# Patient Record
Sex: Male | Born: 1945 | Race: Black or African American | Hispanic: No | Marital: Married | Smoking: Former smoker
Health system: Southern US, Community
[De-identification: ages and names within clinical notes are randomized; demographics above are authoritative.]

## PROBLEM LIST (undated history)

## (undated) DIAGNOSIS — Z8719 Personal history of other diseases of the digestive system: Secondary | ICD-10-CM

## (undated) DIAGNOSIS — R911 Solitary pulmonary nodule: Secondary | ICD-10-CM

## (undated) DIAGNOSIS — D494 Neoplasm of unspecified behavior of bladder: Secondary | ICD-10-CM

## (undated) DIAGNOSIS — S5290XA Unspecified fracture of unspecified forearm, initial encounter for closed fracture: Secondary | ICD-10-CM

## (undated) DIAGNOSIS — Z8669 Personal history of other diseases of the nervous system and sense organs: Secondary | ICD-10-CM

## (undated) DIAGNOSIS — IMO0001 Reserved for inherently not codable concepts without codable children: Secondary | ICD-10-CM

## (undated) DIAGNOSIS — Z5111 Encounter for antineoplastic chemotherapy: Secondary | ICD-10-CM

## (undated) DIAGNOSIS — IMO0002 Reserved for concepts with insufficient information to code with codable children: Secondary | ICD-10-CM

## (undated) DIAGNOSIS — R319 Hematuria, unspecified: Secondary | ICD-10-CM

## (undated) DIAGNOSIS — C801 Malignant (primary) neoplasm, unspecified: Secondary | ICD-10-CM

## (undated) HISTORY — DX: Encounter for antineoplastic chemotherapy: Z51.11

## (undated) HISTORY — PX: ESOPHAGOGASTRODUODENOSCOPY: SHX1529

## (undated) HISTORY — DX: Reserved for concepts with insufficient information to code with codable children: IMO0002

## (undated) HISTORY — DX: Reserved for inherently not codable concepts without codable children: IMO0001

---

## 1991-05-05 HISTORY — PX: OTHER SURGICAL HISTORY: SHX169

## 2008-06-25 ENCOUNTER — Ambulatory Visit: Payer: Self-pay | Admitting: Internal Medicine

## 2008-06-25 DIAGNOSIS — R569 Unspecified convulsions: Secondary | ICD-10-CM

## 2008-07-09 ENCOUNTER — Ambulatory Visit: Payer: Self-pay | Admitting: Gastroenterology

## 2008-07-20 ENCOUNTER — Telehealth: Payer: Self-pay | Admitting: Gastroenterology

## 2010-04-15 ENCOUNTER — Emergency Department (HOSPITAL_COMMUNITY)
Admission: EM | Admit: 2010-04-15 | Discharge: 2010-04-15 | Payer: Self-pay | Source: Home / Self Care | Admitting: Emergency Medicine

## 2010-04-15 ENCOUNTER — Inpatient Hospital Stay (HOSPITAL_COMMUNITY)
Admission: AD | Admit: 2010-04-15 | Discharge: 2010-04-18 | Payer: Self-pay | Source: Home / Self Care | Attending: Internal Medicine | Admitting: Internal Medicine

## 2010-04-15 ENCOUNTER — Ambulatory Visit: Payer: Self-pay | Admitting: Internal Medicine

## 2010-04-16 ENCOUNTER — Encounter: Payer: Self-pay | Admitting: Gastroenterology

## 2010-04-18 ENCOUNTER — Encounter: Payer: Self-pay | Admitting: Gastroenterology

## 2010-06-03 ENCOUNTER — Ambulatory Visit
Admission: RE | Admit: 2010-06-03 | Discharge: 2010-06-03 | Payer: Self-pay | Source: Home / Self Care | Attending: Gastroenterology | Admitting: Gastroenterology

## 2010-06-03 ENCOUNTER — Other Ambulatory Visit: Payer: Self-pay | Admitting: Gastroenterology

## 2010-06-03 ENCOUNTER — Encounter (INDEPENDENT_AMBULATORY_CARE_PROVIDER_SITE_OTHER): Payer: Self-pay | Admitting: *Deleted

## 2010-06-03 LAB — CBC WITH DIFFERENTIAL/PLATELET
Basophils Absolute: 0 10*3/uL (ref 0.0–0.1)
Basophils Relative: 0.6 % (ref 0.0–3.0)
Eosinophils Absolute: 0 10*3/uL (ref 0.0–0.7)
Lymphocytes Relative: 24.2 % (ref 12.0–46.0)
MCHC: 33.5 g/dL (ref 30.0–36.0)
Neutrophils Relative %: 58.9 % (ref 43.0–77.0)
RBC: 3.29 Mil/uL — ABNORMAL LOW (ref 4.22–5.81)
RDW: 17.8 % — ABNORMAL HIGH (ref 11.5–14.6)

## 2010-06-05 NOTE — Procedures (Signed)
Summary: EGD  ENDOSCOPY PROCEDURE REPORT  PATIENT:  Wesley [name], Wesley [name]  MR#:  [id number] BIRTHDATE:   1945-12-09, 64 yrs. old   GENDER:   male ENDOSCOPIST:   Rachael Fee, MD PROCEDURE DATE:  04/16/2010 PROCEDURE:  EGD with biopsy, 43239, EGD for control of bleeding ASA CLASS:   Class II INDICATIONS: melena, anemia MEDICATIONS:    Fentanyl 50 mcg IV, Versed 7 mg IV DESCRIPTION OF PROCEDURE:   After the risks benefits and alternatives of the procedure were thoroughly explained, informed consent was obtained.  The EG-2990i ([id number]) endoscope was introduced through the mouth and advanced to the second portion of the duodenum, without limitations.  The instrument was slowly withdrawn as the mucosa was fully examined.  <<PROCEDUREIMAGES>>          <<OLD IMAGES>> There was a 1cm cratered ulcer in pyloric channel with a small visible, non-bleeding vessel. The mucosa surrounding the ulcer was edmeatous, inflamed, friable. This caused anatomic distortion of pyrloric channel and partial outlet obstruction. The vessel was injected with 3 cc of dilute epineprine and then Bicap Cautery was applied to destroy the vessel (see image008, image010, and image002).  Mild gastritis was found. There was mild, non-specific gastritis. Biopsies taken to check for H. pylori (see image001).  Otherwise the examination was normal (see image005 and image007).    Retroflexed views revealed no abnormalities.    The scope was then withdrawn from the patient and the procedure completed. COMPLICATIONS:   None  ENDOSCOPIC IMPRESSION:  1) Pyloric channel ulcer with visible vessel.  This was treated with epinephrine injection and cautery.  2) Mild gastritis, biopsied to check for H. pylori  3) Otherwise normal examination  RECOMMENDATIONS:  Continue IV PPI twice daily for now.  Clear liquids.  If biopsies show H. pylori, then he will be started on the appropriate antibtiocs.  _______________________________ Rachael Fee, MD

## 2010-06-05 NOTE — Letter (Signed)
Summary: New Patient letter  [facility]  [address] [place], [place] [address]   Phone: [phone]  Fax: [phone]       04/18/2010 MRN: [id number]  [facility] [address] [place], [place]  [address]  Dear Wesley [name],  Welcome to the Gastroenterology Division at [facility].    You are scheduled to see Dr.  Christella Hartigan on 06-03-10 at 10:30am on the 3rd floor at [facility], [address]  We ask that you try to arrive at our office 15 minutes prior to your appointment time to allow for check-in.  We would like you to complete the enclosed self-administered evaluation form prior to your visit and bring it with you on the day of your appointment.  We will review it with you.  Also, please bring a complete list of all your medications or, if you prefer, bring the medication bottles and we will list them.  Please bring your insurance card so that we may make a copy of it.  If your insurance requires a referral to see a specialist, please bring your referral form from your primary care physician.  Co-payments are due at the time of your visit and may be paid by cash, check or credit card.     Your office visit will consist of a consult with your physician (includes a physical exam), any laboratory testing he/she may order, scheduling of any necessary diagnostic testing (e.g. x-ray, ultrasound, CT-scan), and scheduling of a procedure (e.g. Endoscopy, Colonoscopy) if required.  Please allow enough time on your schedule to allow for any/all of these possibilities.    If you cannot keep your appointment, please call [phone] to cancel or reschedule prior to your appointment date.  This allows [place] the opportunity to schedule an appointment for another patient in need of care.  If you do not cancel or reschedule by 5 p.m. the business day prior to your appointment date, you will be charged a $50.00 late cancellation/no-show fee.    Thank you for choosing [facility] for your medical needs.  We appreciate the opportunity to care for you.  Please visit [place] at our website  to learn more about our practice.                     Sincerely,                                                             The Gastroenterology Division

## 2010-06-05 NOTE — Procedures (Signed)
Summary: Upper Endoscopy  Patient: Wesley [name] Note: All result statuses are Final unless otherwise noted.  Tests: (1) Upper Endoscopy (EGD)   EGD Upper Endoscopy       DONE     [facility]     [address]     [place], [place]  [address]           ENDOSCOPY PROCEDURE REPORT           PATIENT:  Cedar, Ditullio  MR#:  [id number]     BIRTHDATE:  06/12/1945, 64 yrs. old  GENDER:  male     ENDOSCOPIST:  Rachael Fee, MD     PROCEDURE DATE:  04/16/2010     PROCEDURE:  EGD with biopsy, 43239, EGD for control of bleeding     ASA CLASS:  Class II     INDICATIONS:  melena, anemia     MEDICATIONS:   Fentanyl 50 mcg IV, Versed 7 mg IV     DESCRIPTION OF PROCEDURE:   After the risks benefits and     alternatives of the procedure were thoroughly explained, informed     consent was obtained.  The EG-2990i ([id number]) endoscope was     introduced through the mouth and advanced to the second portion of     the duodenum, without limitations.  The instrument was slowly     withdrawn as the mucosa was fully examined.           <<PROCEDUREIMAGES>>     There was a 1cm cratered ulcer in pyloric channel with a small     visible, non-bleeding vessel. The mucosa surrounding the ulcer was     edmeatous, inflamed, friable. This caused anatomic distortion of     pyrloric channel and partial outlet obstruction. The vessel was     injected with 3 cc of dilute epineprine and then Bicap Cautery was     applied to destroy the vessel (see image008, image010, and     image002).  Mild gastritis was found. There was mild, non-specific     gastritis. Biopsies taken to check for H. pylori (see image001).     Otherwise the examination was normal (see image005 and image007).     Retroflexed views revealed no abnormalities.    The scope was then     withdrawn from the patient and the procedure completed.     COMPLICATIONS:  None           ENDOSCOPIC IMPRESSION:     1) Pyloric channel ulcer  with visible vessel.  This was treated     with epinephrine injection and cautery.     2) Mild gastritis, biopsied to check for H. pylori     3) Otherwise normal examination           RECOMMENDATIONS:     Continue IV PPI twice daily for now.     Clear liquids.     If biopsies show H. pylori, then he will be started on the     appropriate antibtiocs.           ______________________________     Rachael Fee, MD           n.     eSIGNED:   Rachael Fee at 04/16/2010 12:56 PM           Roselie Skinner, [id number]  Note: An exclamation mark (!) indicates a result that was not dispersed into the flowsheet. Document Creation  Date: 04/16/2010 1:09 PM _______________________________________________________________________  (1) Order result status: Final Collection or observation date-time: 04/16/2010 12:47 Requested date-time:  Receipt date-time:  Reported date-time:  Referring Physician:   Ordering Physician: Rob Bunting [phone]) Specimen Source:  Source: Launa Grill Order Number: [id number] Lab site:

## 2010-06-05 NOTE — Assessment & Plan Note (Signed)
Summary: er fup//ccm   Vital Signs:  Patient profile:   65 year old male Weight:      196 pounds Temp:     98.9 degrees F oral BP sitting:   92 / 68  (left arm) Cuff size:   regular  Vitals Entered By: Duard Brady LPN (April 15, 2010 1:06 PM) CC: c/o dizziness/dehydration  Is Patient Diabetic? No   CC:  c/o dizziness/dehydration .  History of Present Illness: 64 -year-old  male without a  significant past medical history.  He was stable until 3 days ago when he had the onset of nausea and vomiting.  There was some concern about the blood in the emesis.  Due to dizziness, he was evaluated and [facility] the ED last night where he was under to be hypotensive.  He was treated with IV fluids and discharged at approximately a 6 a.m. since his discharge.  He has had some occasional mild the dizziness.  Hemoglobin and hematocrit was 10.129.4.  He stated that he noted a dark stoo  earlier today following his E. D. discharge.  He was seen today as instructed for follow-up. He denies any history of GI bleeding peptic ulcer disease.  He states he has noticed some mild abdominal discomfort that predated his nausea and vomiting this past weekend.  He does describe some mild the right arm pain, but has not been taking any anti-inflammatory medications. Evaluation in the office revealed orthostatic hypotension and hematest positive stool.  He is admitted with suspected upper GI bleeding.  Allergies: No Known Drug Allergies  Past History:  Past Medical History: Reviewed history from 06/25/2008 and no changes required. Seizure disorder- age 11 or 78 hospitalized age 89 for accidental poisoning  Past Surgical History: benign tumor resection, left axillan  Family History: Reviewed history from 06/25/2008 and no changes required. father died age 20.  Complications of cerumen after disease, history of hypertension    Mother died age 80, congestive heart failure  Five brothers; one  brother died of complications of either colon or prostate cancer one brother died of maximal depth at age 56 one brother with a history of mental illness, requiring multiple hospital admissions throughout his life.  Also died a traumatic death one brother with chronic kidney disease, diabetes, on chronic dialysis  One sister is well  Social History: Reviewed history from 06/25/2008 and no changes required. 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  The patient denies fever, weight loss, weight gain, vision loss, decreased hearing, hoarseness, chest pain, syncope, dyspnea on exertion, peripheral edema, prolonged cough, headaches, hemoptysis, severe indigestion/heartburn, hematuria, incontinence, genital sores, suspicious skin lesions, transient blindness, difficulty walking, depression, unusual weight change, abnormal bleeding, enlarged lymph nodes, angioedema, breast masses, and testicular masses.    Physical Exam  General:  Well-developed,well-nourished,in no acute distress; alert,appropriate and cooperative throughout examination; blood pressure 90 /70 Head:  Normocephalic and atraumatic without obvious abnormalities. No apparent alopecia or balding. Eyes:  No corneal or conjunctival inflammation noted. EOMI. Perrla. Funduscopic exam benign, without hemorrhages, exudates or papilledema. Vision grossly normal. Ears:  External ear exam shows no significant lesions or deformities.  Otoscopic examination reveals clear canals, tympanic membranes are intact bilaterally without bulging, retraction, inflammation or discharge. Hearing is grossly normal bilaterally. Mouth:  Oral mucosa and oropharynx without lesions or exudates.  Teeth in good repair. Neck:  No deformities, masses, or tenderness noted. Chest Wall:  No deformities, masses,  tenderness or gynecomastia noted. Lungs:  Normal respiratory effort, chest expands symmetrically. Lungs are clear to auscultation, no crackles or wheezes. Heart:  Normal rate and regular rhythm. S1 and S2 normal without gallop, murmur, click, rub or other extra sounds.  no tachycardia Abdomen:  Bowel sounds positive,abdomen soft and non-tender without masses, organomegaly or hernias noted. Rectal:  very dark, brown hematest positive stool Msk:  No deformity or scoliosis noted of thoracic or lumbar spine.   Pulses:  R and L carotid,radial,femoral,dorsalis pedis and posterior tibial pulses are full and equal bilaterally Extremities:  No clubbing, cyanosis, edema, or deformity noted with normal full range of motion of all joints.   Neurologic:  alert & oriented X3, cranial nerves II-XII intact, sensation intact to light touch, and gait normal.  alert & oriented X3, cranial nerves II-XII intact, sensation intact to light touch, and gait normal.   Skin:  Intact without suspicious lesions or rashes Cervical Nodes:  No lymphadenopathy noted Axillary Nodes:  No palpable lymphadenopathy Inguinal Nodes:  No significant adenopathy Psych:  Cognition and judgment appear intact. Alert and cooperative with normal attention span and concentration. No apparent delusions, illusions, hallucinations   Impression & Recommendations:  Problem # 1:  HYPOTENSION, ORTHOSTATIC (ICD-458.0)  Problem # 2:  ANEMIA (ICD-285.9)  Problem # 3:  GASTROINTESTINAL HEMORRHAGE (ICD-578.9) will admit  to the hospital for further evaluation and treatment; will type and cross and transfuse if necessary will placed at bed rest and order prompt fluid resuscitation.  A GI consultation will be obtained.  Will place on  PPI therapy  Patient Instructions: 1)  report to [facility] admitting immediately for admission to the hospital   Orders Added: 1)  Est. Patient Level V [[address]]

## 2010-06-11 NOTE — Assessment & Plan Note (Signed)
  Review of gastrointestinal problems: 1. Pyloric channel ulcer, Biopsies H. pylori positive.  Presented with melena, hemoglobin 7.07 April 2010. EGD performed.    History of Present Illness Visit Type: Initial Visit Primary GI MD: Rob Bunting MD Primary Provider: Beverely Low, MD Chief Complaint: Post hospital/ Ulcer History of Present Illness:     very pleasant 65 year old man whom I last saw about 6 weeks ago. Since then he completed H. pylori ulcer abx.  He has been on PPI twice daily.  NO more melena.  Feels very well.            Current Medications (verified): 1)  Pantoprazole Sodium 40 Mg Tbec (Pantoprazole Sodium) .... Take 1 Tablet By Mouth Two Times A Day With Meals  Allergies (verified): No Known Drug Allergies  Vital Signs:  Patient profile:   65 year old male Height:      68 inches Weight:      196 pounds BMI:     29.91 Pulse rate:   80 / minute Pulse rhythm:   regular BP sitting:   132 / 84  (left arm) Cuff size:   regular  Vitals Entered By: June McMurray CMA Duncan Dull) (June 03, 2010 10:39 AM)  Physical Exam  Additional Exam:  Constitutional: generally well appearing Psychiatric: alert and oriented times 3 Abdomen: soft, non-tender, non-distended, normal bowel sounds    Impression & Recommendations:  Problem # 1:  gastric ulcer, H. pylori related we will set him up with repeat endoscopy in 2-3 weeks time to confirm ulcer healing. He will stay on proton pump inhibitor once daily for another 2-3 weeks and if the ulcer is clearly improving I think he can stop the PPI.  Patient Instructions: 1)  You will be scheduled to have an upper endoscopy in mid to late February. 2)  Cut back on the protonix to once daily. 3)  The medication list was reviewed and reconciled.  All changed / newly prescribed medications were explained.  A complete medication list was provided to the patient / caregiver.  Appended Document: Orders  Update/EGD    Clinical Lists Changes  Orders: Added new Test order of EGD (EGD) - Signed

## 2010-06-11 NOTE — Letter (Signed)
Summary: EGD Instructions  [facility]  [address] [place], [place] [address]   Phone: [phone]  Fax: [phone]       Wesley [name]    [birth year]    MRN: [id number]       Procedure Day /Date:07/08/10 TUE     Arrival Time: 9 am     Procedure Time:10 am     Location of Procedure:                    [facility] (4th Floor)    PREPARATION FOR ENDOSCOPY   On 07/08/10 THE DAY OF THE PROCEDURE:  1.   No solid foods, milk or milk products are allowed after midnight the night before your procedure.  2.   Do not drink anything colored red or purple.  Avoid juices with pulp.  No orange juice.  3.  You may drink clear liquids until 8 am, which is 2 hours before your procedure.                                                                                                CLEAR LIQUIDS INCLUDE: Water Jello Ice Popsicles Tea (sugar ok, no milk/cream) Powdered fruit flavored drinks Coffee (sugar ok, no milk/cream) Gatorade Juice: apple, white grape, white cranberry  Lemonade Clear bullion, consomm, broth Carbonated beverages (any kind) Strained chicken noodle soup Hard Candy   MEDICATION INSTRUCTIONS  Unless otherwise instructed, you should take regular prescription medications with a small sip of water as early as possible the morning of your procedure.            OTHER INSTRUCTIONS  You will need a responsible adult at least 63 years of age to accompany you and drive you home.   This person must remain in the waiting room during your procedure.  Wear loose fitting clothing that is easily removed.  Leave jewelry and other valuables at home.  However, you may wish to bring a book to read or an iPod/MP3 player to listen to music as you wait for your procedure to start.  Remove all body piercing jewelry and leave at home.  Total time from sign-in until discharge is approximately 2-3 hours.  You should go home directly after your  procedure and rest.  You can resume normal activities the day after your procedure.  The day of your procedure you should not:   Drive   Make legal decisions   Operate machinery   Drink alcohol   Return to work  You will receive specific instructions about eating, activities and medications before you leave.    The above instructions have been reviewed and explained to me by   _______________________    I fully understand and can verbalize these instructions _____________________________ Date _________

## 2010-07-08 ENCOUNTER — Encounter: Payer: Self-pay | Admitting: Gastroenterology

## 2010-07-08 ENCOUNTER — Other Ambulatory Visit (AMBULATORY_SURGERY_CENTER): Payer: BC Managed Care – PPO | Admitting: Gastroenterology

## 2010-07-08 DIAGNOSIS — K259 Gastric ulcer, unspecified as acute or chronic, without hemorrhage or perforation: Secondary | ICD-10-CM

## 2010-07-08 DIAGNOSIS — R933 Abnormal findings on diagnostic imaging of other parts of digestive tract: Secondary | ICD-10-CM

## 2010-07-08 DIAGNOSIS — D1330 Benign neoplasm of unspecified part of small intestine: Secondary | ICD-10-CM

## 2010-07-14 LAB — CBC
HCT: 25 % — ABNORMAL LOW (ref 39.0–52.0)
Hemoglobin: 8.4 g/dL — ABNORMAL LOW (ref 13.0–17.0)
Hemoglobin: 8.4 g/dL — ABNORMAL LOW (ref 13.0–17.0)
MCV: 89.6 fL (ref 78.0–100.0)
RBC: 2.65 MIL/uL — ABNORMAL LOW (ref 4.22–5.81)
RBC: 2.74 MIL/uL — ABNORMAL LOW (ref 4.22–5.81)
RBC: 2.79 MIL/uL — ABNORMAL LOW (ref 4.22–5.81)
WBC: 5.1 10*3/uL (ref 4.0–10.5)
WBC: 5.2 10*3/uL (ref 4.0–10.5)

## 2010-07-14 LAB — HEMOGLOBIN AND HEMATOCRIT, BLOOD
HCT: 24.6 % — ABNORMAL LOW (ref 39.0–52.0)
Hemoglobin: 8.6 g/dL — ABNORMAL LOW (ref 13.0–17.0)

## 2010-07-15 LAB — CROSSMATCH
ABO/RH(D): B POS
Antibody Screen: NEGATIVE
Unit division: 0
Unit division: 0
Unit division: 0

## 2010-07-15 LAB — COMPREHENSIVE METABOLIC PANEL
ALT: 12 U/L (ref 0–53)
AST: 14 U/L (ref 0–37)
AST: 19 U/L (ref 0–37)
Albumin: 3 g/dL — ABNORMAL LOW (ref 3.5–5.2)
Alkaline Phosphatase: 29 U/L — ABNORMAL LOW (ref 39–117)
Alkaline Phosphatase: 37 U/L — ABNORMAL LOW (ref 39–117)
BUN: 31 mg/dL — ABNORMAL HIGH (ref 6–23)
CO2: 23 mEq/L (ref 19–32)
CO2: 25 mEq/L (ref 19–32)
Chloride: 102 mEq/L (ref 96–112)
Chloride: 108 mEq/L (ref 96–112)
Creatinine, Ser: 1.43 mg/dL (ref 0.4–1.5)
GFR calc Af Amer: >60 mL/min (ref >60)
GFR calc Af Amer: >60 mL/min (ref >60)
GFR calc non Af Amer: 50 mL/min — ABNORMAL LOW (ref >60)
GFR calc non Af Amer: >60 mL/min (ref >60)
Potassium: 3.3 mEq/L — ABNORMAL LOW (ref 3.5–5.1)
Potassium: 4.2 mEq/L (ref 3.5–5.1)
Total Bilirubin: 0.7 mg/dL (ref 0.3–1.2)
Total Bilirubin: 1.1 mg/dL (ref 0.3–1.2)

## 2010-07-15 LAB — RAPID URINE DRUG SCREEN, HOSP PERFORMED
Amphetamines: NOT DETECTED
Barbiturates: NOT DETECTED
Benzodiazepines: NOT DETECTED
Cocaine: NOT DETECTED
Opiates: NOT DETECTED

## 2010-07-15 LAB — CBC
HCT: 29.4 % — ABNORMAL LOW (ref 39.0–52.0)
Hemoglobin: 10.1 g/dL — ABNORMAL LOW (ref 13.0–17.0)
Hemoglobin: 7.2 g/dL — ABNORMAL LOW (ref 13.0–17.0)
MCH: 31.2 pg (ref 26.0–34.0)
MCH: 31.9 pg (ref 26.0–34.0)
MCHC: 34.4 g/dL (ref 30.0–36.0)
Platelets: 135 10*3/uL — ABNORMAL LOW (ref 150–400)
RBC: 2.31 MIL/uL — ABNORMAL LOW (ref 4.22–5.81)
WBC: 5.4 10*3/uL (ref 4.0–10.5)

## 2010-07-15 LAB — DIFFERENTIAL
Basophils Absolute: 0 10*3/uL (ref 0.0–0.1)
Basophils Absolute: 0 10*3/uL (ref 0.0–0.1)
Basophils Relative: 0 % (ref 0–1)
Basophils Relative: 0 % (ref 0–1)
Eosinophils Absolute: 0 10*3/uL (ref 0.0–0.7)
Eosinophils Absolute: 0 10*3/uL (ref 0.0–0.7)
Eosinophils Relative: 0 % (ref 0–5)
Eosinophils Relative: 0 % (ref 0–5)
Lymphocytes Relative: 23 % (ref 12–46)
Lymphocytes Relative: 8 % — ABNORMAL LOW (ref 12–46)
Monocytes Absolute: 0.7 10*3/uL (ref 0.1–1.0)

## 2010-07-15 LAB — GLUCOSE, CAPILLARY
Glucose-Capillary: 122 mg/dL — ABNORMAL HIGH (ref 70–99)
Glucose-Capillary: 128 mg/dL — ABNORMAL HIGH (ref 70–99)

## 2010-07-15 LAB — BASIC METABOLIC PANEL
BUN: 36 mg/dL — ABNORMAL HIGH (ref 6–23)
CO2: 23 mEq/L (ref 19–32)
Calcium: 8 mg/dL — ABNORMAL LOW (ref 8.4–10.5)
Chloride: 110 mEq/L (ref 96–112)
Creatinine, Ser: 1.08 mg/dL (ref 0.4–1.5)
GFR calc Af Amer: >60 mL/min (ref >60)
Glucose, Bld: 117 mg/dL — ABNORMAL HIGH (ref 70–99)

## 2010-07-15 LAB — ABO/RH: ABO/RH(D): B POS

## 2010-07-15 LAB — LIPASE, BLOOD: Lipase: 27 U/L (ref 11–59)

## 2010-07-15 LAB — URINALYSIS, ROUTINE W REFLEX MICROSCOPIC
Bilirubin Urine: NEGATIVE
Ketones, ur: NEGATIVE mg/dL
Nitrite: NEGATIVE
Protein, ur: NEGATIVE mg/dL
Specific Gravity, Urine: 1.02 (ref 1.005–1.030)
Urobilinogen, UA: 1 mg/dL (ref 0.0–1.0)

## 2010-07-15 LAB — HEMOGLOBIN AND HEMATOCRIT, BLOOD
HCT: 22.3 % — ABNORMAL LOW (ref 39.0–52.0)
HCT: 26.4 % — ABNORMAL LOW (ref 39.0–52.0)
Hemoglobin: 8 g/dL — ABNORMAL LOW (ref 13.0–17.0)
Hemoglobin: 9.5 g/dL — ABNORMAL LOW (ref 13.0–17.0)
Hemoglobin: 9.8 g/dL — ABNORMAL LOW (ref 13.0–17.0)

## 2010-07-15 LAB — URINE MICROSCOPIC-ADD ON

## 2010-07-15 NOTE — Procedures (Signed)
Summary: Upper Endoscopy  Patient: Wesley [name] Note: All result statuses are Final unless otherwise noted.  Tests: (1) Upper Endoscopy (EGD)   EGD Upper Endoscopy       DONE     [facility]     [address]     [place], [place]  [address]          ENDOSCOPY PROCEDURE REPORT          PATIENT:  [name], [name]  MR#:  [id number]     BIRTHDATE:  09/03/1945, 64 yrs. old  GENDER:  male     ENDOSCOPIST:  Rachael Fee, MD     PROCEDURE DATE:  07/08/2010     PROCEDURE:  EGD, diagnostic [phone]     ASA CLASS:  Class II     INDICATIONS:  pyloric channel ulcer 04/2010; H. pylori +; was     bleeding     MEDICATIONS:  Fentanyl 50 mcg IV, Versed 7 mg IV     TOPICAL ANESTHETIC:  Exactacain Spray          DESCRIPTION OF PROCEDURE:   After the risks benefits and     alternatives of the procedure were thoroughly explained, informed     consent was obtained.  The LB [id number] [id number] endoscope was     introduced through the mouth and advanced to the second portion of     the duodenum, without limitations.  The instrument was slowly     withdrawn as the mucosa was fully examined.     <<PROCEDUREIMAGES>>     The pyloric channel was anatomically distorted, somewhat     edematous. 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 The     previously seen bleeding pyloric channel ulcer has healed (see     image6 and image5).  Otherwise the examination was normal (see     image3, image2, image1, and image7).    Retroflexed views revealed     no abnormalities.    The scope was then withdrawn from the patient     and the procedure completed.     COMPLICATIONS:  None          ENDOSCOPIC IMPRESSION:     1) Anatomic distortion in pylorus     2) Otherwise normal examination          RECOMMENDATIONS:     OK to stop the protonix, take as needed for GERD symptoms          ______________________________     Rachael Fee, MD          n.     eSIGNED:   Rachael Fee at  07/08/2010 10:21 AM          Roselie Skinner, [phone]  Note: An exclamation mark (!) indicates a result that was not dispersed into the flowsheet. Document Creation Date: 07/08/2010 10:21 AM _______________________________________________________________________  (1) Order result status: Final Collection or observation date-time: 07/08/2010 10:13 Requested date-time:  Receipt date-time:  Reported date-time:  Referring Physician:   Ordering Physician: Rob Bunting [phone]) Specimen Source:  Source: Launa Grill Order Number: [id number] Lab site:

## 2011-09-14 ENCOUNTER — Encounter: Payer: Self-pay | Admitting: Internal Medicine

## 2011-09-14 ENCOUNTER — Ambulatory Visit (INDEPENDENT_AMBULATORY_CARE_PROVIDER_SITE_OTHER): Payer: BC Managed Care – PPO | Admitting: Internal Medicine

## 2011-09-14 VITALS — BP 122/80 | Temp 97.8°F | Wt 199.0 lb

## 2011-09-14 DIAGNOSIS — L923 Foreign body granuloma of the skin and subcutaneous tissue: Secondary | ICD-10-CM

## 2011-09-14 DIAGNOSIS — M795 Residual foreign body in soft tissue: Secondary | ICD-10-CM

## 2011-09-14 NOTE — Patient Instructions (Signed)
Call or return to clinic as needed  if these symptoms worsen or fail to improve as anticipated.  Keep area clean and dry; leave bandage in place for 24 hrs;  Apply antibiotic cream and wrap daily Call for increase pain, redness or drainage

## 2011-09-14 NOTE — Progress Notes (Signed)
  Subjective:    Patient ID: Wesley [name], male    DOB: 07-22-1945, 66 y.o.   MRN: [id number]  HPI  66 y/o patient with 5 weeks h/o splinter in l index finger    Review of Systems  Skin: Positive for rash.       Objective:   Physical Exam  Skin:       1 cm inflam nodule l index finger          Assessment & Plan:   Foreign body reaction l index finger.  After local anesthesia, nodule excised and area explored; after excision, area bleed freely and treated  With electrocautery;  Pressure dressing applied lightly. Sound care discussed

## 2013-03-13 ENCOUNTER — Encounter: Payer: Self-pay | Admitting: Internal Medicine

## 2013-03-13 ENCOUNTER — Ambulatory Visit (INDEPENDENT_AMBULATORY_CARE_PROVIDER_SITE_OTHER): Payer: BC Managed Care – PPO | Admitting: Internal Medicine

## 2013-03-13 VITALS — BP 142/90 | HR 81 | Temp 98.2°F | Resp 20 | Wt 200.0 lb

## 2013-03-13 DIAGNOSIS — Z23 Encounter for immunization: Secondary | ICD-10-CM

## 2013-03-13 DIAGNOSIS — M542 Cervicalgia: Secondary | ICD-10-CM

## 2013-03-13 MED ORDER — METHYLPREDNISOLONE ACETATE 80 MG/ML IJ SUSP
80.0000 mg | Freq: Once | INTRAMUSCULAR | Status: AC
  Administered 2013-03-13: 80 mg via INTRAMUSCULAR

## 2013-03-13 MED ORDER — CYCLOBENZAPRINE HCL 10 MG PO TABS: 10.0000 mg | ORAL_TABLET | Freq: Three times a day (TID) | ORAL | Status: DC | PRN

## 2013-03-13 MED ORDER — MELOXICAM 15 MG PO TABS: 15.0000 mg | ORAL_TABLET | Freq: Every day | ORAL | Status: DC

## 2013-03-13 NOTE — Progress Notes (Signed)
  Subjective:    Patient ID: Wesley [name], male    DOB: [birth year], 67 y.o.   MRN: [id number]  HPI  Pre-visit discussion using our clinic review tool. No additional management support is needed unless otherwise documented below in the visit note.   67 year old patient who is seen today with a chief complaint of right posterior neck pain of 7 days duration. This began after doing some unusual exertional activities. Pain is maximal in the right posterior neck and most aggravated by head turning to the right. No radicular symptoms. He describes some diffuse tightness and the entire posterior neck region. Symptoms are worse in the morning when he first awakens. He has been using anti-inflammatories with marginal results. No prior history of neck pain  History reviewed. No pertinent past medical history.  History   Social History  . Marital Status: Married    Spouse Name: N/A    Number of Children: N/A  . Years of Education: N/A   Occupational History  . Not on file.   Social History Main Topics  . Smoking status: Never Smoker   . Smokeless tobacco: Never Used  . Alcohol Use: No  . Drug Use: No  . Sexual Activity: Not on file   Other Topics Concern  . Not on file   Social History Narrative  . No narrative on file    History reviewed. No pertinent past surgical history.  No family history on file.  No Known Allergies  No current outpatient prescriptions on file prior to visit.   No current facility-administered medications on file prior to visit.    BP 142/90  Pulse 81  Temp(Src) 98.2 F (36.8 C) (Oral)  Resp 20  Wt 200 lb (90.719 kg)  SpO2 97%       Review of Systems  Constitutional: Negative for fever, chills, appetite change and fatigue.  HENT: Negative for congestion, dental problem, ear pain, hearing loss, sore throat, tinnitus, trouble swallowing and voice change.   Eyes: Negative for pain, discharge and visual disturbance.  Respiratory: Negative for  cough, chest tightness, wheezing and stridor.   Cardiovascular: Negative for chest pain, palpitations and leg swelling.  Gastrointestinal: Negative for nausea, vomiting, abdominal pain, diarrhea, constipation, blood in stool and abdominal distention.  Genitourinary: Negative for urgency, hematuria, flank pain, discharge, difficulty urinating and genital sores.  Musculoskeletal: Negative for arthralgias, back pain, gait problem, joint swelling, myalgias and neck stiffness.       Right posterior neck pain  Skin: Negative for rash.  Neurological: Negative for dizziness, syncope, speech difficulty, weakness, numbness and headaches.  Hematological: Negative for adenopathy. Does not bruise/bleed easily.  Psychiatric/Behavioral: Negative for behavioral problems and dysphoric mood. The patient is not nervous/anxious.        Objective:   Physical Exam  Constitutional: He appears well-developed and well-nourished.  Blood pressure 130/90   Neck:  Fairly full range of motion Pain aggravated by head turning to the right           Assessment & Plan:    Neck pain. Probable cervical strain. We'll treat with anti-inflammatories and muscle relaxers. Depo-Medrol 80 mg IM

## 2013-03-13 NOTE — Patient Instructions (Signed)
You  may move around, but avoid painful motions and activities.  Apply heat  to the sore area for 15 to 20 minutes 3 or 4 times daily for the next two to 3 days.Cervical Strain and Sprain (Whiplash) with Rehab Cervical strain and sprains are injuries that commonly occur with "whiplash" injuries. Whiplash occurs when the neck is forcefully whipped backward or forward, such as during a motor vehicle accident. The muscles, ligaments, tendons, discs and nerves of the neck are susceptible to injury when this occurs. SYMPTOMS   Pain or stiffness in the front and/or back of neck  Symptoms may present immediately or up to 24 hours after injury.  Dizziness, headache, nausea and vomiting.  Muscle spasm with soreness and stiffness in the neck.  Tenderness and swelling at the injury site. CAUSES  Whiplash injuries often occur during contact sports or motor vehicle accidents.  RISK INCREASES WITH:  Osteoarthritis of the spine.  Situations that make head or neck accidents or trauma more likely.  High-risk sports (football, rugby, wrestling, hockey, auto racing, gymnastics, diving, contact karate or boxing).  Poor strength and flexibility of the neck.  Previous neck injury.  Poor tackling technique.  Improperly fitted or padded equipment. PREVENTION  Learn and use proper technique (avoid tackling with the head, spearing and head-butting; use proper falling techniques to avoid landing on the head).  Warm up and stretch properly before activity.  Maintain physical fitness:  Strength, flexibility and endurance.  Cardiovascular fitness.  Wear properly fitted and padded protective equipment, such as padded soft collars, for participation in contact sports. PROGNOSIS  Recovery for cervical strain and sprain injuries is dependent on the extent of the injury. These injuries are usually curable in 1 week to 3 months with appropriate treatment.  RELATED COMPLICATIONS   Temporary numbness and  weakness may occur if the nerve roots are damaged, and this may persist until the nerve has completely healed.  Chronic pain due to frequent recurrence of symptoms.  Prolonged healing, especially if activity is resumed too soon (before complete recovery). TREATMENT  Treatment initially involves the use of ice and medication to help reduce pain and inflammation. It is also important to perform strengthening and stretching exercises and modify activities that worsen symptoms so the injury does not get worse. These exercises may be performed at home or with a therapist. For patients who experience severe symptoms, a soft padded collar may be recommended to be worn around the neck.  Improving your posture may help reduce symptoms. Posture improvement includes pulling your chin and abdomen in while sitting or standing. If you are sitting, sit in a firm chair with your buttocks against the back of the chair. While sleeping, try replacing your pillow with a small towel rolled to 2 inches in diameter, or use a cervical pillow or soft cervical collar. Poor sleeping positions delay healing.  For patients with nerve root damage, which causes numbness or weakness, the use of a cervical traction apparatus may be recommended. Surgery is rarely necessary for these injuries. However, cervical strain and sprains that are present at birth (congenital) may require surgery. MEDICATION   If pain medication is necessary, nonsteroidal anti-inflammatory medications, such as aspirin and ibuprofen, or other minor pain relievers, such as acetaminophen, are often recommended.  Do not take pain medication for 7 days before surgery.  Prescription pain relievers may be given if deemed necessary by your caregiver. Use only as directed and only as much as you need. HEAT AND COLD:  Cold treatment (icing) relieves pain and reduces inflammation. Cold treatment should be applied for 10 to 15 minutes every 2 to 3 hours for  inflammation and pain and immediately after any activity that aggravates your symptoms. Use ice packs or an ice massage.  Heat treatment may be used prior to performing the stretching and strengthening activities prescribed by your caregiver, physical therapist, or athletic trainer. Use a heat pack or a warm soak. SEEK MEDICAL CARE IF:   Symptoms get worse or do not improve in 2 weeks despite treatment.  New, unexplained symptoms develop (drugs used in treatment may produce side effects). EXERCISES RANGE OF MOTION (ROM) AND STRETCHING EXERCISES - Cervical Strain and Sprain These exercises may help you when beginning to rehabilitate your injury. In order to successfully resolve your symptoms, you must improve your posture. These exercises are designed to help reduce the forward-head and rounded-shoulder posture which contributes to this condition. Your symptoms may resolve with or without further involvement from your physician, physical therapist or athletic trainer. While completing these exercises, remember:   Restoring tissue flexibility helps normal motion to return to the joints. This allows healthier, less painful movement and activity.  An effective stretch should be held for at least 20 seconds, although you may need to begin with shorter hold times for comfort.  A stretch should never be painful. You should only feel a gentle lengthening or release in the stretched tissue. STRETCH- Axial Extensors  Lie on your back on the floor. You may bend your knees for comfort. Place a rolled up hand towel or dish towel, about 2 inches in diameter, under the part of your head that makes contact with the floor.  Gently tuck your chin, as if trying to make a "double chin," until you feel a gentle stretch at the base of your head.  Hold __________ seconds. Repeat __________ times. Complete this exercise __________ times per day.  STRETECH - Axial Extension   Stand or sit on a firm surface. Assume  a good posture: chest up, shoulders drawn back, abdominal muscles slightly tense, knees unlocked (if standing) and feet hip width apart.  Slowly retract your chin so your head slides back and your chin slightly lowers.Continue to look straight ahead.  You should feel a gentle stretch in the back of your head. Be certain not to feel an aggressive stretch since this can cause headaches later.  Hold for __________ seconds. Repeat __________ times. Complete this exercise __________ times per day. STRETCH  Cervical Side Bend   Stand or sit on a firm surface. Assume a good posture: chest up, shoulders drawn back, abdominal muscles slightly tense, knees unlocked (if standing) and feet hip width apart.  Without letting your nose or shoulders move, slowly tip your right / left ear to your shoulder until your feel a gentle stretch in the muscles on the opposite side of your neck.  Hold __________ seconds. Repeat __________ times. Complete this exercise __________ times per day. STRETCH  Cervical Rotators   Stand or sit on a firm surface. Assume a good posture: chest up, shoulders drawn back, abdominal muscles slightly tense, knees unlocked (if standing) and feet hip width apart.  Keeping your eyes level with the ground, slowly turn your head until you feel a gentle stretch along the back and opposite side of your neck.  Hold __________ seconds. Repeat __________ times. Complete this exercise __________ times per day. RANGE OF MOTION - Neck Circles   Stand or sit on a  firm surface. Assume a good posture: chest up, shoulders drawn back, abdominal muscles slightly tense, knees unlocked (if standing) and feet hip width apart.  Gently roll your head down and around from the back of one shoulder to the back of the other. The motion should never be forced or painful.  Repeat the motion 10-20 times, or until you feel the neck muscles relax and loosen. Repeat __________ times. Complete the exercise  __________ times per day. STRENGTHENING EXERCISES - Cervical Strain and Sprain These exercises may help you when beginning to rehabilitate your injury. They may resolve your symptoms with or without further involvement from your physician, physical therapist or athletic trainer. While completing these exercises, remember:   Muscles can gain both the endurance and the strength needed for everyday activities through controlled exercises.  Complete these exercises as instructed by your physician, physical therapist or athletic trainer. Progress the resistance and repetitions only as guided.  You may experience muscle soreness or fatigue, but the pain or discomfort you are trying to eliminate should never worsen during these exercises. If this pain does worsen, stop and make certain you are following the directions exactly. If the pain is still present after adjustments, discontinue the exercise until you can discuss the trouble with your clinician. STRENGTH Cervical Flexors, Isometric  Face a wall, standing about 6 inches away. Place a small pillow, a ball about 6-8 inches in diameter, or a folded towel between your forehead and the wall.  Slightly tuck your chin and gently push your forehead into the soft object. Push only with mild to moderate intensity, building up tension gradually. Keep your jaw and forehead relaxed.  Hold 10 to 20 seconds. Keep your breathing relaxed.  Release the tension slowly. Relax your neck muscles completely before you start the next repetition. Repeat __________ times. Complete this exercise __________ times per day. STRENGTH- Cervical Lateral Flexors, Isometric   Stand about 6 inches away from a wall. Place a small pillow, a ball about 6-8 inches in diameter, or a folded towel between the side of your head and the wall.  Slightly tuck your chin and gently tilt your head into the soft object. Push only with mild to moderate intensity, building up tension gradually.  Keep your jaw and forehead relaxed.  Hold 10 to 20 seconds. Keep your breathing relaxed.  Release the tension slowly. Relax your neck muscles completely before you start the next repetition. Repeat __________ times. Complete this exercise __________ times per day. STRENGTH  Cervical Extensors, Isometric   Stand about 6 inches away from a wall. Place a small pillow, a ball about 6-8 inches in diameter, or a folded towel between the back of your head and the wall.  Slightly tuck your chin and gently tilt your head back into the soft object. Push only with mild to moderate intensity, building up tension gradually. Keep your jaw and forehead relaxed.  Hold 10 to 20 seconds. Keep your breathing relaxed.  Release the tension slowly. Relax your neck muscles completely before you start the next repetition. Repeat __________ times. Complete this exercise __________ times per day. POSTURE AND BODY MECHANICS CONSIDERATIONS - Cervical Strain and Sprain Keeping correct posture when sitting, standing or completing your activities will reduce the stress put on different body tissues, allowing injured tissues a chance to heal and limiting painful experiences. The following are general guidelines for improved posture. Your physician or physical therapist will provide you with any instructions specific to your needs. While reading these  guidelines, remember:  The exercises prescribed by your provider will help you have the flexibility and strength to maintain correct postures.  The correct posture provides the optimal environment for your joints to work. All of your joints have less wear and tear when properly supported by a spine with good posture. This means you will experience a healthier, less painful body.  Correct posture must be practiced with all of your activities, especially prolonged sitting and standing. Correct posture is as important when doing repetitive low-stress activities (typing) as it is  when doing a single heavy-load activity (lifting). PROLONGED STANDING WHILE SLIGHTLY LEANING FORWARD When completing a task that requires you to lean forward while standing in one place for a long time, place either foot up on a stationary 2-4 inch high object to help maintain the best posture. When both feet are on the ground, the low back tends to lose its slight inward curve. If this curve flattens (or becomes too large), then the back and your other joints will experience too much stress, fatigue more quickly and can cause pain.  RESTING POSITIONS Consider which positions are most painful for you when choosing a resting position. If you have pain with flexion-based activities (sitting, bending, stooping, squatting), choose a position that allows you to rest in a less flexed posture. You would want to avoid curling into a fetal position on your side. If your pain worsens with extension-based activities (prolonged standing, working overhead), avoid resting in an extended position such as sleeping on your stomach. Most people will find more comfort when they rest with their spine in a more neutral position, neither too rounded nor too arched. Lying on a non-sagging bed on your side with a pillow between your knees, or on your back with a pillow under your knees will often provide some relief. Keep in mind, being in any one position for a prolonged period of time, no matter how correct your posture, can still lead to stiffness. WALKING Walk with an upright posture. Your ears, shoulders and hips should all line-up. OFFICE WORK When working at a desk, create an environment that supports good, upright posture. Without extra support, muscles fatigue and lead to excessive strain on joints and other tissues. CHAIR:  A chair should be able to slide under your desk when your back makes contact with the back of the chair. This allows you to work closely.  The chair's height should allow your eyes to be level  with the upper part of your monitor and your hands to be slightly lower than your elbows.  Body position:  Your feet should make contact with the floor. If this is not possible, use a foot rest.  Keep your ears over your shoulders. This will reduce stress on your neck and low back. Document Released: 04/20/2005 Document Revised: 08/15/2012 Document Reviewed: 08/02/2008 [facility] Patient Information 2014 [place], [place].

## 2013-09-04 ENCOUNTER — Other Ambulatory Visit (INDEPENDENT_AMBULATORY_CARE_PROVIDER_SITE_OTHER): Payer: Medicare HMO

## 2013-09-04 DIAGNOSIS — Z0000 Encounter for general adult medical examination without abnormal findings: Secondary | ICD-10-CM

## 2013-09-04 DIAGNOSIS — Z136 Encounter for screening for cardiovascular disorders: Secondary | ICD-10-CM

## 2013-09-04 DIAGNOSIS — R319 Hematuria, unspecified: Secondary | ICD-10-CM

## 2013-09-04 LAB — CBC WITH DIFFERENTIAL/PLATELET
BASOS PCT: 0.5 % (ref 0.0–3.0)
Basophils Absolute: 0 10*3/uL (ref 0.0–0.1)
EOS ABS: 0.1 10*3/uL (ref 0.0–0.7)
Eosinophils Relative: 1.9 % (ref 0.0–5.0)
HEMATOCRIT: 34.8 % — AB (ref 39.0–52.0)
HEMOGLOBIN: 11.6 g/dL — AB (ref 13.0–17.0)
LYMPHS ABS: 1 10*3/uL (ref 0.7–4.0)
Lymphocytes Relative: 32.4 % (ref 12.0–46.0)
MCHC: 33.3 g/dL (ref 30.0–36.0)
MCV: 95.8 fl (ref 78.0–100.0)
MONO ABS: 0.6 10*3/uL (ref 0.1–1.0)
Monocytes Relative: 19.1 % — ABNORMAL HIGH (ref 3.0–12.0)
NEUTROS ABS: 1.4 10*3/uL (ref 1.4–7.7)
Neutrophils Relative %: 46.1 % (ref 43.0–77.0)
Platelets: 170 10*3/uL (ref 150.0–400.0)
RBC: 3.63 Mil/uL — AB (ref 4.22–5.81)
RDW: 18.3 % — ABNORMAL HIGH (ref 11.5–14.6)
WBC: 3 10*3/uL — ABNORMAL LOW (ref 4.5–10.5)

## 2013-09-04 LAB — POCT URINALYSIS DIPSTICK
Bilirubin, UA: NEGATIVE
GLUCOSE UA: NEGATIVE
Ketones, UA: NEGATIVE
Leukocytes, UA: NEGATIVE
Nitrite, UA: NEGATIVE
SPEC GRAV UA: 1.015
UROBILINOGEN UA: 0.2
pH, UA: 7

## 2013-09-04 LAB — HEPATIC FUNCTION PANEL
ALBUMIN: 4 g/dL (ref 3.5–5.2)
ALK PHOS: 48 U/L (ref 39–117)
ALT: 16 U/L (ref 0–53)
AST: 20 U/L (ref 0–37)
Bilirubin, Direct: 0.2 mg/dL (ref 0.0–0.3)
Total Bilirubin: 1.3 mg/dL — ABNORMAL HIGH (ref 0.3–1.2)
Total Protein: 8.1 g/dL (ref 6.0–8.3)

## 2013-09-04 LAB — BASIC METABOLIC PANEL
BUN: 10 mg/dL (ref 6–23)
CHLORIDE: 102 meq/L (ref 96–112)
CO2: 29 mEq/L (ref 19–32)
CREATININE: 0.9 mg/dL (ref 0.4–1.5)
Calcium: 9.3 mg/dL (ref 8.4–10.5)
GFR: 116.04 mL/min (ref >60.00)
Glucose, Bld: 95 mg/dL (ref 70–99)
Potassium: 4.2 mEq/L (ref 3.5–5.1)
Sodium: 136 mEq/L (ref 135–145)

## 2013-09-04 LAB — LIPID PANEL
CHOLESTEROL: 158 mg/dL (ref 0–200)
HDL: 71.7 mg/dL (ref >39.00)
LDL CALC: 71 mg/dL (ref 0–99)
Total CHOL/HDL Ratio: 2
Triglycerides: 76 mg/dL (ref 0.0–149.0)
VLDL: 15.2 mg/dL (ref 0.0–40.0)

## 2013-09-04 LAB — PSA: PSA: 2.97 ng/mL (ref 0.10–4.00)

## 2013-09-04 LAB — TSH: TSH: 2.69 u[IU]/mL (ref 0.35–5.50)

## 2013-09-11 ENCOUNTER — Encounter: Payer: Self-pay | Admitting: Internal Medicine

## 2013-09-11 ENCOUNTER — Ambulatory Visit (INDEPENDENT_AMBULATORY_CARE_PROVIDER_SITE_OTHER): Payer: Medicare HMO | Admitting: Internal Medicine

## 2013-09-11 VITALS — BP 130/90 | HR 62 | Temp 98.2°F | Resp 20 | Ht 67.5 in | Wt 199.0 lb

## 2013-09-11 DIAGNOSIS — R569 Unspecified convulsions: Secondary | ICD-10-CM

## 2013-09-11 DIAGNOSIS — Z23 Encounter for immunization: Secondary | ICD-10-CM

## 2013-09-11 DIAGNOSIS — K922 Gastrointestinal hemorrhage, unspecified: Secondary | ICD-10-CM

## 2013-09-11 DIAGNOSIS — R319 Hematuria, unspecified: Secondary | ICD-10-CM

## 2013-09-11 DIAGNOSIS — Z0000 Encounter for general adult medical examination without abnormal findings: Secondary | ICD-10-CM

## 2013-09-11 DIAGNOSIS — I951 Orthostatic hypotension: Secondary | ICD-10-CM

## 2013-09-11 NOTE — Progress Notes (Signed)
Pre-visit discussion using our clinic review tool. No additional management support is needed unless otherwise documented below in the visit note.  

## 2013-09-11 NOTE — Patient Instructions (Signed)
Limit your sodium (Salt) intake    It is important that you exercise regularly, at least 20 minutes 3 to 4 times per week.  If you develop chest pain or shortness of breath seek  medical attention.  Please check your blood pressure on a regular basis.  If it is consistently greater than 150/90, please make an office appointment.  Return in 6 months for follow-up  Urology appointment as discussed  Schedule your colonoscopy to help detect colon cancer.

## 2013-09-11 NOTE — Progress Notes (Signed)
Subjective:    Patient ID: Wesley [name], male    DOB: [birth year], 68 y.o.   MRN: [id number]  HPI   68 year old patient who is seen today for a wellness exam  Allergies:  No Known Drug Allergies   Past History:  Past Medical History:   Seizure disorder- age 68 or 68  hospitalized age 68 for accidental poisoning  UGI bleed secondary to gastric ulcer December 2011   Past Surgical History:  benign tumor resection,   status post EGD No prior colonoscopies  Family History:   father died age 70. Complications of cerumen after disease, history of hypertension  Mother died age 66, congestive heart failure  Five brothers;  one brother died of complications of either colon or prostate cancer  one brother died of maximal depth at age 66  one brother with a history of mental illness, requiring multiple hospital admissions throughout his life. 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 No pertinent past medical history.  History   Social History  . Marital Status: Married    Spouse Name: N/A    Number of Children: N/A  . Years of Education: N/A   Occupational History  . Not on file.   Social History Main Topics  . Smoking status: Never Smoker   . Smokeless tobacco: Never Used  . Alcohol Use: No  . Drug Use: No  . Sexual Activity: Not on file   Other Topics Concern  . Not on file   Social History Narrative  . No narrative on file    History reviewed. No pertinent past surgical history.  No family history on file.  No Known Allergies  No current outpatient prescriptions on file prior to visit.   No current facility-administered medications on file prior to visit.    BP 130/90  Pulse 62  Temp(Src) 98.2 F (36.8 C) (Oral)  Resp 20  Ht 5'  7.5" (1.715 m)  Wt 199 lb (90.266 kg)  BMI 30.69 kg/m2  SpO2 98%  1. Risk factors, based on past  M,S,F history  no significant cardiovascular risk factors   2.  Physical activities:, walks daily.  Mows  grass.  Goes to the [facility] 4 times per week   3.  Depression/mood:, no history of depression or mood disorder  4.  Hearing:No deficits   5.  ADL's: independent in all aspect of daily living  6.  Fall risk: low   7.  Home safety: no problems identified  8.  Height weight, and visual acuity;, height and weight stable.  No change in visual acuity  9.  Counseling: heart healthy diet  Modest weight loss encouraged  10. Lab orders based on risk factors: laboratory studies reviewed   11. Referral : referred to GI for colonoscopy and urology to evaluate hematuria   12. Care plan:as above   13. Cognitive assessment:  alert and oriented.  Normal affect.  No cognitive dysfunction      Review of Systems  Constitutional: Negative for fever, chills, activity change, appetite change and fatigue.  HENT: Negative for congestion, dental problem, ear pain, hearing loss, mouth sores, rhinorrhea, sinus pressure, sneezing, tinnitus, trouble swallowing and voice change.   Eyes: Negative for photophobia, pain, redness and  visual disturbance.  Respiratory: Negative for apnea, cough, choking, chest tightness, shortness of breath and wheezing.   Cardiovascular: Negative for chest pain, palpitations and leg swelling.  Gastrointestinal: Negative for nausea, vomiting, abdominal pain, diarrhea, constipation, blood in stool, abdominal distention, anal bleeding and rectal pain.  Genitourinary: Positive for hematuria (gross hematuria intermittently for 2 years). Negative for dysuria, urgency, frequency, flank pain, decreased urine volume, discharge, penile swelling, scrotal swelling, difficulty urinating, genital sores and testicular pain.  Musculoskeletal: Negative for arthralgias, back pain, gait problem,  joint swelling, myalgias, neck pain and neck stiffness.  Skin: Negative for color change, rash and wound.  Neurological: Negative for dizziness, tremors, seizures, syncope, facial asymmetry, speech difficulty, weakness, light-headedness, numbness and headaches.  Hematological: Negative for adenopathy. Does not bruise/bleed easily.  Psychiatric/Behavioral: Negative for suicidal ideas, hallucinations, behavioral problems, confusion, sleep disturbance, self-injury, dysphoric mood, decreased concentration and agitation. The patient is not nervous/anxious.        Objective:   Physical Exam  Constitutional: He appears well-developed and well-nourished.  HENT:  Head: Normocephalic and atraumatic.  Right Ear: External ear normal.  Left Ear: External ear normal.  Nose: Nose normal.  Mouth/Throat: Oropharynx is clear and moist.  Dentures in place  Eyes: Conjunctivae and EOM are normal. Pupils are equal, round, and reactive to light. No scleral icterus.  Arcus senilis  Neck: Normal range of motion. Neck supple. No JVD present. No thyromegaly present.  Cardiovascular: Regular rhythm, normal heart sounds and intact distal pulses.  Exam reveals no gallop and no friction rub.   No murmur heard. Pulmonary/Chest: Effort normal and breath sounds normal. He exhibits no tenderness.  Abdominal: Soft. Bowel sounds are normal. He exhibits no distension and no mass. There is no tenderness.  Genitourinary: Prostate normal and penis normal.  Musculoskeletal: Normal range of motion. He exhibits no edema and no tenderness.  Bunions present left side, more prominent  Lymphadenopathy:    He has no cervical adenopathy.  Neurological: He is alert. He has normal reflexes. No cranial nerve deficit. Coordination normal.  Skin: Skin is warm and dry. No rash noted.  Psychiatric: He has a normal mood and affect. His behavior is normal.          Assessment & Plan:   Preventive health exam- colonoscopy Hematuria  refer Urology  Return in 6 months for follow-up  Check blood pressure on a regular basis.  If it is consistently greater than 150/90, please make an office appointment.

## 2013-10-05 ENCOUNTER — Institutional Professional Consult (permissible substitution): Payer: Medicare HMO | Admitting: Pulmonary Disease

## 2013-10-16 ENCOUNTER — Encounter: Payer: Self-pay | Admitting: Internal Medicine

## 2013-10-16 ENCOUNTER — Other Ambulatory Visit: Payer: Self-pay | Admitting: Urology

## 2013-10-19 ENCOUNTER — Ambulatory Visit (INDEPENDENT_AMBULATORY_CARE_PROVIDER_SITE_OTHER): Payer: Medicare HMO | Admitting: Pulmonary Disease

## 2013-10-19 ENCOUNTER — Encounter (INDEPENDENT_AMBULATORY_CARE_PROVIDER_SITE_OTHER): Payer: Self-pay

## 2013-10-19 ENCOUNTER — Encounter: Payer: Self-pay | Admitting: Pulmonary Disease

## 2013-10-19 VITALS — BP 138/76 | HR 67 | Ht 68.0 in | Wt 202.0 lb

## 2013-10-19 DIAGNOSIS — R911 Solitary pulmonary nodule: Secondary | ICD-10-CM

## 2013-10-19 DIAGNOSIS — R222 Localized swelling, mass and lump, trunk: Secondary | ICD-10-CM

## 2013-10-19 NOTE — Assessment & Plan Note (Signed)
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 See discussion above.

## 2013-10-19 NOTE — Assessment & Plan Note (Addendum)
The appearance of this 4.2 cm chest wall mass is very worrisome for malignancy. Considering his gross hematuria and bladder mass I think that most likely problem here is either metastatic prostate or bladder cancer.  I explained to him today that the first up is to have a bladder mass biopsy. If there is no malignancy seen on that test then he needs to have a needle biopsy of the right chest wall mass.  I agree with the radiologist that he needs to have a PET/CT considering the high probability of metastatic malignancy of a GU source.  We discussed the plan below at length with he and his wife and their questions were answered.  Plan: -Proceed with bladder mass biopsy -PET/CT -If no evidence of malignancy after the cystoscopy then I will coordinate a needle biopsy of the right chest wall mass -Followup 2-3 weeks after bladder biopsy

## 2013-10-19 NOTE — Patient Instructions (Signed)
We will arrange a PET CT for the rib mass We will see you back in 2-3 weeks or sooner if needed

## 2013-10-19 NOTE — Progress Notes (Signed)
Subjective:    Patient ID: Wesley [name], male    DOB: Jul 25, 1945, 68 y.o.   MRN: [id number]  HPI  This is a very pleasant 68 year old male who comes to our clinic today for evaluation of a chest wall mass. 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 He has never had a prior history of lung disease. Recently he has been evaluated by urology for gross hematuria. He says that the hematuria has been present for approximately one year. During this time his weight has been stable. He denies shortness of breath or hemoptysis or cough. However, he says that he has noted some pain in his right chest wall for the last 2-3 weeks. He says he is also noticed an abnormal growth in the same area. He denies any injury to that area.  He does not have a personal history of malignancy. 10 point review of systems was otherwise negative.  He is scheduled to undergo a cystoscopy with bladder mass biopsy next week. According to the urology chart which was sent to my clinic a PET CT was ordered, but when we reviewed our system we cannot find an active date or time for this.  Past Medical History  Diagnosis Date  . H/O bleeding disorder   . GERD (gastroesophageal reflux disease)   . History of stomach ulcers   . Hx of partial seizures      Family History  Problem Relation Age of Onset  . Heart disease Mother      History   Social History  . Marital Status: Married    Spouse Name: N/A    Number of Children: N/A  . Years of Education: N/A   Occupational History  . Not on file.   Social History Main Topics  . Smoking status: Former Smoker -- 0.10 packs/day for 1 years    Types: Cigarettes    Quit date: 05/04/1969  . Smokeless tobacco: Never Used     Comment: "smoked for about a year in the '70's"  . Alcohol Use: No  . Drug Use: No  . Sexual Activity: Not on file   Other Topics Concern  . Not on file   Social History Narrative  . No narrative on file     No  Known Allergies   Outpatient Prescriptions Prior to Visit  Medication Sig Dispense Refill  . Aspirin-Salicylamide-Caffeine (BC HEADACHE POWDER PO) Take 1 packet by mouth as needed.       No facility-administered medications prior to visit.      Review of Systems  Constitutional: Negative for fever and unexpected weight change.  HENT: Negative for congestion, dental problem, ear pain, nosebleeds, postnasal drip, rhinorrhea, sinus pressure, sneezing, sore throat and trouble swallowing.   Eyes: Negative for redness and itching.  Respiratory: Negative for cough, chest tightness, shortness of breath and wheezing.   Cardiovascular: Negative for palpitations and leg swelling.  Gastrointestinal: Negative for nausea and vomiting.  Genitourinary: Negative for dysuria.  Musculoskeletal: Negative for joint swelling.  Skin: Negative for rash.  Neurological: Negative for headaches.  Hematological: Does not bruise/bleed easily.  Psychiatric/Behavioral: Negative for dysphoric mood. The patient is not nervous/anxious.        Objective:   Physical Exam Filed Vitals:   10/19/13 1118  BP: 138/76  Pulse: 67  Height: 5\' 8"  (1.727 m)  Weight: 202 lb (91.627 kg)  SpO2: 97%  RA  Gen: well appearing, no acute distress HEENT:  NCAT, PERRL, EOMi, OP clear, neck supple without masses PULM: CTA B CHEST: firm 4-5 cm mass felt R chest wall just inferior to nipple CV: RRR, no mgr, no JVD AB: BS+, soft, nontender, no hsm Ext: warm, no edema, no clubbing, no cyanosis Derm: no rash or skin breakdown Neuro: A&Ox4, CN II-XII intact, strength 5/5 in all 4 extremities   09/27/2013 CT abdomen> 4.2 cm rib/chest wall mass right lung 7th rib; 1 cm left lung nodule; bladder mass as well     Assessment & Plan:   Chest wall mass The appearance of this 4.2 cm chest wall mass is very worrisome for malignancy. Considering his gross hematuria and bladder mass I think that most likely problem here is either  metastatic prostate or bladder cancer.  I explained to him today that the first up is to have a bladder mass biopsy. If there is no malignancy seen on that test then he needs to have a needle biopsy of the right chest wall mass.  I agree with the radiologist that he needs to have a PET/CT considering the high probability of metastatic malignancy of a GU source.  We discussed the plan below at length with he and his wife and their questions were answered.  Plan: -Proceed with bladder mass biopsy -PET/CT -If no evidence of malignancy after the cystoscopy then I will coordinate a needle biopsy of the right chest wall mass -Followup 2-3 weeks after bladder biopsy  Lung nodule This 1 cm nodule contralateral to the right chest wall mass again fits with a picture of metastatic malignancy. See discussion above.   Updated Medication List Outpatient Encounter Prescriptions as of 10/19/2013  Medication Sig  . Aspirin-Salicylamide-Caffeine (BC HEADACHE POWDER PO) Take 1 packet by mouth as needed.     Marland Kitchen

## 2013-10-23 ENCOUNTER — Encounter (HOSPITAL_BASED_OUTPATIENT_CLINIC_OR_DEPARTMENT_OTHER): Payer: Self-pay | Admitting: *Deleted

## 2013-10-23 NOTE — Progress Notes (Signed)
SPOKE W/ PT WIFE.  NPO AFTER MN WITH EXCEPTION CLEAR LIQUIDS UNTIL 0700 (NO CREAM/ MILK PRODUCTS).  ARRIVE AT 1145. NEEDS HG. CURRENT EKG IN CHART AND EPIC.

## 2013-10-25 ENCOUNTER — Ambulatory Visit (HOSPITAL_BASED_OUTPATIENT_CLINIC_OR_DEPARTMENT_OTHER): Payer: Medicare HMO | Admitting: Certified Registered"

## 2013-10-25 ENCOUNTER — Encounter (HOSPITAL_BASED_OUTPATIENT_CLINIC_OR_DEPARTMENT_OTHER): Payer: Medicare HMO | Admitting: Certified Registered"

## 2013-10-25 ENCOUNTER — Ambulatory Visit (HOSPITAL_BASED_OUTPATIENT_CLINIC_OR_DEPARTMENT_OTHER)
Admission: RE | Admit: 2013-10-25 | Discharge: 2013-10-25 | Disposition: A | Discharge status: Home / Self Care | Payer: Medicare HMO | Source: Ambulatory Visit | Attending: Urology | Admitting: Urology

## 2013-10-25 ENCOUNTER — Encounter (HOSPITAL_BASED_OUTPATIENT_CLINIC_OR_DEPARTMENT_OTHER)
Admission: RE | Disposition: A | Discharge status: Home / Self Care | Payer: Self-pay | Source: Ambulatory Visit | Attending: Urology

## 2013-10-25 ENCOUNTER — Encounter (HOSPITAL_BASED_OUTPATIENT_CLINIC_OR_DEPARTMENT_OTHER): Payer: Self-pay | Admitting: *Deleted

## 2013-10-25 DIAGNOSIS — D494 Neoplasm of unspecified behavior of bladder: Secondary | ICD-10-CM | POA: Insufficient documentation

## 2013-10-25 DIAGNOSIS — D649 Anemia, unspecified: Secondary | ICD-10-CM | POA: Insufficient documentation

## 2013-10-25 DIAGNOSIS — N400 Enlarged prostate without lower urinary tract symptoms: Secondary | ICD-10-CM | POA: Insufficient documentation

## 2013-10-25 DIAGNOSIS — IMO0002 Reserved for concepts with insufficient information to code with codable children: Secondary | ICD-10-CM | POA: Insufficient documentation

## 2013-10-25 DIAGNOSIS — Z87891 Personal history of nicotine dependence: Secondary | ICD-10-CM | POA: Insufficient documentation

## 2013-10-25 HISTORY — DX: Solitary pulmonary nodule: R91.1

## 2013-10-25 HISTORY — PX: CYSTOSCOPY WITH BIOPSY: SHX5122

## 2013-10-25 HISTORY — PX: TRANSURETHRAL RESECTION OF BLADDER TUMOR WITH GYRUS (TURBT-GYRUS): SHX6458

## 2013-10-25 HISTORY — DX: Personal history of other diseases of the nervous system and sense organs: Z86.69

## 2013-10-25 HISTORY — DX: Neoplasm of unspecified behavior of bladder: D49.4

## 2013-10-25 HISTORY — DX: Hematuria, unspecified: R31.9

## 2013-10-25 HISTORY — DX: Personal history of other diseases of the digestive system: Z87.19

## 2013-10-25 LAB — POCT I-STAT, CHEM 8
BUN: 13 mg/dL (ref 6–23)
CHLORIDE: 101 meq/L (ref 96–112)
CREATININE: 0.9 mg/dL (ref 0.50–1.35)
Calcium, Ion: 1.31 mmol/L — ABNORMAL HIGH (ref 1.13–1.30)
GLUCOSE: 98 mg/dL (ref 70–99)
HEMATOCRIT: 37 % — AB (ref 39.0–52.0)
Hemoglobin: 12.6 g/dL — ABNORMAL LOW (ref 13.0–17.0)
POTASSIUM: 3.8 meq/L (ref 3.7–5.3)
Sodium: 142 mEq/L (ref 137–147)
TCO2: 25 mmol/L (ref 0–100)

## 2013-10-25 SURGERY — TRANSURETHRAL RESECTION OF BLADDER TUMOR WITH GYRUS (TURBT-GYRUS)
Anesthesia: General | Site: Bladder

## 2013-10-25 MED ORDER — DEXAMETHASONE SODIUM PHOSPHATE 4 MG/ML IJ SOLN
INTRAMUSCULAR | Status: DC | PRN
  Administered 2013-10-25: 10 mg via INTRAVENOUS

## 2013-10-25 MED ORDER — SODIUM CHLORIDE 0.9 % IR SOLN
Status: DC | PRN
  Administered 2013-10-25: 12000 mL via INTRAVESICAL

## 2013-10-25 MED ORDER — CEFAZOLIN SODIUM-DEXTROSE 2-3 GM-% IV SOLR
2.0000 g | INTRAVENOUS | Status: AC
  Administered 2013-10-25: 2 g via INTRAVENOUS
  Filled 2013-10-25: qty 50

## 2013-10-25 MED ORDER — PROMETHAZINE HCL 25 MG/ML IJ SOLN
6.2500 mg | INTRAMUSCULAR | Status: DC | PRN
  Filled 2013-10-25: qty 1

## 2013-10-25 MED ORDER — SENNOSIDES-DOCUSATE SODIUM 8.6-50 MG PO TABS: 1.0000 | ORAL_TABLET | Freq: Two times a day (BID) | ORAL | Status: DC

## 2013-10-25 MED ORDER — FENTANYL CITRATE 0.05 MG/ML IJ SOLN
INTRAMUSCULAR | Status: DC | PRN
  Administered 2013-10-25: 50 ug via INTRAVENOUS
  Administered 2013-10-25 (×2): 25 ug via INTRAVENOUS

## 2013-10-25 MED ORDER — PHENAZOPYRIDINE HCL 200 MG PO TABS
200.0000 mg | ORAL_TABLET | Freq: Three times a day (TID) | ORAL | Status: DC | PRN
  Filled 2013-10-25: qty 1

## 2013-10-25 MED ORDER — MIDAZOLAM HCL 2 MG/2ML IJ SOLN
INTRAMUSCULAR | Status: AC
  Filled 2013-10-25: qty 2

## 2013-10-25 MED ORDER — CIPROFLOXACIN HCL 500 MG PO TABS: 500.0000 mg | ORAL_TABLET | Freq: Two times a day (BID) | ORAL | Status: DC

## 2013-10-25 MED ORDER — FENTANYL CITRATE 0.05 MG/ML IJ SOLN
25.0000 ug | INTRAMUSCULAR | Status: DC | PRN
  Filled 2013-10-25: qty 1

## 2013-10-25 MED ORDER — PHENAZOPYRIDINE HCL 200 MG PO TABS: 200.0000 mg | ORAL_TABLET | Freq: Three times a day (TID) | ORAL | Status: DC | PRN

## 2013-10-25 MED ORDER — PROPOFOL 10 MG/ML IV BOLUS
INTRAVENOUS | Status: DC | PRN
  Administered 2013-10-25: 180 mg via INTRAVENOUS

## 2013-10-25 MED ORDER — OXYCODONE-ACETAMINOPHEN 5-325 MG PO TABS: 1.0000 | ORAL_TABLET | ORAL | Status: DC | PRN

## 2013-10-25 MED ORDER — LIDOCAINE HCL (CARDIAC) 20 MG/ML IV SOLN
INTRAVENOUS | Status: DC | PRN
  Administered 2013-10-25: 60 mg via INTRAVENOUS

## 2013-10-25 MED ORDER — LACTATED RINGERS IV SOLN
INTRAVENOUS | Status: DC
  Administered 2013-10-25 (×2): via INTRAVENOUS
  Filled 2013-10-25: qty 1000

## 2013-10-25 MED ORDER — OXYBUTYNIN CHLORIDE 5 MG PO TABS: 5.0000 mg | ORAL_TABLET | Freq: Four times a day (QID) | ORAL | Status: DC | PRN

## 2013-10-25 MED ORDER — MIDAZOLAM HCL 5 MG/5ML IJ SOLN
INTRAMUSCULAR | Status: DC | PRN
  Administered 2013-10-25: 2 mg via INTRAVENOUS

## 2013-10-25 MED ORDER — BELLADONNA ALKALOIDS-OPIUM 16.2-60 MG RE SUPP
RECTAL | Status: AC
  Filled 2013-10-25: qty 1

## 2013-10-25 MED ORDER — ONDANSETRON HCL 4 MG/2ML IJ SOLN
INTRAMUSCULAR | Status: DC | PRN
  Administered 2013-10-25: 4 mg via INTRAVENOUS

## 2013-10-25 MED ORDER — BELLADONNA ALKALOIDS-OPIUM 16.2-60 MG RE SUPP
RECTAL | Status: DC | PRN
  Administered 2013-10-25: 1 via RECTAL

## 2013-10-25 MED ORDER — FENTANYL CITRATE 0.05 MG/ML IJ SOLN
INTRAMUSCULAR | Status: AC
  Filled 2013-10-25: qty 6

## 2013-10-25 MED ORDER — PHENAZOPYRIDINE HCL 100 MG PO TABS
ORAL_TABLET | ORAL | Status: AC
  Filled 2013-10-25: qty 2

## 2013-10-25 SURGICAL SUPPLY — 37 items
BAG DRAIN URO-CYSTO SKYTR STRL (DRAIN) ×3 IMPLANT
BAG DRN ANRFLXCHMBR STRAP LEK (BAG)
BAG DRN UROCATH (DRAIN) ×1
BAG URINE DRAINAGE (UROLOGICAL SUPPLIES) IMPLANT
BAG URINE LEG 19OZ MD ST LTX (BAG) IMPLANT
CANISTER SUCT LVC 12 LTR MEDI- (MISCELLANEOUS) ×5 IMPLANT
CATH FOLEY 3WAY 30CC 24FR (CATHETERS)
CATH HEMA 3WAY 30CC 22FR COUDE (CATHETERS) ×2 IMPLANT
CATH HEMA 3WAY 30CC 24FR COUDE (CATHETERS) IMPLANT
CATH HEMA 3WAY 30CC 24FR RND (CATHETERS) IMPLANT
CATH URTH STD 24FR FL 3W 2 (CATHETERS) ×1 IMPLANT
CLOTH BEACON ORANGE TIMEOUT ST (SAFETY) ×3 IMPLANT
DRAPE CAMERA CLOSED 9X96 (DRAPES) ×3 IMPLANT
ELECT BUTTON HF 24-28F 2 30DE (ELECTRODE) IMPLANT
ELECT LOOP MED HF 24F 12D CBL (CLIP) ×5 IMPLANT
ELECT REM PT RETURN 9FT ADLT (ELECTROSURGICAL)
ELECT RESECT VAPORIZE 12D CBL (ELECTRODE) ×1 IMPLANT
ELECTRODE REM PT RTRN 9FT ADLT (ELECTROSURGICAL) ×1 IMPLANT
EVACUATOR MICROVAS BLADDER (UROLOGICAL SUPPLIES) IMPLANT
GLOVE BIO SURGEON STRL SZ7 (GLOVE) ×3 IMPLANT
GLOVE BIOGEL M 6.5 STRL (GLOVE) ×2 IMPLANT
GLOVE BIOGEL PI IND STRL 6.5 (GLOVE) IMPLANT
GLOVE BIOGEL PI INDICATOR 6.5 (GLOVE) ×4
GLOVE INDICATOR 7.5 STRL GRN (GLOVE) IMPLANT
GOWN STRL REIN XL XLG (GOWN DISPOSABLE) ×1 IMPLANT
GOWN STRL REUS W/TWL LRG LVL3 (GOWN DISPOSABLE) ×2 IMPLANT
GOWN STRL REUS W/TWL XL LVL3 (GOWN DISPOSABLE) ×2 IMPLANT
HOLDER FOLEY CATH W/STRAP (MISCELLANEOUS) IMPLANT
IV NS IRRIG 3000ML ARTHROMATIC (IV SOLUTION) ×8 IMPLANT
NEEDLE HYPO 22GX1.5 SAFETY (NEEDLE) IMPLANT
NS IRRIG 500ML POUR BTL (IV SOLUTION) ×6 IMPLANT
PACK CYSTOSCOPY (CUSTOM PROCEDURE TRAY) ×3 IMPLANT
PLUG CATH AND CAP STER (CATHETERS) ×2 IMPLANT
SET ASPIRATION TUBING (TUBING) ×2 IMPLANT
SYR 30ML LL (SYRINGE) ×2 IMPLANT
SYRINGE IRR TOOMEY STRL 70CC (SYRINGE) ×3 IMPLANT
WATER STERILE IRR 3000ML UROMA (IV SOLUTION) ×1 IMPLANT

## 2013-10-25 NOTE — Anesthesia Preprocedure Evaluation (Addendum)
Anesthesia Evaluation  Patient identified by MRN, date of birth, ID band Patient awake    Reviewed: Allergy & Precautions, H&P , NPO status , Patient's Chart, lab work & pertinent test results  Airway Mallampati: II TM Distance: >3 FB Neck ROM: Full    Dental no notable dental hx.    Pulmonary neg pulmonary ROS, former smoker,  breath sounds clear to auscultation  Pulmonary exam normal       Cardiovascular negative cardio ROS  Rhythm:Regular Rate:Normal  H/O orthostatic hypotension  ECG: SB occasional PACs   Neuro/Psych Seizures -, Well Controlled,  H/O seizures as a child negative psych ROS   GI/Hepatic negative GI ROS, Neg liver ROS,   Endo/Other  negative endocrine ROS  Renal/GU negative Renal ROS  negative genitourinary   Musculoskeletal negative musculoskeletal ROS (+)   Abdominal   Peds negative pediatric ROS (+)  Hematology  (+) anemia ,   Anesthesia Other Findings   Reproductive/Obstetrics negative OB ROS                          Anesthesia Physical Anesthesia Plan  ASA: II  Anesthesia Plan: General   Post-op Pain Management:    Induction: Intravenous  Airway Management Planned: LMA  Additional Equipment:   Intra-op Plan:   Post-operative Plan: Extubation in OR  Informed Consent: I have reviewed the patients History and Physical, chart, labs and discussed the procedure including the risks, benefits and alternatives for the proposed anesthesia with the patient or authorized representative who has indicated his/her understanding and acceptance.   Dental advisory given  Plan Discussed with: CRNA  Anesthesia Plan Comments:         Anesthesia Quick Evaluation

## 2013-10-25 NOTE — Transfer of Care (Signed)
Immediate Anesthesia Transfer of Care Note  Patient: Wesley [name]  Procedure(s) Performed: Procedure(s) (LRB): TRANSURETHRAL RESECTION OF BLADDER TUMOR WITH GYRUS (TURBT-GYRUS) (N/A) CYSTOSCOPY (N/A)  Patient Location: PACU  Anesthesia Type: General  Level of Consciousness: awake, oriented, sedated and patient cooperative  Airway & Oxygen Therapy: Patient Spontanous Breathing and Patient connected to face mask oxygen  Post-op Assessment: Report given to PACU RN and Post -op Vital signs reviewed and stable  Post vital signs: Reviewed and stable  Complications: No apparent anesthesia complications

## 2013-10-25 NOTE — Anesthesia Procedure Notes (Signed)
Procedure Name: LMA Insertion Date/Time: 10/25/2013 2:10 PM Performed by: Denna Haggard D Pre-anesthesia Checklist: Patient identified, Emergency Drugs available, Suction available and Patient being monitored Patient Re-evaluated:Patient Re-evaluated prior to inductionOxygen Delivery Method: Circle System Utilized Preoxygenation: Pre-oxygenation with 100% oxygen Intubation Type: IV induction Ventilation: Mask ventilation without difficulty LMA: LMA inserted LMA Size: 4.0 Number of attempts: 1 Airway Equipment and Method: bite block Placement Confirmation: positive ETCO2 Tube secured with: Tape Dental Injury: Teeth and Oropharynx as per pre-operative assessment

## 2013-10-25 NOTE — Op Note (Signed)
Urology Operative Report  Date of Procedure: 10/25/13  Surgeon: Rolan Bucco, MD Assistant:  None  Preoperative Diagnosis: Bladder tumor. Postoperative Diagnosis: Bladder tumor. Benign prostatic hyperplasia with intravesical median lobe.  Procedure(s): Bladder biopsy (22 modifier) Cystoscopy. Foley catheter placement.  Estimated blood loss: 50 cc  Specimen: Bladder tumor.  Drains: 22Fr 3- way coude tip hematuria cath (30cc in balloon).  Complications: None  Findings: Large intravesical median lobe of prostate. 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  History of present illness: 68 year old male presented today for bladder tumor found on office cystoscopy for gross hematuria. This was a small tumor at the bladder neck. During workup he was found to have lesion in his lungs and a lesion in his rib.   Procedure in detail: After informed consent was obtained, the patient was taken to the operating room. They were placed in the supine position. SCDs were turned on and in place. IV antibiotics were infused, and general anesthesia was induced. A timeout was performed in which the correct patient, surgical site, and procedure were identified and agreed upon by the team.  The patient was placed in a dorsolithotomy position, making sure to pad all pertinent neurovascular pressure points. A belladonna and opium suppository was placed into the rectum. The genitals were prepped and draped in the usual sterile fashion.  A rigid cystoscope was advanced through the urethra and into the bladder. It was noted that he had a large obstructing prostate with a large intravesical median lobe. Almost immediately after placing the cystoscope there was bleeding from the median lobe of the prostate which increased the difficulty of this case. What normally would've taken less than 30 minutes to perform took over an hour to perform biopsy of the bladder.  I evaluated the bladder in a systematic fashion fully  distended bladder and evaluating with a 30 and 70 lens. There was noted to be multiple trabeculations. The ureter orifices were hidden behind the median lobe of the prostate but were identified and seen to effluxed clear yellow urine. There was noted to be small bladder tumor at the 9:00 position of the bladder neck. The gyrus resectoscope was used to resect this area and the fragments were provided to pathology. This tumor was approximately 5 mm in size. Extremely small. He continued to have significant bleeding from the prostate and I considered resecting the median lobe to mitigate this bleeding, but I felt it would be more appropriate to place a large bore catheter and see if I could tent amount of bleeding.  I placed 10 cc of lidocaine jelly into the urethra and then placed a 20 French coud-tip hematuria 3-way catheter. I placed 30 cc of sterile water in the balloon. I placed this on traction and irrigated with over 1 L of sterile normal saline. This began to clear light pain and it appeared that further resection of the prostate would not be necessary at this time.  He was placed back in a supine position, anesthesia was reversed, and he was taken to the PACU in stable condition.  To be discharged home with Foley catheter in place. He'll be given ciprofloxacin to take before removal of the Foley catheter next week.  All counts were correct at the end of the case.

## 2013-10-25 NOTE — Discharge Instructions (Signed)
DISCHARGE INSTRUCTIONS FOR TRANSURETHRAL SURGERY OF BLADDER  MEDICATIONS:   1. DO NOT RESUME YOUR ANY BLOOD THINNERS FOR ONE WEEK.  2. Resume all your other meds from home.    ACTIVITY 1. No heavy lifting >10 pounds for 2 weeks 2. No sexual activity for 2 weeks 3. No strenuous activity for 2 weeks 4. No driving while on narcotic pain medications 5. Drink plenty of water 6. Continue to walk at home - you can still get blood clots when you are at  home, so keep active, but don't over do it. 7. Your urine may have some blood in it - make sure you drink plenty of water,  call or come to the ER immediately if your catheter stops draining  FOLEY CATHETER  (If you go home with a catheter in place.) 1. Make sure your catheter is attached to your leg at all times - do not let  anything pull on it 2. If the urine is your tube starts looking dark red or if it stops draining,  call us immediately or come to the ER 3. Drink plenty of water, if you do notice your urine looking darking, sit down,  relax and drink lots of water 4. You will be given a leg bag as well as an overnight bag for your catheter -  MAKE SURE ATTACHED TO YOUR LEG AT ALL TIMES WITH TAPE OR LEG STRAP  BATHING 1. You can shower.  You may take a bath unless you have a Foley catheter in place.  SIGNS/SYMPTOMS TO CALL: 1. Please call us if you have a fever greater than 101.5, uncontrolled  nausea/vomiting, uncontrolled pain, dizziness, unable to urinate, chest pain, shortness of breath, leg swelling, leg pain, or any other concerns  or questions.  You can reach [place] at [phone].   Post Anesthesia Home Care Instructions  Activity: Get plenty of rest for the remainder of the day. A responsible adult should stay with you for 24 hours following the procedure.  For the next 24 hours, DO NOT: -Drive a car -Paediatric nurse -Drink alcoholic beverages -Take any medication unless instructed by your physician -Make any  legal decisions or sign important papers.  Meals: Start with liquid foods such as gelatin or soup. Progress to regular foods as tolerated. Avoid greasy, spicy, heavy foods. If nausea and/or vomiting occur, drink only clear liquids until the nausea and/or vomiting subsides. Call your physician if vomiting continues.  Special Instructions/Symptoms: Your throat may feel dry or sore from the anesthesia or the breathing tube placed in your throat during surgery. If this causes discomfort, gargle with warm salt water. The discomfort should disappear within 24 hours.

## 2013-10-25 NOTE — H&P (Signed)
Urology History and Physical Exam  CC: Bladder tumor.  HPI:  69 year old male presents today for bladder tumor.  This was discovered during workup for gross hematuria.  CT hematuria protocol 09/27/13 revealed a lung nodule and a rib mass.  It was negative for hydronephrosis, kidney stones, renal masses, or filling defects of the urinary tract. The bladder tumor is less than 1 cm in size.  It is located at the 9 o'clock position on the bladder neck.  We discussed management options along with risks, benefits, side effects, and likelihood of achieving goals. He presents today for cystoscopy, bladder biopsy, possible transurethral resection of bladder tumor.  He understands that he may need a catheter afterwards. UA 09/29/13 was negative for signs of infection.    PMH: Past Medical History  Diagnosis Date  . Pulmonary nodule, right     W/  CHEST WALL MASS--  SCHEDULED FOR BIOPSY W/ DR [name] ON FRIDAY 10-27-2013  . History of seizures as a child     AGE 30 OR 8--- NONE ISSUE SINCE  . History of GI bleed     SECONDARY TO ULCER --  DEC 2011  RESOLVED PER LAST EGD 07-05-2010  . Bladder tumor   . Hematuria     PSH: Past Surgical History  Procedure Laterality Date  . Excision benign left axilla tumor  1993  . Esophagogastroduodenoscopy  X5   LAST ONE 07-05-2010    Allergies: No Known Allergies  Medications: No prescriptions prior to admission     Social History: History   Social History  . Marital Status: Married    Spouse Name: N/A    Number of Children: N/A  . Years of Education: N/A   Occupational History  . Not on file.   Social History Main Topics  . Smoking status: Former Smoker -- 0.10 packs/day for 1 years    Types: Cigarettes    Quit date: 05/04/1969  . Smokeless tobacco: Never Used  . Alcohol Use: No  . Drug Use: No  . Sexual Activity: Not on file   Other Topics Concern  . Not on file   Social History Narrative  . No narrative on file    Family  History: Family History  Problem Relation Age of Onset  . Heart disease Mother     Review of Systems: Positive: None. Negative: Fever, SOB, or chest pain.  A further 10 point review of systems was negative except what is listed in the HPI.  Physical Exam: There were no vitals filed for this visit.  General: No acute distress.  Awake. Head:  Normocephalic.  Atraumatic. ENT:  EOMI.  Mucous membranes moist Neck:  Supple.  No lymphadenopathy. CV:  S1 present. S2 present. Regular rate. Pulmonary: Equal effort bilaterally.  Clear to auscultation bilaterally. Abdomen: Soft.  Non- tender to palpation. Skin:  Normal turgor.  No visible rash. Extremity: No gross deformity of bilateral upper extremities.  No gross deformity of    bilateral lower extremities. Neurologic: Alert. Appropriate mood.    Studies:  No results found for this basename: HGB, WBC, PLT,  in the last 72 hours  No results found for this basename: NA, K, CL, CO2, BUN, CREATININE, CALCIUM, MAGNESIUM, GFRNONAA, GFRAA,  in the last 72 hours   No results found for this basename: PT, INR, APTT,  in the last 72 hours   No components found with this basename: ABG,     Assessment:  Bladder tumor.  Plan: To OR for cystoscopy, bladder  biopsy, possible transurethral resection of bladder tumor.

## 2013-10-26 ENCOUNTER — Emergency Department (HOSPITAL_COMMUNITY)
Admission: EM | Admit: 2013-10-26 | Discharge: 2013-10-27 | Disposition: A | Discharge status: Home / Self Care | Payer: Medicare HMO | Attending: Emergency Medicine | Admitting: Emergency Medicine

## 2013-10-26 ENCOUNTER — Encounter (HOSPITAL_BASED_OUTPATIENT_CLINIC_OR_DEPARTMENT_OTHER): Payer: Self-pay | Admitting: Urology

## 2013-10-26 DIAGNOSIS — R109 Unspecified abdominal pain: Secondary | ICD-10-CM | POA: Insufficient documentation

## 2013-10-26 DIAGNOSIS — Z79899 Other long term (current) drug therapy: Secondary | ICD-10-CM | POA: Insufficient documentation

## 2013-10-26 DIAGNOSIS — R339 Retention of urine, unspecified: Secondary | ICD-10-CM

## 2013-10-26 DIAGNOSIS — Z8669 Personal history of other diseases of the nervous system and sense organs: Secondary | ICD-10-CM | POA: Insufficient documentation

## 2013-10-26 DIAGNOSIS — Z87448 Personal history of other diseases of urinary system: Secondary | ICD-10-CM | POA: Insufficient documentation

## 2013-10-26 DIAGNOSIS — N390 Urinary tract infection, site not specified: Secondary | ICD-10-CM | POA: Insufficient documentation

## 2013-10-26 DIAGNOSIS — Z792 Long term (current) use of antibiotics: Secondary | ICD-10-CM | POA: Insufficient documentation

## 2013-10-26 DIAGNOSIS — Z8719 Personal history of other diseases of the digestive system: Secondary | ICD-10-CM | POA: Insufficient documentation

## 2013-10-26 DIAGNOSIS — Z8551 Personal history of malignant neoplasm of bladder: Secondary | ICD-10-CM | POA: Insufficient documentation

## 2013-10-26 DIAGNOSIS — Z87891 Personal history of nicotine dependence: Secondary | ICD-10-CM | POA: Insufficient documentation

## 2013-10-26 NOTE — Anesthesia Postprocedure Evaluation (Signed)
  Anesthesia Post-op Note  Patient: Wesley [name]  Procedure(s) Performed: Procedure(s) (LRB): TRANSURETHRAL RESECTION OF BLADDER TUMOR WITH GYRUS (TURBT-GYRUS) (N/A) CYSTOSCOPY (N/A)  Patient Location: PACU  Anesthesia Type: General  Level of Consciousness: awake and alert   Airway and Oxygen Therapy: Patient Spontanous Breathing  Post-op Pain: mild  Post-op Assessment: Post-op Vital signs reviewed, Patient's Cardiovascular Status Stable, Respiratory Function Stable, Patent Airway and No signs of Nausea or vomiting  Last Vitals:  Filed Vitals:   10/25/13 1745  BP: 155/85  Pulse:   Temp: 36 C  Resp: 14    Post-op Vital Signs: stable   Complications: No apparent anesthesia complications

## 2013-10-27 ENCOUNTER — Encounter (HOSPITAL_COMMUNITY): Payer: Self-pay | Admitting: Emergency Medicine

## 2013-10-27 ENCOUNTER — Ambulatory Visit (HOSPITAL_COMMUNITY)
Admission: RE | Admit: 2013-10-27 | Discharge: 2013-10-27 | Disposition: A | Discharge status: Home / Self Care | Payer: Medicare HMO | Source: Ambulatory Visit | Attending: Pulmonary Disease | Admitting: Pulmonary Disease

## 2013-10-27 DIAGNOSIS — N329 Bladder disorder, unspecified: Secondary | ICD-10-CM | POA: Insufficient documentation

## 2013-10-27 DIAGNOSIS — M949 Disorder of cartilage, unspecified: Principal | ICD-10-CM

## 2013-10-27 DIAGNOSIS — R911 Solitary pulmonary nodule: Secondary | ICD-10-CM | POA: Insufficient documentation

## 2013-10-27 DIAGNOSIS — M899 Disorder of bone, unspecified: Secondary | ICD-10-CM | POA: Insufficient documentation

## 2013-10-27 LAB — URINALYSIS, ROUTINE W REFLEX MICROSCOPIC
Bilirubin Urine: NEGATIVE
Glucose, UA: NEGATIVE mg/dL
Ketones, ur: NEGATIVE mg/dL
NITRITE: NEGATIVE
PROTEIN: 100 mg/dL — AB
SPECIFIC GRAVITY, URINE: 1.021 (ref 1.005–1.030)
UROBILINOGEN UA: 1 mg/dL (ref 0.0–1.0)
pH: 5.5 (ref 5.0–8.0)

## 2013-10-27 LAB — URINE MICROSCOPIC-ADD ON

## 2013-10-27 LAB — GLUCOSE, CAPILLARY: Glucose-Capillary: 107 mg/dL — ABNORMAL HIGH (ref 70–99)

## 2013-10-27 MED ORDER — FLUDEOXYGLUCOSE F - 18 (FDG) INJECTION
10.1000 | Freq: Once | INTRAVENOUS | Status: AC | PRN
  Administered 2013-10-27: 10.1 via INTRAVENOUS

## 2013-10-27 MED ORDER — CIPROFLOXACIN HCL 500 MG PO TABS: 500.0000 mg | ORAL_TABLET | Freq: Two times a day (BID) | ORAL | Status: DC

## 2013-10-27 NOTE — ED Notes (Signed)
PA at bedside.

## 2013-10-27 NOTE — Discharge Instructions (Signed)
Please followup with your urologist on Monday as discussed. Return for any changing or worsening symptoms. Take antibiotics as prescribed for the full length of time.    Urinary Tract Infection Urinary tract infections (UTIs) can develop anywhere along your urinary tract. Your urinary tract is your body's drainage system for removing wastes and extra water. Your urinary tract includes two kidneys, two ureters, a bladder, and a urethra. Your kidneys are a pair of bean-shaped organs. Each kidney is about the size of your fist. They are located below your ribs, one on each side of your spine. CAUSES Infections are caused by microbes, which are microscopic organisms, including fungi, viruses, and bacteria. These organisms are so small that they can only be seen through a microscope. Bacteria are the microbes that most commonly cause UTIs. SYMPTOMS  Symptoms of UTIs may vary by age and gender of the patient and by the location of the infection. Symptoms in young women typically include a frequent and intense urge to urinate and a painful, burning feeling in the bladder or urethra during urination. Older women and men are more likely to be tired, shaky, and weak and have muscle aches and abdominal pain. A fever may mean the infection is in your kidneys. Other symptoms of a kidney infection include pain in your back or sides below the ribs, nausea, and vomiting. DIAGNOSIS To diagnose a UTI, your caregiver will ask you about your symptoms. Your caregiver also will ask to provide a urine sample. The urine sample will be tested for bacteria and white blood cells. White blood cells are made by your body to help fight infection. TREATMENT  Typically, UTIs can be treated with medication. Because most UTIs are caused by a bacterial infection, they usually can be treated with the use of antibiotics. The choice of antibiotic and length of treatment depend on your symptoms and the type of bacteria causing your  infection. HOME CARE INSTRUCTIONS  If you were prescribed antibiotics, take them exactly as your caregiver instructs you. Finish the medication even if you feel better after you have only taken some of the medication.  Drink enough water and fluids to keep your urine clear or pale yellow.  Avoid caffeine, tea, and carbonated beverages. They tend to irritate your bladder.  Empty your bladder often. Avoid holding urine for long periods of time.  Empty your bladder before and after sexual intercourse.  After a bowel movement, women should cleanse from front to back. Use each tissue only once. SEEK MEDICAL CARE IF:   You have back pain.  You develop a fever.  Your symptoms do not begin to resolve within 3 days. SEEK IMMEDIATE MEDICAL CARE IF:   You have severe back pain or lower abdominal pain.  You develop chills.  You have nausea or vomiting.  You have continued burning or discomfort with urination. MAKE SURE YOU:   Understand these instructions.  Will watch your condition.  Will get help right away if you are not doing well or get worse. Document Released: 01/28/2005 Document Revised: 10/20/2011 Document Reviewed: 05/29/2011 [facility] Patient Information 2015 [place], [place]. This information is not intended to replace advice given to you by your health care provider. Make sure you discuss any questions you have with your health care provider.

## 2013-10-27 NOTE — ED Notes (Signed)
Pt ambulatory to restroom to have a BM. Will do In and Out when pt has returned.

## 2013-10-27 NOTE — ED Notes (Signed)
Bladder scanner high score was 235mL

## 2013-10-27 NOTE — ED Notes (Signed)
Patient states he had a catheter removed today around 1600. Patient states he has only been able to void once since then. Patient states he feels like he needs to void but it unable to do so.

## 2013-10-27 NOTE — ED Notes (Signed)
Patient asking to speak with PA again Will make PA aware

## 2013-10-27 NOTE — ED Provider Notes (Signed)
CSN: [id number]     Arrival date & time 10/26/13  2233 History   First MD Initiated Contact with Patient 10/27/13 [phone]     Chief Complaint  Patient presents with  . Urinary Retention   HPI  History provided by the patient and wife. Patient is a 68 year old African American male with history of recent tumor resection from the bladder presenting with symptoms of bladder pressure and urinary retention. Patient states he was having some issues with his Foley catheter following his procedure and yesterday had his catheter removed around 4 PM. He was able to urinate once regularly but since that time has not been able to urinate. He feels pressure and urge to go but only have small drops of pinkish and blood tinged urine. He denies having any flank pain. Denies any nausea or vomiting. No fever, chills sweats. No other aggravating or alleviating factors. No other associated symptoms.   Past Medical History  Diagnosis Date  . Pulmonary nodule, right     W/  CHEST WALL MASS--  SCHEDULED FOR BIOPSY W/ DR [name] ON FRIDAY 10-27-2013  . History of seizures as a child     AGE 77 OR 8--- NONE ISSUE SINCE  . History of GI bleed     SECONDARY TO ULCER --  DEC 2011  RESOLVED PER LAST EGD 07-05-2010  . Bladder tumor   . Hematuria    Past Surgical History  Procedure Laterality Date  . Excision benign left axilla tumor  1993  . Esophagogastroduodenoscopy  X5   LAST ONE 07-05-2010  . Transurethral resection of bladder tumor with gyrus (turbt-gyrus) N/A 10/25/2013    Procedure: TRANSURETHRAL RESECTION OF BLADDER TUMOR WITH GYRUS (TURBT-GYRUS);  Surgeon: Sharyn Creamer, MD;  Location: [facility];  Service: Urology;  Laterality: N/A;  . Cystoscopy with biopsy N/A 10/25/2013    Procedure: CYSTOSCOPY;  Surgeon: Sharyn Creamer, MD;  Location: [facility];  Service: Urology;  Laterality: N/A;   Family History  Problem Relation Age of Onset  . Heart disease Mother    History   Substance Use Topics  . Smoking status: Former Smoker -- 0.10 packs/day for 1 years    Types: Cigarettes    Quit date: 05/04/1969  . Smokeless tobacco: Never Used  . Alcohol Use: No    Review of Systems  Constitutional: Negative for fever, chills and diaphoresis.  Gastrointestinal: Positive for abdominal pain. Negative for nausea, vomiting, diarrhea and constipation.  Genitourinary: Positive for hematuria and difficulty urinating. Negative for frequency and flank pain.  All other systems reviewed and are negative.     Allergies  Review of patient's allergies indicates no known allergies.  Home Medications   Prior to Admission medications   Medication Sig Start Date End Date Taking? Authorizing Provider  oxybutynin (DITROPAN) 5 MG tablet Take 1 tablet (5 mg total) by mouth every 6 (six) hours as needed for bladder spasms. 10/25/13  Yes Sharyn Creamer, MD  oxyCODONE-acetaminophen (PERCOCET/ROXICET) 5-325 MG per tablet Take 1-2 tablets by mouth every 4 (four) hours as needed for severe pain.   Yes Historical Provider, MD  ciprofloxacin (CIPRO) 500 MG tablet Take 1 tablet (500 mg total) by mouth 2 (two) times daily. Begin the day prior to follow up visit. 10/25/13   Sharyn Creamer, MD  phenazopyridine (PYRIDIUM) 200 MG tablet Take 1 tablet (200 mg total) by mouth 3 (three) times daily as needed for pain. 10/25/13   Sharyn Creamer, MD  senna-docusate (SENOKOT S) 8.6-50 MG per tablet Take 1 tablet by mouth 2 (two) times daily. 10/25/13   Sharyn Creamer, MD   BP 149/79  Pulse 62  Temp(Src) 98.4 F (36.9 C) (Oral)  Resp 16  SpO2 99% Physical Exam  Nursing note and vitals reviewed. Constitutional: He is oriented to person, place, and time. He appears well-developed and well-nourished. No distress.  HENT:  Head: Normocephalic.  Cardiovascular: Normal rate and regular rhythm.   Pulmonary/Chest: Effort normal and breath sounds normal. No respiratory distress. He has no  wheezes.  Abdominal: Soft. There is tenderness in the suprapubic area. There is no rebound, no guarding, no CVA tenderness, no tenderness at McBurney's point and negative Murphy's sign.  Genitourinary: Penis normal.  Musculoskeletal: Normal range of motion.  Neurological: He is alert and oriented to person, place, and time.  Skin: Skin is warm.  Psychiatric: He has a normal mood and affect. His behavior is normal.    ED Course  Procedures   COORDINATION OF CARE:  Nursing notes reviewed. Vital signs reviewed. Initial pt interview and examination performed.   Filed Vitals:   10/26/13 2323  BP: 149/79  Pulse: 62  Temp: 98.4 F (36.9 C)  TempSrc: Oral  Resp: 16  SpO2: 99%    2:19 AM-patient seen and evaluated. Patient is pacing the room appears in some slight discomfort. Does not appear in severe pain or distress.  I discussed options of treatment with patient. He is feeling significant bladder pressures but is unable to urinate. We discussed options of an out catheter versus indwelling Foley catheter in at this time patient was stabbing and out catheter to reattempt urinating on his own.   Patient did have over 300 cc removed after in and out catheter. He has been drinking water but has not been able to urinate regularly since. He is beginning to feel worse and pressure in the bladder area and is now wishing to have a Foley cath replaced. He does have a followup appointment on Monday with his urologist.  Patient also with signs for for possible UTI. Will give patient prescription for Cipro.     MDM   Final diagnoses:  Urinary retention  UTI (lower urinary tract infection)        Martie Lee, PA-C 10/27/13 [phone]

## 2013-10-27 NOTE — ED Notes (Addendum)
Foley cath placed per orders Leg bag placed per orders Patient states that he is well aware of how to use foley cath and leg bag system Patient in NAD upon time of DC Patient aware of need to make and keep f/u appointment with Urologist

## 2013-10-28 LAB — URINE CULTURE
Colony Count: NO GROWTH
Culture: NO GROWTH

## 2013-10-29 NOTE — ED Provider Notes (Signed)
Medical screening examination/treatment/procedure(s) were performed by non-physician practitioner and as supervising physician I was immediately available for consultation/collaboration.   Houston Siren III, MD 10/29/13 1754

## 2013-10-31 ENCOUNTER — Telehealth: Payer: Self-pay | Admitting: Pulmonary Disease

## 2013-10-31 NOTE — Progress Notes (Signed)
Quick Note:  Dr. [name] has spoken to Mr. Wesley [name] to make pt aware of results and recs. Nothing further needed at this time. ______

## 2013-10-31 NOTE — Telephone Encounter (Signed)
I called Wesley [name] to clarify that he understands that he needs a needle biopsy of the chest wall lesion.  I spoke with the patient and his wife and they plan to see me tomorrow to discuss further.

## 2013-11-01 ENCOUNTER — Ambulatory Visit (INDEPENDENT_AMBULATORY_CARE_PROVIDER_SITE_OTHER): Payer: Medicare HMO | Admitting: Pulmonary Disease

## 2013-11-01 ENCOUNTER — Encounter: Payer: Self-pay | Admitting: Pulmonary Disease

## 2013-11-01 VITALS — BP 136/74 | HR 81 | Ht 68.0 in | Wt 194.0 lb

## 2013-11-01 DIAGNOSIS — R222 Localized swelling, mass and lump, trunk: Secondary | ICD-10-CM

## 2013-11-01 DIAGNOSIS — R911 Solitary pulmonary nodule: Secondary | ICD-10-CM

## 2013-11-01 NOTE — Assessment & Plan Note (Signed)
The 1 cm left-sided pulmonary nodule did not have much FDG activity on the PET scan. It still needs to be monitored, but we will not biopsy it for now as the more concerning lesion is the right chest wall lesion.

## 2013-11-01 NOTE — Patient Instructions (Signed)
We will arrange a biopsy of your right chest wall for next week We will see you back in about 3-4 weeks or sooner if needed

## 2013-11-01 NOTE — Assessment & Plan Note (Signed)
The PET scan from last week was positive, and that this right-sided chest wall mass is very worrisome for malignancy. Today we discussed the risks and benefits of a CT guided needle biopsy at length. All questions were answered. He is willing to proceed.  Plan: - CT guided needle biopsy ordered of the right sided chest wall mass

## 2013-11-01 NOTE — Progress Notes (Signed)
   Subjective:    Patient ID: Wesley [name], male    DOB: 1945/11/15, 68 y.o.   MRN: [id number] Synopsis:: Wesley [name] was referred to the [facility] pulmonary clinic in June of 2015 when a right-sided chest wall mass in the left pulmonary nodule was discovered on a CT abdomen after he developed gross hematuria. He had a bladder mass biopsy in June of 2015 which was found to be benign. HPI  11/01/2013> Wesley [name] returns to clinic today to discuss the results of his PET scan. He continues to have mild pain of the right sided chest wall mass. He denies shortness of breath or cough. No hemoptysis. He had his bladder biopsy last week and is slowly recovering from that.  Past Medical History  Diagnosis Date  . Pulmonary nodule, right     W/  CHEST WALL MASS--  SCHEDULED FOR BIOPSY W/ DR [name] ON FRIDAY 10-27-2013  . History of seizures as a child     AGE 39 OR 8--- NONE ISSUE SINCE  . History of GI bleed     SECONDARY TO ULCER --  DEC 2011  RESOLVED PER LAST EGD 07-05-2010  . Bladder tumor   . Hematuria      Review of Systems     Objective:   Physical Exam Filed Vitals:   11/01/13 1631  BP: 136/74  Pulse: 81  Height: 5\' 8"  (1.727 m)  Weight: 194 lb (87.998 kg)  SpO2: 99%   RA  Gen: well appearing, no acute distress HEENT: NCAT, EOMi, OP clear, PULM: CTA B Chest: palpable R sided chest wall mass CV: RRR, no mgr, no JVD AB: BS+, soft, nontender, no hsm Ext: warm, no edema, no clubbing, no cyanosis     Assessment & Plan:   Chest wall mass The PET scan from last week was positive, and that this right-sided chest wall mass is very worrisome for malignancy. Today we discussed the risks and benefits of a CT guided needle biopsy at length. All questions were answered. He is willing to proceed.  Plan: - CT guided needle biopsy ordered of the right sided chest wall mass  Lung nodule The 1 cm left-sided pulmonary nodule did not have much FDG activity on the PET scan. It still needs to  be monitored, but we will not biopsy it for now as the more concerning lesion is the right chest wall lesion.    Updated Medication List Outpatient Encounter Prescriptions as of 11/01/2013  Medication Sig  . oxyCODONE-acetaminophen (PERCOCET/ROXICET) 5-325 MG per tablet Take 1-2 tablets by mouth every 4 (four) hours as needed for severe pain.  Marland Kitchen senna-docusate (SENOKOT S) 8.6-50 MG per tablet Take 1 tablet by mouth 2 (two) times daily.  . [DISCONTINUED] ciprofloxacin (CIPRO) 500 MG tablet Take 1 tablet (500 mg total) by mouth 2 (two) times daily. Begin the day prior to follow up visit.  . [DISCONTINUED] ciprofloxacin (CIPRO) 500 MG tablet Take 1 tablet (500 mg total) by mouth 2 (two) times daily. One po bid x 7 days  . [DISCONTINUED] oxybutynin (DITROPAN) 5 MG tablet Take 1 tablet (5 mg total) by mouth every 6 (six) hours as needed for bladder spasms.  . [DISCONTINUED] phenazopyridine (PYRIDIUM) 200 MG tablet Take 1 tablet (200 mg total) by mouth 3 (three) times daily as needed for pain.

## 2013-11-06 ENCOUNTER — Encounter (HOSPITAL_COMMUNITY): Payer: Self-pay | Admitting: Pharmacy Technician

## 2013-11-10 ENCOUNTER — Other Ambulatory Visit: Payer: Self-pay | Admitting: Radiology

## 2013-11-12 ENCOUNTER — Other Ambulatory Visit: Payer: Self-pay | Admitting: Radiology

## 2013-11-13 ENCOUNTER — Other Ambulatory Visit: Payer: Self-pay | Admitting: Radiology

## 2013-11-14 ENCOUNTER — Encounter (HOSPITAL_COMMUNITY): Payer: Self-pay

## 2013-11-14 ENCOUNTER — Ambulatory Visit (HOSPITAL_COMMUNITY)
Admission: RE | Admit: 2013-11-14 | Discharge: 2013-11-14 | Disposition: A | Discharge status: Home / Self Care | Payer: Medicare HMO | Source: Ambulatory Visit | Attending: Pulmonary Disease | Admitting: Pulmonary Disease

## 2013-11-14 DIAGNOSIS — Z87891 Personal history of nicotine dependence: Secondary | ICD-10-CM | POA: Insufficient documentation

## 2013-11-14 DIAGNOSIS — D303 Benign neoplasm of bladder: Secondary | ICD-10-CM | POA: Insufficient documentation

## 2013-11-14 DIAGNOSIS — M899 Disorder of bone, unspecified: Secondary | ICD-10-CM | POA: Insufficient documentation

## 2013-11-14 DIAGNOSIS — M949 Disorder of cartilage, unspecified: Secondary | ICD-10-CM

## 2013-11-14 DIAGNOSIS — C419 Malignant neoplasm of bone and articular cartilage, unspecified: Secondary | ICD-10-CM | POA: Insufficient documentation

## 2013-11-14 DIAGNOSIS — R222 Localized swelling, mass and lump, trunk: Secondary | ICD-10-CM

## 2013-11-14 DIAGNOSIS — R911 Solitary pulmonary nodule: Secondary | ICD-10-CM | POA: Insufficient documentation

## 2013-11-14 HISTORY — PX: BONE BIOPSY: SHX375

## 2013-11-14 LAB — CBC WITH DIFFERENTIAL/PLATELET
BASOS PCT: 0 % (ref 0–1)
Basophils Absolute: 0 10*3/uL (ref 0.0–0.1)
EOS ABS: 0.1 10*3/uL (ref 0.0–0.7)
Eosinophils Relative: 3 % (ref 0–5)
HCT: 32.7 % — ABNORMAL LOW (ref 39.0–52.0)
Hemoglobin: 11.1 g/dL — ABNORMAL LOW (ref 13.0–17.0)
Lymphocytes Relative: 35 % (ref 12–46)
Lymphs Abs: 0.9 10*3/uL (ref 0.7–4.0)
MCH: 31.6 pg (ref 26.0–34.0)
MCHC: 33.9 g/dL (ref 30.0–36.0)
MCV: 93.2 fL (ref 78.0–100.0)
MONOS PCT: 13 % — AB (ref 3–12)
Monocytes Absolute: 0.4 10*3/uL (ref 0.1–1.0)
Neutro Abs: 1.3 10*3/uL — ABNORMAL LOW (ref 1.7–7.7)
Neutrophils Relative %: 49 % (ref 43–77)
Platelets: 177 10*3/uL (ref 150–400)
RBC: 3.51 MIL/uL — ABNORMAL LOW (ref 4.22–5.81)
RDW: 16.1 % — ABNORMAL HIGH (ref 11.5–15.5)
WBC: 2.7 10*3/uL — ABNORMAL LOW (ref 4.0–10.5)

## 2013-11-14 LAB — PROTIME-INR
INR: 1.11 (ref 0.00–1.49)
Prothrombin Time: 14.3 seconds (ref 11.6–15.2)

## 2013-11-14 LAB — APTT: APTT: 30 s (ref 24–37)

## 2013-11-14 MED ORDER — HYDROCODONE-ACETAMINOPHEN 5-325 MG PO TABS
1.0000 | ORAL_TABLET | ORAL | Status: DC | PRN
  Filled 2013-11-14: qty 2

## 2013-11-14 MED ORDER — FENTANYL CITRATE 0.05 MG/ML IJ SOLN
INTRAMUSCULAR | Status: AC
  Filled 2013-11-14: qty 6

## 2013-11-14 MED ORDER — MIDAZOLAM HCL 2 MG/2ML IJ SOLN
INTRAMUSCULAR | Status: AC | PRN
  Administered 2013-11-14: 1 mg via INTRAVENOUS

## 2013-11-14 MED ORDER — SODIUM CHLORIDE 0.9 % IV SOLN
INTRAVENOUS | Status: DC
  Administered 2013-11-14: 07:00:00 via INTRAVENOUS

## 2013-11-14 MED ORDER — FENTANYL CITRATE 0.05 MG/ML IJ SOLN
INTRAMUSCULAR | Status: AC | PRN
  Administered 2013-11-14: 50 ug via INTRAVENOUS

## 2013-11-14 MED ORDER — MIDAZOLAM HCL 2 MG/2ML IJ SOLN
INTRAMUSCULAR | Status: AC
  Filled 2013-11-14: qty 6

## 2013-11-14 NOTE — H&P (Signed)
Wesley [name] is an 69 y.o. male.   Chief Complaint: Pt has had hematuria off and on for a few years Worsened recently and was referred to Urology Bladder tumor was biopsied and found to be benign (10/2013) Complained of Rt chest wall tenderness and slightly palpable mass Work up revealed Rt rib mass and pulmonary nodule +PET 10/19/2013- rib lesion Now scheduled for Rt rib mass biopsy  HPI: Bladder tumor- benign; Pulm nodule; rt rib lesion  Past Medical History  Diagnosis Date  . Pulmonary nodule, right     W/  CHEST WALL MASS--  SCHEDULED FOR BIOPSY W/ DR [name] ON FRIDAY 10-27-2013  . History of seizures as a child     AGE 68 OR 8--- NONE ISSUE SINCE  . History of GI bleed     SECONDARY TO ULCER --  DEC 2011  RESOLVED PER LAST EGD 07-05-2010  . Bladder tumor   . Hematuria     Past Surgical History  Procedure Laterality Date  . Excision benign left axilla tumor  1993  . Esophagogastroduodenoscopy  X5   LAST ONE 07-05-2010  . Transurethral resection of bladder tumor with gyrus (turbt-gyrus) N/A 10/25/2013    Procedure: TRANSURETHRAL RESECTION OF BLADDER TUMOR WITH GYRUS (TURBT-GYRUS);  Surgeon: Sharyn Creamer, MD;  Location: [facility];  Service: Urology;  Laterality: N/A;  . Cystoscopy with biopsy N/A 10/25/2013    Procedure: CYSTOSCOPY;  Surgeon: Sharyn Creamer, MD;  Location: [facility];  Service: Urology;  Laterality: N/A;    Family History  Problem Relation Age of Onset  . Heart disease Mother    Social History:  reports that he quit smoking about 44 years ago. His smoking use included Cigarettes. He has a .1 pack-year smoking history. He has never used smokeless tobacco. He reports that he does not drink alcohol or use illicit drugs.  Allergies: No Known Allergies   (Not in a hospital admission)  Results for orders placed during the hospital encounter of 11/14/13 (from the past 48 hour(s))  APTT     Status: None   Collection Time     11/14/13  7:20 AM      Result Value Ref Range   aPTT 30  24 - 37 seconds  CBC WITH DIFFERENTIAL     Status: Abnormal   Collection Time    11/14/13  7:20 AM      Result Value Ref Range   WBC 2.7 (*) 4.0 - 10.5 K/uL   RBC 3.51 (*) 4.22 - 5.81 MIL/uL   Hemoglobin 11.1 (*) 13.0 - 17.0 g/dL   HCT 32.7 (*) 39.0 - 52.0 %   MCV 93.2  78.0 - 100.0 fL   MCH 31.6  26.0 - 34.0 pg   MCHC 33.9  30.0 - 36.0 g/dL   RDW 16.1 (*) 11.5 - 15.5 %   Platelets 177  150 - 400 K/uL   Neutrophils Relative % 49  43 - 77 %   Neutro Abs 1.3 (*) 1.7 - 7.7 K/uL   Lymphocytes Relative 35  12 - 46 %   Lymphs Abs 0.9  0.7 - 4.0 K/uL   Monocytes Relative 13 (*) 3 - 12 %   Monocytes Absolute 0.4  0.1 - 1.0 K/uL   Eosinophils Relative 3  0 - 5 %   Eosinophils Absolute 0.1  0.0 - 0.7 K/uL   Basophils Relative 0  0 - 1 %   Basophils Absolute 0.0  0.0 - 0.1  K/uL  PROTIME-INR     Status: None   Collection Time    11/14/13  7:20 AM      Result Value Ref Range   Prothrombin Time 14.3  11.6 - 15.2 seconds   INR 1.11  0.00 - 1.49   No results found.  Review of Systems  Constitutional: Negative for fever and weight loss.  Respiratory: Negative for cough and shortness of breath.   Cardiovascular: Positive for chest pain.       Rt lateral chest wall tender  Gastrointestinal: Negative for nausea, vomiting and abdominal pain.  Genitourinary: Positive for hematuria.       Still comes and goes  Musculoskeletal: Negative for back pain.  Neurological: Negative for dizziness, weakness and headaches.  Psychiatric/Behavioral: Negative for substance abuse.    Blood pressure 144/88, pulse 62, temperature 97.8 F (36.6 C), temperature source Oral, resp. rate 18, height 5\' 8"  (1.727 m), weight 87.181 kg (192 lb 3.2 oz), SpO2 100.00%. Physical Exam   Assessment/Plan Rt rib lesion +PET Now scheduled for bx of same Pt and wife aware of procedure benefits and risks and agreeable to proceed Consent signed and in  chart  Kristl Morioka A 11/14/2013, 8:29 AM

## 2013-11-14 NOTE — Procedures (Signed)
CT guided core biopsies of right 7th rib lesion.  No immediate complication.

## 2013-11-14 NOTE — Discharge Instructions (Signed)
Needle Biopsy °Care After °These instructions give you information on caring for yourself after your procedure. Your doctor may also give you more specific instructions. Call your doctor if you have any problems or questions after your procedure. °HOME CARE °· Rest for 4 hours after your biopsy, except for getting up to go to the bathroom or as told. °· Keep the places where the needles were put in clean and dry. °¨ Do not put powder or lotion on the sites. °¨ Do not shower until 24 hours after the test. Remove all bandages (dressings) before showering. °¨ Remove all bandages at least once every day. Gently clean the sites with soap and water. Keep putting a new bandage on until the skin is closed. °Finding out the results of your test °Ask your doctor when your test results will be ready. Make sure you follow up and get the test results. °GET HELP RIGHT AWAY IF:  °· You have shortness of breath or trouble breathing. °· You have pain or cramping in your belly (abdomen). °· You feel sick to your stomach (nauseous) or throw up (vomit). °· Any of the places where the needles were put in: °¨ Are puffy (swollen) or red. °¨ Are sore or hot to the touch. °¨ Are draining yellowish-white fluid (pus). °¨ Are bleeding after 10 minutes of pressing down on the site. Have someone keep pressing on any place that is bleeding until you see a doctor. °· You have any unusual pain that will not stop. °· You have a fever. °If you go to the emergency room, tell the nurse that you had a biopsy. Take this paper with you to show the nurse. °MAKE SURE YOU:  °· Understand these instructions. °· Will watch your condition. °· Will get help right away if you are not doing well or get worse. °Document Released: 04/02/2008 Document Revised: 07/13/2011 Document Reviewed: 04/02/2008 °ExitCare® Patient Information ©2015 [employer]. This information is not intended to replace advice given to you by your health care provider. Make sure you discuss  any questions you have with your health care provider. °Conscious Sedation °Sedation is the use of medicines to promote relaxation and relieve discomfort and anxiety. Conscious sedation is a type of sedation. Under conscious sedation you are less alert than normal but are still able to respond to instructions or stimulation. Conscious sedation is used during short medical and dental procedures. It is milder than deep sedation or general anesthesia and allows you to return to your regular activities sooner.  °LET YOUR HEALTH CARE PROVIDER KNOW ABOUT:  °· Any allergies you have. °· All medicines you are taking, including vitamins, herbs, eye drops, creams, and over-the-counter medicines. °· Use of steroids (by mouth or creams). °· Previous problems you or members of your family have had with the use of anesthetics. °· Any blood disorders you have. °· Previous surgeries you have had. °· Medical conditions you have. °· Possibility of pregnancy, if this applies. °· Use of cigarettes, alcohol, or illegal drugs. °RISKS AND COMPLICATIONS °Generally, this is a safe procedure. However, as with any procedure, problems can occur. Possible problems include: °· Oversedation. °· Trouble breathing on your own. You may need to have a breathing tube until you are awake and breathing on your own. °· Allergic reaction to any of the medicines used for the procedure. °BEFORE THE PROCEDURE °· You may have blood tests done. These tests can help show how well your kidneys and liver are working. They can also   show how well your blood clots. °· A physical exam will be done.   °· Only take medicines as directed by your health care provider. You may need to stop taking medicines (such as blood thinners, aspirin, or nonsteroidal anti-inflammatory drugs) before the procedure.   °· Do not eat or drink at least 6 hours before the procedure or as directed by your health care provider. °· Arrange for a responsible adult, family member, or friend to  take you home after the procedure. He or she should stay with you for at least 24 hours after the procedure, until the medicine has worn off. °PROCEDURE  °· An intravenous (IV) catheter will be inserted into one of your veins. Medicine will be able to flow directly into your body through this catheter. You may be given medicine through this tube to help prevent pain and help you relax. °· The medical or dental procedure will be done. °AFTER THE PROCEDURE °· You will stay in a recovery area until the medicine has worn off. Your blood pressure and pulse will be checked.   °·  Depending on the procedure you had, you may be allowed to go home when you can tolerate liquids and your pain is under control. °Document Released: 01/13/2001 Document Revised: 04/25/2013 Document Reviewed: 12/26/2012 °ExitCare® Patient Information ©2015 [employer]. This information is not intended to replace advice given to you by your health care provider. Make sure you discuss any questions you have with your health care provider. ° °Conscious Sedation, Adult, Care After °Refer to this sheet in the next few weeks. These instructions provide you with information on caring for yourself after your procedure. Your health care provider may also give you more specific instructions. Your treatment has been planned according to current medical practices, but problems sometimes occur. Call your health care provider if you have any problems or questions after your procedure. °WHAT TO EXPECT AFTER THE PROCEDURE  °After your procedure: °· You may feel sleepy, clumsy, and have poor balance for several hours. °· Vomiting may occur if you eat too soon after the procedure. °HOME CARE INSTRUCTIONS °· Do not participate in any activities where you could become injured for at least 24 hours. Do not: °¨ Drive. °¨ Swim. °¨ Ride a bicycle. °¨ Operate heavy machinery. °¨ Cook. °¨ Use power tools. °¨ Climb ladders. °¨ Work from a high place. °· Do not make  important decisions or sign legal documents until you are improved. °· If you vomit, drink water, juice, or soup when you can drink without vomiting. Make sure you have little or no nausea before eating solid foods. °· Only take over-the-counter or prescription medicines for pain, discomfort, or fever as directed by your health care provider. °· Make sure you and your family fully understand everything about the medicines given to you, including what side effects may occur. °· You should not drink alcohol, take sleeping pills, or take medicines that cause drowsiness for at least 24 hours. °· If you smoke, do not smoke without supervision. °· If you are feeling better, you may resume normal activities 24 hours after you were sedated. °· Keep all appointments with your health care provider. °SEEK MEDICAL CARE IF: °· Your skin is pale or bluish in color. °· You continue to feel nauseous or vomit. °· Your pain is getting worse and is not helped by medicine. °· You have bleeding or swelling. °· You are still sleepy or feeling clumsy after 24 hours. °SEEK IMMEDIATE MEDICAL CARE IF: °· You develop   a rash. °· You have difficulty breathing. °· You develop any type of allergic problem. °· You have a fever. °MAKE SURE YOU: °· Understand these instructions. °· Will watch your condition. °· Will get help right away if you are not doing well or get worse. °Document Released: 02/08/2013 Document Reviewed: 02/08/2013 °ExitCare® Patient Information ©2015 [employer]. This information is not intended to replace advice given to you by your health care provider. Make sure you discuss any questions you have with your health care provider. ° ° °

## 2013-11-15 ENCOUNTER — Telehealth: Payer: Self-pay | Admitting: Pulmonary Disease

## 2013-11-15 ENCOUNTER — Other Ambulatory Visit: Payer: Self-pay | Admitting: Pulmonary Disease

## 2013-11-15 DIAGNOSIS — R222 Localized swelling, mass and lump, trunk: Secondary | ICD-10-CM

## 2013-11-15 NOTE — Telephone Encounter (Signed)
I called Wesley [name] and his wife to let them know that his biopsy shows malignancy, but at this point the type is uncertain.  They understand that there are more tests pending right now.    I have placed an order for an oncology appointment.

## 2013-11-16 ENCOUNTER — Telehealth: Payer: Self-pay | Admitting: Pulmonary Disease

## 2013-11-16 ENCOUNTER — Telehealth: Payer: Self-pay | Admitting: Internal Medicine

## 2013-11-16 NOTE — Telephone Encounter (Signed)
LEFT MESSAGE FOR PATIENT AND GAVE NP APPT FOR 07/20 @ 1:30 W/DR. MOHAMED.  REFERRING DR. Nathaneil Canary [facility] DX- CHEST WALL MASS.

## 2013-11-16 NOTE — Telephone Encounter (Signed)
Dr. Truman Hayward was transferred to [facility]

## 2013-11-17 ENCOUNTER — Other Ambulatory Visit: Payer: Self-pay | Admitting: *Deleted

## 2013-11-17 DIAGNOSIS — R222 Localized swelling, mass and lump, trunk: Secondary | ICD-10-CM

## 2013-11-17 DIAGNOSIS — R911 Solitary pulmonary nodule: Secondary | ICD-10-CM

## 2013-11-20 ENCOUNTER — Encounter (INDEPENDENT_AMBULATORY_CARE_PROVIDER_SITE_OTHER): Payer: Self-pay

## 2013-11-20 ENCOUNTER — Ambulatory Visit (HOSPITAL_BASED_OUTPATIENT_CLINIC_OR_DEPARTMENT_OTHER): Payer: Medicare HMO | Admitting: Internal Medicine

## 2013-11-20 ENCOUNTER — Encounter: Payer: Self-pay | Admitting: Internal Medicine

## 2013-11-20 ENCOUNTER — Ambulatory Visit: Payer: Medicare HMO

## 2013-11-20 ENCOUNTER — Telehealth: Payer: Self-pay | Admitting: Internal Medicine

## 2013-11-20 ENCOUNTER — Other Ambulatory Visit (HOSPITAL_BASED_OUTPATIENT_CLINIC_OR_DEPARTMENT_OTHER): Payer: Medicare HMO

## 2013-11-20 VITALS — BP 125/78 | HR 69 | Temp 98.1°F | Resp 18 | Ht 68.0 in | Wt 190.2 lb

## 2013-11-20 DIAGNOSIS — R911 Solitary pulmonary nodule: Secondary | ICD-10-CM

## 2013-11-20 DIAGNOSIS — R222 Localized swelling, mass and lump, trunk: Secondary | ICD-10-CM

## 2013-11-20 DIAGNOSIS — C761 Malignant neoplasm of thorax: Secondary | ICD-10-CM

## 2013-11-20 LAB — COMPREHENSIVE METABOLIC PANEL (CC13)
ALT: 12 U/L (ref 0–55)
AST: 14 U/L (ref 5–34)
Albumin: 4.1 g/dL (ref 3.5–5.0)
Alkaline Phosphatase: 55 U/L (ref 40–150)
Anion Gap: 7 mEq/L (ref 3–11)
BUN: 13.5 mg/dL (ref 7.0–26.0)
CO2: 27 mEq/L (ref 22–29)
Calcium: 9.8 mg/dL (ref 8.4–10.4)
Chloride: 105 mEq/L (ref 98–109)
Creatinine: 1.2 mg/dL (ref 0.7–1.3)
Glucose: 96 mg/dl (ref 70–140)
Potassium: 4.2 mEq/L (ref 3.5–5.1)
SODIUM: 139 meq/L (ref 136–145)
TOTAL PROTEIN: 9.2 g/dL — AB (ref 6.4–8.3)
Total Bilirubin: 1.32 mg/dL — ABNORMAL HIGH (ref 0.20–1.20)

## 2013-11-20 LAB — CBC WITH DIFFERENTIAL/PLATELET
BASO%: 0.7 % (ref 0.0–2.0)
Basophils Absolute: 0 10*3/uL (ref 0.0–0.1)
EOS%: 0.8 % (ref 0.0–7.0)
Eosinophils Absolute: 0 10*3/uL (ref 0.0–0.5)
HEMATOCRIT: 33.7 % — AB (ref 38.4–49.9)
HGB: 11 g/dL — ABNORMAL LOW (ref 13.0–17.1)
LYMPH#: 0.9 10*3/uL (ref 0.9–3.3)
LYMPH%: 20.9 % (ref 14.0–49.0)
MCH: 31.2 pg (ref 27.2–33.4)
MCHC: 32.6 g/dL (ref 32.0–36.0)
MCV: 95.7 fL (ref 79.3–98.0)
MONO#: 0.6 10*3/uL (ref 0.1–0.9)
MONO%: 13.8 % (ref 0.0–14.0)
NEUT#: 2.9 10*3/uL (ref 1.5–6.5)
NEUT%: 63.8 % (ref 39.0–75.0)
PLATELETS: 205 10*3/uL (ref 140–400)
RBC: 3.53 10*6/uL — AB (ref 4.20–5.82)
RDW: 17 % — ABNORMAL HIGH (ref 11.0–14.6)
WBC: 4.5 10*3/uL (ref 4.0–10.3)

## 2013-11-20 NOTE — Progress Notes (Signed)
[place] [phone]   [phone]  CONSULT NOTE  REFERRING PHYSICIAN: Dr. Simonne Maffucci  REASON FOR CONSULTATION:   68 years old African American male with right chest wall malignant mass.  HPI Wesley [name] is a 68 y.o. [place] never smoker with past medical history significant for history of seizure disorder when he was 68 years old but none since that time, history of GI bleed, hematuria as well as anemia. The patient was recently notice to have hematuria and he was seen by a urologist for evaluation.he underwent cystoscopy with biopsy of the bladder which showed benign urothelium was no evidence of malignancy.  During his evaluation he had CT scan of the abdomen on 09/27/2013 and incidentally showed 2.8 x 4.2 cm expansile soft tissue lesion is identified in the anterior right seventh rib tracks along the anterior aspect of the liver and has soft tissue extending outside of the bony cortex. No other bony lesions are evident. The patient was referred to Dr. Waunita Schooner who ordered a PET scan and this was performed on 10/27/2013 and it showed The left lower lobe pulmonary nodule measuring 11 mm has very low metabolic activity with SUV max 1.3. No additional pulmonary nodules. No hypermetabolic mediastinal lymph nodes. Along the right chest wall there is expansile lesion within the anterior seventh rib with intense metabolic activity (SUV max = 10.7). Low metabolic activity associated with the bladder / prostate mass also suggest benign etiology. On 11/14/2013 the patient underwent CT-guided biopsy of the right seventh rib lesion by interventional radiology. The final pathology (Accession: [id number]) showed poorly differentiated malignant infiltrate. Sections show sheets of malignant cells with significant nuclear pleomorphism, prominent nucleoli with associated significant mitotic activity. Some binucleated malignant cells are also present. A battery of  immunostains were performed and the tumor cells were completely negative for cytokeratin 7, cytokeratin 20, p63, cytokeratin 903, Napsin-A, TTF-1 CDX-2, and melanoma S100 and Melan-A with appropriate controls.A malignant hematopoietic process cannot be completely excluded. The patient was referred to me today for further evaluation and recommendation regarding treatment of his condition. When seen today he is feeling fine with no specific complaints. He denied having any significant chest pain, shortness of breath, cough or hemoptysis. He denied having any significant weight loss or night sweats. He has no nausea or vomiting. He has no headache or blurry vision. Family history significant for mother who died from heart disease and father died from complications of diabetes mellitus and he also has a brother with colon cancer. The patient is married and has 2 children. He was accompanied by his wife Wesley [name]. He is a retired Administrator. He has a history of smoking for only one year and this was many years ago. He has no history of alcohol or drug abuse.  HPI  Past Medical History  Diagnosis Date  . Pulmonary nodule, right     W/  CHEST WALL MASS--  SCHEDULED FOR BIOPSY W/ DR [name] ON FRIDAY 10-27-2013  . History of seizures as a child     AGE 26 OR 8--- NONE ISSUE SINCE  . History of GI bleed     SECONDARY TO ULCER --  DEC 2011  RESOLVED PER LAST EGD 07-05-2010  . Bladder tumor   . Hematuria     Past Surgical History  Procedure Laterality Date  . Excision benign left axilla tumor  1993  . Esophagogastroduodenoscopy  X5   LAST ONE 07-05-2010  . Transurethral resection of bladder tumor  with gyrus (turbt-gyrus) N/A 10/25/2013    Procedure: TRANSURETHRAL RESECTION OF BLADDER TUMOR WITH GYRUS (TURBT-GYRUS);  Surgeon: Sharyn Creamer, MD;  Location: [facility];  Service: Urology;  Laterality: N/A;  . Cystoscopy with biopsy N/A 10/25/2013    Procedure: CYSTOSCOPY;  Surgeon: Sharyn Creamer, MD;  Location: [facility];  Service: Urology;  Laterality: N/A;    Family History  Problem Relation Age of Onset  . Heart disease Mother     Social History History  Substance Use Topics  . Smoking status: Former Smoker -- 0.10 packs/day for 1 years    Types: Cigarettes    Quit date: 05/04/1969  . Smokeless tobacco: Never Used  . Alcohol Use: No    No Known Allergies  Current Outpatient Prescriptions  Medication Sig Dispense Refill  . oxyCODONE-acetaminophen (PERCOCET/ROXICET) 5-325 MG per tablet Take 1 tablet by mouth.       No current facility-administered medications for this visit.    Review of Systems  Constitutional: negative Eyes: negative Ears, nose, mouth, throat, and face: negative Respiratory: negative Cardiovascular: negative Gastrointestinal: negative Genitourinary:negative Integument/breast: negative Hematologic/lymphatic: negative Musculoskeletal:negative Neurological: negative Behavioral/Psych: negative Endocrine: negative Allergic/Immunologic: negative  Physical Exam  SHF:WYOVZ, healthy, no distress, well nourished and well developed SKIN: skin color, texture, turgor are normal, no rashes or significant lesions HEAD: Normocephalic, No masses, lesions, tenderness or abnormalities EYES: normal, PERRLA EARS: External ears normal, Canals clear OROPHARYNX:no exudate, no erythema and lips, buccal mucosa, and tongue normal  NECK: supple, no adenopathy, no JVD LYMPH:  no palpable lymphadenopathy, no hepatosplenomegaly LUNGS: clear to auscultation , and palpation HEART: regular rate & rhythm, no murmurs and no gallops ABDOMEN:abdomen soft, non-tender, normal bowel sounds and no masses or organomegaly BACK: Back symmetric, no curvature., No CVA tenderness EXTREMITIES:no joint deformities, effusion, or inflammation, no edema, no skin discoloration, no clubbing  NEURO: alert & oriented x 3 with fluent speech, no focal  motor/sensory deficits  PERFORMANCE STATUS: ECOG 0  LABORATORY DATA: Lab Results  Component Value Date   WBC 4.5 11/20/2013   HGB 11.0* 11/20/2013   HCT 33.7* 11/20/2013   MCV 95.7 11/20/2013   PLT 205 11/20/2013      Chemistry      Component Value Date/Time   NA 139 11/20/2013 1334   NA 142 10/25/2013 1238   K 4.2 11/20/2013 1334   K 3.8 10/25/2013 1238   CL 101 10/25/2013 1238   CO2 27 11/20/2013 1334   CO2 29 09/04/2013 0937   BUN 13.5 11/20/2013 1334   BUN 13 10/25/2013 1238   CREATININE 1.2 11/20/2013 1334   CREATININE 0.90 10/25/2013 1238      Component Value Date/Time   CALCIUM 9.8 11/20/2013 1334   CALCIUM 9.3 09/04/2013 0937   ALKPHOS 55 11/20/2013 1334   ALKPHOS 48 09/04/2013 0937   AST 14 11/20/2013 1334   AST 20 09/04/2013 0937   ALT 12 11/20/2013 1334   ALT 16 09/04/2013 0937   BILITOT 1.32* 11/20/2013 1334   BILITOT 1.3* 09/04/2013 0937       RADIOGRAPHIC STUDIES: Nm Pet Image Initial (pi) Skull Base To Thigh  10/27/2013   CLINICAL DATA:  Initial. Treatment strategy for bladder mass and rib lesion.  EXAM: NUCLEAR MEDICINE PET SKULL BASE TO THIGH  TECHNIQUE: 10.1 mCi F-18 FDG was injected intravenously. Full-ring PET imaging was performed from the skull base to thigh after the radiotracer. CT data was obtained and used for attenuation correction and anatomic  localization.  FASTING BLOOD GLUCOSE:  Value: 107 mg/dl  COMPARISON:  CT 09/27/2013  FINDINGS: NECK  No hypermetabolic lymph nodes in the neck.  CHEST  The left lower lobe pulmonary nodule measuring 11 mm (image 48, series 6) has very low metabolic activity with SUV max 1.3. No additional pulmonary nodules. No hypermetabolic mediastinal lymph nodes.  Along the right chest wall there is expansile lesion within the anterior seventh rib with intense metabolic activity (SUV max = 10.7).  ABDOMEN/PELVIS  No abnormal metabolic activity within the liver. Adrenal glands are normal. No abnormal metabolic activity within kidneys. No  hypermetabolic abdominal pelvic lymph nodes.  There is a large mass within the lumen of the bladder measuring 6.6 cm which has low metabolic activity metabolic activity. 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  SKELETON  Skeletal lesion in the right anterior rib mass described above. Metabolic activity associated with the anterior abdominal wall musculature is felt to be physiologic.  IMPRESSION: 1. Expansile hypermetabolic right anterior rib lesion is concerning for a metastatic lesion. 2. Very low metabolic activity associated with the left lower lobe pulmonary nodule suggests benign etiology. 3. Low metabolic activity associated with the bladder / prostate mass also suggest benign etiology.   Electronically Signed   By: Suzy Bouchard M.D.   On: 10/27/2013 08:57   Ct Biopsy  11/14/2013   CLINICAL DATA:  68 year old with a lesion involving the anterior right seventh rib. Tissue diagnosis is needed.  EXAM: CT-GUIDED BIOPSY OF THE RIGHT SEVENTH RIB LESION  Physician: Stephan Minister. Henn, MD  MEDICATIONS: 1 mg Versed, 50 mcg fentanyl. A radiology nurse monitored the patient for moderate sedation.  ANESTHESIA/SEDATION: Sedation time: 15 min  PROCEDURE: The procedure was explained to the patient. The risks and benefits of the procedure were discussed and the patient's questions were addressed. Informed consent was obtained from the patient. Images through the upper abdomen were obtained. Soft tissue lesion surrounding and involving the right anterior seventh rib was identified. The right upper abdomen was prepped with Betadine and a sterile field was created. Using CT guidance, a 17 gauge needle was directed into the soft tissue component. Four core biopsies were obtained with an 18 gauge core device. Specimens were placed in formalin. 17 gauge needle was removed without complication.  FINDINGS: Expansile lesion with a soft tissue component involving the anterior right 7th rib. Needle position confirmed within the lesion.   COMPLICATIONS: None  IMPRESSION: CT-guided core biopsies of the right seventh rib lesion.   Electronically Signed   By: Markus Daft M.D.   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 The pathology is not conclusive and it could be hematopoietic in origin.   PLAN: I had a lengthy discussion with the patient and his wife today about his condition. I showed them the images of the PET scan. I discussed with the patient considering repeat biopsy but I'm not sure if this would be helpful since the previous biopsy was enough for all the immunohistochemical stains. I also discussed with the patient and his wife consideration of curative radiotherapy to this lesion in the right chest wall. The patient is interested in considering radiotherapy and I would make a referral to radiation oncology to see him in the next few days. I would also ordered and myeloma panel to rule out the possibility of plasmacytoma  or underlying multiple myeloma. I would see the patient back for followup visit in 3 months with repeat CT scan of the chest for reevaluation of his disease. I would not be able to consider the patient for systemic therapy without knowing the primary source of his malignancy. The patient and his wife agreed to the current plan. He was advised to call immediately if he has any concerning symptoms in the interval.  The patient voices understanding of current disease status and treatment options and is in agreement with the current care plan.  All questions were answered. The patient knows to call the clinic with any problems, questions or concerns. We can certainly see the patient much sooner if necessary.  Thank you so much for allowing me to participate in the care of [facility]. I will continue to follow up the patient with you and assist in his  care.  I spent 40 minutes counseling the patient face to face. The total time spent in the appointment was 60 minutes.  Disclaimer: This note was dictated with voice recognition software. Similar sounding words can inadvertently be transcribed and may not be corrected upon review.   Ahmed Inniss K. 11/20/2013, 3:22 PM

## 2013-11-20 NOTE — Progress Notes (Signed)
Checked in new patient with no issues prior to seeing the dr. He has not been out of the country and he has the appt card.

## 2013-11-20 NOTE — Telephone Encounter (Signed)
Pt confirmed labs/ov per 07/20 POF, scheduled RO and advised [facility] would call pt w/CT apt same day as labs, gave pt AVS......KJ

## 2013-11-24 ENCOUNTER — Encounter: Payer: Self-pay | Admitting: Radiation Oncology

## 2013-11-24 NOTE — Progress Notes (Signed)
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  During his evaluation he had CT scan of the abdomen on 09/27/2013 and incidentally showed 2.8 x 4.2 cm expansile soft tissue lesion is identified in the anterior right seventh rib tracks along the anterior aspect of the liver and has soft tissue extending outside of the bony cortex. No other bony lesions are evident.  Biopsies revealed:  11/14/13 Diagnosis Bone, biopsy, right 7th rib - POORLY DIFFERENTIATED MALIGNANT INFILTRATES. PLEASE SEE COMMENT.  Tobacco/Marijuana/Snuff/ETOH use: He has a history of smoking for only one year.  He quit in 1971. He has no history of alcohol or drug abuse.  Past/Anticipated interventions by cardiothoracic surgery, if any: 11/14/2013 the patient underwent CT-guided biopsy of the right seventh rib lesion by interventional radiology  Past/Anticipated interventions by medical oncology, if any: none until primary etiology is known.  Signs/Symptoms:  Weight changes, if any: no  Respiratory complaints, if any: no  Hemoptysis, if any: no  Pain issues, if any:  no  SAFETY ISSUES:  Prior radiation? no  Pacemaker/ICD? no   Possible current pregnancy?no  Is the patient on methotrexate? no  Current Complaints / other details:  Patient is here with his wife.  He has 2 children.

## 2013-11-28 ENCOUNTER — Encounter: Payer: Self-pay | Admitting: Pulmonary Disease

## 2013-11-28 ENCOUNTER — Ambulatory Visit (INDEPENDENT_AMBULATORY_CARE_PROVIDER_SITE_OTHER): Payer: Medicare HMO | Admitting: Pulmonary Disease

## 2013-11-28 VITALS — BP 122/80 | HR 66 | Ht 68.0 in | Wt 194.0 lb

## 2013-11-28 DIAGNOSIS — R911 Solitary pulmonary nodule: Secondary | ICD-10-CM

## 2013-11-28 DIAGNOSIS — R222 Localized swelling, mass and lump, trunk: Secondary | ICD-10-CM

## 2013-11-28 NOTE — Patient Instructions (Signed)
Follow up with Dr. [name] as you are doing

## 2013-11-28 NOTE — Progress Notes (Signed)
   Subjective:    Patient ID: Wesley [name], male    DOB: [birth year], 68 y.o.   MRN: [id number] Synopsis:: Wesley [name] was referred to the [facility] pulmonary clinic in June of 2015 when a right-sided chest wall mass in the left pulmonary nodule was discovered on a CT abdomen after he developed gross hematuria. He had a bladder mass biopsy in June of 2015 which was found to be benign. HPI  11/28/2013 ROV > Wesley [name] has seen Dr. [name] and is slotted to see Dr [name].  No breathing problems.  He has not had breathing trouble since the last visit.    Past Medical History  Diagnosis Date  . Pulmonary nodule, right     W/  CHEST WALL MASS--  SCHEDULED FOR BIOPSY W/ DR [name] ON FRIDAY 10-27-2013  . History of seizures as a child     AGE 33 OR 8--- NONE ISSUE SINCE  . History of GI bleed     SECONDARY TO ULCER --  DEC 2011  RESOLVED PER LAST EGD 07-05-2010  . Bladder tumor   . Hematuria      Review of Systems      Objective:   Physical Exam  Filed Vitals:   11/28/13 1541  BP: 122/80  Pulse: 66  Height: 5\' 8"  (1.727 m)  Weight: 194 lb (87.998 kg)  SpO2: 100%   RA  Gen: well appearing, no acute distress HEENT: NCAT, EOMi, OP clear, PULM: CTA B Chest: palpable R sided chest wall mass CV: RRR, no mgr, no JVD AB: BS+, soft, nontender, no hsm Ext: warm, no edema, no clubbing, no cyanosis     Assessment & Plan:   Chest wall mass Start Radiation therapy this week after his initial [place] appointment. Further f/u with oncology  Lung nodule A repeat CT chest has been ordered by oncology for October.  He and his wife asked that this be followed by Oncology to help consolidate all of their doctors' visits and fees.  This is fine by me.  I agree with the plan to follow the nodule per standard Fleischner society criteria.    Updated Medication List Outpatient Encounter Prescriptions as of 11/28/2013  Medication Sig  . oxyCODONE-acetaminophen (PERCOCET/ROXICET) 5-325 MG per tablet  Take 1 tablet by mouth.

## 2013-11-28 NOTE — Assessment & Plan Note (Signed)
A repeat CT chest has been ordered by oncology for October.  He and his wife asked that this be followed by Oncology to help consolidate all of their doctors' visits and fees.  This is fine by me.  I agree with the plan to follow the nodule per standard Fleischner society criteria.

## 2013-11-28 NOTE — Assessment & Plan Note (Signed)
Start Radiation therapy this week after his initial [place] appointment. Further f/u with oncology

## 2013-11-29 ENCOUNTER — Ambulatory Visit
Admission: RE | Admit: 2013-11-29 | Discharge: 2013-11-29 | Disposition: A | Discharge status: Home / Self Care | Payer: Medicare HMO | Source: Ambulatory Visit | Attending: Radiation Oncology | Admitting: Radiation Oncology

## 2013-11-29 ENCOUNTER — Encounter: Payer: Self-pay | Admitting: Radiation Oncology

## 2013-11-29 VITALS — BP 135/86 | HR 66 | Temp 97.9°F | Ht 68.0 in | Wt 193.7 lb

## 2013-11-29 DIAGNOSIS — R911 Solitary pulmonary nodule: Secondary | ICD-10-CM

## 2013-11-29 DIAGNOSIS — C761 Malignant neoplasm of thorax: Secondary | ICD-10-CM | POA: Insufficient documentation

## 2013-11-29 DIAGNOSIS — Z510 Encounter for antineoplastic radiation therapy: Secondary | ICD-10-CM | POA: Insufficient documentation

## 2013-11-29 DIAGNOSIS — Z87891 Personal history of nicotine dependence: Secondary | ICD-10-CM | POA: Insufficient documentation

## 2013-11-29 DIAGNOSIS — R222 Localized swelling, mass and lump, trunk: Secondary | ICD-10-CM

## 2013-11-29 NOTE — Progress Notes (Signed)
Please see the Nurse Progress Note in the MD Initial Consult Encounter for this patient. 

## 2013-11-29 NOTE — Progress Notes (Signed)
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  HISTORY OF PRESENT ILLNESS::Wesley [name] is a 68 y.o. male who is seen out courtesy of Dr. [name] for an opinion concerning radiation therapy as part of management of the patient's recently diagnosed poorly differentiated malignancy presenting in the right chest wall area.  earlier this year the patient presented with hematuria. He during the course of his workup a CT scan of the abdomen was performed which revealed incidentally a 2.8 x 4.2 cm expansile soft tissue lesion along the anterior right sixth rib. The lesion was noted to be anterior to the liver and showed some soft tissue extension.  the patient was seen by Dr. [name] and a PET scan was performed which showed this lesion to be hypermetabolic but no other obvious areas. Patient underwent CT-guided biopsy of the lesion which revealed poorly differentiated malignancy.  extensive tumor markers were performed with no obviously identifiable primary site. With this information the patient is now to be considered for radiation treatment.Marland Kitchen  PREVIOUS RADIATION THERAPY: No  PAST MEDICAL HISTORY:  has a past medical history of Pulmonary nodule, right; History of seizures as a child; History of GI bleed; Bladder tumor; and Hematuria.    PAST SURGICAL HISTORY: Past Surgical History  Procedure Laterality Date  . Excision benign left axilla tumor  1993  . Esophagogastroduodenoscopy  X5   LAST ONE 07-05-2010  . Transurethral resection of bladder tumor with gyrus (turbt-gyrus) N/A 10/25/2013    Procedure: TRANSURETHRAL RESECTION OF BLADDER TUMOR WITH GYRUS  (TURBT-GYRUS);  Surgeon: Sharyn Creamer, MD;  Location: [facility];  Service: Urology;  Laterality: N/A;  . Cystoscopy with biopsy N/A 10/25/2013    Procedure: CYSTOSCOPY;  Surgeon: Sharyn Creamer, MD;  Location: [facility];  Service: Urology;  Laterality: N/A;  . Bone biopsy Right 11/14/13     right seventh rib lesion    FAMILY HISTORY: family history includes Cancer - Prostate in his brother; Heart disease in his mother.  SOCIAL HISTORY:  reports that he quit smoking about 44 years ago. His smoking use included Cigarettes. He has a .1 pack-year smoking history. He has never used smokeless tobacco. He reports that he does not drink alcohol or use illicit drugs.  ALLERGIES: Review of patient's allergies indicates no known allergies.  MEDICATIONS:  Current Outpatient Prescriptions  Medication Sig Dispense Refill  . oxyCODONE-acetaminophen (PERCOCET/ROXICET) 5-325 MG per tablet Take 1 tablet by mouth.       No current facility-administered medications for this encounter.    REVIEW OF SYSTEMS:  A 15 point review of systems is documented in the electronic medical record. This was obtained by the nursing staff. However, I reviewed this with the patient to discuss relevant findings and make appropriate changes. The patient had some mild discomfort after his biopsy but has not had any consistent pain along the right chest area. He denies any cough or breathing problems. Patient denies any other areas of pain. The patient denies any headaches dizziness or blurred vision.   PHYSICAL EXAM:  height is 5\' 8"  (1.727 m) and weight is 193 lb 11.2 oz (87.862 kg). His oral temperature is 97.9 F (36.6 C). His  blood pressure is 135/86 and his pulse is 66. His oxygen saturation is 100%.   BP 135/86  Pulse 66  Temp(Src) 97.9 F (36.6 C) (Oral)  Ht 5\' 8"  (1.727 m)  Wt 193 lb 11.2 oz (87.862 kg)  BMI 29.46 kg/m2  SpO2 100%  General Appearance:    Alert, cooperative, no  distress, appears stated age, accompanied by his wife on evaluation today   Head:    Normocephalic, without obvious abnormality, atraumatic  Eyes:    PERRL, conjunctiva/corneas clear, EOM's intact,         Ears:    Normal TM's and external ear canals, both ears  Nose:   Nares normal, septum midline, mucosa normal, no drainage    or sinus tenderness  Throat:   Lips, mucosa, and tongue normal; dentures in place ,gums normal  Neck:   Supple, symmetrical, trachea midline, no adenopathy;       thyroid:  No enlargement/tenderness/nodules; no carotid   bruit or JVD  Back:     Symmetric, no curvature, ROM normal, no CVA tenderness  Lungs:     Clear to auscultation bilaterally, respirations unlabored  Chest wall:    No tenderness or deformity, slight swelling along the right anterior lower rib cage area but no obvious palpable mass   Heart:    Regular rate and rhythm, S1 and S2 normal, no murmur, rub   or gallop  Abdomen:     Soft, non-tender, bowel sounds active all four quadrants,    no masses, no organomegaly        Extremities:   Extremities normal, atraumatic, no cyanosis or edema  Pulses:   2+ and symmetric all extremities  Skin:   Skin color, texture, turgor normal, no rashes or lesions  Lymph nodes:   Cervical, supraclavicular, and axillary nodes normal  Neurologic:    Normal strength, sensation and reflexes      throughout     ECOG = 0  0 - Asymptomatic (Fully active, able to carry on all predisease activities without restriction)  LABORATORY DATA:  Lab Results  Component Value Date   WBC 4.5 11/20/2013   HGB 11.0* 11/20/2013   HCT 33.7* 11/20/2013   MCV 95.7 11/20/2013   PLT 205 11/20/2013   NEUTROABS 2.9 11/20/2013   Lab Results  Component Value Date   NA 139 11/20/2013   K 4.2 11/20/2013   CL 101 10/25/2013   CO2 27 11/20/2013   GLUCOSE 96 11/20/2013   CREATININE 1.2 11/20/2013   CALCIUM 9.8 11/20/2013      RADIOGRAPHY:  Nm Pet Image Initial (pi) Skull Base To Thigh    10/27/2013 CLINICAL DATA: Initial. Treatment strategy for bladder mass and rib lesion. EXAM: NUCLEAR MEDICINE PET SKULL BASE TO THIGH TECHNIQUE: 10.1 mCi F-18 FDG was injected intravenously. Full-ring PET imaging was performed from the skull base to thigh after the radiotracer. CT data was obtained and used for attenuation correction and anatomic localization. FASTING BLOOD GLUCOSE: Value: 107 mg/dl COMPARISON: CT 09/27/2013 FINDINGS: NECK No hypermetabolic lymph nodes in the neck. CHEST The left lower lobe pulmonary nodule measuring 11 mm (image 48, series 6) has very low metabolic activity with SUV max 1.3. No additional pulmonary nodules. No hypermetabolic mediastinal lymph nodes. Along the right chest wall there is expansile lesion within the anterior seventh rib with intense metabolic activity (SUV max = 10.7). ABDOMEN/PELVIS No abnormal metabolic activity within the liver. Adrenal glands are normal. No abnormal metabolic activity within kidneys. No hypermetabolic abdominal  pelvic lymph nodes. There is a large mass within the lumen of the bladder measuring 6.6 cm which has low metabolic activity metabolic activity. 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 SKELETON Skeletal lesion in the right anterior rib mass described above. Metabolic activity associated with the anterior abdominal wall musculature is felt to be physiologic. IMPRESSION: 1. Expansile hypermetabolic right anterior rib lesion is concerning for a metastatic lesion. 2. Very low metabolic activity associated with the left lower lobe pulmonary nodule suggests benign etiology. 3. Low metabolic activity associated with the bladder / prostate mass also suggest benign etiology. Electronically Signed By: Suzy Bouchard M.D. On: 10/27/2013 08:57   CT biopsy 11/14/2013   CLINICAL DATA:  68 year old with a lesion involving the anterior right seventh rib. Tissue diagnosis is needed.  EXAM: CT-GUIDED BIOPSY OF THE RIGHT SEVENTH RIB LESION  Physician: Stephan Minister.  Henn, MD  MEDICATIONS: 1 mg Versed, 50 mcg fentanyl. A radiology nurse monitored the patient for moderate sedation.  ANESTHESIA/SEDATION: Sedation time: 15 min  PROCEDURE: The procedure was explained to the patient. The risks and benefits of the procedure were discussed and the patient's questions were addressed. Informed consent was obtained from the patient. Images through the upper abdomen were obtained. Soft tissue lesion surrounding and involving the right anterior seventh rib was identified. The right upper abdomen was prepped with Betadine and a sterile field was created. Using CT guidance, a 17 gauge needle was directed into the soft tissue component. Four core biopsies were obtained with an 18 gauge core device. Specimens were placed in formalin. 17 gauge needle was removed without complication.  FINDINGS: Expansile lesion with a soft tissue component involving the anterior right 7th rib. Needle position confirmed within the lesion.  COMPLICATIONS: None  IMPRESSION: CT-guided core biopsies of the right seventh rib lesion.   Electronically Signed   By: Markus Daft M.D.   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 I discussed options for management with the patient. We discussed close followup since patient is asymptomatic. With this approach this lesion will continue to enlarge and become symptomatic and potentially spread to other areas. The other option would be to proceed with an aggressive course of radiation therapy in attempts to place the patient in remission concerning this malignancy. I discussed the treatment course side effects and potential toxicities of radiation therapy in this situation with the patient and his wife. Patient appears to understand and wishes to proceed with planned course of treatment.    PLAN: Simulation and planning later today. I anticipate between 4 and 6 weeks of  radiation therapy   I spent 60 minutes minutes face to face with the patient and more than 50% of that time was spent in counseling and/or coordination of care.   ------------------------------------------------  Blair Promise, PhD, MD

## 2013-12-01 DIAGNOSIS — Z510 Encounter for antineoplastic radiation therapy: Secondary | ICD-10-CM | POA: Diagnosis not present

## 2013-12-01 NOTE — Progress Notes (Signed)
  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   NARRATIVE:  The patient was brought to the [facility].  Identity was confirmed.  All relevant records and images related to the planned course of therapy were reviewed.  The patient freely provided informed written consent to proceed with treatment after reviewing the details related to the planned course of therapy. The consent form was witnessed and verified by the simulation staff.  Then, the patient was set-up in a stable reproducible  supine position for radiation therapy.  CT images were obtained.  Surface markings were placed.  The CT images were loaded into the planning software.  Then the target and avoidance structures were contoured.  Treatment planning then occurred.  The radiation prescription was entered and confirmed.  Then, I designed and supervised the construction of a total of 3 medically necessary complex treatment devices.  I have requested : 3D Simulation  I have requested a DVH of the following structures: GTV, PTV, lungs, liver.  I have ordered:dose calc.  PLAN:  The patient will receive 37.5 Gy in 15 fractions.  ________________________________  -----------------------------------  Blair Promise, PhD, MD

## 2013-12-02 DIAGNOSIS — Z510 Encounter for antineoplastic radiation therapy: Secondary | ICD-10-CM | POA: Diagnosis not present

## 2013-12-06 ENCOUNTER — Ambulatory Visit
Admission: RE | Admit: 2013-12-06 | Discharge: 2013-12-06 | Disposition: A | Discharge status: Home / Self Care | Payer: Medicare HMO | Source: Ambulatory Visit | Attending: Radiation Oncology | Admitting: Radiation Oncology

## 2013-12-06 ENCOUNTER — Encounter: Payer: Self-pay | Admitting: Radiation Oncology

## 2013-12-06 DIAGNOSIS — Z510 Encounter for antineoplastic radiation therapy: Secondary | ICD-10-CM | POA: Diagnosis not present

## 2013-12-06 NOTE — Progress Notes (Signed)
Simulation Verification Note  The patient was brought to the treatment unit and placed in the planned treatment position. The clinical setup was verified. Then port films were obtained and uploaded to the radiation oncology medical record software.  The treatment beams were carefully compared against the planned radiation fields. The position location and shape of the radiation fields was reviewed. They targeted volume of tissue appears to be appropriately covered by the radiation beams. Organs at risk appear to be excluded as planned.  Based on my personal review, I approved the simulation verification. The patient's treatment will proceed as planned.  -----------------------------------  Wesley Agostini, MD  

## 2013-12-07 ENCOUNTER — Ambulatory Visit
Admission: RE | Admit: 2013-12-07 | Discharge: 2013-12-07 | Disposition: A | Discharge status: Home / Self Care | Payer: Medicare HMO | Source: Ambulatory Visit | Attending: Radiation Oncology | Admitting: Radiation Oncology

## 2013-12-07 DIAGNOSIS — Z510 Encounter for antineoplastic radiation therapy: Secondary | ICD-10-CM | POA: Diagnosis not present

## 2013-12-07 DIAGNOSIS — R222 Localized swelling, mass and lump, trunk: Secondary | ICD-10-CM

## 2013-12-07 MED ORDER — RADIAPLEXRX EX GEL
Freq: Once | CUTANEOUS | Status: AC
  Administered 2013-12-07: 18:00:00 via TOPICAL

## 2013-12-07 NOTE — Progress Notes (Signed)
Wesley [name] was given the Radiation Therapy and You book and discussed potential side effects/management of fatigue and skin changes.  He was given radiaplex gel and was instructed to apply it twice a day, after treatment and at bedtime.  He was oriented to the clinic and was educated about under treat day with Dr. [name] on Tuesday's.  He was advised to call with any questions or concerns.

## 2013-12-08 ENCOUNTER — Ambulatory Visit
Admission: RE | Admit: 2013-12-08 | Discharge: 2013-12-08 | Disposition: A | Discharge status: Home / Self Care | Payer: Medicare HMO | Source: Ambulatory Visit | Attending: Radiation Oncology | Admitting: Radiation Oncology

## 2013-12-08 DIAGNOSIS — Z510 Encounter for antineoplastic radiation therapy: Secondary | ICD-10-CM | POA: Diagnosis not present

## 2013-12-11 ENCOUNTER — Ambulatory Visit
Admission: RE | Admit: 2013-12-11 | Discharge: 2013-12-11 | Disposition: A | Discharge status: Home / Self Care | Payer: Medicare HMO | Source: Ambulatory Visit | Attending: Radiation Oncology | Admitting: Radiation Oncology

## 2013-12-11 DIAGNOSIS — Z510 Encounter for antineoplastic radiation therapy: Secondary | ICD-10-CM | POA: Diagnosis not present

## 2013-12-12 ENCOUNTER — Ambulatory Visit
Admission: RE | Admit: 2013-12-12 | Discharge: 2013-12-12 | Disposition: A | Discharge status: Home / Self Care | Payer: Medicare HMO | Source: Ambulatory Visit | Attending: Radiation Oncology | Admitting: Radiation Oncology

## 2013-12-12 ENCOUNTER — Encounter: Payer: Self-pay | Admitting: Radiation Oncology

## 2013-12-12 VITALS — BP 145/81 | HR 80 | Temp 98.4°F | Ht 68.0 in | Wt 195.4 lb

## 2013-12-12 DIAGNOSIS — Z510 Encounter for antineoplastic radiation therapy: Secondary | ICD-10-CM | POA: Diagnosis not present

## 2013-12-12 DIAGNOSIS — R222 Localized swelling, mass and lump, trunk: Secondary | ICD-10-CM

## 2013-12-12 NOTE — Progress Notes (Signed)
Wesley [name] has completed 4/15 fractions to his right chest.  He denies pain, skin irritation and fatigue.  He has not started using radiaplex yet.

## 2013-12-12 NOTE — Progress Notes (Signed)
  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   Current Dose: 10 Gy     Planned Dose:  37.5 Gy  Narrative . . . . . . . . The patient presents for routine under treatment assessment.                                   The patient is without complaint. He denies any pain along the site of treatment                                 Set-up films were reviewed.                                 The chart was checked. Physical Findings. . .  height is 5\' 8"  (1.727 m) and weight is 195 lb 6.4 oz (88.633 kg). His oral temperature is 98.4 F (36.9 C). His blood pressure is 145/81 and his pulse is 80. His oxygen saturation is 100%. . Weight essentially stable.  No significant changes. Impression . . . . . . . The patient is tolerating radiation. Plan . . . . . . . . . . . . Continue treatment as planned.  ________________________________   Blair Promise, PhD, MD

## 2013-12-13 ENCOUNTER — Ambulatory Visit
Admission: RE | Admit: 2013-12-13 | Discharge: 2013-12-13 | Disposition: A | Discharge status: Home / Self Care | Payer: Medicare HMO | Source: Ambulatory Visit | Attending: Radiation Oncology | Admitting: Radiation Oncology

## 2013-12-13 DIAGNOSIS — Z510 Encounter for antineoplastic radiation therapy: Secondary | ICD-10-CM | POA: Diagnosis not present

## 2013-12-14 ENCOUNTER — Ambulatory Visit
Admission: RE | Admit: 2013-12-14 | Discharge: 2013-12-14 | Disposition: A | Discharge status: Home / Self Care | Payer: Medicare HMO | Source: Ambulatory Visit | Attending: Radiation Oncology | Admitting: Radiation Oncology

## 2013-12-14 DIAGNOSIS — Z510 Encounter for antineoplastic radiation therapy: Secondary | ICD-10-CM | POA: Diagnosis not present

## 2013-12-15 ENCOUNTER — Ambulatory Visit
Admission: RE | Admit: 2013-12-15 | Discharge: 2013-12-15 | Disposition: A | Discharge status: Home / Self Care | Payer: Medicare HMO | Source: Ambulatory Visit | Attending: Radiation Oncology | Admitting: Radiation Oncology

## 2013-12-15 DIAGNOSIS — Z510 Encounter for antineoplastic radiation therapy: Secondary | ICD-10-CM | POA: Diagnosis not present

## 2013-12-18 ENCOUNTER — Ambulatory Visit
Admission: RE | Admit: 2013-12-18 | Discharge: 2013-12-18 | Disposition: A | Discharge status: Home / Self Care | Payer: Medicare HMO | Source: Ambulatory Visit | Attending: Radiation Oncology | Admitting: Radiation Oncology

## 2013-12-18 DIAGNOSIS — Z510 Encounter for antineoplastic radiation therapy: Secondary | ICD-10-CM | POA: Diagnosis not present

## 2013-12-19 ENCOUNTER — Ambulatory Visit
Admission: RE | Admit: 2013-12-19 | Discharge: 2013-12-19 | Disposition: A | Discharge status: Home / Self Care | Payer: Medicare HMO | Source: Ambulatory Visit | Attending: Radiation Oncology | Admitting: Radiation Oncology

## 2013-12-19 ENCOUNTER — Encounter: Payer: Self-pay | Admitting: Radiation Oncology

## 2013-12-19 VITALS — BP 132/90 | HR 90 | Temp 99.7°F | Resp 16 | Ht 68.0 in | Wt 194.6 lb

## 2013-12-19 DIAGNOSIS — Z510 Encounter for antineoplastic radiation therapy: Secondary | ICD-10-CM | POA: Diagnosis not present

## 2013-12-19 DIAGNOSIS — R911 Solitary pulmonary nodule: Secondary | ICD-10-CM

## 2013-12-19 NOTE — Progress Notes (Signed)
  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   Current Dose: 22.5 Gy     Planned Dose:  37.5 Gy  Narrative . . . . . . . . The patient presents for routine under treatment assessment.                                   The patient is without complaint. No fatigue or itching in the treatment area                                 Set-up films were reviewed.                                 The chart was checked. Physical Findings. . .  height is 5\' 8"  (1.727 m) and weight is 194 lb 9.6 oz (88.27 kg). His oral temperature is 99.7 F (37.6 C). His blood pressure is 132/90 and his pulse is 90. His respiration is 16 and oxygen saturation is 97%. . Weight essentially stable.  The lungs are clear. The heart has regular rhythm and rate. the abdomen is soft and nontender with normal bowel sounds. Impression . . . . . . . The patient is tolerating radiation. Plan . . . . . . . . . . . . Continue treatment as planned.  ________________________________   Blair Promise, PhD, MD

## 2013-12-19 NOTE — Progress Notes (Signed)
Wesley [name] has completed 9/15 fractions to his right chest.  He denies pain.  He reports having a cold that he got from his wife.  His temperature is elevated at 99.7.  He reports a productive cough with yellow sputum.  He reports slight fatigue.  The skin on his right chest/back is intact.

## 2013-12-20 ENCOUNTER — Ambulatory Visit
Admission: RE | Admit: 2013-12-20 | Discharge: 2013-12-20 | Disposition: A | Discharge status: Home / Self Care | Payer: Medicare HMO | Source: Ambulatory Visit | Attending: Radiation Oncology | Admitting: Radiation Oncology

## 2013-12-20 DIAGNOSIS — Z510 Encounter for antineoplastic radiation therapy: Secondary | ICD-10-CM | POA: Diagnosis not present

## 2013-12-21 ENCOUNTER — Ambulatory Visit
Admission: RE | Admit: 2013-12-21 | Discharge: 2013-12-21 | Disposition: A | Discharge status: Home / Self Care | Payer: Medicare HMO | Source: Ambulatory Visit | Attending: Radiation Oncology | Admitting: Radiation Oncology

## 2013-12-21 DIAGNOSIS — Z510 Encounter for antineoplastic radiation therapy: Secondary | ICD-10-CM | POA: Diagnosis not present

## 2013-12-22 ENCOUNTER — Ambulatory Visit
Admission: RE | Admit: 2013-12-22 | Discharge: 2013-12-22 | Disposition: A | Discharge status: Home / Self Care | Payer: Medicare HMO | Source: Ambulatory Visit | Attending: Radiation Oncology | Admitting: Radiation Oncology

## 2013-12-22 DIAGNOSIS — Z510 Encounter for antineoplastic radiation therapy: Secondary | ICD-10-CM | POA: Diagnosis not present

## 2013-12-25 ENCOUNTER — Ambulatory Visit
Admission: RE | Admit: 2013-12-25 | Discharge: 2013-12-25 | Disposition: A | Discharge status: Home / Self Care | Payer: Medicare HMO | Source: Ambulatory Visit | Attending: Radiation Oncology | Admitting: Radiation Oncology

## 2013-12-25 DIAGNOSIS — Z510 Encounter for antineoplastic radiation therapy: Secondary | ICD-10-CM | POA: Diagnosis not present

## 2013-12-26 ENCOUNTER — Ambulatory Visit
Admission: RE | Admit: 2013-12-26 | Discharge: 2013-12-26 | Disposition: A | Discharge status: Home / Self Care | Payer: Medicare HMO | Source: Ambulatory Visit | Attending: Radiation Oncology | Admitting: Radiation Oncology

## 2013-12-26 ENCOUNTER — Encounter: Payer: Self-pay | Admitting: Radiation Oncology

## 2013-12-26 VITALS — BP 153/94 | HR 68 | Temp 98.2°F | Resp 16 | Ht 68.0 in | Wt 196.6 lb

## 2013-12-26 DIAGNOSIS — R222 Localized swelling, mass and lump, trunk: Secondary | ICD-10-CM

## 2013-12-26 DIAGNOSIS — Z510 Encounter for antineoplastic radiation therapy: Secondary | ICD-10-CM | POA: Diagnosis not present

## 2013-12-26 NOTE — Progress Notes (Signed)
  Radiation Oncology         [phone] ________________________________  Name: Wesley [name] MRN: [id number]  Date: 12/26/2013  DOB: 1945/10/10  Weekly Radiation Therapy Management  Current Dose: 35 Gy     Planned Dose:  37.5 Gy  Narrative . . . . . . . . The patient presents for routine under treatment assessment.                                   The patient is without complaint except for some mild fatigue. He denies any itching or discomfort in the treatment area.                                 Set-up films were reviewed.                                 The chart was checked. Physical Findings. . .  height is 5\' 8"  (1.727 m) and weight is 196 lb 9.6 oz (89.177 kg). His oral temperature is 98.2 F (36.8 C). His blood pressure is 153/94 and his pulse is 68. His respiration is 16 and oxygen saturation is 100%. . Weight essentially stable.  No significant changes. Impression . . . . . . . The patient is tolerating radiation. Plan . . . . . . . . . . . . Continue treatment as planned.  ________________________________   Blair Promise, PhD, MD

## 2013-12-26 NOTE — Progress Notes (Signed)
Wesley [name] has completed 14/15 fractions to his right chest.  He denies pain.  He reports "a little" fatigue.  He reports that he still has a productive cough with yellow sputum from a cold.  The skin on his right/side chest has hyperpigmentation.  Advised him to start using radiaplex.

## 2013-12-27 ENCOUNTER — Ambulatory Visit
Admission: RE | Admit: 2013-12-27 | Discharge: 2013-12-27 | Disposition: A | Discharge status: Home / Self Care | Payer: Medicare HMO | Source: Ambulatory Visit | Attending: Radiation Oncology | Admitting: Radiation Oncology

## 2013-12-27 DIAGNOSIS — Z510 Encounter for antineoplastic radiation therapy: Secondary | ICD-10-CM | POA: Diagnosis not present

## 2013-12-28 ENCOUNTER — Ambulatory Visit: Payer: Medicare HMO

## 2013-12-29 ENCOUNTER — Ambulatory Visit: Payer: Medicare HMO

## 2014-01-01 ENCOUNTER — Ambulatory Visit: Payer: Medicare HMO

## 2014-01-02 ENCOUNTER — Ambulatory Visit: Payer: Medicare HMO

## 2014-01-03 ENCOUNTER — Ambulatory Visit: Payer: Medicare HMO

## 2014-01-04 ENCOUNTER — Ambulatory Visit: Payer: Medicare HMO

## 2014-01-05 ENCOUNTER — Ambulatory Visit: Payer: Medicare HMO

## 2014-01-09 ENCOUNTER — Ambulatory Visit: Payer: Medicare HMO

## 2014-01-10 ENCOUNTER — Ambulatory Visit: Payer: Medicare HMO

## 2014-01-11 ENCOUNTER — Ambulatory Visit: Payer: Medicare HMO

## 2014-01-12 ENCOUNTER — Encounter: Payer: Self-pay | Admitting: Radiation Oncology

## 2014-01-12 NOTE — Progress Notes (Signed)
  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    Indication for treatment:  Definitive treatment, local regional control       Radiation treatment dates:   August 6 through August 26  Site/dose:   Right anterior chest, 37.5 gray and 15 fractions  Beams/energy:   3-D conformal, 10x, 6x beams  Narrative: The patient tolerated radiation treatment relatively well.   He had some mild fatigue  Plan: The patient has completed radiation treatment. The patient will return to radiation oncology clinic for routine followup in one month. I advised them to call or return sooner if they have any questions or concerns related to their recovery or treatment.  -----------------------------------  Blair Promise, PhD, MD

## 2014-01-31 ENCOUNTER — Encounter: Payer: Self-pay | Admitting: Oncology

## 2014-02-01 ENCOUNTER — Ambulatory Visit
Admission: RE | Admit: 2014-02-01 | Discharge: 2014-02-01 | Disposition: A | Discharge status: Home / Self Care | Payer: Medicare HMO | Source: Ambulatory Visit | Attending: Radiation Oncology | Admitting: Radiation Oncology

## 2014-02-01 ENCOUNTER — Encounter: Payer: Self-pay | Admitting: Radiation Oncology

## 2014-02-01 VITALS — BP 150/76 | HR 90 | Temp 98.2°F | Resp 12 | Ht 68.0 in | Wt 195.4 lb

## 2014-02-01 DIAGNOSIS — R911 Solitary pulmonary nodule: Secondary | ICD-10-CM

## 2014-02-01 NOTE — Progress Notes (Signed)
  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   Interval Since Last Radiation:  1  months  Narrative:  The patient returns today for routine follow-up.  He seems to be doing recently well. He does have some pain along the right lower anterior chest with deep inspiration but none at other times. He denies any cough or breathing problems. He denies any hemoptysis. He has some mild fatigue                              ALLERGIES:  has No Known Allergies.  Meds: Current Outpatient Prescriptions  Medication Sig Dispense Refill  . hyaluronate sodium (RADIAPLEXRX) GEL Apply 1 application topically 2 (two) times daily.      Marland Kitchen oxyCODONE-acetaminophen (PERCOCET/ROXICET) 5-325 MG per tablet Take 1 tablet by mouth.       No current facility-administered medications for this encounter.    Physical Findings: The patient is in no acute distress. Patient is alert and oriented.  height is 5\' 8"  (1.727 m) and weight is 195 lb 6.4 oz (88.633 kg). His oral temperature is 98.2 F (36.8 C). His blood pressure is 150/76 and his pulse is 90. His respiration is 12. . No palpable supraclavicular or axillary adenopathy. Lungs are clear to auscultation. The heart has regular rhythm and rate. The right anterior lower chest/upper abdominal area shows some hyperpigmentation changes but no skin breakdown  Lab Findings: Lab Results  Component Value Date   WBC 4.5 11/20/2013   HGB 11.0* 11/20/2013   HCT 33.7* 11/20/2013   MCV 95.7 11/20/2013   PLT 205 11/20/2013      Radiographic Findings: No results found.  Impression:  The patient is recovering from the effects of radiation.  No evidence of recurrence on clinical exam  today.  Plan:  Routine followup in 3 months. In the interim the patient will be seen by medical oncology with a chest CT scan October 20.  ____________________________________ Blair Promise, MD

## 2014-02-01 NOTE — Progress Notes (Signed)
Wesley [name] here for follow up after treatment to Wesley [name] right chest wall.  He reports pain in Wesley [name] right chest that moves from the front to Wesley [name] back.  He said it started after radiation and is worse when taking a deep breath.  He is rating it an 8/10 today.  He is not taking anything for the pain.  He denies coughing.  He reports shortness of breath because of the pain when taking a deep breath.  Wesley [name] oxygen saturation today was 100% on room air.  He denies fatigue.  He does have hyperpigmentation on Wesley [name] right mid chest and right side.  He is using radiaplex prn.

## 2014-02-20 ENCOUNTER — Encounter (HOSPITAL_COMMUNITY): Payer: Self-pay

## 2014-02-20 ENCOUNTER — Ambulatory Visit (HOSPITAL_COMMUNITY)
Admission: RE | Admit: 2014-02-20 | Discharge: 2014-02-20 | Disposition: A | Discharge status: Home / Self Care | Payer: Medicare HMO | Source: Ambulatory Visit | Attending: Internal Medicine | Admitting: Internal Medicine

## 2014-02-20 ENCOUNTER — Other Ambulatory Visit (HOSPITAL_BASED_OUTPATIENT_CLINIC_OR_DEPARTMENT_OTHER): Payer: Medicare HMO

## 2014-02-20 DIAGNOSIS — R911 Solitary pulmonary nodule: Secondary | ICD-10-CM | POA: Insufficient documentation

## 2014-02-20 DIAGNOSIS — R222 Localized swelling, mass and lump, trunk: Secondary | ICD-10-CM | POA: Insufficient documentation

## 2014-02-20 DIAGNOSIS — Z923 Personal history of irradiation: Secondary | ICD-10-CM | POA: Diagnosis not present

## 2014-02-20 DIAGNOSIS — C801 Malignant (primary) neoplasm, unspecified: Secondary | ICD-10-CM

## 2014-02-20 LAB — COMPREHENSIVE METABOLIC PANEL (CC13)
ALBUMIN: 3.9 g/dL (ref 3.5–5.0)
ALT: 15 U/L (ref 0–55)
AST: 15 U/L (ref 5–34)
Alkaline Phosphatase: 68 U/L (ref 40–150)
Anion Gap: 7 mEq/L (ref 3–11)
BILIRUBIN TOTAL: 0.93 mg/dL (ref 0.20–1.20)
BUN: 8.8 mg/dL (ref 7.0–26.0)
CO2: 25 mEq/L (ref 22–29)
Calcium: 9.7 mg/dL (ref 8.4–10.4)
Chloride: 106 mEq/L (ref 98–109)
Creatinine: 0.9 mg/dL (ref 0.7–1.3)
GLUCOSE: 109 mg/dL (ref 70–140)
POTASSIUM: 3.8 meq/L (ref 3.5–5.1)
Sodium: 138 mEq/L (ref 136–145)
Total Protein: 8.6 g/dL — ABNORMAL HIGH (ref 6.4–8.3)

## 2014-02-20 LAB — CBC WITH DIFFERENTIAL/PLATELET
BASO%: 1 % (ref 0.0–2.0)
BASOS ABS: 0 10*3/uL (ref 0.0–0.1)
EOS%: 2.5 % (ref 0.0–7.0)
Eosinophils Absolute: 0.1 10*3/uL (ref 0.0–0.5)
HEMATOCRIT: 34.8 % — AB (ref 38.4–49.9)
HEMOGLOBIN: 11.4 g/dL — AB (ref 13.0–17.1)
LYMPH%: 33.2 % (ref 14.0–49.0)
MCH: 30.2 pg (ref 27.2–33.4)
MCHC: 32.8 g/dL (ref 32.0–36.0)
MCV: 92.1 fL (ref 79.3–98.0)
MONO#: 0.4 10*3/uL (ref 0.1–0.9)
MONO%: 20.1 % — AB (ref 0.0–14.0)
NEUT#: 0.9 10*3/uL — ABNORMAL LOW (ref 1.5–6.5)
NEUT%: 43.2 % (ref 39.0–75.0)
Platelets: 138 10*3/uL — ABNORMAL LOW (ref 140–400)
RBC: 3.78 10*6/uL — ABNORMAL LOW (ref 4.20–5.82)
RDW: 16.4 % — ABNORMAL HIGH (ref 11.0–14.6)
WBC: 2 10*3/uL — ABNORMAL LOW (ref 4.0–10.3)
lymph#: 0.7 10*3/uL — ABNORMAL LOW (ref 0.9–3.3)

## 2014-02-20 LAB — LACTATE DEHYDROGENASE (CC13): LDH: 190 U/L (ref 125–245)

## 2014-02-20 MED ORDER — IOHEXOL 300 MG/ML  SOLN
80.0000 mL | Freq: Once | INTRAMUSCULAR | Status: AC | PRN
  Administered 2014-02-20: 80 mL via INTRAVENOUS

## 2014-02-23 LAB — KAPPA/LAMBDA LIGHT CHAINS
Kappa free light chain: 81.6 mg/dL — ABNORMAL HIGH (ref 0.33–1.94)
Kappa:Lambda Ratio: 86.81 — ABNORMAL HIGH (ref 0.26–1.65)
Lambda Free Lght Chn: 0.94 mg/dL (ref 0.57–2.63)

## 2014-02-23 LAB — SPEP & IFE WITH QIG
ALBUMIN ELP: 50.8 % — AB (ref 55.8–66.1)
ALPHA-2-GLOBULIN: 9.1 % (ref 7.1–11.8)
Alpha-1-Globulin: 4.3 % (ref 2.9–4.9)
Beta 2: 3.5 % (ref 3.2–6.5)
Beta Globulin: 4.3 % — ABNORMAL LOW (ref 4.7–7.2)
GAMMA GLOBULIN: 28 % — AB (ref 11.1–18.8)
IGM, SERUM: 10 mg/dL — AB (ref 41–251)
IgA: 11 mg/dL — ABNORMAL LOW (ref 68–379)
IgG (Immunoglobin G), Serum: 2540 mg/dL — ABNORMAL HIGH (ref 650–1600)
M-Spike, %: 2.18 g/dL
TOTAL PROTEIN, SERUM ELECTROPHOR: 8.6 g/dL — AB (ref 6.0–8.3)

## 2014-02-23 LAB — BETA 2 MICROGLOBULIN, SERUM: Beta-2 Microglobulin: 3.17 mg/L — ABNORMAL HIGH (ref <=2.51)

## 2014-02-28 ENCOUNTER — Ambulatory Visit: Payer: Medicare HMO | Admitting: Internal Medicine

## 2014-03-12 ENCOUNTER — Ambulatory Visit (INDEPENDENT_AMBULATORY_CARE_PROVIDER_SITE_OTHER): Payer: Medicare HMO | Admitting: Internal Medicine

## 2014-03-12 ENCOUNTER — Encounter: Payer: Self-pay | Admitting: Internal Medicine

## 2014-03-12 VITALS — BP 120/84 | HR 88 | Temp 97.7°F | Resp 20 | Ht 68.0 in | Wt 200.0 lb

## 2014-03-12 DIAGNOSIS — Z23 Encounter for immunization: Secondary | ICD-10-CM

## 2014-03-12 DIAGNOSIS — R222 Localized swelling, mass and lump, trunk: Secondary | ICD-10-CM

## 2014-03-12 DIAGNOSIS — C9000 Multiple myeloma not having achieved remission: Secondary | ICD-10-CM | POA: Insufficient documentation

## 2014-03-12 NOTE — Progress Notes (Signed)
Subjective:    Patient ID: Wesley [name], male    DOB: Apr 15, 1946, 68 y.o.   MRN: [id number]  HPI CLINICAL DATA: Right chest wall mass, seventh rib lesion. Unknown primary cancer. Radiation complete in September of 2015. Restaging after radiation. Surgical History of bladder tumor removed.  EXAM: CT CHEST WITH CONTRAST   IMPRESSION: Increasing sclerosis within the anterior right seventh rib lesion with decreasing associated soft tissue mass, findings likely related to interval radiation treatment. No new bony lesion.  Stable left lower lobe pulmonary nodule.  68 year old patient who was seen for a annual exam in May of this year.  He was referred to urology for evaluation of hematuria and an abdominal CT scan revealed a lesion involving the right lung area.  PET scan revealed a hypermetabolic lesion involving the right anterior rib area and otherwise was unremarkable.  He had a CT-guided biopsy performed on July 14 involving the right seventh rib which was positive for malignancy.  A follow-up chest CT last month noted above.  Recent laboratory screen by medical oncology is consistent with  Kappa  light chain myeloma. He has completed radiation therapy in September. He feels quite well without weight loss, bone pain, chest wall pain.  He has no other constitutional complaints  Wt Readings from Last 3 Encounters:  03/12/14 200 lb (90.719 kg)  02/01/14 195 lb 6.4 oz (88.633 kg)  12/26/13 196 lb 9.6 oz (89.177 kg)   There has been some modest weight gain over the past 3 months.  Laboratory studies have revealed moderate anemia but normal calcium and renal function studies.  Total protein is slightly elevated  Apparently the patient has no follow-up with medical oncology.  Review of Systems  Constitutional: Negative for fever, chills, appetite change and fatigue.  HENT: Negative for congestion, dental problem, ear pain, hearing loss, sore throat, tinnitus, trouble swallowing  and voice change.   Eyes: Negative for pain, discharge and visual disturbance.  Respiratory: Negative for cough, chest tightness, wheezing and stridor.   Cardiovascular: Negative for chest pain, palpitations and leg swelling.  Gastrointestinal: Negative for nausea, vomiting, abdominal pain, diarrhea, constipation, blood in stool and abdominal distention.  Genitourinary: Negative for urgency, hematuria, flank pain, discharge, difficulty urinating and genital sores.  Musculoskeletal: Negative for myalgias, back pain, joint swelling, arthralgias, gait problem and neck stiffness.  Skin: Negative for rash.  Neurological: Negative for dizziness, syncope, speech difficulty, weakness, numbness and headaches.  Hematological: Negative for adenopathy. Does not bruise/bleed easily.  Psychiatric/Behavioral: Negative for behavioral problems and dysphoric mood. The patient is not nervous/anxious.        Objective:   Physical Exam  Constitutional: He is oriented to person, place, and time. He appears well-developed.  HENT:  Head: Normocephalic.  Right Ear: External ear normal.  Left Ear: External ear normal.  Eyes: Conjunctivae and EOM are normal.  Neck: Normal range of motion.  Cardiovascular: Normal rate and normal heart sounds.   Pulmonary/Chest: Breath sounds normal. No respiratory distress. He has no wheezes. He has no rales. He exhibits no tenderness.  Abdominal: Bowel sounds are normal.  Musculoskeletal: Normal range of motion. He exhibits no edema or tenderness.  Neurological: He is alert and oriented to person, place, and time.  Psychiatric: He has a normal mood and affect. His behavior is normal.          Assessment & Plan:   Kappa Light chain myeloma.  Will arrange follow-up with medical oncology Benign lung nodule Status post radiation therapy  of lytic lesion right seventh rib Anemia  A prompt oncology referral will be obtained

## 2014-03-12 NOTE — Patient Instructions (Signed)
Return in 6 months for follow-up  Follow-up oncology and pulmonary medicine

## 2014-03-12 NOTE — Progress Notes (Signed)
Pre visit review using our clinic review tool, if applicable. No additional management support is needed unless otherwise documented below in the visit note. 

## 2014-05-17 ENCOUNTER — Telehealth: Payer: Self-pay | Admitting: Oncology

## 2014-05-17 ENCOUNTER — Ambulatory Visit: Payer: Medicare HMO | Attending: Radiation Oncology | Admitting: Radiation Oncology

## 2014-05-17 NOTE — Telephone Encounter (Signed)
Left a message regarding Travarius's follow up appointment with Dr. [name] today.  Requested a return call.

## 2014-05-24 ENCOUNTER — Ambulatory Visit
Admission: RE | Admit: 2014-05-24 | Discharge: 2014-05-24 | Disposition: A | Discharge status: Home / Self Care | Payer: Medicare HMO | Source: Ambulatory Visit | Attending: Radiation Oncology | Admitting: Radiation Oncology

## 2014-05-24 ENCOUNTER — Encounter: Payer: Self-pay | Admitting: Radiation Oncology

## 2014-05-24 VITALS — BP 136/89 | HR 64 | Temp 98.0°F | Resp 16 | Ht 68.0 in | Wt 204.3 lb

## 2014-05-24 DIAGNOSIS — C9000 Multiple myeloma not having achieved remission: Secondary | ICD-10-CM

## 2014-05-24 NOTE — Progress Notes (Signed)
  Radiation Oncology         [phone] ________________________________  Name: Wesley [name] MRN: [id number]  Date: 05/24/2014  DOB: Aug 17, 1945  Follow-Up Visit Note  CC: [name], MD  [name], MD    ICD-9-CM ICD-10-CM   1. Kappa light chain myeloma 203.00 C90.00     Diagnosis:   Multiple myeloma  Interval Since Last Radiation:  4  months  Narrative:  The patient returns today for routine follow-up.  He is doing well at this time. He exercises regularly and walks on a daily basis. He denies any further pain along the right chest wall area. Bloodwork is consistent with multiple myeloma. Patient did undergo a chest CT scan after completion of his radiation therapy which showed shrinkage of the soft tissue component of his chest wall mass with no new problems                              ALLERGIES:  has No Known Allergies.  Meds: Current Outpatient Prescriptions  Medication Sig Dispense Refill  . hyaluronate sodium (RADIAPLEXRX) GEL Apply 1 application topically 2 (two) times daily.     No current facility-administered medications for this encounter.    Physical Findings: The patient is in no acute distress. Patient is alert and oriented.  height is $RemoveB'5\' 8"'hUTvZUoX$  (1.727 m) and weight is 204 lb 4.8 oz (92.67 kg). His oral temperature is 98 F (36.7 C). His blood pressure is 136/89 and his pulse is 64. His respiration is 16 and oxygen saturation is 99%. . No palpable supraclavicular or axillary adenopathy. The lungs are clear to auscultation. The heart has a regular rhythm and rate. Examination of the chest wall area reveals some mild hyperpigmentation changes from his prior radiation therapy. No point tenderness or palpable mass in this area..  Lab Findings: Lab Results  Component Value Date   WBC 2.0* 02/20/2014   HGB 11.4* 02/20/2014   HCT 34.8* 02/20/2014   MCV 92.1 02/20/2014   PLT 138* 02/20/2014    Radiographic Findings: No results  found.  Impression:  The patient is recovering from the effects of radiation.  Chest wall pain resolved with radiation therapy. Favorable response to radiation therapy  Plan:  Routine follow-up in 3 months. I have placed a request for follow-up in medical oncology in light of the patient's blood work consistent with  diagnosis of multiple myeloma.  ____________________________________ Blair Promise, MD

## 2014-05-24 NOTE — Progress Notes (Signed)
Wesley [name] here for follow up after treatment to his right chest wall.  He denies any pain.  He denies pain.  He denies a cough.  He has slight hyperpigmentation on his right chest.

## 2014-08-23 ENCOUNTER — Ambulatory Visit
Admission: RE | Admit: 2014-08-23 | Discharge: 2014-08-23 | Disposition: A | Discharge status: Home / Self Care | Payer: Medicare HMO | Source: Ambulatory Visit | Attending: Radiation Oncology | Admitting: Radiation Oncology

## 2014-08-23 ENCOUNTER — Telehealth: Payer: Self-pay | Admitting: Oncology

## 2014-08-23 ENCOUNTER — Encounter: Payer: Self-pay | Admitting: Radiation Oncology

## 2014-08-23 ENCOUNTER — Telehealth: Payer: Self-pay | Admitting: Internal Medicine

## 2014-08-23 VITALS — BP 156/86 | HR 73 | Temp 98.1°F | Resp 16 | Ht 68.0 in | Wt 203.6 lb

## 2014-08-23 DIAGNOSIS — C9000 Multiple myeloma not having achieved remission: Secondary | ICD-10-CM | POA: Insufficient documentation

## 2014-08-23 DIAGNOSIS — R222 Localized swelling, mass and lump, trunk: Secondary | ICD-10-CM

## 2014-08-23 LAB — CBC WITH DIFFERENTIAL/PLATELET
BASO%: 0.7 % (ref 0.0–2.0)
Basophils Absolute: 0 10*3/uL (ref 0.0–0.1)
EOS%: 3.9 % (ref 0.0–7.0)
Eosinophils Absolute: 0.1 10*3/uL (ref 0.0–0.5)
HCT: 31.4 % — ABNORMAL LOW (ref 38.4–49.9)
HGB: 10.5 g/dL — ABNORMAL LOW (ref 13.0–17.1)
LYMPH%: 24.3 % (ref 14.0–49.0)
MCH: 31 pg (ref 27.2–33.4)
MCHC: 33.3 g/dL (ref 32.0–36.0)
MCV: 93.1 fL (ref 79.3–98.0)
MONO#: 0.4 10*3/uL (ref 0.1–0.9)
MONO%: 14.3 % — AB (ref 0.0–14.0)
NEUT%: 56.8 % (ref 39.0–75.0)
NEUTROS ABS: 1.7 10*3/uL (ref 1.5–6.5)
PLATELETS: 178 10*3/uL (ref 140–400)
RBC: 3.37 10*6/uL — AB (ref 4.20–5.82)
RDW: 17.1 % — AB (ref 11.0–14.6)
WBC: 3 10*3/uL — ABNORMAL LOW (ref 4.0–10.3)
lymph#: 0.7 10*3/uL — ABNORMAL LOW (ref 0.9–3.3)

## 2014-08-23 LAB — COMPREHENSIVE METABOLIC PANEL (CC13)
ALBUMIN: 3.8 g/dL (ref 3.5–5.0)
ALK PHOS: 68 U/L (ref 40–150)
ALT: 9 U/L (ref 0–55)
AST: 14 U/L (ref 5–34)
Anion Gap: 12 mEq/L — ABNORMAL HIGH (ref 3–11)
BUN: 14.7 mg/dL (ref 7.0–26.0)
CO2: 22 meq/L (ref 22–29)
Calcium: 9 mg/dL (ref 8.4–10.4)
Chloride: 104 mEq/L (ref 98–109)
Creatinine: 1.2 mg/dL (ref 0.7–1.3)
EGFR: 69 mL/min/{1.73_m2} — ABNORMAL LOW (ref >90)
GLUCOSE: 114 mg/dL (ref 70–140)
POTASSIUM: 3.8 meq/L (ref 3.5–5.1)
SODIUM: 138 meq/L (ref 136–145)
Total Bilirubin: 0.74 mg/dL (ref 0.20–1.20)
Total Protein: 9 g/dL — ABNORMAL HIGH (ref 6.4–8.3)

## 2014-08-23 NOTE — Progress Notes (Signed)
Bea Graff here for follow up.  He denies pain, coughing and fatigue.  He reports a good appetite.  The skin on his right chest has hyperpigmentation.  He has not seen Dr. [name] yet.  BP 156/86 mmHg  Pulse 73  Temp(Src) 98.1 F (36.7 C) (Oral)  Resp 16  Ht 5\' 8"  (1.727 m)  Wt 203 lb 9.6 oz (92.352 kg)  BMI 30.96 kg/m2  SpO2 100%

## 2014-08-23 NOTE — Telephone Encounter (Signed)
Called Wesley [name] and advised him of his lab results from today.  Advised him that his results show he is a little anemic but otherwise OK per Dr. [name].  Also notified him of his appointment with Dr. [name] on 09/25/14.  Wesley [name] verbalized agreement and understanding.

## 2014-08-23 NOTE — Telephone Encounter (Signed)
lvm for pt regarding to May appt....also lvm for Sherrie with appt d.t

## 2014-08-23 NOTE — Progress Notes (Signed)
  Radiation Oncology         [phone] ________________________________  Name: Wesley [name] MRN: [id number]  Date: 08/23/2014  DOB: [birth year]  Follow-Up Visit Note  CC: Wesley Cowden, MD  Wesley Bears, MD    ICD-9-CM ICD-10-CM   1. Kappa light chain myeloma 203.00 C90.00     Diagnosis: Malignant right chest wall lesion consistent with poorly differentiated malignancy of unclear primary.   Interval Since Last Radiation: 7.5 months       Narrative:  The patient returns today for routine follow-up.  Denies cough, vision changes, headaches, pain, and fatigue. He reports a good appetite. Has good energy levels. He has not seen Wesley [name] yet.   ALLERGIES:  has No Known Allergies.  Meds: Current Outpatient Prescriptions  Medication Sig Dispense Refill  . hyaluronate sodium (RADIAPLEXRX) GEL Apply 1 application topically 2 (two) times daily.     No current facility-administered medications for this encounter.    Physical Findings: The patient is in no acute distress. Patient is alert and oriented.  height is $RemoveB'5\' 8"'RoozsGlK$  (1.727 m) and weight is 203 lb 9.6 oz (92.352 kg). His oral temperature is 98.1 F (36.7 C). His blood pressure is 156/86 and his pulse is 73. His respiration is 16 and oxygen saturation is 100%. .  Lungs are clear. Heart has regular rate and rhythm. No palpable cervical, supraclavicular, or axillary adenopathy. Skin in the treated area has a mild hyperpigmentation change. No burning sensation of the skin.  Lab Findings: Lab Results  Component Value Date   WBC 2.0* 02/20/2014   HGB 11.4* 02/20/2014   HCT 34.8* 02/20/2014   MCV 92.1 02/20/2014   PLT 138* 02/20/2014    Radiographic Findings: No results found.   Impression:  The patient is recovering from the effects of radiation.  Pt is doing well.  Plan:  Schedule a CT Scan to access his response to radiation therapy. Schedule a follow up in 3 months. He will need to follow-up  with medical oncology concerning his multiple myeloma. This document serves as a record of services personally performed by Gery Pray, MD. It was created on his behalf by Darcus Austin, a trained medical scribe. The creation of this record is based on the scribe's personal observations and the provider's statements to them. This document has been checked and approved by the attending provider.  ____________________________________ Gery Pray, MD

## 2014-08-24 ENCOUNTER — Telehealth: Payer: Self-pay | Admitting: *Deleted

## 2014-08-24 NOTE — Telephone Encounter (Signed)
CALLED PATIENT TO INFORM OF LAB, CT AND FU VISIT, SPOKE WITH PATIENT'S WIFE JOANNE AND SHE IS AWARE OF ALL THESE APPTS.

## 2014-09-10 ENCOUNTER — Ambulatory Visit: Payer: Medicare HMO | Admitting: Internal Medicine

## 2014-09-25 ENCOUNTER — Encounter: Payer: Self-pay | Admitting: Internal Medicine

## 2014-09-25 ENCOUNTER — Telehealth: Payer: Self-pay | Admitting: Physician Assistant

## 2014-09-25 ENCOUNTER — Ambulatory Visit (HOSPITAL_BASED_OUTPATIENT_CLINIC_OR_DEPARTMENT_OTHER): Payer: Medicare HMO | Admitting: Internal Medicine

## 2014-09-25 VITALS — BP 145/80 | HR 75 | Temp 98.2°F | Resp 19 | Ht 68.0 in | Wt 201.0 lb

## 2014-09-25 DIAGNOSIS — C768 Malignant neoplasm of other specified ill-defined sites: Secondary | ICD-10-CM | POA: Diagnosis not present

## 2014-09-25 DIAGNOSIS — C9000 Multiple myeloma not having achieved remission: Secondary | ICD-10-CM

## 2014-09-25 NOTE — Telephone Encounter (Signed)
Pt confirmed labs/ov per 05/24 POF, gave pt AVS and Calendar..... KJ °

## 2014-09-25 NOTE — Progress Notes (Signed)
[facility] [phone]   Fax:(336) [place], [place] [address]  DIAGNOSIS: Poorly differentiated malignant infiltrate, with the concern of myeloproliferative disorder involving right anterior chest wall,   PRIOR THERAPY: Curative radiotherapy to the tumor mass at the right anterior chest under the care of Dr. [name] completed in 12/27/2013.  CURRENT THERAPY: None.   INTERVAL HISTORY: Wesley [name] 69 y.o. male returns to the clinic today for follow-up visit. He was seen in September 2015 for evaluation of the right anterior chest wall mass and the biopsy at that time was not conclusive but it showed poorly differentiated malignant infiltrate with a concern of myeloproliferative disorder. I ordered several studies on this patient at that time including myeloma panel that showed elevated free kappa light chain. He was supposed to come back for follow-up visit in 3 months but the patient missed his appointment for the last 9 months and he finally admitted to the clinic today for reevaluation after referral from his primary care physician. He is still in denial that he has any concerning issues and he mentioned that he feels good. He denied having any significant chest pain, shortness breath, cough or hemoptysis. The patient denied having any weight loss or night sweats. He has no nausea or vomiting. Has CBC and comprehensive metabolic panel on 19/75/8832 that showed leukocytopenia as well as anemia and elevated total protein. He is here today for reevaluation.  MEDICAL HISTORY: Past Medical History  Diagnosis Date  . Pulmonary nodule, right     W/  CHEST WALL MASS--  SCHEDULED FOR BIOPSY W/ DR [name] ON FRIDAY 10-27-2013  . History of seizures as a child     AGE 49 OR 8--- NONE ISSUE SINCE  . History of GI bleed     SECONDARY TO ULCER --  DEC 2011  RESOLVED PER LAST EGD 07-05-2010  .  Bladder tumor   . Hematuria   . Radiation 12/07/13-12/27/13    right anterior chest 37.5 gray    ALLERGIES:  has No Known Allergies.  MEDICATIONS:  Current Outpatient Prescriptions  Medication Sig Dispense Refill  . hyaluronate sodium (RADIAPLEXRX) GEL Apply 1 application topically 2 (two) times daily.     No current facility-administered medications for this visit.    SURGICAL HISTORY:  Past Surgical History  Procedure Laterality Date  . Excision benign left axilla tumor  1993  . Esophagogastroduodenoscopy  X5   LAST ONE 07-05-2010  . Transurethral resection of bladder tumor with gyrus (turbt-gyrus) N/A 10/25/2013    Procedure: TRANSURETHRAL RESECTION OF BLADDER TUMOR WITH GYRUS (TURBT-GYRUS);  Surgeon: Sharyn Creamer, MD;  Location: [facility];  Service: Urology;  Laterality: N/A;  . Cystoscopy with biopsy N/A 10/25/2013    Procedure: CYSTOSCOPY;  Surgeon: Sharyn Creamer, MD;  Location: [facility];  Service: Urology;  Laterality: N/A;  . Bone biopsy Right 11/14/13     right seventh rib lesion    REVIEW OF SYSTEMS:  Constitutional: negative Eyes: negative Ears, nose, mouth, throat, and face: negative Respiratory: negative Cardiovascular: negative Gastrointestinal: negative Genitourinary:negative Integument/breast: negative Hematologic/lymphatic: negative Musculoskeletal:negative Neurological: negative Behavioral/Psych: negative Endocrine: negative Allergic/Immunologic: negative   PHYSICAL EXAMINATION: General appearance: alert, cooperative and no distress Head: Normocephalic, without obvious abnormality, atraumatic Neck: no adenopathy, no carotid bruit, supple, symmetrical, trachea midline and thyroid not enlarged, symmetric, no tenderness/mass/nodules Lymph nodes: Cervical, supraclavicular, and axillary nodes normal. Resp:  clear to auscultation bilaterally Back: symmetric, no curvature. ROM normal. No CVA tenderness. Cardio: regular  rate and rhythm, S1, S2 normal, no murmur, click, rub or gallop GI: soft, non-tender; bowel sounds normal; no masses,  no organomegaly Extremities: extremities normal, atraumatic, no cyanosis or edema Neurologic: Alert and oriented X 3, normal strength and tone. Normal symmetric reflexes. Normal coordination and gait  ECOG PERFORMANCE STATUS: 0 - Asymptomatic  Blood pressure 145/80, pulse 75, temperature 98.2 F (36.8 C), temperature source Oral, resp. rate 19, height _0  (1.727 m), weight 201 lb (91.173 kg), SpO2 100 %.  LABORATORY DATA: Lab Results  Component Value Date   WBC 3.0* 08/23/2014   HGB 10.5* 08/23/2014   HCT 31.4* 08/23/2014   MCV 93.1 08/23/2014   PLT 178 08/23/2014      Chemistry      Component Value Date/Time   NA 138 08/23/2014 1433   NA 142 10/25/2013 1238   K 3.8 08/23/2014 1433   K 3.8 10/25/2013 1238   CL 101 10/25/2013 1238   CO2 22 08/23/2014 1433   CO2 29 09/04/2013 0937   BUN 14.7 08/23/2014 1433   BUN 13 10/25/2013 1238   CREATININE 1.2 08/23/2014 1433   CREATININE 0.90 10/25/2013 1238      Component Value Date/Time   CALCIUM 9.0 08/23/2014 1433   CALCIUM 9.3 09/04/2013 0937   ALKPHOS 68 08/23/2014 1433   ALKPHOS 48 09/04/2013 0937   AST 14 08/23/2014 1433   AST 20 09/04/2013 0937   ALT 9 08/23/2014 1433   ALT 16 09/04/2013 0937   BILITOT 0.74 08/23/2014 1433   BILITOT 1.3* 09/04/2013 0937       RADIOGRAPHIC STUDIES: No results found.  ASSESSMENT AND PLAN: This is a very pleasant 69 years old African-American male with poorly differentiated malignant infiltrate concerning for myeloproliferative disorder and specifically multiple myeloma as the patient has evidence for elevated total protein as well as elevated free kappa light chain on previous bloodwork but he was lost to follow-up. I had a lengthy discussion with the patient today about his current condition and further investigation to confirm or rule out the presence of multiple  myeloma. I recommended for the patient to proceed with a bone marrow biopsy and aspirate as well as a skeletal bone survey and repeat myeloma panel. The patient is a still very reluctant to proceed with any form of investigation or testing and he mentions that he feels good and he doesn't want to do any studies at this point. After lengthy discussion with the patient today about his condition and explained to him the complication of multiple myeloma if present and advise him to at least to rule out this diagnosis. I ordered the study but I'm not sure if the patient will proceed with it. I will arrange for him to come back for follow-up visit in one month's for reevaluation and more detailed discussion of his condition based on the biopsy, lab and imaging studies. He was advised to call if he has any concerning symptoms in the interval.  The patient voices understanding of current disease status and treatment options and is in agreement with the current care plan.  All questions were answered. The patient knows to call the clinic with any problems, questions or concerns. We can certainly see the patient much sooner if necessary.  I spent 15 minutes counseling the patient face to face. The total time spent in the appointment was 25 minutes.  Disclaimer: This note was  dictated with voice recognition software. Similar sounding words can inadvertently be transcribed and may not be corrected upon review.       

## 2014-09-27 ENCOUNTER — Ambulatory Visit (INDEPENDENT_AMBULATORY_CARE_PROVIDER_SITE_OTHER): Payer: Medicare HMO | Admitting: Internal Medicine

## 2014-09-27 ENCOUNTER — Encounter: Payer: Self-pay | Admitting: Internal Medicine

## 2014-09-27 VITALS — BP 140/90 | HR 59 | Temp 98.2°F | Resp 20 | Ht 68.0 in | Wt 202.0 lb

## 2014-09-27 DIAGNOSIS — Z23 Encounter for immunization: Secondary | ICD-10-CM

## 2014-09-27 DIAGNOSIS — C9000 Multiple myeloma not having achieved remission: Secondary | ICD-10-CM

## 2014-09-27 NOTE — Progress Notes (Signed)
Subjective:    Patient ID: Wesley [name], male    DOB: [birth year], 69 y.o.   MRN: [id number]  HPI 69 year old patient whose had a recent oncology follow-up.  He is scheduled for further testing including mold marrow biopsy.  He has very little insight into his present diagnosis and has denial.  Compliance and follow-up with oncology.  Discussed at length.  He was encouraged to proceed with bone marrow biopsy as scheduled In general, he feels well.  No constitutional complaints  Past Medical History  Diagnosis Date  . Pulmonary nodule, right     W/  CHEST WALL MASS--  SCHEDULED FOR BIOPSY W/ DR [name] ON FRIDAY 10-27-2013  . History of seizures as a child     AGE 6 OR 8--- NONE ISSUE SINCE  . History of GI bleed     SECONDARY TO ULCER --  DEC 2011  RESOLVED PER LAST EGD 07-05-2010  . Bladder tumor   . Hematuria   . Radiation 12/07/13-12/27/13    right anterior chest 37.5 gray    History   Social History  . Marital Status: Married    Spouse Name: N/A  . Number of Children: 2  . Years of Education: N/A   Occupational History  . retired    Social History Main Topics  . Smoking status: Former Smoker -- 0.10 packs/day for 1 years    Types: Cigarettes    Quit date: 05/04/1969  . Smokeless tobacco: Never Used  . Alcohol Use: No  . Drug Use: No  . Sexual Activity: Not on file   Other Topics Concern  . Not on file   Social History Narrative    Past Surgical History  Procedure Laterality Date  . Excision benign left axilla tumor  1993  . Esophagogastroduodenoscopy  X5   LAST ONE 07-05-2010  . Transurethral resection of bladder tumor with gyrus (turbt-gyrus) N/A 10/25/2013    Procedure: TRANSURETHRAL RESECTION OF BLADDER TUMOR WITH GYRUS (TURBT-GYRUS);  Surgeon: Sharyn Creamer, MD;  Location: [facility];  Service: Urology;  Laterality: N/A;  . Cystoscopy with biopsy N/A 10/25/2013    Procedure: CYSTOSCOPY;  Surgeon: Sharyn Creamer, MD;  Location: [facility];  Service: Urology;  Laterality: N/A;  . Bone biopsy Right 11/14/13     right seventh rib lesion    Family History  Problem Relation Age of Onset  . Heart disease Mother   . Cancer - Prostate Brother     No Known Allergies  Current Outpatient Prescriptions on File Prior to Visit  Medication Sig Dispense Refill  . hyaluronate sodium (RADIAPLEXRX) GEL Apply 1 application topically 2 (two) times daily.     No current facility-administered medications on file prior to visit.    BP 140/90 mmHg  Pulse 59  Temp(Src) 98.2 F (36.8 C) (Oral)  Resp 20  Ht 5' 8" (1.727 m)  Wt 202 lb (91.627 kg)  BMI 30.72 kg/m2  SpO2 98%      Review of Systems  Constitutional: Negative for fever, chills, appetite change and fatigue.  HENT: Negative for congestion, dental problem, ear pain, hearing loss, sore throat, tinnitus, trouble swallowing and voice change.   Eyes: Negative for pain, discharge and visual disturbance.  Respiratory: Negative for cough, chest tightness, wheezing and stridor.   Cardiovascular: Negative for chest pain, palpitations and leg swelling.  Gastrointestinal: Negative for nausea, vomiting, abdominal pain, diarrhea, constipation, blood in stool and abdominal distention.  Genitourinary: Negative for urgency,  hematuria, flank pain, discharge, difficulty urinating and genital sores.  Musculoskeletal: Negative for myalgias, back pain, joint swelling, arthralgias, gait problem and neck stiffness.  Skin: Negative for rash.  Neurological: Negative for dizziness, syncope, speech difficulty, weakness, numbness and headaches.  Hematological: Negative for adenopathy. Does not bruise/bleed easily.  Psychiatric/Behavioral: Negative for behavioral problems and dysphoric mood. The patient is not nervous/anxious.        Objective:   Physical Exam  Constitutional: He is oriented to person, place, and time. He appears well-developed.  HENT:  Head: Normocephalic.    Right Ear: External ear normal.  Left Ear: External ear normal.  Eyes: Conjunctivae and EOM are normal.  Neck: Normal range of motion.  Cardiovascular: Normal rate and normal heart sounds.   Pulmonary/Chest: Breath sounds normal.  Abdominal: Bowel sounds are normal.  Musculoskeletal: Normal range of motion. He exhibits no edema or tenderness.  Neurological: He is alert and oriented to person, place, and time.  Psychiatric: He has a normal mood and affect. His behavior is normal.          Assessment & Plan:   Light chain myeloma Anemia Status post radiotherapy for right chest wall mass  Oncology follow-up as scheduled

## 2014-09-27 NOTE — Patient Instructions (Signed)
Oncology follow-up as scheduled  Call or return to clinic as needed  Return in 6 months for follow-up

## 2014-09-27 NOTE — Progress Notes (Signed)
Pre visit review using our clinic review tool, if applicable. No additional management support is needed unless otherwise documented below in the visit note. 

## 2014-09-28 ENCOUNTER — Other Ambulatory Visit: Payer: Self-pay | Admitting: Radiology

## 2014-09-29 ENCOUNTER — Encounter (HOSPITAL_COMMUNITY): Payer: Self-pay | Admitting: Emergency Medicine

## 2014-09-29 ENCOUNTER — Emergency Department (HOSPITAL_COMMUNITY)
Admission: EM | Admit: 2014-09-29 | Discharge: 2014-09-29 | Disposition: A | Discharge status: Home / Self Care | Payer: Medicare HMO | Attending: Emergency Medicine | Admitting: Emergency Medicine

## 2014-09-29 ENCOUNTER — Emergency Department (HOSPITAL_COMMUNITY): Payer: Medicare HMO

## 2014-09-29 DIAGNOSIS — Y998 Other external cause status: Secondary | ICD-10-CM | POA: Insufficient documentation

## 2014-09-29 DIAGNOSIS — X58XXXA Exposure to other specified factors, initial encounter: Secondary | ICD-10-CM | POA: Insufficient documentation

## 2014-09-29 DIAGNOSIS — Z79899 Other long term (current) drug therapy: Secondary | ICD-10-CM | POA: Insufficient documentation

## 2014-09-29 DIAGNOSIS — Z86018 Personal history of other benign neoplasm: Secondary | ICD-10-CM | POA: Insufficient documentation

## 2014-09-29 DIAGNOSIS — S5291XA Unspecified fracture of right forearm, initial encounter for closed fracture: Secondary | ICD-10-CM

## 2014-09-29 DIAGNOSIS — Y9289 Other specified places as the place of occurrence of the external cause: Secondary | ICD-10-CM | POA: Diagnosis not present

## 2014-09-29 DIAGNOSIS — S52301A Unspecified fracture of shaft of right radius, initial encounter for closed fracture: Secondary | ICD-10-CM | POA: Diagnosis not present

## 2014-09-29 DIAGNOSIS — Z87891 Personal history of nicotine dependence: Secondary | ICD-10-CM | POA: Diagnosis not present

## 2014-09-29 DIAGNOSIS — Z8719 Personal history of other diseases of the digestive system: Secondary | ICD-10-CM | POA: Diagnosis not present

## 2014-09-29 DIAGNOSIS — Y9389 Activity, other specified: Secondary | ICD-10-CM | POA: Insufficient documentation

## 2014-09-29 DIAGNOSIS — S5290XA Unspecified fracture of unspecified forearm, initial encounter for closed fracture: Secondary | ICD-10-CM

## 2014-09-29 DIAGNOSIS — S6991XA Unspecified injury of right wrist, hand and finger(s), initial encounter: Secondary | ICD-10-CM | POA: Diagnosis present

## 2014-09-29 HISTORY — DX: Unspecified fracture of unspecified forearm, initial encounter for closed fracture: S52.90XA

## 2014-09-29 MED ORDER — HYDROCODONE-ACETAMINOPHEN 5-325 MG PO TABS: 1.0000 | ORAL_TABLET | Freq: Four times a day (QID) | ORAL | Status: DC | PRN

## 2014-09-29 MED ORDER — HYDROCODONE-ACETAMINOPHEN 5-325 MG PO TABS
1.0000 | ORAL_TABLET | Freq: Once | ORAL | Status: AC
  Administered 2014-09-29: 1 via ORAL
  Filled 2014-09-29: qty 1

## 2014-09-29 NOTE — ED Provider Notes (Signed)
CSN: [id number]     Arrival date & time 09/29/14  1035 History   First MD Initiated Contact with Patient 09/29/14 1038     Chief Complaint  Patient presents with  . Wrist Pain    pt c/o pain in l/wrist 24 hours after mowing lawn     (Consider location/radiation/quality/duration/timing/severity/associated sxs/prior Treatment) HPI Comments: Patient is a 69 year old male past medical history significant for pulmonary nodule, bladder tumor, status post radiation therapy presented to the emergency department for evaluation of left wrist and forearm pain. He states he felt a popping sensation all riding his lawnmower yesterday. Has tried ice pack ibuprofen with little to no improvement. No history of previous injuries.  Patient is a 69 y.o. male presenting with wrist pain. The history is provided by the patient.  Wrist Pain This is a new problem. The current episode started yesterday. The problem occurs constantly. The problem has been unchanged. Associated symptoms include arthralgias. Pertinent negatives include no chills, fever, numbness, vomiting or weakness. The symptoms are aggravated by twisting. He has tried ice and NSAIDs for the symptoms. The treatment provided mild relief.    Past Medical History  Diagnosis Date  . Pulmonary nodule, right     W/  CHEST WALL MASS--  SCHEDULED FOR BIOPSY W/ DR [name] ON FRIDAY 10-27-2013  . History of seizures as a child     AGE 30 OR 8--- NONE ISSUE SINCE  . History of GI bleed     SECONDARY TO ULCER --  DEC 2011  RESOLVED PER LAST EGD 07-05-2010  . Bladder tumor   . Hematuria   . Radiation 12/07/13-12/27/13    right anterior chest 37.5 gray   Past Surgical History  Procedure Laterality Date  . Excision benign left axilla tumor  1993  . Esophagogastroduodenoscopy  X5   LAST ONE 07-05-2010  . Transurethral resection of bladder tumor with gyrus (turbt-gyrus) N/A 10/25/2013    Procedure: TRANSURETHRAL RESECTION OF BLADDER TUMOR WITH GYRUS  (TURBT-GYRUS);  Surgeon: Sharyn Creamer, MD;  Location: [facility];  Service: Urology;  Laterality: N/A;  . Cystoscopy with biopsy N/A 10/25/2013    Procedure: CYSTOSCOPY;  Surgeon: Sharyn Creamer, MD;  Location: [facility];  Service: Urology;  Laterality: N/A;  . Bone biopsy Right 11/14/13     right seventh rib lesion   Family History  Problem Relation Age of Onset  . Heart disease Mother   . Cancer - Prostate Brother    History  Substance Use Topics  . Smoking status: Former Smoker -- 0.10 packs/day for 1 years    Types: Cigarettes    Quit date: 05/04/1969  . Smokeless tobacco: Never Used  . Alcohol Use: No    Review of Systems  Constitutional: Negative for fever and chills.  Gastrointestinal: Negative for vomiting.  Musculoskeletal: Positive for arthralgias.  Neurological: Negative for weakness and numbness.  All other systems reviewed and are negative.     Allergies  Review of patient's allergies indicates no known allergies.  Home Medications   Prior to Admission medications   Medication Sig Start Date End Date Taking? Authorizing Provider  hyaluronate sodium (RADIAPLEXRX) GEL Apply 1 application topically 2 (two) times daily.    Historical Provider, MD  HYDROcodone-acetaminophen (NORCO/VICODIN) 5-325 MG per tablet Take 1-2 tablets by mouth every 6 (six) hours as needed for moderate pain. 09/29/14   Shaneca Orne, PA-C   BP 161/94 mmHg  Pulse 66  Temp(Src) 97.7 F (36.5 C) (  Oral)  Resp 15  SpO2 100% Physical Exam  Constitutional: He is oriented to person, place, and time. He appears well-developed and well-nourished. No distress.  HENT:  Head: Normocephalic and atraumatic.  Right Ear: External ear normal.  Left Ear: External ear normal.  Nose: Nose normal.  Mouth/Throat: Oropharynx is clear and moist.  Eyes: Conjunctivae are normal.  Neck: Normal range of motion. Neck supple.  No nuchal rigidity.   Cardiovascular:  Normal rate, regular rhythm, normal heart sounds and intact distal pulses.   Pulmonary/Chest: Effort normal and breath sounds normal.  Abdominal: Soft.  Musculoskeletal: He exhibits tenderness.       Right wrist: Normal.       Left wrist: He exhibits decreased range of motion and tenderness. He exhibits no bony tenderness, no swelling, no deformity and no laceration.       Right forearm: Normal.       Left forearm: He exhibits tenderness. He exhibits no bony tenderness, no swelling and no deformity.       Right hand: Normal.       Left hand: Normal.  Neurological: He is alert and oriented to person, place, and time.  Skin: Skin is warm and dry. He is not diaphoretic.  Psychiatric: He has a normal mood and affect.  Nursing note and vitals reviewed.   ED Course  Procedures (including critical care time) Medications  HYDROcodone-acetaminophen (NORCO/VICODIN) 5-325 MG per tablet 1 tablet (not administered)    Labs Review Labs Reviewed - No data to display  Imaging Review Dg Forearm Left  09/29/2014   CLINICAL DATA:  pain in distal radial side of left forearm when riding lawn mower yesterday, states he has had pain in approximate area recently, but states pain escalated yesterday. No injury known. Pt also notices slight numbness in distal phalanges. 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 Pt has a bone biopsy scheduled in the near future.  EXAM: LEFT FOREARM - 2 VIEW  COMPARISON:  None.  FINDINGS: There is a permeative lytic lesion in the midshaft of the radius measured approximately 4.5 cm. There is little periosteal reaction. There is a transverse nondisplaced fracture. No distraction or angulation. No other lytic or sclerotic lesions.  IMPRESSION: 1. 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   Electronically Signed   By: Lucrezia Europe  M.D.   On: 09/29/2014 11:24   Dg Wrist Complete Left  09/29/2014   CLINICAL DATA:  pain in distal radial side of left forearm when riding lawn mower yesterday, states he has had pain in approximate area recently, but states pain escalated yesterday. No injury known. Pt also notices slight numbness in distal phalanges. 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 Pt has a bone biopsy scheduled in the near future.  EXAM: LEFT WRIST - COMPLETE 3+ VIEW  COMPARISON:  None.  FINDINGS: There is no evidence of fracture or dislocation. There is widening of the scapholunate space. Carpal rows are otherwise intact. No lytic or sclerotic lesion. There is no evidence of arthropathy or other focal bone abnormality. Soft tissues are unremarkable.  IMPRESSION: 1. Negative for fracture or other acute bone abnormality. 2. Widening of the scapholunate space suggesting ligamentous disruption.   Electronically Signed   By: Lucrezia Europe M.D.   On: 09/29/2014 11:22  EKG Interpretation None      SPLINT APPLICATION Date/Time: 88:67 AM Authorized by: Baron Sane L Consent: Verbal consent obtained. Risks and benefits: risks, benefits and alternatives were discussed Consent given by: patient Splint applied by: orthopedic technician Location details: left forearm Splint type: sugar tong Supplies used: orthoglass Post-procedure: The splinted body part was neurovascularly unchanged following the procedure. Patient tolerance: Patient tolerated the procedure well with no immediate complications.    MDM   Final diagnoses:  Closed right radial fracture, initial encounter    Filed Vitals:   09/29/14 1040  BP: 161/94  Pulse: 66  Temp: 97.7 F (36.5 C)  Resp: 15   Afebrile, NAD, non-toxic appearing, AAOx4.  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 Neurovascularly intact. Normal  sensation. No evidence of compartment syndrome. Pain managed in ED. Pt advised to follow up with PCP and his oncologist. Patient given sugar tong and sling while in ED, conservative therapy recommended and discussed. Patient will be dc home & is agreeable with above plan.      Baron Sane, PA-C 09/29/14 [place], MD 09/30/14 [phone]

## 2014-09-29 NOTE — ED Notes (Signed)
Pt reports pain in l/wrist and forearm. Pt felt a "popping' sensation in l/wrist while using his riding mower yesterday. Treated with ice pack and Advil. Full ROM of fingers noted

## 2014-09-29 NOTE — Discharge Instructions (Signed)
Please follow up with your primary care physician in 1-2 days. If you do not have one please call the [facility] number listed above. Please follow up with Dr. [name] to schedule a follow up appointment.  Please take pain medication and/or muscle relaxants as prescribed and as needed for pain. Please do not drive on narcotic pain medication or on muscle relaxants. Please read all discharge instructions and return precautions.    Forearm Fracture Your caregiver has diagnosed you as having a broken bone (fracture) of the forearm. This is the part of your arm between the elbow and your wrist. Your forearm is made up of two bones. These are the radius and ulna. A fracture is a break in one or both bones. A cast or splint is used to protect and keep your injured bone from moving. The cast or splint will be on generally for about 5 to 6 weeks, with individual variations. HOME CARE INSTRUCTIONS   Keep the injured part elevated while sitting or lying down. Keeping the injury above the level of your heart (the center of the chest). This will decrease swelling and pain.  Apply ice to the injury for 15-20 minutes, 03-04 times per day while awake, for 2 days. Put the ice in a plastic bag and place a thin towel between the bag of ice and your cast or splint.  If you have a plaster or fiberglass cast:  Do not try to scratch the skin under the cast using sharp or pointed objects.  Check the skin around the cast every day. You may put lotion on any red or sore areas.  Keep your cast dry and clean.  If you have a plaster splint:  Wear the splint as directed.  You may loosen the elastic around the splint if your fingers become numb, tingle, or turn cold or blue.  Do not put pressure on any part of your cast or splint. It may break. Rest your cast only on a pillow the first 24 hours until it is fully hardened.  Your cast or splint can be protected during bathing with a plastic bag. Do  not lower the cast or splint into water.  Only take over-the-counter or prescription medicines for pain, discomfort, or fever as directed by your caregiver. SEEK IMMEDIATE MEDICAL CARE IF:   Your cast gets damaged or breaks.  You have more severe pain or swelling than you did before the cast.  Your skin or nails below the injury turn blue or gray, or feel cold or numb.  There is a bad smell or new stains and/or pus like (purulent) drainage coming from under the cast. MAKE SURE YOU:   Understand these instructions.  Will watch your condition.  Will get help right away if you are not doing well or get worse. Document Released: 04/17/2000 Document Revised: 07/13/2011 Document Reviewed: 12/08/2007 [facility] Patient Information 2015 [place], [place]. This information is not intended to replace advice given to you by your health care provider. Make sure you discuss any questions you have with your health care provider.  Bone Metastases Cancerous growths can begin in any part of the body. The original site of cancer is called the primary tumor or primary cancer (for example, breast cancer). After cancer has developed in one area of the body, cancerous cells from that area can break away and travel through the body's bloodstream. If these cancerous cells begin growing in another place in the body, they are called metastases.  Bone metastases are cancer cells that have spread to the bone (which is different from a cancer that starts in the bone). These secondary growths are like the original tumor. For example, if a prostate cancer spreads to bone it is called metastatic prostate cancer, or prostate cancer metastatic to bone, but not bone cancer. Cancers can spread to almost any bone; the spine and pelvis are often involved.  Any type of cancer can spread to the bone, but the most common are breast, lung, kidney, thyroid and prostate cancers. Sometimes the primary tumor is not discovered until there are  bone problems. If the primary cancer location cannot be discovered, the cancer is called cancer of unknown primary location. SYMPTOMS  Pain in the bones is the main symptom of bone metastases. Some other problems may occur first including:  Decreased appetite.  Nausea.  Muscle weakness.  Confusion.  Unusual sleep patterns due to discomfort.  Overly tired (fatigue).  Restlessness. Frail or brittle bones may lead to broken bones (fractures) that lead to learning what is wrong (diagnosis). A tumor often weakens the bones.  DIAGNOSIS  Metastatic cancers may be found months or years after or at the same time as the primary tumor. When a second tumor is found in a patient who has been treated for cancer, it is more often a metastasis than another primary tumor.  The patient's symptoms, physical examination, X-rays and blood tests may suggest a bone metastases. In addition, an examination of tissue or a cell sample (biopsy) is usually done to find the cancer. This sample is removed with a needle. This tissue sample must be looked at under a microscope to confirm a diagnosis. TREATMENT  Options generally include treatments that give relief from symptoms (palliative) or curative. Those with advanced, metastasized cancer may receive treatment focused on pain relief and prolonging life. These treatments depend on the type of cancer and its location.  Treatment for cancer depends on its type and location. Some of these treatments are:  Surgery to remove the original tumor and/or to remove parts of the body that produce hormones and other chemicals that make cancer worse.  Treatment with drugs (chemotherapy).  Bone marrow transplantations on rare occasions.  Radiation therapy (radiotherapy).  Hormonal therapy.  Pain relieving medications. Your caregiver will help you understand the likelihood that any particular treatment will be helpful for you. While some treatments aim to cure or control  the cancer, others give relief from symptoms only. If you have bone metastases, radiation therapy may be recommended to treat pain (if it is in one main location). Pain medications are available. These include strong medicines like morphine. You may be instructed to take a long-acting pain medication (to control most of your pain) and a short-acting medication to control occasional flares of pain. Pain medication is sometimes also given continuously through a pump. HOME CARE INSTRUCTIONS   Take medications exactly as prescribed.  Keep any follow-up appointments.  Pain medications can make you sleepy or confused. Do not drive, climb ladders, or do other dangerous activities while on pain medication.  Pain medications often cause constipation. Ask your caregiver for information on stool softeners.  Do not share your pain medication with others. SEEK MEDICAL CARE IF:   Your bone pain is not controlled.  You are having problems or side effects from your medication.  You have excessive sleepiness or confusion. SEEK IMMEDIATE MEDICAL CARE IF:   You fall and have any injury or pain from the fall.  You have trouble walking.  You have numbness or tingling in your legs.  You develop a sudden significant worsening of your pain. Document Released: 04/10/2002 Document Revised: 07/13/2011 Document Reviewed: 12/02/2007 [facility] Patient Information 2015 [place], [place]. This information is not intended to replace advice given to you by your health care provider. Make sure you discuss any questions you have with your health care provider.

## 2014-10-02 ENCOUNTER — Encounter (HOSPITAL_COMMUNITY): Payer: Self-pay

## 2014-10-02 ENCOUNTER — Ambulatory Visit (HOSPITAL_COMMUNITY)
Admission: RE | Admit: 2014-10-02 | Discharge: 2014-10-02 | Disposition: A | Discharge status: Home / Self Care | Payer: Medicare HMO | Source: Ambulatory Visit | Attending: Internal Medicine | Admitting: Internal Medicine

## 2014-10-02 DIAGNOSIS — C9000 Multiple myeloma not having achieved remission: Secondary | ICD-10-CM | POA: Insufficient documentation

## 2014-10-02 HISTORY — DX: Unspecified fracture of unspecified forearm, initial encounter for closed fracture: S52.90XA

## 2014-10-02 LAB — PROTIME-INR
INR: 1.13 (ref 0.00–1.49)
PROTHROMBIN TIME: 14.7 s (ref 11.6–15.2)

## 2014-10-02 LAB — APTT: aPTT: 34 seconds (ref 24–37)

## 2014-10-02 LAB — CBC
HEMATOCRIT: 30.5 % — AB (ref 39.0–52.0)
HEMOGLOBIN: 10.3 g/dL — AB (ref 13.0–17.0)
MCH: 31.3 pg (ref 26.0–34.0)
MCHC: 33.8 g/dL (ref 30.0–36.0)
MCV: 92.7 fL (ref 78.0–100.0)
Platelets: 152 10*3/uL (ref 150–400)
RBC: 3.29 MIL/uL — ABNORMAL LOW (ref 4.22–5.81)
RDW: 15.6 % — AB (ref 11.5–15.5)
WBC: 2.6 10*3/uL — ABNORMAL LOW (ref 4.0–10.5)

## 2014-10-02 LAB — BONE MARROW EXAM

## 2014-10-02 MED ORDER — FENTANYL CITRATE (PF) 100 MCG/2ML IJ SOLN
INTRAMUSCULAR | Status: AC
  Filled 2014-10-02: qty 6

## 2014-10-02 MED ORDER — SODIUM CHLORIDE 0.9 % IV SOLN
INTRAVENOUS | Status: DC
  Administered 2014-10-02: 07:00:00 via INTRAVENOUS

## 2014-10-02 MED ORDER — MIDAZOLAM HCL 2 MG/2ML IJ SOLN
INTRAMUSCULAR | Status: AC
  Filled 2014-10-02: qty 6

## 2014-10-02 MED ORDER — MIDAZOLAM HCL 2 MG/2ML IJ SOLN
INTRAMUSCULAR | Status: AC | PRN
  Administered 2014-10-02: 1 mg via INTRAVENOUS

## 2014-10-02 MED ORDER — FENTANYL CITRATE (PF) 100 MCG/2ML IJ SOLN
INTRAMUSCULAR | Status: AC | PRN
  Administered 2014-10-02: 50 ug via INTRAVENOUS

## 2014-10-02 MED ORDER — HYDROCODONE-ACETAMINOPHEN 5-325 MG PO TABS
1.0000 | ORAL_TABLET | ORAL | Status: DC | PRN
  Filled 2014-10-02: qty 2

## 2014-10-02 NOTE — Discharge Instructions (Signed)
Bone Marrow Aspiration, Bone Marrow Biopsy °Care After °Read the instructions outlined below and refer to this sheet in the next few weeks. These discharge instructions provide you with general information on caring for yourself after you leave the hospital. Your caregiver may also give you specific instructions. While your treatment has been planned according to the most current medical practices available, unavoidable complications occasionally occur. If you have any problems or questions after discharge, call your caregiver. °FINDING OUT THE RESULTS OF YOUR TEST °Not all test results are available during your visit. If your test results are not back during the visit, make an appointment with your caregiver to find out the results. Do not assume everything is normal if you have not heard from your caregiver or the medical facility. It is important for you to follow up on all of your test results.  °HOME CARE INSTRUCTIONS  °You have had sedation and may be sleepy or dizzy. Your thinking may not be as clear as usual. For the next 24 hours: °· Only take over-the-counter or prescription medicines for pain, discomfort, and or fever as directed by your caregiver. °· Do not drink alcohol. °· Do not smoke. °· Do not drive. °· Do not make important legal decisions. °· Do not operate heavy machinery. °· Do not care for small children by yourself. °· Keep your dressing clean and dry. You may replace dressing with a bandage after 24 hours. °· You may take a bath or shower after 24 hours. °· Use an ice pack for 20 minutes every 2 hours while awake for pain as needed. °SEEK MEDICAL CARE IF:  °· There is redness, swelling, or increasing pain at the biopsy site. °· There is pus coming from the biopsy site. °· There is drainage from a biopsy site lasting longer than one day. °· An unexplained oral temperature above 102° F (38.9° C) develops. °SEEK IMMEDIATE MEDICAL CARE IF:  °· You develop a rash. °· You have difficulty  breathing. °· You develop any reaction or side effects to medications given. °Document Released: 11/07/2004 Document Revised: 07/13/2011 Document Reviewed: 04/17/2008 °[employer]® Patient Information ©2015 [employer]. This information is not intended to replace advice given to you by your health care provider. Make sure you discuss any questions you have with your health care provider. °Conscious Sedation, Adult, Care After °Refer to this sheet in the next few weeks. These instructions provide you with information on caring for yourself after your procedure. Your health care provider may also give you more specific instructions. Your treatment has been planned according to current medical practices, but problems sometimes occur. Call your health care provider if you have any problems or questions after your procedure. °WHAT TO EXPECT AFTER THE PROCEDURE  °After your procedure: °· You may feel sleepy, clumsy, and have poor balance for several hours. °· Vomiting may occur if you eat too soon after the procedure. °HOME CARE INSTRUCTIONS °· Do not participate in any activities where you could become injured for at least 24 hours. Do not: °¨ Drive. °¨ Swim. °¨ Ride a bicycle. °¨ Operate heavy machinery. °¨ Cook. °¨ Use power tools. °¨ Climb ladders. °¨ Work from a high place. °· Do not make important decisions or sign legal documents until you are improved. °· If you vomit, drink water, juice, or soup when you can drink without vomiting. Make sure you have little or no nausea before eating solid foods. °· Only take over-the-counter or prescription medicines for pain, discomfort, or fever   as directed by your health care provider.  Make sure you and your family fully understand everything about the medicines given to you, including what side effects may occur.  You should not drink alcohol, take sleeping pills, or take medicines that cause drowsiness for at least 24 hours.  If you smoke, do not smoke without  supervision.  If you are feeling better, you may resume normal activities 24 hours after you were sedated.  Keep all appointments with your health care provider. SEEK MEDICAL CARE IF:  Your skin is pale or bluish in color.  You continue to feel nauseous or vomit.  Your pain is getting worse and is not helped by medicine.  You have bleeding or swelling.  You are still sleepy or feeling clumsy after 24 hours. SEEK IMMEDIATE MEDICAL CARE IF:  You develop a rash.  You have difficulty breathing.  You develop any type of allergic problem.  You have a fever. MAKE SURE YOU:  Understand these instructions.  Will watch your condition.  Will get help right away if you are not doing well or get worse. Document Released: 02/08/2013 Document Reviewed: 02/08/2013 [facility] Patient Information 2015 [place], [place]. This information is not intended to replace advice given to you by your health care provider. Make sure you discuss any questions you have with your health care provider.

## 2014-10-02 NOTE — Procedures (Signed)
CT-guided  R iliac bone marrow aspiration and core biopsy No complication No blood loss. See complete dictation in [facility] PACS  

## 2014-10-02 NOTE — H&P (Signed)
Chief Complaint: Cancer Anemia, concern for Multiple Myeloma   Referring Physician(s): Mohamed,Mohamed  History of Present Illness: Wesley [name] is a 69 y.o. male with poorly differentiated malignant infiltrate concerning for myeloproliferative disorder and specifically multiple myeloma  Dr. [name] has recommended for the patient to proceed with a bone marrow biopsy and aspirate.    Past Medical History  Diagnosis Date  . Pulmonary nodule, right     W/  CHEST WALL MASS--  SCHEDULED FOR BIOPSY W/ DR [name] ON FRIDAY 10-27-2013  . History of seizures as a child     AGE 7 OR 8--- NONE ISSUE SINCE  . History of GI bleed     SECONDARY TO ULCER --  DEC 2011  RESOLVED PER LAST EGD 07-05-2010  . Bladder tumor   . Hematuria   . Radiation 12/07/13-12/27/13    right anterior chest 37.5 gray  . Fracture of radius 09/29/14    left    Past Surgical History  Procedure Laterality Date  . Excision benign left axilla tumor  1993  . Esophagogastroduodenoscopy  X5   LAST ONE 07-05-2010  . Transurethral resection of bladder tumor with gyrus (turbt-gyrus) N/A 10/25/2013    Procedure: TRANSURETHRAL RESECTION OF BLADDER TUMOR WITH GYRUS (TURBT-GYRUS);  Surgeon: Sharyn Creamer, MD;  Location: [facility];  Service: Urology;  Laterality: N/A;  . Cystoscopy with biopsy N/A 10/25/2013    Procedure: CYSTOSCOPY;  Surgeon: Sharyn Creamer, MD;  Location: [facility];  Service: Urology;  Laterality: N/A;  . Bone biopsy Right 11/14/13     right seventh rib lesion    Allergies: Review of patient's allergies indicates no known allergies.  Medications: Prior to Admission medications   Medication Sig Start Date End Date Taking? Authorizing Provider  Aspirin-Acetaminophen-Caffeine 713 705 [id number] MG PACK Take 1 packet by mouth every 4 (four) hours as needed (for pain).   Yes Historical Provider, MD  hyaluronate sodium (RADIAPLEXRX) GEL Apply 1 application topically 2  (two) times daily.   Yes Historical Provider, MD  HYDROcodone-acetaminophen (NORCO/VICODIN) 5-325 MG per tablet Take 1-2 tablets by mouth every 6 (six) hours as needed for moderate pain. Patient not taking: Reported on 10/02/2014 09/29/14   Baron Sane, PA-C     Family History  Problem Relation Age of Onset  . Heart disease Mother   . Cancer - Prostate Brother     History   Social History  . Marital Status: Married    Spouse Name: N/A  . Number of Children: 2  . Years of Education: N/A   Occupational History  . retired    Social History Main Topics  . Smoking status: Former Smoker -- 0.10 packs/day for 1 years    Types: Cigarettes    Quit date: 05/04/1969  . Smokeless tobacco: Never Used  . Alcohol Use: No  . Drug Use: No  . Sexual Activity: Not on file   Other Topics Concern  . None   Social History Narrative     Review of Systems: A 12 point ROS discussed and pertinent positives are indicated in the HPI above.  All other systems are negative.  Review of Systems  Constitutional: Negative for fever, chills, activity change, appetite change and fatigue.  Respiratory: Negative for cough, chest tightness and shortness of breath.   Cardiovascular: Negative for chest pain.  Gastrointestinal: Negative for nausea, vomiting and abdominal pain.  Musculoskeletal: Negative for back pain, arthralgias and gait problem.  Skin: Negative.   Neurological:  Negative.   Psychiatric/Behavioral: Negative.     Vital Signs: BP 153/93 mmHg  Pulse 64  Temp(Src) 98.3 F (36.8 C) (Oral)  Resp 16  Ht $R'5\' 8"'VA$  (1.727 m)  Wt 202 lb (91.627 kg)  BMI 30.72 kg/m2  SpO2 99%  Physical Exam  Constitutional: He is oriented to person, place, and time. He appears well-developed and well-nourished.  HENT:  Head: Normocephalic and atraumatic.  Eyes: EOM are normal.  Neck: Neck supple. No thyromegaly present.  Cardiovascular: Normal rate, regular rhythm and normal heart sounds.   No  murmur heard. Pulmonary/Chest: Effort normal and breath sounds normal. He has no wheezes.  Abdominal: Soft. Bowel sounds are normal. He exhibits no distension. There is no tenderness.  Neurological: He is alert and oriented to person, place, and time.  Skin: Skin is warm and dry.  Psychiatric: He has a normal mood and affect. His behavior is normal. Judgment and thought content normal.  Vitals reviewed.   Mallampati Score:  MD Evaluation Airway: WNL Heart: WNL Abdomen: WNL Chest/ Lungs: WNL ASA  Classification: 3 Mallampati/Airway Score: Two  Imaging: Dg Forearm Left  09/29/2014   CLINICAL DATA:  pain in distal radial side of left forearm when riding lawn mower yesterday, states he has had pain in approximate area recently, but states pain escalated yesterday. No injury known. Pt also notices slight numbness in distal phalanges. 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 Pt has a bone biopsy scheduled in the near future.  EXAM: LEFT FOREARM - 2 VIEW  COMPARISON:  None.  FINDINGS: There is a permeative lytic lesion in the midshaft of the radius measured approximately 4.5 cm. There is little periosteal reaction. There is a transverse nondisplaced fracture. No distraction or angulation. No other lytic or sclerotic lesions.  IMPRESSION: 1. 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   Electronically Signed   By: Lucrezia Europe M.D.   On: 09/29/2014 11:24   Dg Wrist Complete Left  09/29/2014   CLINICAL DATA:  pain in distal radial side of left forearm when riding lawn mower yesterday, states he has had pain in approximate area recently, but states pain escalated yesterday. No injury known. Pt also notices slight numbness in distal phalanges. 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 Pt has a bone biopsy scheduled in the near future.  EXAM: LEFT WRIST - COMPLETE 3+ VIEW  COMPARISON:  None.  FINDINGS: There is no evidence of fracture or dislocation. There is widening of the scapholunate space. Carpal rows are otherwise intact. No lytic or sclerotic lesion. There is no evidence of arthropathy or other focal bone abnormality. Soft tissues are unremarkable.  IMPRESSION: 1. Negative for fracture or other acute bone abnormality. 2. Widening of the scapholunate space suggesting ligamentous disruption.   Electronically Signed   By: Lucrezia Europe M.D.   On: 09/29/2014 11:22    Labs:  CBC:  Recent Labs  11/20/13 1334 02/20/14 0857 08/23/14 1433 10/02/14 0730  WBC 4.5 2.0* 3.0* 2.6*  HGB 11.0* 11.4* 10.5* 10.3*  HCT 33.7* 34.8* 31.4* 30.5*  PLT 205 138* 178 152    COAGS:  Recent Labs  11/14/13 0720 10/02/14 0730  INR 1.11 1.13  APTT 30 34    BMP:  Recent Labs  10/25/13 1238 11/20/13 1334 02/20/14 0858  08/23/14 1433  NA 142 139 138 138  K 3.8 4.2 3.8 3.8  CL 101  --   --   --   CO2  --  $R'27 25 22  'mQ$ GLUCOSE 98 96 109 114  BUN 13 13.5 8.8 14.7  CALCIUM  --  9.8 9.7 9.0  CREATININE 0.90 1.2 0.9 1.2    LIVER FUNCTION TESTS:  Recent Labs  11/20/13 1334 02/20/14 0858 08/23/14 1433  BILITOT 1.32* 0.93 0.74  AST $Re'14 15 14  'dzx$ ALT $R'12 15 9  'qL$ ALKPHOS 55 68 68  PROT 9.2* 8.6* 9.0*  ALBUMIN 4.1 3.9 3.8    TUMOR MARKERS: No results for input(s): AFPTM, CEA, CA199, CHROMGRNA in the last 8760 hours.  Assessment and Plan:  Wesley [name] is a 69 y.o. male with poorly differentiated malignant infiltrate concerning for myeloproliferative disorder and specifically multiple myeloma  Will proceed with a bone marrow biopsy and aspirate today by Dr. Vernard Gambles  Risks and Benefits discussed with the patient including, but not limited to bleeding, infection, damage to adjacent structures or low yield requiring additional tests. All of the patient's questions were  answered, patient is agreeable to proceed. Consent signed and in chart.   Thank you for this interesting consult.  I greatly enjoyed meeting Wesley [name] and look forward to participating in their care.  SignedNevin Bloodgood 10/02/2014, 8:18 AM   I spent a total of 20 Minutes   in face to face in clinical consultation, greater than 50% of which was counseling/coordinating care for Image Guided Bone marrow Biopsy

## 2014-10-03 ENCOUNTER — Telehealth: Payer: Self-pay | Admitting: Internal Medicine

## 2014-10-03 NOTE — Telephone Encounter (Signed)
returned call and lvm for pt confirming appt °

## 2014-10-04 ENCOUNTER — Ambulatory Visit (HOSPITAL_COMMUNITY)
Admission: RE | Admit: 2014-10-04 | Discharge: 2014-10-04 | Disposition: A | Discharge status: Home / Self Care | Payer: Medicare HMO | Source: Ambulatory Visit | Attending: Internal Medicine | Admitting: Internal Medicine

## 2014-10-04 DIAGNOSIS — M84433A Pathological fracture, right radius, initial encounter for fracture: Secondary | ICD-10-CM | POA: Diagnosis not present

## 2014-10-04 DIAGNOSIS — C9030 Solitary plasmacytoma not having achieved remission: Secondary | ICD-10-CM | POA: Diagnosis not present

## 2014-10-04 DIAGNOSIS — C9000 Multiple myeloma not having achieved remission: Secondary | ICD-10-CM

## 2014-10-11 ENCOUNTER — Telehealth: Payer: Self-pay | Admitting: *Deleted

## 2014-10-11 LAB — TISSUE HYBRIDIZATION (BONE MARROW)-NCBH

## 2014-10-11 LAB — CHROMOSOME ANALYSIS, BONE MARROW

## 2014-10-11 NOTE — Telephone Encounter (Signed)
DR.GRAVES WOULD LIKE TO DISCUSS PT.'S CARE WITH DR.MOHAMED. PHONE NUMBER [phone].

## 2014-10-12 ENCOUNTER — Other Ambulatory Visit (HOSPITAL_BASED_OUTPATIENT_CLINIC_OR_DEPARTMENT_OTHER): Payer: Medicare HMO

## 2014-10-12 DIAGNOSIS — C9000 Multiple myeloma not having achieved remission: Secondary | ICD-10-CM

## 2014-10-12 DIAGNOSIS — C768 Malignant neoplasm of other specified ill-defined sites: Secondary | ICD-10-CM | POA: Diagnosis not present

## 2014-10-12 LAB — CBC WITH DIFFERENTIAL/PLATELET
BASO%: 0.8 % (ref 0.0–2.0)
Basophils Absolute: 0 10*3/uL (ref 0.0–0.1)
EOS ABS: 0.1 10*3/uL (ref 0.0–0.5)
EOS%: 2 % (ref 0.0–7.0)
HCT: 30.6 % — ABNORMAL LOW (ref 38.4–49.9)
HGB: 10.2 g/dL — ABNORMAL LOW (ref 13.0–17.1)
LYMPH#: 0.6 10*3/uL — AB (ref 0.9–3.3)
LYMPH%: 24.7 % (ref 14.0–49.0)
MCH: 31.3 pg (ref 27.2–33.4)
MCHC: 33.4 g/dL (ref 32.0–36.0)
MCV: 93.9 fL (ref 79.3–98.0)
MONO#: 0.4 10*3/uL (ref 0.1–0.9)
MONO%: 16.2 % — ABNORMAL HIGH (ref 0.0–14.0)
NEUT#: 1.4 10*3/uL — ABNORMAL LOW (ref 1.5–6.5)
NEUT%: 56.3 % (ref 39.0–75.0)
PLATELETS: 163 10*3/uL (ref 140–400)
RBC: 3.25 10*6/uL — ABNORMAL LOW (ref 4.20–5.82)
RDW: 17 % — ABNORMAL HIGH (ref 11.0–14.6)
WBC: 2.5 10*3/uL — ABNORMAL LOW (ref 4.0–10.3)

## 2014-10-12 LAB — COMPREHENSIVE METABOLIC PANEL (CC13)
ALT: 11 U/L (ref 0–55)
ANION GAP: 8 meq/L (ref 3–11)
AST: 14 U/L (ref 5–34)
Albumin: 3.8 g/dL (ref 3.5–5.0)
Alkaline Phosphatase: 63 U/L (ref 40–150)
BILIRUBIN TOTAL: 1.02 mg/dL (ref 0.20–1.20)
BUN: 13.6 mg/dL (ref 7.0–26.0)
CO2: 24 mEq/L (ref 22–29)
Calcium: 9.2 mg/dL (ref 8.4–10.4)
Chloride: 105 mEq/L (ref 98–109)
Creatinine: 1.3 mg/dL (ref 0.7–1.3)
EGFR: 63 mL/min/{1.73_m2} — AB (ref >90)
Glucose: 106 mg/dl (ref 70–140)
Potassium: 4.1 mEq/L (ref 3.5–5.1)
Sodium: 137 mEq/L (ref 136–145)
TOTAL PROTEIN: 8.9 g/dL — AB (ref 6.4–8.3)

## 2014-10-12 LAB — LACTATE DEHYDROGENASE (CC13): LDH: 241 U/L (ref 125–245)

## 2014-10-15 LAB — KAPPA/LAMBDA LIGHT CHAINS
KAPPA LAMBDA RATIO: 127.5 — AB (ref 0.26–1.65)
Kappa free light chain: 91.8 mg/dL — ABNORMAL HIGH (ref 0.33–1.94)
Lambda Free Lght Chn: 0.72 mg/dL (ref 0.57–2.63)

## 2014-10-15 LAB — BETA 2 MICROGLOBULIN, SERUM: Beta-2 Microglobulin: 3.37 mg/L — ABNORMAL HIGH (ref <=2.51)

## 2014-10-15 LAB — IGG, IGA, IGM
IGA: 8 mg/dL — AB (ref 68–379)
IGG (IMMUNOGLOBIN G), SERUM: 3410 mg/dL — AB (ref 650–1600)
IgM, Serum: 20 mg/dL — ABNORMAL LOW (ref 41–251)

## 2014-10-16 ENCOUNTER — Ambulatory Visit (HOSPITAL_BASED_OUTPATIENT_CLINIC_OR_DEPARTMENT_OTHER): Payer: Medicare HMO | Admitting: Physician Assistant

## 2014-10-16 ENCOUNTER — Encounter: Payer: Self-pay | Admitting: Physician Assistant

## 2014-10-16 ENCOUNTER — Telehealth: Payer: Self-pay | Admitting: Internal Medicine

## 2014-10-16 VITALS — BP 151/88 | HR 73 | Temp 98.3°F | Resp 19 | Ht 68.0 in | Wt 201.0 lb

## 2014-10-16 DIAGNOSIS — C9000 Multiple myeloma not having achieved remission: Secondary | ICD-10-CM | POA: Diagnosis not present

## 2014-10-16 NOTE — Progress Notes (Signed)
[facility] [phone]   Fax:([phone]) [place], [place] [address]  DIAGNOSIS: Poorly differentiated malignant infiltrate, with the concern of myeloproliferative disorder involving right anterior chest wall,   PRIOR THERAPY: Curative radiotherapy to the tumor mass at the right anterior chest under the care of Dr. [name] completed in 12/27/2013.  CURRENT THERAPY: None.   INTERVAL HISTORY: Kayden Hutmacher 69 y.o. male returns to the clinic today for follow-up visit accompanied by his wife. He had a bone marrow biopsy performed on 10/02/2014 as well as a bone survey on 10/04/2014 and presents to discuss the results. He was seen in September 2015 for evaluation of the right anterior chest wall mass and the biopsy at that time was not conclusive but it showed poorly differentiated malignant infiltrate with a concern of myeloproliferative disorder. Dr. [name]  ordered several studies on this patient at that time including myeloma panel that showed elevated free kappa light chain. He was supposed to come back for follow-up visit in 3 months but the patient missed his appointment for the last 9 months and he finally returned to the clinic today for reevaluation after referral from his primary care physician. He is still in denial that he has any concerning issues and he mentioned that he feels good. He denied having any significant chest pain, shortness breath, cough or hemoptysis. The patient denied having any weight loss or night sweats. He has no nausea or vomiting. Has CBC and comprehensive metabolic panel on 29/79/8921 that showed leukocytopenia as well as anemia and elevated total protein. He had a repeat myeloma panel performed on 10/12/2014 in addition to the bone marrow biopsy and bone survey and is here to discuss those results. He was also found to have a lytic lesion in the left forearm with  evidence for impending fracture. Dr. [name] is spoken with the orthopedist and is in agreement with rod fixation. Currently the left forearm is splinted and in a sling.  MEDICAL HISTORY: Past Medical History  Diagnosis Date  . Pulmonary nodule, right     W/  CHEST WALL MASS--  SCHEDULED FOR BIOPSY W/ DR [name] ON FRIDAY 10-27-2013  . History of seizures as a child     AGE 4 OR 8--- NONE ISSUE SINCE  . History of GI bleed     SECONDARY TO ULCER --  DEC 2011  RESOLVED PER LAST EGD 07-05-2010  . Bladder tumor   . Hematuria   . Radiation 12/07/13-12/27/13    right anterior chest 37.5 gray  . Fracture of radius 09/29/14    left    ALLERGIES:  has No Known Allergies.  MEDICATIONS:  Current Outpatient Prescriptions  Medication Sig Dispense Refill  . Aspirin-Acetaminophen-Caffeine 500-325-65 MG PACK Take 1 packet by mouth every 4 (four) hours as needed (for pain).    . hyaluronate sodium (RADIAPLEXRX) GEL Apply 1 application topically 2 (two) times daily.    Marland Kitchen HYDROcodone-acetaminophen (NORCO/VICODIN) 5-325 MG per tablet Take 1-2 tablets by mouth every 6 (six) hours as needed for moderate pain. (Patient not taking: Reported on 10/16/2014) 30 tablet 0   No current facility-administered medications for this visit.    SURGICAL HISTORY:  Past Surgical History  Procedure Laterality Date  . Excision benign left axilla tumor  1993  . Esophagogastroduodenoscopy  X5   LAST ONE 07-05-2010  . Transurethral resection of bladder tumor with gyrus (turbt-gyrus) N/A 10/25/2013  Procedure: TRANSURETHRAL RESECTION OF BLADDER TUMOR WITH GYRUS (TURBT-GYRUS);  Surgeon: Magdalene Molly, MD;  Location: [facility];  Service: Urology;  Laterality: N/A;  . Cystoscopy with biopsy N/A 10/25/2013    Procedure: CYSTOSCOPY;  Surgeon: Magdalene Molly, MD;  Location: [facility];  Service: Urology;  Laterality: N/A;  . Bone biopsy Right 11/14/13     right seventh rib lesion     REVIEW OF SYSTEMS:  Constitutional: negative Eyes: negative Ears, nose, mouth, throat, and face: negative Respiratory: negative Cardiovascular: negative Gastrointestinal: negative Genitourinary:negative Integument/breast: negative Hematologic/lymphatic: negative Musculoskeletal:positive for bone pain Neurological: negative Behavioral/Psych: negative Endocrine: negative Allergic/Immunologic: negative   PHYSICAL EXAMINATION: General appearance: alert, cooperative and no distress Head: Normocephalic, without obvious abnormality, atraumatic Neck: no adenopathy, no carotid bruit, supple, symmetrical, trachea midline and thyroid not enlarged, symmetric, no tenderness/mass/nodules Lymph nodes: Cervical, supraclavicular, and axillary nodes normal. Resp: clear to auscultation bilaterally Back: symmetric, no curvature. ROM normal. No CVA tenderness. Cardio: regular rate and rhythm, S1, S2 normal, no murmur, click, rub or gallop GI: soft, non-tender; bowel sounds normal; no masses,  no organomegaly Extremities: left forearm splinted, wrapped and in a sling Neurologic: Alert and oriented X 3, normal strength and tone. Normal symmetric reflexes. Normal coordination and gait  ECOG PERFORMANCE STATUS: 0 - Asymptomatic  Blood pressure 151/88, pulse 73, temperature 98.3 F (36.8 C), temperature source Oral, resp. rate 19, height 5\' 8"  (1.727 m), weight 201 lb (91.173 kg), SpO2 99 %.  LABORATORY DATA: Lab Results  Component Value Date   WBC 2.5* 10/12/2014   HGB 10.2* 10/12/2014   HCT 30.6* 10/12/2014   MCV 93.9 10/12/2014   PLT 163 10/12/2014      Chemistry      Component Value Date/Time   NA 137 10/12/2014 0935   NA 142 10/25/2013 1238   K 4.1 10/12/2014 0935   K 3.8 10/25/2013 1238   CL 101 10/25/2013 1238   CO2 24 10/12/2014 0935   CO2 29 09/04/2013 0937   BUN 13.6 10/12/2014 0935   BUN 13 10/25/2013 1238   CREATININE 1.3 10/12/2014 0935   CREATININE 0.90 10/25/2013 1238       Component Value Date/Time   CALCIUM 9.2 10/12/2014 0935   CALCIUM 9.3 09/04/2013 0937   ALKPHOS 63 10/12/2014 0935   ALKPHOS 48 09/04/2013 0937   AST 14 10/12/2014 0935   AST 20 09/04/2013 0937   ALT 11 10/12/2014 0935   ALT 16 09/04/2013 0937   BILITOT 1.02 10/12/2014 0935   BILITOT 1.3* 09/04/2013 0937       RADIOGRAPHIC STUDIES: Dg Forearm Left  09/29/2014   CLINICAL DATA:  pain in distal radial side of left forearm when riding lawn mower yesterday, states he has had pain in approximate area recently, but states pain escalated yesterday. No injury known. Pt also notices slight numbness in distal phalanges. 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 Pt has a bone biopsy scheduled in the near future.  EXAM: LEFT FOREARM - 2 VIEW  COMPARISON:  None.  FINDINGS: There is a permeative lytic lesion in the midshaft of the radius measured approximately 4.5 cm. There is little periosteal reaction. There is a transverse nondisplaced fracture. No distraction or angulation. No other lytic or sclerotic lesions.  IMPRESSION: 1. 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   Electronically Signed  By: Lucrezia Europe M.D.   On: 09/29/2014 11:24   Dg Wrist Complete Left  09/29/2014   CLINICAL DATA:  pain in distal radial side of left forearm when riding lawn mower yesterday, states he has had pain in approximate area recently, but states pain escalated yesterday. No injury known. Pt also notices slight numbness in distal phalanges. 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 Pt has a bone biopsy scheduled in the near future.  EXAM: LEFT WRIST - COMPLETE 3+ VIEW  COMPARISON:  None.  FINDINGS: There is no evidence of fracture or dislocation. There is widening of the scapholunate space.  Carpal rows are otherwise intact. No lytic or sclerotic lesion. There is no evidence of arthropathy or other focal bone abnormality. Soft tissues are unremarkable.  IMPRESSION: 1. Negative for fracture or other acute bone abnormality. 2. Widening of the scapholunate space suggesting ligamentous disruption.   Electronically Signed   By: Lucrezia Europe M.D.   On: 09/29/2014 11:22   Ct Biopsy  10/02/2014   CLINICAL DATA:  Kappa light chain myeloma  EXAM: CT GUIDED DEEP ILIAC BONE ASPIRATION AND CORE BIOPSY  TECHNIQUE: The procedure, risks (including but not limited to bleeding, infection, organ damage ), benefits, and alternatives were explained to the patient. Questions regarding the procedure were encouraged and answered. The patient understands and consents to the procedure. Patient was placed supine on the CT gantry and limited axial scans through the pelvis were obtained. Appropriate skin entry site was identified. Skin site was marked, prepped with Betadine, draped in usual sterile fashion, and infiltrated locally with 1% lidocaine.  Intravenous Fentanyl and Versed were administered as conscious sedation during continuous cardiorespiratory monitoring by the radiology RN, with a total moderate sedation time of 7 minutes.  Under CT fluoroscopic guidance an 11-gauge Cook trocar bone needle was advanced into the right iliac bone just lateral to the sacroiliac joint. Once needle tip position was confirmed, coaxial core and aspiration samples were obtained. The final sample was obtained using the guiding needle itself, which was then removed. Post procedure scans show no hematoma or fracture. Patient tolerated procedure well.  COMPLICATIONS: COMPLICATIONS none  IMPRESSION: 1. Technically successful CT guided right iliac bone core and aspiration biopsy.   Electronically Signed   By: Lucrezia Europe M.D.   On: 10/02/2014 10:40   Dg Bone Survey Met  10/04/2014   CLINICAL DATA:  Suspect multiple myeloma, increased pain over  the distal left forearm without significant injury with discovery of a pathologic fracture through the mid radial shaft.  EXAM: METASTATIC BONE SURVEY  COMPARISON:  Left forearm of Sep 29, 2014  FINDINGS: Calvarium: There is a subtle lucency projecting in the posterior aspect of the left parietal bone measuring approximately 7 x 10 mm.  Spine: There is mild multilevel degenerative disc disease of the cervical spine. The vertebral bodies are preserved in height. The thoracic spine exhibits no compression fracture or lytic or blastic lesions. The lumbar spine exhibits disc space narrowing at L3-4. There is no high-grade compression though there is mild loss of the body of L4. No lytic lesions are demonstrated.  Chest: The lungs are adequately inflated. There is a pleural-based soft tissue mass with underlying rib destruction projecting over the posterior lateral right ribcage. This involves the sixth and seventh ribs laterally. The heart pulmonary vascularity and mediastinum are unremarkable.  The shoulders and upper extremities: There is mild degenerative  change of the right shoulder with narrowing of the subacromial subdeltoid space. There is lucency in the subarticular bone of the greater tuberosity on the right which is nonspecific. There is subtle cortical bone loss over the lateral aspect of the distal right humeral metadiaphysis. There is a known left radial midshaft pathologic fracture. The adjacent ulna is intact. The right radius and ulna are intact.  Pelvis and lower extremities: The bony pelvis is adequately mineralized. The sacrum and SI joints are unremarkable. The hips, femurs, and tibias and fibulas are intact.  IMPRESSION: 1. Known pathologic fracture of the midshaft of the left radius. Subtle lytic lesion of the distal right humeral meta- diaphysis. Subarticular lucency in the greater tuberosity of the right humerus is noted as well. These could reflect myelomatous foci. 2. Pleural based soft tissue  mass in the lower right lateral hemi thorax with underlying rib destruction is worrisome for plasmacytoma or other neoplasm. 3. No suspicious bony lesions demonstrated elsewhere. Subtle lucency over the posterior left parietal bone is nonspecific but could reflect a myelomatous focus.   Electronically Signed   By: David  Martinique M.D.   On: 10/04/2014 09:27    ASSESSMENT AND PLAN: This is a very pleasant 69 years old African-American male with poorly differentiated malignant infiltrate concerning for myeloproliferative disorder and specifically multiple myeloma as the patient has evidence for elevated total protein as well as elevated free kappa light chain on previous bloodwork but he was lost to follow-up.  Patient is a pleasant 69 year old African-American male with IgM multiple myeloma with elevated Kappa free light chain. He also has an elevated Kapp:Lambda ratio. The bone marrow biopsy had 20-30% monoclonal plasma cytosis. Patient was discussed with and also seen by Dr. [name]. His bone survey revealed a very large lytic lesion affecting the left humerus with evidence of impending fracture as well as a subtle lytic lesion in the distal right humeral meta-diaphysis there are cysts of articular lucency in the greater tuberosity of the right humerus as well. Pleural-based soft tissue mass in the right lateral hemithorax with underlying rib destruction worrisome for plasmacytoma or other neoplasm. The images were reviewed with the patient and his wife. He was encouraged to move forward with rod and fixation of his left humerus to avoid impending fracture. He was also advised to proceed with treatment for his multiple myeloma to potentially avoid the long-term multi organ complications of multiple myeloma. He would like to give this some additional thought. He will proceed with the orthopedic procedure to stabilize his left forearm and will return in 2-3 weeks to discuss his decision as to proceeding with  treatment for his multiple myeloma.  He was advised to call if he has any concerning symptoms in the interval.  The patient voices understanding of current disease status and treatment options and is in agreement with the current care plan.  All questions were answered. The patient knows to call the clinic with any problems, questions or concerns. We can certainly see the patient much sooner if necessary.  Carlton Adam, PA-C 10/16/2014  ADDENDUM: Hematology/Oncology Attending: I had a face to face encounter with the patient. I recommended his care plan. This is a very pleasant 69 years old African-American male who was recently diagnosed with multiple myeloma based on the recent imaging and laboratory findings. The patient also has large lytic lesion affecting the left humerus impending fracture and he is currently evaluated by orthopedic surgery. He had a recent bone marrow biopsy and aspirate  as well as blood work consistent with multiple myeloma. I had a lengthy discussion with the patient and his wife today about his current condition and treatment options. I discussed with the patient treatment with systemic chemotherapy with Revlimid, Velcade and Decadron as well as palliative radiation to the left humerus after surgical intervention. The patient would like some time to think about his option. We will arrange for him a follow-up appointment in 2-3 weeks for reevaluation and discussion of his treatment options after the surgical intervention for his left humerus. He was advised to call if he has any concerning symptoms in the interval.  Disclaimer: This note was dictated with voice recognition software. Similar sounding words can inadvertently be transcribed and may be missed upon review. Eilleen Kempf., MD 10/20/2014

## 2014-10-16 NOTE — Telephone Encounter (Signed)
Gave and printed appt sched and avs for pt for July  °

## 2014-10-17 ENCOUNTER — Other Ambulatory Visit: Payer: Self-pay | Admitting: Orthopedic Surgery

## 2014-10-17 ENCOUNTER — Telehealth: Payer: Self-pay | Admitting: Internal Medicine

## 2014-10-17 NOTE — Telephone Encounter (Signed)
lvm for pt regarding to JULY appt...mailed pt appt sched/avs and letter

## 2014-10-17 NOTE — H&P (Signed)
Wesley [name] is an 69 y.o. male.   Chief Complaint: left forearm pain HPI: Wesley [name] is a pleasant 69 year old male who has a history of bladder cancer diagnosed in 2015.  He recently was found to have a lesion in his right rib which was biopsied.  He comes in complaining of a one-week history of pain in his left forearm.  He went to the hospital where x-rays showed a nondisplaced pathologic fracture of his left radial shaft with what appeared to be a lytic aggressive lesion.  He was placed in a splint.  He removed in a couple of days ago but continues to have discomfort when he uses his left nondominant arm.  Pt's hem-onc physician believes this to likely be multiple myeloma lesion  Past Medical History  Diagnosis Date  . Pulmonary nodule, right     W/  CHEST WALL MASS--  SCHEDULED FOR BIOPSY W/ DR [name] ON FRIDAY 10-27-2013  . History of seizures as a child     AGE 58 OR 8--- NONE ISSUE SINCE  . History of GI bleed     SECONDARY TO ULCER --  DEC 2011  RESOLVED PER LAST EGD 07-05-2010  . Bladder tumor   . Hematuria   . Radiation 12/07/13-12/27/13    right anterior chest 37.5 gray  . Fracture of radius 09/29/14    left    Past Surgical History  Procedure Laterality Date  . Excision benign left axilla tumor  1993  . Esophagogastroduodenoscopy  X5   LAST ONE 07-05-2010  . Transurethral resection of bladder tumor with gyrus (turbt-gyrus) N/A 10/25/2013    Procedure: TRANSURETHRAL RESECTION OF BLADDER TUMOR WITH GYRUS (TURBT-GYRUS);  Surgeon: Sharyn Creamer, MD;  Location: [facility];  Service: Urology;  Laterality: N/A;  . Cystoscopy with biopsy N/A 10/25/2013    Procedure: CYSTOSCOPY;  Surgeon: Sharyn Creamer, MD;  Location: [facility];  Service: Urology;  Laterality: N/A;  . Bone biopsy Right 11/14/13     right seventh rib lesion    Family History  Problem Relation Age of Onset  . Heart disease Mother   . Cancer - Prostate Brother    Social History:   reports that he quit smoking about 45 years ago. His smoking use included Cigarettes. He has a .1 pack-year smoking history. He has never used smokeless tobacco. He reports that he does not drink alcohol or use illicit drugs.  Allergies: No Known Allergies  No prescriptions prior to admission    No results found for this or any previous visit (from the past 48 hour(s)). No results found.  Review of Systems  All other systems reviewed and are negative.   There were no vitals taken for this visit. Physical Exam  Constitutional: He is oriented to person, place, and time. He appears well-developed and well-nourished.  HENT:  Head: Normocephalic and atraumatic.  Neck: Normal range of motion.  Cardiovascular: Intact distal pulses.   Respiratory: Effort normal.  GI: Soft.  Musculoskeletal:   He is tender over the midshaft of the radius.  Minimal swelling.  Neurovascular status is intact distally.  Neurological: He is alert and oriented to person, place, and time.  Skin: Skin is warm and dry.  Psychiatric: He has a normal mood and affect. His behavior is normal. Judgment and thought content normal.    Imaging/Tests:Radiographs:   AP & lateral views of left forearm shows a 4.5 cm lytic lesion in the midshaft of his left radius.  There is a fracture of the remaining cortex.  Assessment/Plan Left radius diaphyseal pathologic fracture  To OR for IMN, followed by continued oncological care.  G/R/O reviewed and consent obtained.  Kikue Gerhart A. 10/17/2014, 5:08 PM

## 2014-10-19 ENCOUNTER — Encounter (HOSPITAL_BASED_OUTPATIENT_CLINIC_OR_DEPARTMENT_OTHER): Payer: Self-pay | Admitting: *Deleted

## 2014-10-19 NOTE — Patient Instructions (Signed)
Your bone marrow biopsy, myeloma panel, and bone survey are all positive for multiple myeloma. We recommend that you start treatment for the multiple myeloma Proceed with stabilization of your left forearm as planned by your orthopedist Follow-up in one month to discuss your decision regarding starting treatment for your multiple myeloma

## 2014-10-22 ENCOUNTER — Ambulatory Visit (HOSPITAL_BASED_OUTPATIENT_CLINIC_OR_DEPARTMENT_OTHER)
Admission: RE | Admit: 2014-10-22 | Discharge: 2014-10-22 | Disposition: A | Discharge status: Home / Self Care | Payer: Medicare HMO | Source: Ambulatory Visit | Attending: Orthopedic Surgery | Admitting: Orthopedic Surgery

## 2014-10-22 ENCOUNTER — Encounter (HOSPITAL_BASED_OUTPATIENT_CLINIC_OR_DEPARTMENT_OTHER): Payer: Self-pay

## 2014-10-22 ENCOUNTER — Ambulatory Visit (HOSPITAL_BASED_OUTPATIENT_CLINIC_OR_DEPARTMENT_OTHER): Payer: Medicare HMO | Admitting: Anesthesiology

## 2014-10-22 ENCOUNTER — Encounter (HOSPITAL_BASED_OUTPATIENT_CLINIC_OR_DEPARTMENT_OTHER)
Admission: RE | Disposition: A | Discharge status: Home / Self Care | Payer: Self-pay | Source: Ambulatory Visit | Attending: Orthopedic Surgery

## 2014-10-22 ENCOUNTER — Ambulatory Visit (HOSPITAL_COMMUNITY): Payer: Medicare HMO

## 2014-10-22 DIAGNOSIS — Z87891 Personal history of nicotine dependence: Secondary | ICD-10-CM | POA: Insufficient documentation

## 2014-10-22 DIAGNOSIS — Z01818 Encounter for other preprocedural examination: Secondary | ICD-10-CM | POA: Diagnosis not present

## 2014-10-22 DIAGNOSIS — Z8551 Personal history of malignant neoplasm of bladder: Secondary | ICD-10-CM | POA: Diagnosis not present

## 2014-10-22 DIAGNOSIS — S5290XA Unspecified fracture of unspecified forearm, initial encounter for closed fracture: Secondary | ICD-10-CM

## 2014-10-22 DIAGNOSIS — M84434A Pathological fracture, left radius, initial encounter for fracture: Secondary | ICD-10-CM | POA: Insufficient documentation

## 2014-10-22 HISTORY — PX: ORIF RADIAL FRACTURE: SHX5113

## 2014-10-22 HISTORY — DX: Malignant (primary) neoplasm, unspecified: C80.1

## 2014-10-22 LAB — POCT HEMOGLOBIN-HEMACUE: Hemoglobin: 10.1 g/dL — ABNORMAL LOW (ref 13.0–17.0)

## 2014-10-22 SURGERY — OPEN REDUCTION INTERNAL FIXATION (ORIF) RADIAL FRACTURE
Anesthesia: General | Site: Arm Lower | Laterality: Left

## 2014-10-22 MED ORDER — OXYCODONE HCL 5 MG PO TABS: 5.0000 mg | ORAL_TABLET | Freq: Once | ORAL | Status: DC | PRN

## 2014-10-22 MED ORDER — ONDANSETRON HCL 4 MG/2ML IJ SOLN
INTRAMUSCULAR | Status: DC | PRN
  Administered 2014-10-22: 4 mg via INTRAVENOUS

## 2014-10-22 MED ORDER — BUPIVACAINE-EPINEPHRINE (PF) 0.5% -1:200000 IJ SOLN
INTRAMUSCULAR | Status: AC
  Filled 2014-10-22: qty 30

## 2014-10-22 MED ORDER — MEPERIDINE HCL 25 MG/ML IJ SOLN: 6.2500 mg | INTRAMUSCULAR | Status: DC | PRN

## 2014-10-22 MED ORDER — FENTANYL CITRATE (PF) 100 MCG/2ML IJ SOLN
INTRAMUSCULAR | Status: AC
  Filled 2014-10-22: qty 6

## 2014-10-22 MED ORDER — MIDAZOLAM HCL 2 MG/2ML IJ SOLN
INTRAMUSCULAR | Status: AC
Start: 2014-10-22 — End: 2014-10-22
  Filled 2014-10-22: qty 2

## 2014-10-22 MED ORDER — GLYCOPYRROLATE 0.2 MG/ML IJ SOLN: 0.2000 mg | Freq: Once | INTRAMUSCULAR | Status: DC | PRN

## 2014-10-22 MED ORDER — OXYCODONE-ACETAMINOPHEN 5-325 MG PO TABS: 1.0000 | ORAL_TABLET | Freq: Four times a day (QID) | ORAL | Status: DC | PRN

## 2014-10-22 MED ORDER — HYDROMORPHONE HCL 1 MG/ML IJ SOLN
INTRAMUSCULAR | Status: AC
  Filled 2014-10-22: qty 1

## 2014-10-22 MED ORDER — DEXAMETHASONE SODIUM PHOSPHATE 4 MG/ML IJ SOLN
INTRAMUSCULAR | Status: DC | PRN
Start: 2014-10-22 — End: 2014-10-22
  Administered 2014-10-22: 10 mg via INTRAVENOUS

## 2014-10-22 MED ORDER — LACTATED RINGERS IV SOLN: INTRAVENOUS | Status: DC

## 2014-10-22 MED ORDER — SCOPOLAMINE 1 MG/3DAYS TD PT72: 1.0000 | MEDICATED_PATCH | Freq: Once | TRANSDERMAL | Status: DC | PRN

## 2014-10-22 MED ORDER — CEFAZOLIN SODIUM-DEXTROSE 2-3 GM-% IV SOLR
INTRAVENOUS | Status: AC
  Filled 2014-10-22: qty 50

## 2014-10-22 MED ORDER — LIDOCAINE HCL (CARDIAC) 20 MG/ML IV SOLN
INTRAVENOUS | Status: DC | PRN
Start: 2014-10-22 — End: 2014-10-22
  Administered 2014-10-22: 80 mg via INTRAVENOUS

## 2014-10-22 MED ORDER — OXYCODONE HCL 5 MG/5ML PO SOLN: 5.0000 mg | Freq: Once | ORAL | Status: DC | PRN

## 2014-10-22 MED ORDER — HYDROMORPHONE HCL 1 MG/ML IJ SOLN
0.2500 mg | INTRAMUSCULAR | Status: DC | PRN
  Administered 2014-10-22 (×3): 0.5 mg via INTRAVENOUS

## 2014-10-22 MED ORDER — MIDAZOLAM HCL 2 MG/2ML IJ SOLN
1.0000 mg | INTRAMUSCULAR | Status: DC | PRN
  Administered 2014-10-22: 2 mg via INTRAVENOUS

## 2014-10-22 MED ORDER — CEFAZOLIN SODIUM-DEXTROSE 2-3 GM-% IV SOLR
2.0000 g | INTRAVENOUS | Status: AC
  Administered 2014-10-22: 2 g via INTRAVENOUS

## 2014-10-22 MED ORDER — BUPIVACAINE-EPINEPHRINE 0.5% -1:200000 IJ SOLN
INTRAMUSCULAR | Status: DC | PRN
  Administered 2014-10-22: 10 mL

## 2014-10-22 MED ORDER — PROPOFOL 500 MG/50ML IV EMUL
INTRAVENOUS | Status: AC
  Filled 2014-10-22: qty 50

## 2014-10-22 MED ORDER — LACTATED RINGERS IV SOLN
INTRAVENOUS | Status: DC
  Administered 2014-10-22: 08:00:00 via INTRAVENOUS

## 2014-10-22 MED ORDER — FENTANYL CITRATE (PF) 100 MCG/2ML IJ SOLN
50.0000 ug | INTRAMUSCULAR | Status: AC | PRN
  Administered 2014-10-22: 25 ug via INTRAVENOUS
  Administered 2014-10-22: 100 ug via INTRAVENOUS
  Administered 2014-10-22: 25 ug via INTRAVENOUS

## 2014-10-22 MED ORDER — PROPOFOL 10 MG/ML IV BOLUS
INTRAVENOUS | Status: DC | PRN
  Administered 2014-10-22: 250 mg via INTRAVENOUS

## 2014-10-22 SURGICAL SUPPLY — 64 items
3.1 T-HANDLE REAMER ×2 IMPLANT
3.7 THANDLE REAMER ×2 IMPLANT
BLADE MINI RND TIP GREEN BEAV (BLADE) IMPLANT
BLADE SURG 15 STRL LF DISP TIS (BLADE) ×1 IMPLANT
BLADE SURG 15 STRL SS (BLADE) ×6
BNDG CMPR 9X4 STRL LF SNTH (GAUZE/BANDAGES/DRESSINGS) ×1
BNDG COHESIVE 4X5 TAN STRL (GAUZE/BANDAGES/DRESSINGS) ×3 IMPLANT
BNDG COHESIVE 6X5 TAN STRL LF (GAUZE/BANDAGES/DRESSINGS) IMPLANT
BNDG ESMARK 4X9 LF (GAUZE/BANDAGES/DRESSINGS) ×3 IMPLANT
BNDG GAUZE ELAST 4 BULKY (GAUZE/BANDAGES/DRESSINGS) ×6 IMPLANT
BRUSH SCRUB EZ PLAIN DRY (MISCELLANEOUS) IMPLANT
CANISTER SUCT 1200ML W/VALVE (MISCELLANEOUS) IMPLANT
CHLORAPREP W/TINT 26ML (MISCELLANEOUS) ×3 IMPLANT
CORDS BIPOLAR (ELECTRODE) ×3 IMPLANT
COVER BACK TABLE 60X90IN (DRAPES) ×3 IMPLANT
COVER MAYO STAND STRL (DRAPES) ×3 IMPLANT
CUFF TOURNIQUET SINGLE 18IN (TOURNIQUET CUFF) ×2 IMPLANT
CUFF TOURNIQUET SINGLE 24IN (TOURNIQUET CUFF) IMPLANT
DRAPE C-ARM 42X72 X-RAY (DRAPES) ×3 IMPLANT
DRAPE EXTREMITY T 121X128X90 (DRAPE) ×3 IMPLANT
DRAPE SURG 17X23 STRL (DRAPES) ×2 IMPLANT
DRSG ADAPTIC 3X8 NADH LF (GAUZE/BANDAGES/DRESSINGS) ×2 IMPLANT
DRSG EMULSION OIL 3X3 NADH (GAUZE/BANDAGES/DRESSINGS) ×3 IMPLANT
ELECT REM PT RETURN 9FT ADLT (ELECTROSURGICAL)
ELECTRODE REM PT RTRN 9FT ADLT (ELECTROSURGICAL) ×1 IMPLANT
GLOVE BIO SURGEON STRL SZ7.5 (GLOVE) ×3 IMPLANT
GLOVE BIOGEL PI IND STRL 7.0 (GLOVE) ×1 IMPLANT
GLOVE BIOGEL PI IND STRL 8 (GLOVE) ×1 IMPLANT
GLOVE BIOGEL PI INDICATOR 7.0 (GLOVE) ×4
GLOVE BIOGEL PI INDICATOR 8 (GLOVE) ×2
GLOVE ECLIPSE 6.5 STRL STRAW (GLOVE) ×5 IMPLANT
GOWN STRL REUS W/ TWL XL LVL3 (GOWN DISPOSABLE) IMPLANT
GOWN STRL REUS W/TWL XL LVL3 (GOWN DISPOSABLE) ×9 IMPLANT
NDL HYPO 25X1 1.5 SAFETY (NEEDLE) IMPLANT
NEEDLE HYPO 25X1 1.5 SAFETY (NEEDLE) ×3 IMPLANT
NS IRRIG 1000ML POUR BTL (IV SOLUTION) ×3 IMPLANT
PACK BASIN DAY SURGERY FS (CUSTOM PROCEDURE TRAY) ×3 IMPLANT
PADDING CAST ABS 4INX4YD NS (CAST SUPPLIES) ×2
PADDING CAST ABS COTTON 4X4 ST (CAST SUPPLIES) IMPLANT
PENCIL BUTTON HOLSTER BLD 10FT (ELECTRODE) ×1 IMPLANT
SLEEVE SCD COMPRESS KNEE MED (MISCELLANEOUS) ×3 IMPLANT
SLING ARM FOAM STRAP LRG (SOFTGOODS) ×2 IMPLANT
SPONGE GAUZE 4X4 12PLY STER LF (GAUZE/BANDAGES/DRESSINGS) ×3 IMPLANT
SPONGE LAP 18X18 X RAY DECT (DISPOSABLE) IMPLANT
STOCKINETTE 6  STRL (DRAPES) ×2
STOCKINETTE 6 STRL (DRAPES) ×1 IMPLANT
SUCTION FRAZIER TIP 10 FR DISP (SUCTIONS) IMPLANT
SUT VIC AB 2-0 PS2 27 (SUTURE) IMPLANT
SUT VIC AB 3-0 PS1 18 (SUTURE) ×3
SUT VIC AB 3-0 PS1 18XBRD (SUTURE) IMPLANT
SUT VICRYL 4-0 PS2 18IN ABS (SUTURE) ×1 IMPLANT
SUT VICRYL RAPIDE 4-0 (SUTURE) IMPLANT
SUT VICRYL RAPIDE 4/0 PS 2 (SUTURE) ×3 IMPLANT
SYR BULB 3OZ (MISCELLANEOUS) ×3 IMPLANT
SYRINGE 10CC LL (SYRINGE) ×2 IMPLANT
TAP DRILL 2.8 MM ×2 IMPLANT
TOWEL OR 17X24 6PK STRL BLUE (TOWEL DISPOSABLE) ×6 IMPLANT
TOWEL OR NON WOVEN STRL DISP B (DISPOSABLE) ×1 IMPLANT
TUBE CONNECTING 20'X1/4 (TUBING)
TUBE CONNECTING 20X1/4 (TUBING) IMPLANT
UNDERPAD 30X30 (UNDERPADS AND DIAPERS) ×3 IMPLANT
YANKAUER SUCT BULB TIP NO VENT (SUCTIONS) IMPLANT
cortical screw ×1 IMPLANT
radius rod left ×2 IMPLANT

## 2014-10-22 NOTE — Anesthesia Procedure Notes (Signed)
Procedure Name: LMA Insertion Date/Time: 10/22/2014 7:42 AM Performed by: Lyndee Leo Pre-anesthesia Checklist: Patient identified, Emergency Drugs available, Suction available and Patient being monitored Patient Re-evaluated:Patient Re-evaluated prior to inductionOxygen Delivery Method: Circle System Utilized Preoxygenation: Pre-oxygenation with 100% oxygen Intubation Type: IV induction Ventilation: Mask ventilation without difficulty LMA: LMA inserted LMA Size: 5.0 Number of attempts: 1 Airway Equipment and Method: Bite block Placement Confirmation: positive ETCO2 Tube secured with: Tape Dental Injury: Teeth and Oropharynx as per pre-operative assessment

## 2014-10-22 NOTE — Interval H&P Note (Signed)
History and Physical Interval Note:  10/22/2014 7:30 AM  Wesley [name]  has presented today for surgery, with the diagnosis of LEFT FOREARM IMPENDING PATHOLOGIC FRACTURE  The various methods of treatment have been discussed with the patient and family. After consideration of risks, benefits and other options for treatment, the patient has consented to  Procedure(s) with comments: OPEN REDUCTION INTERNAL FIXATION (ORIF) RADIAL FRACTURE (Left) - OPEN TREATMENT LEFT RADIUS FRACTURE as a surgical intervention .  The patient's history has been reviewed, patient examined, no change in status, stable for surgery.  I have reviewed the patient's chart and labs.  Questions were answered to the patient's satisfaction.     Daquawn Seelman A.

## 2014-10-22 NOTE — Anesthesia Preprocedure Evaluation (Addendum)
Anesthesia Evaluation  Patient identified by MRN, date of birth, ID band Patient awake    Reviewed: Allergy & Precautions, NPO status , Patient's Chart, lab work & pertinent test results  Airway Mallampati: I  TM Distance: >3 FB Neck ROM: Full    Dental  (+) Partial Lower, Upper Dentures, Dental Advisory Given   Pulmonary former smoker,  breath sounds clear to auscultation        Cardiovascular Rhythm:Regular Rate:Normal     Neuro/Psych    GI/Hepatic   Endo/Other    Renal/GU      Musculoskeletal   Abdominal   Peds  Hematology   Anesthesia Other Findings   Reproductive/Obstetrics                            Anesthesia Physical Anesthesia Plan  ASA: I  Anesthesia Plan: General   Post-op Pain Management:    Induction: Intravenous  Airway Management Planned: LMA  Additional Equipment:   Intra-op Plan:   Post-operative Plan: Extubation in OR  Informed Consent: I have reviewed the patients History and Physical, chart, labs and discussed the procedure including the risks, benefits and alternatives for the proposed anesthesia with the patient or authorized representative who has indicated his/her understanding and acceptance.   Dental advisory given  Plan Discussed with: CRNA, Anesthesiologist and Surgeon  Anesthesia Plan Comments: (Surgeon requested no block due to possible development of compartment syndrome.)       Anesthesia Quick Evaluation

## 2014-10-22 NOTE — Op Note (Signed)
10/22/2014  7:30 AM  PATIENT:  Wesley [name]  69 y.o. male  PRE-OPERATIVE DIAGNOSIS:  Pathologic left radius fracture  POST-OPERATIVE DIAGNOSIS:  Same  PROCEDURE:  ORIF left radius fracture with intramedullary nail  SURGEON: Rayvon Char. Grandville Silos, MD  PHYSICIAN ASSISTANT: Morley Kos, OPA-C  ANESTHESIA:  general  SPECIMENS:  None  DRAINS:   None  EBL:  less than 50 mL  PREOPERATIVE INDICATIONS:  Wesley [name] is a  69 y.o. male with pathologic left midshaft radius fracture  The risks benefits and alternatives were discussed with the patient preoperatively including but not limited to the risks of infection, bleeding, nerve injury, cardiopulmonary complications, the need for revision surgery, among others, and the patient verbalized understanding and consented to proceed.  OPERATIVE IMPLANTS: Acumed 3.6 mm intramedullary radius nail and 17 length screw  OPERATIVE PROCEDURE:  After receiving prophylactic antibiotics, the patient was escorted to the operative theatre and placed in a supine position. General anesthesia was administered A surgical "time-out" was performed during which the planned procedure, proposed operative site, and the correct patient identity were compared to the operative consent and agreement confirmed by the circulating nurse according to current facility policy.  Following application of a tourniquet to the operative extremity, the exposed skin was prepped with Chloraprep and draped in the usual sterile fashion.  The limb was exsanguinated with an Esmarch bandage and the tourniquet inflated to approximately 184mmHg higher than systolic BP.  Using the hand reamers, the anticipated nail diameter in length was established under fluoroscopy. A 3-4 cm length incision was made over the dorsal aspect of the distal radius. Skin was incised sharply with scalpel subcutaneous taste tissues were dissected with blunt and spreading dissection. The fourth compartment was opened  and elevated ulnarly exposing the distal radius. The site for entry with the hand reamer was established fluoroscopically and entry was made. Some of the dorsal cortex split out in this process and was later morselized with a Ron [place] laid back into place. Nonetheless, following the initial cannulation with the entry reamer, the more slender hand reamer was advanced down the radius to its proximal and, ensuring passed through the canal at the site of the tumor. It was removed, followed by placement of the larger reamer. Once this was removed the proper nail was selected, which was a 3.6 mm x 210 mm nail and it was advanced by hand into its proper ending position. The incision was opened slightly more proximally and the interlocking screw placed. It was a 17 length. The split out shell of distal radius was morselized and placed into the bed. The EPL was transposed. Final images were obtained. The wound was irrigated and the fourth compartment was laid into place and the retinaculum secured at the level of Lister's tubercle area tourniquet was released some additional hemostasis obtained with bipolar cautery and the skin was closed with 30 Vicryls deep dermal buried sutures followed by running 40 vital Rapide horizontal mattress suture in the skin. After some Marcaine with epinephrine was infiltrated around the skin incision as well as deep to it along the dorsal aspect of the distal radius and then a sugar tong splint dressing was applied with the forearm in neutral. He was awakened and taken to the recovery room stable condition, breathing spontaneously.  DISPOSITION: He'll be discharged home with typical instructions, returning in 2-3 weeks. At that time he should have new x-rays of the left forearm out of the splint.

## 2014-10-22 NOTE — Transfer of Care (Signed)
Immediate Anesthesia Transfer of Care Note  Patient: Wesley [name]  Procedure(s) Performed: Procedure(s) with comments: Intramedullary nailing left radius fracture (Left) - OPEN TREATMENT LEFT RADIUS FRACTURE  Patient Location: PACU  Anesthesia Type:General  Level of Consciousness: awake, sedated and lethargic  Airway & Oxygen Therapy: Patient Spontanous Breathing and Patient connected to face mask oxygen  Post-op Assessment: Report given to RN and Post -op Vital signs reviewed and stable  Post vital signs: Reviewed and stable  Last Vitals:  Filed Vitals:   10/22/14 0639  BP: 150/88  Pulse: 65  Temp: 36.6 C  Resp: 20    Complications: No apparent anesthesia complications

## 2014-10-22 NOTE — Discharge Instructions (Addendum)
Discharge Instructions   You have a dressing with a plaster splint incorporated in it. Move your fingers as much as possible, making a full fist and fully opening the fist. Elevate your hand to reduce pain & swelling of the digits.  Ice over the operative site may be helpful to reduce pain & swelling.  DO NOT USE HEAT. Pain medicine has been prescribed for you.  Use your medicine as needed over the first 48 hours, and then you can begin to taper your use.  You may use Tylenol in place of your prescribed pain medication, but not IN ADDITION to it. Leave the dressing in place until you return to our office.  You may shower, but keep the bandage clean & dry.  You may drive a car when you are off of prescription pain medications and can safely control your vehicle with both hands.   Please call [phone] during normal business hours or [phone] after hours for any problems. Including the following:  - excessive redness of the incisions - drainage for more than 4 days - fever of more than 101.5 F  *Please note that pain medications will not be refilled after hours or on weekends.   Post Anesthesia Home Care Instructions  Activity: Get plenty of rest for the remainder of the day. A responsible adult should stay with you for 24 hours following the procedure.  For the next 24 hours, DO NOT: -Drive a car -Paediatric nurse -Drink alcoholic beverages -Take any medication unless instructed by your physician -Make any legal decisions or sign important papers.  Meals: Start with liquid foods such as gelatin or soup. Progress to regular foods as tolerated. Avoid greasy, spicy, heavy foods. If nausea and/or vomiting occur, drink only clear liquids until the nausea and/or vomiting subsides. Call your physician if vomiting continues.  Special Instructions/Symptoms: Your throat may feel dry or sore from the anesthesia or the breathing tube placed in your throat during surgery. If this  causes discomfort, gargle with warm salt water. The discomfort should disappear within 24 hours.  If you had a scopolamine patch placed behind your ear for the management of post- operative nausea and/or vomiting:  1. The medication in the patch is effective for 72 hours, after which it should be removed.  Wrap patch in a tissue and discard in the trash. Wash hands thoroughly with soap and water. 2. You may remove the patch earlier than 72 hours if you experience unpleasant side effects which may include dry mouth, dizziness or visual disturbances. 3. Avoid touching the patch. Wash your hands with soap and water after contact with the patch.

## 2014-10-22 NOTE — Anesthesia Postprocedure Evaluation (Signed)
  Anesthesia Post-op Note  Patient: Wesley [name]  Procedure(s) Performed: Procedure(s) with comments: Intramedullary nailing left radius fracture (Left) - OPEN TREATMENT LEFT RADIUS FRACTURE  Patient Location: PACU  Anesthesia Type: General   Level of Consciousness: awake, alert  and oriented  Airway and Oxygen Therapy: Patient Spontanous Breathing  Post-op Pain: mild  Post-op Assessment: Post-op Vital signs reviewed  Post-op Vital Signs: Reviewed  Last Vitals:  Filed Vitals:   10/22/14 0909  BP:   Pulse: 65  Temp:   Resp: 12    Complications: No apparent anesthesia complications

## 2014-10-24 ENCOUNTER — Encounter (HOSPITAL_BASED_OUTPATIENT_CLINIC_OR_DEPARTMENT_OTHER): Payer: Self-pay | Admitting: Orthopedic Surgery

## 2014-10-26 ENCOUNTER — Encounter (HOSPITAL_COMMUNITY): Payer: Self-pay

## 2014-11-13 ENCOUNTER — Ambulatory Visit (HOSPITAL_BASED_OUTPATIENT_CLINIC_OR_DEPARTMENT_OTHER): Payer: Medicare HMO | Admitting: Internal Medicine

## 2014-11-13 ENCOUNTER — Encounter: Payer: Self-pay | Admitting: Internal Medicine

## 2014-11-13 ENCOUNTER — Other Ambulatory Visit (HOSPITAL_BASED_OUTPATIENT_CLINIC_OR_DEPARTMENT_OTHER): Payer: Medicare HMO

## 2014-11-13 ENCOUNTER — Telehealth: Payer: Self-pay | Admitting: Internal Medicine

## 2014-11-13 ENCOUNTER — Telehealth: Payer: Self-pay | Admitting: *Deleted

## 2014-11-13 VITALS — BP 152/89 | HR 73 | Temp 98.4°F | Resp 18 | Ht 68.0 in | Wt 203.2 lb

## 2014-11-13 DIAGNOSIS — C9000 Multiple myeloma not having achieved remission: Secondary | ICD-10-CM

## 2014-11-13 LAB — COMPREHENSIVE METABOLIC PANEL (CC13)
ALBUMIN: 3.7 g/dL (ref 3.5–5.0)
ALT: 10 U/L (ref 0–55)
ANION GAP: 6 meq/L (ref 3–11)
AST: 13 U/L (ref 5–34)
Alkaline Phosphatase: 68 U/L (ref 40–150)
BUN: 12 mg/dL (ref 7.0–26.0)
CALCIUM: 9.5 mg/dL (ref 8.4–10.4)
CHLORIDE: 104 meq/L (ref 98–109)
CO2: 27 mEq/L (ref 22–29)
CREATININE: 1.4 mg/dL — AB (ref 0.7–1.3)
EGFR: 60 mL/min/{1.73_m2} — ABNORMAL LOW (ref >90)
GLUCOSE: 115 mg/dL (ref 70–140)
Potassium: 3.7 mEq/L (ref 3.5–5.1)
Sodium: 137 mEq/L (ref 136–145)
TOTAL PROTEIN: 9 g/dL — AB (ref 6.4–8.3)
Total Bilirubin: 0.97 mg/dL (ref 0.20–1.20)

## 2014-11-13 LAB — CBC WITH DIFFERENTIAL/PLATELET
BASO%: 0.7 % (ref 0.0–2.0)
BASOS ABS: 0 10*3/uL (ref 0.0–0.1)
EOS%: 3.2 % (ref 0.0–7.0)
Eosinophils Absolute: 0.1 10*3/uL (ref 0.0–0.5)
HEMATOCRIT: 29 % — AB (ref 38.4–49.9)
HGB: 9.8 g/dL — ABNORMAL LOW (ref 13.0–17.1)
LYMPH%: 20.5 % (ref 14.0–49.0)
MCH: 31.4 pg (ref 27.2–33.4)
MCHC: 33.7 g/dL (ref 32.0–36.0)
MCV: 93.3 fL (ref 79.3–98.0)
MONO#: 0.5 10*3/uL (ref 0.1–0.9)
MONO%: 16.3 % — ABNORMAL HIGH (ref 0.0–14.0)
NEUT#: 1.8 10*3/uL (ref 1.5–6.5)
NEUT%: 59.3 % (ref 39.0–75.0)
Platelets: 189 10*3/uL (ref 140–400)
RBC: 3.11 10*6/uL — ABNORMAL LOW (ref 4.20–5.82)
RDW: 17.1 % — ABNORMAL HIGH (ref 11.0–14.6)
WBC: 3.1 10*3/uL — AB (ref 4.0–10.3)
lymph#: 0.6 10*3/uL — ABNORMAL LOW (ref 0.9–3.3)

## 2014-11-13 LAB — LACTATE DEHYDROGENASE (CC13): LDH: 261 U/L — ABNORMAL HIGH (ref 125–245)

## 2014-11-13 MED ORDER — ACYCLOVIR 400 MG PO TABS: 400.0000 mg | ORAL_TABLET | Freq: Two times a day (BID) | ORAL | Status: DC

## 2014-11-13 MED ORDER — LENALIDOMIDE 25 MG PO CAPS: 25.0000 mg | ORAL_CAPSULE | Freq: Every day | ORAL | Status: DC

## 2014-11-13 MED ORDER — PROCHLORPERAZINE MALEATE 10 MG PO TABS: 10.0000 mg | ORAL_TABLET | Freq: Four times a day (QID) | ORAL | Status: DC | PRN

## 2014-11-13 MED ORDER — DEXAMETHASONE 4 MG PO TABS: ORAL_TABLET | ORAL | Status: DC

## 2014-11-13 NOTE — Progress Notes (Signed)
[facility] Telephone:(336) [phone]   Fax:(336) [phone]  OFFICE PROGRESS NOTE  [name], MD [place] [address]  DIAGNOSIS:  Multiple myeloma diagnosed in May 2016  PRIOR THERAPY: Curative radiotherapy to the tumor mass at the right anterior chest under the care of Dr. [name] completed in 12/27/2013.  CURRENT THERAPY: Systemic chemotherapy with weekly subcutaneous Velcade 1.3 MG/M2, Revlimid 25 mg by mouth daily for 21 days every 4 weeks in addition to weekly Decadron 40 mg orally.   INTERVAL HISTORY: Wesley [name] 69 y.o. male returns to the clinic today for follow-up visit accompanied by his wife. The patient was diagnosed with multiple myeloma several weeks ago. I discussed with him at that time several options for treatment of his condition but he was unable to make a decision. He had a pathologic fracture of the left radius and underwent ORIF left radius fracture with intramedullary nail under the care of Dr. Grandville Silos on 10/22/2014. He denied having any significant chest pain, shortness of breath, cough or hemoptysis. The patient denied having any weight loss or night sweats. He has no nausea or vomiting. He is here today for evaluation and discussion of his treatment options.  MEDICAL HISTORY: Past Medical History  Diagnosis Date  . Pulmonary nodule, right     W/  CHEST WALL MASS--  SCHEDULED FOR BIOPSY W/ DR [name] ON FRIDAY 10-27-2013  . History of seizures as a child     AGE 16 OR 8--- NONE ISSUE SINCE  . History of GI bleed     SECONDARY TO ULCER --  DEC 2011  RESOLVED PER LAST EGD 07-05-2010  . Bladder tumor   . Hematuria   . Radiation 12/07/13-12/27/13    right anterior chest 37.5 gray  . Fracture of radius 09/29/14    left  . Cancer     multiple myeloma, bladder cancer 2015    ALLERGIES:  has No Known Allergies.  MEDICATIONS:  Current Outpatient Prescriptions  Medication Sig Dispense Refill  .  Aspirin-Acetaminophen-Caffeine 500-325-65 MG PACK Take 1 packet by mouth every 4 (four) hours as needed (for pain).    . hyaluronate sodium (RADIAPLEXRX) GEL Apply 1 application topically 2 (two) times daily.    Marland Kitchen HYDROcodone-acetaminophen (NORCO/VICODIN) 5-325 MG per tablet Take 1-2 tablets by mouth every 6 (six) hours as needed for moderate pain. (Patient not taking: Reported on 11/13/2014) 30 tablet 0   No current facility-administered medications for this visit.    SURGICAL HISTORY:  Past Surgical History  Procedure Laterality Date  . Excision benign left axilla tumor  1993  . Esophagogastroduodenoscopy  X5   LAST ONE 07-05-2010  . Transurethral resection of bladder tumor with gyrus (turbt-gyrus) N/A 10/25/2013    Procedure: TRANSURETHRAL RESECTION OF BLADDER TUMOR WITH GYRUS (TURBT-GYRUS);  Surgeon: Sharyn Creamer, MD;  Location: [facility];  Service: Urology;  Laterality: N/A;  . Cystoscopy with biopsy N/A 10/25/2013    Procedure: CYSTOSCOPY;  Surgeon: Sharyn Creamer, MD;  Location: [facility];  Service: Urology;  Laterality: N/A;  . Bone biopsy Right 11/14/13     right seventh rib lesion  . Orif radial fracture Left 10/22/2014    Procedure: Intramedullary nailing left radius fracture;  Surgeon: Milly Jakob, MD;  Location: [facility];  Service: Orthopedics;  Laterality: Left;  OPEN TREATMENT LEFT RADIUS FRACTURE    REVIEW OF SYSTEMS:  Constitutional: positive for fatigue Eyes: negative Ears, nose,  mouth, throat, and face: negative Respiratory: negative Cardiovascular: negative Gastrointestinal: negative Genitourinary:negative Integument/breast: negative Hematologic/lymphatic: negative Musculoskeletal:positive for bone pain Neurological: negative Behavioral/Psych: negative Endocrine: negative Allergic/Immunologic: negative   PHYSICAL EXAMINATION: General appearance: alert, cooperative and no distress Head: Normocephalic,  without obvious abnormality, atraumatic Neck: no adenopathy, no carotid bruit, supple, symmetrical, trachea midline and thyroid not enlarged, symmetric, no tenderness/mass/nodules Lymph nodes: Cervical, supraclavicular, and axillary nodes normal. Resp: clear to auscultation bilaterally Back: symmetric, no curvature. ROM normal. No CVA tenderness. Cardio: regular rate and rhythm, S1, S2 normal, no murmur, click, rub or gallop GI: soft, non-tender; bowel sounds normal; no masses,  no organomegaly Extremities: extremities normal, atraumatic, no cyanosis or edema Neurologic: Alert and oriented X 3, normal strength and tone. Normal symmetric reflexes. Normal coordination and gait  ECOG PERFORMANCE STATUS: 1 - Symptomatic but completely ambulatory  Blood pressure 152/89, pulse 73, temperature 98.4 F (36.9 C), temperature source Oral, resp. rate 18, height $RemoveBe'5\' 8"'uMiDrqCvj$  (1.727 m), weight 203 lb 3.2 oz (92.171 kg), SpO2 99 %.  LABORATORY DATA: Lab Results  Component Value Date   WBC 3.1* 11/13/2014   HGB 9.8* 11/13/2014   HCT 29.0* 11/13/2014   MCV 93.3 11/13/2014   PLT 189 11/13/2014      Chemistry      Component Value Date/Time   NA 137 11/13/2014 1305   NA 142 10/25/2013 1238   K 3.7 11/13/2014 1305   K 3.8 10/25/2013 1238   CL 101 10/25/2013 1238   CO2 27 11/13/2014 1305   CO2 29 09/04/2013 0937   BUN 12.0 11/13/2014 1305   BUN 13 10/25/2013 1238   CREATININE 1.4* 11/13/2014 1305   CREATININE 0.90 10/25/2013 1238      Component Value Date/Time   CALCIUM 9.5 11/13/2014 1305   CALCIUM 9.3 09/04/2013 0937   ALKPHOS 68 11/13/2014 1305   ALKPHOS 48 09/04/2013 0937   AST 13 11/13/2014 1305   AST 20 09/04/2013 0937   ALT 10 11/13/2014 1305   ALT 16 09/04/2013 0937   BILITOT 0.97 11/13/2014 1305   BILITOT 1.3* 09/04/2013 0937       RADIOGRAPHIC STUDIES: Dg Forearm Left  10/22/2014   CLINICAL DATA:  Status post open reduction internal fixation left forearm  EXAM: LEFT FOREARM - 2  VIEW; DG C-ARM 61-120 MIN  COMPARISON:  09/29/2014  FINDINGS: The patient is status post open reduction internal fixation of pathologic fracture midshaft of left radius. Four views submitted for interpretation. There is intra medullary rod and a distal metallic fixation screw is noted in distal left radius. There is anatomic alignment. Again noted lytic lesion in mid shaft of left radius.  IMPRESSION: The patient is status post open reduction internal fixation of pathologic fracture midshaft of left radius. Four views submitted for interpretation. There is intra medullary rod and a distal metallic fixation screw is noted in distal left radius. There is anatomic alignment. Again noted lytic lesion in mid shaft of left radius.  Fluoroscopy time was 37 seconds.  Please see the operative report.   Electronically Signed   By: Lahoma Crocker M.D.   On: 10/22/2014 09:35   Dg C-arm 1-60 Min  10/22/2014   CLINICAL DATA:  Status post open reduction internal fixation left forearm  EXAM: LEFT FOREARM - 2 VIEW; DG C-ARM 61-120 MIN  COMPARISON:  09/29/2014  FINDINGS: The patient is status post open reduction internal fixation of pathologic fracture midshaft of left radius. Four views submitted for interpretation. There is intra medullary  rod and a distal metallic fixation screw is noted in distal left radius. There is anatomic alignment. Again noted lytic lesion in mid shaft of left radius.  IMPRESSION: The patient is status post open reduction internal fixation of pathologic fracture midshaft of left radius. Four views submitted for interpretation. There is intra medullary rod and a distal metallic fixation screw is noted in distal left radius. There is anatomic alignment. Again noted lytic lesion in mid shaft of left radius.  Fluoroscopy time was 37 seconds.  Please see the operative report.   Electronically Signed   By: Lahoma Crocker M.D.   On: 10/22/2014 09:35    ASSESSMENT AND PLAN: This is a very pleasant 69 years old  African-American male with recently diagnosed multiple myeloma.  I had a lengthy discussion with the patient and his wife about his current condition and treatment options. I gave the patient the option of palliative care and hospice referral versus consideration of systemic chemotherapy with subcutaneous Velcade 1.3 MG/M2, Revlimid 25 mg by mouth daily for 21 days every 4 weeks in addition to weekly Decadron 40 mg orally. After a lengthy discussion the patient would like to proceed with the systemic therapy. I discussed with him the adverse effect of this treatment including but not limited to mild alopecia, myelosuppression, nausea and vomiting, peripheral neuropathy, liver or renal dysfunction. I will start the patient on prophylactic acyclovir 400 mg by mouth twice a day in addition to Coumadin 2 mg by mouth daily. We will send his prescription of Revlimid to [facility]. I will also call his pharmacy with prescription for Compazine 10 mg by mouth every 6 hours as needed for nausea. He is expected to start the first cycle of his treatment next week. We would also consider the patient for treatment with Zometa and few weeks after receiving dental clearance. The patient would come back for follow-up visit in 2 weeks for reevaluation and management of any adverse effect of his treatment. He was advised to call if he has any concerning symptoms in the interval.  The patient voices understanding of current disease status and treatment options and is in agreement with the current care plan.  All questions were answered. The patient knows to call the clinic with any problems, questions or concerns. We can certainly see the patient much sooner if necessary.  I spent 20 minutes counseling the patient face to face. The total time spent in the appointment was 30 minutes.  Disclaimer: This note was dictated with voice recognition software. Similar sounding words can inadvertently be transcribed and may not  be corrected upon review.

## 2014-11-13 NOTE — Telephone Encounter (Signed)
s.w. pt and advised on JULY appt....pt ok and aware °

## 2014-11-13 NOTE — Telephone Encounter (Signed)
REVLIMID REQUIRES A PRIOR AUTHORIZATION. [facility] IS REQUESTING CLINICAL DOCUMENTATION SUCH AS LABS, OFFICE NOTES, AND PRIOR THERAPIES TRIED BY PT. THIS MESSAGE SEND TO RAQUEL BROWNING IN MANAGED CARE AND A FYI TO DR.MOHAMED AND TO MARY GARNER,RN.

## 2014-11-14 ENCOUNTER — Other Ambulatory Visit: Payer: Self-pay | Admitting: Medical Oncology

## 2014-11-14 ENCOUNTER — Telehealth: Payer: Self-pay | Admitting: Medical Oncology

## 2014-11-14 DIAGNOSIS — C9000 Multiple myeloma not having achieved remission: Secondary | ICD-10-CM

## 2014-11-14 MED ORDER — LENALIDOMIDE 25 MG PO CAPS: 25.0000 mg | ORAL_CAPSULE | Freq: Every day | ORAL | Status: DC

## 2014-11-14 NOTE — Telephone Encounter (Signed)
Faxed and Copy to chart

## 2014-11-14 NOTE — Telephone Encounter (Signed)
Faxed insurance and rx to [facility]

## 2014-11-15 ENCOUNTER — Telehealth: Payer: Self-pay | Admitting: Medical Oncology

## 2014-11-15 NOTE — Telephone Encounter (Signed)
Faxed notes, labs etc per [facility] request.

## 2014-11-15 NOTE — Telephone Encounter (Signed)
[facility] has rx and is waiting on prior auth from [place].

## 2014-11-21 ENCOUNTER — Other Ambulatory Visit: Payer: Self-pay | Admitting: Medical Oncology

## 2014-11-21 DIAGNOSIS — C9000 Multiple myeloma not having achieved remission: Secondary | ICD-10-CM

## 2014-11-22 ENCOUNTER — Ambulatory Visit (HOSPITAL_BASED_OUTPATIENT_CLINIC_OR_DEPARTMENT_OTHER): Payer: Medicare HMO

## 2014-11-22 ENCOUNTER — Telehealth: Payer: Self-pay | Admitting: Medical Oncology

## 2014-11-22 ENCOUNTER — Other Ambulatory Visit: Payer: Medicare HMO

## 2014-11-22 ENCOUNTER — Telehealth: Payer: Self-pay | Admitting: Internal Medicine

## 2014-11-22 ENCOUNTER — Encounter: Payer: Self-pay | Admitting: *Deleted

## 2014-11-22 ENCOUNTER — Other Ambulatory Visit (HOSPITAL_BASED_OUTPATIENT_CLINIC_OR_DEPARTMENT_OTHER): Payer: Medicare HMO

## 2014-11-22 ENCOUNTER — Other Ambulatory Visit: Payer: Self-pay | Admitting: Medical Oncology

## 2014-11-22 VITALS — BP 126/75 | HR 81 | Temp 98.1°F | Resp 18

## 2014-11-22 DIAGNOSIS — Z5112 Encounter for antineoplastic immunotherapy: Secondary | ICD-10-CM | POA: Diagnosis not present

## 2014-11-22 DIAGNOSIS — C9000 Multiple myeloma not having achieved remission: Secondary | ICD-10-CM

## 2014-11-22 LAB — COMPREHENSIVE METABOLIC PANEL (CC13)
ALBUMIN: 3.9 g/dL (ref 3.5–5.0)
ALT: 11 U/L (ref 0–55)
ANION GAP: 5 meq/L (ref 3–11)
AST: 14 U/L (ref 5–34)
Alkaline Phosphatase: 64 U/L (ref 40–150)
BUN: 13.6 mg/dL (ref 7.0–26.0)
CALCIUM: 9.6 mg/dL (ref 8.4–10.4)
CO2: 27 mEq/L (ref 22–29)
Chloride: 105 mEq/L (ref 98–109)
Creatinine: 1.4 mg/dL — ABNORMAL HIGH (ref 0.7–1.3)
EGFR: 61 mL/min/{1.73_m2} — ABNORMAL LOW (ref >90)
GLUCOSE: 96 mg/dL (ref 70–140)
POTASSIUM: 3.8 meq/L (ref 3.5–5.1)
SODIUM: 137 meq/L (ref 136–145)
Total Bilirubin: 0.95 mg/dL (ref 0.20–1.20)
Total Protein: 9.2 g/dL — ABNORMAL HIGH (ref 6.4–8.3)

## 2014-11-22 LAB — CBC WITH DIFFERENTIAL/PLATELET
BASO%: 0.7 % (ref 0.0–2.0)
Basophils Absolute: 0 10*3/uL (ref 0.0–0.1)
EOS%: 2.8 % (ref 0.0–7.0)
Eosinophils Absolute: 0.1 10*3/uL (ref 0.0–0.5)
HCT: 29.3 % — ABNORMAL LOW (ref 38.4–49.9)
HGB: 9.8 g/dL — ABNORMAL LOW (ref 13.0–17.1)
LYMPH%: 21.8 % (ref 14.0–49.0)
MCH: 31.3 pg (ref 27.2–33.4)
MCHC: 33.5 g/dL (ref 32.0–36.0)
MCV: 93.5 fL (ref 79.3–98.0)
MONO#: 0.5 10*3/uL (ref 0.1–0.9)
MONO%: 16.5 % — ABNORMAL HIGH (ref 0.0–14.0)
NEUT%: 58.2 % (ref 39.0–75.0)
NEUTROS ABS: 1.7 10*3/uL (ref 1.5–6.5)
PLATELETS: 168 10*3/uL (ref 140–400)
RBC: 3.13 10*6/uL — ABNORMAL LOW (ref 4.20–5.82)
RDW: 17.3 % — ABNORMAL HIGH (ref 11.0–14.6)
WBC: 3 10*3/uL — ABNORMAL LOW (ref 4.0–10.3)
lymph#: 0.6 10*3/uL — ABNORMAL LOW (ref 0.9–3.3)

## 2014-11-22 MED ORDER — WARFARIN SODIUM 2 MG PO TABS: 2.0000 mg | ORAL_TABLET | Freq: Every day | ORAL | Status: DC

## 2014-11-22 MED ORDER — BORTEZOMIB CHEMO SQ INJECTION 3.5 MG (2.5MG/ML)
1.3000 mg/m2 | Freq: Once | INTRAMUSCULAR | Status: AC
  Administered 2014-11-22: 2.75 mg via SUBCUTANEOUS
  Filled 2014-11-22: qty 2.75

## 2014-11-22 MED ORDER — ONDANSETRON HCL 8 MG PO TABS
8.0000 mg | ORAL_TABLET | Freq: Once | ORAL | Status: AC
  Administered 2014-11-22: 8 mg via ORAL

## 2014-11-22 MED ORDER — ONDANSETRON HCL 8 MG PO TABS
ORAL_TABLET | ORAL | Status: AC
  Filled 2014-11-22: qty 1

## 2014-11-22 NOTE — Telephone Encounter (Signed)
I Left a message to pick up coumadin

## 2014-11-22 NOTE — Telephone Encounter (Signed)
Pt's wife came in to let us know pt can't do his apts on 07/28 due to being out of town, sent msg to [facility] and recd a POF 07/21 to r/s apts for 07/28 to 07/29. Sent msg to r/s chemo and then I will contact pt with times... KJ

## 2014-11-22 NOTE — Patient Instructions (Addendum)
  [facility] Discharge Instructions for Patients Receiving Chemotherapy  Today you received the following chemotherapy agents: Velcade.   To help prevent nausea and vomiting after your treatment, we encourage you to take your nausea medication: Compazine. Take one every 6 hours as needed. (This may make you drowsy.)   If you develop nausea and vomiting that is not controlled by your nausea medication, call the clinic.   Begin your Acyclovir (Zovirax) as soon as you get it. Take one twice daily. This is for shingles prevention. Revlimid should be taken every evening for 3 weeks then one week off.   Begin the Dexamethasone (Decadron) on 7/22 when you receive it. 10 tablets should be taken once weekly. (Take on the same day as chemo.)    BELOW ARE SYMPTOMS THAT SHOULD BE REPORTED IMMEDIATELY:  *FEVER GREATER THAN 100.5 F  *CHILLS WITH OR WITHOUT FEVER  NAUSEA AND VOMITING THAT IS NOT CONTROLLED WITH YOUR NAUSEA MEDICATION  *UNUSUAL SHORTNESS OF BREATH  *UNUSUAL BRUISING OR BLEEDING  TENDERNESS IN MOUTH AND THROAT WITH OR WITHOUT PRESENCE OF ULCERS  *URINARY PROBLEMS  *BOWEL PROBLEMS  UNUSUAL RASH Items with * indicate a potential emergency and should be followed up as soon as possible.  Feel free to call the clinic should  you have any questions or concerns. The clinic phone number is [phone].  Please show the [place] at check-in to the Emergency Department and triage nurse.

## 2014-11-22 NOTE — Telephone Encounter (Addendum)
Pt cannot make appt 11/29/14. Onc tx request sent

## 2014-11-23 ENCOUNTER — Ambulatory Visit: Admission: RE | Admit: 2014-11-23 | Payer: Medicare HMO | Source: Ambulatory Visit

## 2014-11-23 ENCOUNTER — Encounter (HOSPITAL_COMMUNITY): Payer: Self-pay

## 2014-11-23 ENCOUNTER — Telehealth: Payer: Self-pay | Admitting: *Deleted

## 2014-11-23 ENCOUNTER — Encounter: Payer: Self-pay | Admitting: Internal Medicine

## 2014-11-23 ENCOUNTER — Other Ambulatory Visit: Payer: Self-pay | Admitting: Medical Oncology

## 2014-11-23 ENCOUNTER — Ambulatory Visit (HOSPITAL_COMMUNITY)
Admission: RE | Admit: 2014-11-23 | Discharge: 2014-11-23 | Disposition: A | Discharge status: Home / Self Care | Payer: Medicare HMO | Source: Ambulatory Visit | Attending: Radiation Oncology | Admitting: Radiation Oncology

## 2014-11-23 ENCOUNTER — Telehealth: Payer: Self-pay | Admitting: Medical Oncology

## 2014-11-23 DIAGNOSIS — R222 Localized swelling, mass and lump, trunk: Secondary | ICD-10-CM | POA: Diagnosis not present

## 2014-11-23 DIAGNOSIS — C9000 Multiple myeloma not having achieved remission: Secondary | ICD-10-CM | POA: Insufficient documentation

## 2014-11-23 DIAGNOSIS — R911 Solitary pulmonary nodule: Secondary | ICD-10-CM | POA: Diagnosis not present

## 2014-11-23 DIAGNOSIS — R112 Nausea with vomiting, unspecified: Secondary | ICD-10-CM

## 2014-11-23 MED ORDER — IOHEXOL 300 MG/ML  SOLN
100.0000 mL | Freq: Once | INTRAMUSCULAR | Status: AC | PRN
  Administered 2014-11-23: 80 mL via INTRAVENOUS

## 2014-11-23 MED ORDER — PROCHLORPERAZINE MALEATE 10 MG PO TABS: 10.0000 mg | ORAL_TABLET | Freq: Four times a day (QID) | ORAL | Status: DC | PRN

## 2014-11-23 NOTE — Progress Notes (Signed)
Pt is approved with [employer] for Revlimid from 11/21/14 to 11/20/15 or when the benefit cap has been met.  The amount of the grant is $12,500.

## 2014-11-23 NOTE — Telephone Encounter (Signed)
Unexpected finding on CT chest - note sent to [facility] and [employer]

## 2014-11-23 NOTE — Telephone Encounter (Signed)
Per staff message and POF I have scheduled appts. Advised scheduler of appts. JMW  

## 2014-11-26 ENCOUNTER — Other Ambulatory Visit: Payer: Self-pay | Admitting: Radiation Oncology

## 2014-11-26 DIAGNOSIS — C9000 Multiple myeloma not having achieved remission: Secondary | ICD-10-CM

## 2014-11-27 ENCOUNTER — Telehealth: Payer: Self-pay | Admitting: Internal Medicine

## 2014-11-27 NOTE — Telephone Encounter (Signed)
Gave pt updated schedule in chemo room .... KJ

## 2014-11-29 ENCOUNTER — Ambulatory Visit: Payer: Medicare HMO

## 2014-11-29 ENCOUNTER — Ambulatory Visit: Payer: Medicare HMO | Admitting: Radiation Oncology

## 2014-11-29 ENCOUNTER — Other Ambulatory Visit: Payer: Medicare HMO

## 2014-11-29 ENCOUNTER — Ambulatory Visit: Payer: Medicare HMO | Admitting: Physician Assistant

## 2014-11-30 ENCOUNTER — Other Ambulatory Visit (HOSPITAL_COMMUNITY): Payer: Medicare HMO

## 2014-11-30 ENCOUNTER — Encounter: Payer: Self-pay | Admitting: Physician Assistant

## 2014-11-30 ENCOUNTER — Ambulatory Visit (HOSPITAL_BASED_OUTPATIENT_CLINIC_OR_DEPARTMENT_OTHER): Payer: Medicare HMO

## 2014-11-30 ENCOUNTER — Telehealth: Payer: Self-pay | Admitting: Physician Assistant

## 2014-11-30 ENCOUNTER — Other Ambulatory Visit: Payer: Self-pay | Admitting: Radiation Oncology

## 2014-11-30 ENCOUNTER — Other Ambulatory Visit: Payer: Self-pay | Admitting: Medical Oncology

## 2014-11-30 ENCOUNTER — Ambulatory Visit (HOSPITAL_BASED_OUTPATIENT_CLINIC_OR_DEPARTMENT_OTHER): Payer: Medicare HMO | Admitting: Physician Assistant

## 2014-11-30 ENCOUNTER — Other Ambulatory Visit (HOSPITAL_BASED_OUTPATIENT_CLINIC_OR_DEPARTMENT_OTHER): Payer: Medicare HMO

## 2014-11-30 ENCOUNTER — Telehealth: Payer: Self-pay | Admitting: *Deleted

## 2014-11-30 VITALS — BP 124/76 | HR 77 | Temp 98.2°F | Resp 18 | Wt 200.0 lb

## 2014-11-30 DIAGNOSIS — C9000 Multiple myeloma not having achieved remission: Secondary | ICD-10-CM

## 2014-11-30 DIAGNOSIS — Z5112 Encounter for antineoplastic immunotherapy: Secondary | ICD-10-CM

## 2014-11-30 LAB — CBC WITH DIFFERENTIAL/PLATELET
BASO%: 0.3 % (ref 0.0–2.0)
Basophils Absolute: 0 10*3/uL (ref 0.0–0.1)
EOS%: 4.2 % (ref 0.0–7.0)
Eosinophils Absolute: 0.1 10*3/uL (ref 0.0–0.5)
HEMATOCRIT: 29.5 % — AB (ref 38.4–49.9)
HEMOGLOBIN: 9.7 g/dL — AB (ref 13.0–17.1)
LYMPH%: 14.5 % (ref 14.0–49.0)
MCH: 31.1 pg (ref 27.2–33.4)
MCHC: 33 g/dL (ref 32.0–36.0)
MCV: 94.4 fL (ref 79.3–98.0)
MONO#: 0.4 10*3/uL (ref 0.1–0.9)
MONO%: 14.1 % — ABNORMAL HIGH (ref 0.0–14.0)
NEUT#: 1.9 10*3/uL (ref 1.5–6.5)
NEUT%: 66.9 % (ref 39.0–75.0)
Platelets: 151 10*3/uL (ref 140–400)
RBC: 3.13 10*6/uL — AB (ref 4.20–5.82)
RDW: 17.3 % — AB (ref 11.0–14.6)
WBC: 2.8 10*3/uL — AB (ref 4.0–10.3)
lymph#: 0.4 10*3/uL — ABNORMAL LOW (ref 0.9–3.3)

## 2014-11-30 LAB — COMPREHENSIVE METABOLIC PANEL (CC13)
ALK PHOS: 66 U/L (ref 40–150)
ALT: 10 U/L (ref 0–55)
AST: 11 U/L (ref 5–34)
Albumin: 3.7 g/dL (ref 3.5–5.0)
Anion Gap: 6 mEq/L (ref 3–11)
BILIRUBIN TOTAL: 1.01 mg/dL (ref 0.20–1.20)
BUN: 13.3 mg/dL (ref 7.0–26.0)
CALCIUM: 9 mg/dL (ref 8.4–10.4)
CO2: 26 mEq/L (ref 22–29)
Chloride: 105 mEq/L (ref 98–109)
Creatinine: 1.4 mg/dL — ABNORMAL HIGH (ref 0.7–1.3)
EGFR: 61 mL/min/{1.73_m2} — ABNORMAL LOW (ref >90)
Glucose: 118 mg/dl (ref 70–140)
Potassium: 3.9 mEq/L (ref 3.5–5.1)
Sodium: 137 mEq/L (ref 136–145)
Total Protein: 8.4 g/dL — ABNORMAL HIGH (ref 6.4–8.3)

## 2014-11-30 MED ORDER — BORTEZOMIB CHEMO SQ INJECTION 3.5 MG (2.5MG/ML)
1.3000 mg/m2 | Freq: Once | INTRAMUSCULAR | Status: AC
  Administered 2014-11-30: 2.75 mg via SUBCUTANEOUS
  Filled 2014-11-30: qty 2.75

## 2014-11-30 MED ORDER — ONDANSETRON HCL 8 MG PO TABS
ORAL_TABLET | ORAL | Status: AC
  Filled 2014-11-30: qty 1

## 2014-11-30 MED ORDER — ONDANSETRON HCL 8 MG PO TABS
8.0000 mg | ORAL_TABLET | Freq: Once | ORAL | Status: AC
  Administered 2014-11-30: 8 mg via ORAL

## 2014-11-30 NOTE — Patient Instructions (Signed)
[facility] Discharge Instructions for Patients Receiving Chemotherapy  Today you received the following chemotherapy agents Velcade. To help prevent nausea and vomiting after your treatment, we encourage you to take your nausea medication as directed.  If you develop nausea and vomiting that is not controlled by your nausea medication, call the clinic.   BELOW ARE SYMPTOMS THAT SHOULD BE REPORTED IMMEDIATELY:  *FEVER GREATER THAN 100.5 F  *CHILLS WITH OR WITHOUT FEVER  NAUSEA AND VOMITING THAT IS NOT CONTROLLED WITH YOUR NAUSEA MEDICATION  *UNUSUAL SHORTNESS OF BREATH  *UNUSUAL BRUISING OR BLEEDING  TENDERNESS IN MOUTH AND THROAT WITH OR WITHOUT PRESENCE OF ULCERS  *URINARY PROBLEMS  *BOWEL PROBLEMS  UNUSUAL RASH Items with * indicate a potential emergency and should be followed up as soon as possible.  Feel free to call the clinic you have any questions or concerns. The clinic phone number is [phone].  Please show the CHEMO ALERT CARD at check-in to the Emergency Department and triage nurse.    

## 2014-11-30 NOTE — Progress Notes (Signed)
[facility] Telephone:([phone]   Fax:(336) [phone]  OFFICE PROGRESS NOTE  Wesley Cowden, MD [place] [address]  DIAGNOSIS:  Multiple myeloma diagnosed in May 2016  PRIOR THERAPY: Curative radiotherapy to the tumor mass at the right anterior chest under the care of Dr. [name] completed in 12/27/2013.  CURRENT THERAPY: Systemic chemotherapy with weekly subcutaneous Velcade 1.3 MG/M2, Revlimid 25 mg by mouth daily for 21 days every 4 weeks in addition to weekly Decadron 40 mg orally. Revlimid started on 11/23/2014  INTERVAL HISTORY: Wesley [name] 69 y.o. male returns to the clinic today for follow-up visit accompanied by his wife. The patient was diagnosed with multiple myeloma several weeks ago. He had a pathologic fracture of the left radius and underwent ORIF left radius fracture with intramedullary nail under the care of Dr. Grandville Silos on 10/22/2014.  He has completed one week of therapy with subcutaneous Velcade, Revlimid and dexamethasone. He tolerated treatment thus far relatively well. He denied having any significant chest pain, shortness of breath, cough or hemoptysis. The patient denied having any weight loss or night sweats. He has no nausea or vomiting.  MEDICAL HISTORY: Past Medical History  Diagnosis Date  . Pulmonary nodule, right     W/  CHEST WALL MASS--  SCHEDULED FOR BIOPSY W/ DR [name] ON FRIDAY 10-27-2013  . History of seizures as a child     AGE 69 OR 8--- NONE ISSUE SINCE  . History of GI bleed     SECONDARY TO ULCER --  DEC 2011  RESOLVED PER LAST EGD 07-05-2010  . Bladder tumor   . Hematuria   . Radiation 12/07/13-12/27/13    right anterior chest 37.5 gray  . Fracture of radius 09/29/14    left  . Cancer     multiple myeloma, bladder cancer 2015    ALLERGIES:  has No Known Allergies.  MEDICATIONS:  Current Outpatient Prescriptions  Medication Sig Dispense Refill  . acyclovir (ZOVIRAX) 400 MG  tablet Take 1 tablet (400 mg total) by mouth 2 (two) times daily. 60 tablet 2  . Aspirin-Acetaminophen-Caffeine 500-325-65 MG PACK Take 1 packet by mouth every 4 (four) hours as needed (for pain).    Marland Kitchen dexamethasone (DECADRON) 4 MG tablet 10 tab po weekly with chemo 40 tablet 5  . hyaluronate sodium (RADIAPLEXRX) GEL Apply 1 application topically 2 (two) times daily.    Marland Kitchen HYDROcodone-acetaminophen (NORCO/VICODIN) 5-325 MG per tablet Take 1-2 tablets by mouth every 6 (six) hours as needed for moderate pain. 30 tablet 0  . lenalidomide (REVLIMID) 25 MG capsule Take 1 capsule (25 mg total) by mouth daily. Take 1 capsule (25 mg)\ po every day for 21 days every 4 weeks auth number [id number] 11/14/14. Adult male- 21 capsule 0  . prochlorperazine (COMPAZINE) 10 MG tablet Take 1 tablet (10 mg total) by mouth every 6 (six) hours as needed for nausea or vomiting. 30 tablet 0  . warfarin (COUMADIN) 2 MG tablet Take 1 tablet (2 mg total) by mouth daily. 30 tablet 1   No current facility-administered medications for this visit.    SURGICAL HISTORY:  Past Surgical History  Procedure Laterality Date  . Excision benign left axilla tumor  1993  . Esophagogastroduodenoscopy  X5   LAST ONE 07-05-2010  . Transurethral resection of bladder tumor with gyrus (turbt-gyrus) N/A 10/25/2013    Procedure: TRANSURETHRAL RESECTION OF BLADDER TUMOR WITH GYRUS (TURBT-GYRUS);  Surgeon: Sharyn Creamer, MD;  Location:  [facility];  Service: Urology;  Laterality: N/A;  . Cystoscopy with biopsy N/A 10/25/2013    Procedure: CYSTOSCOPY;  Surgeon: Sharyn Creamer, MD;  Location: [facility];  Service: Urology;  Laterality: N/A;  . Bone biopsy Right 11/14/13     right seventh rib lesion  . Orif radial fracture Left 10/22/2014    Procedure: Intramedullary nailing left radius fracture;  Surgeon: Milly Jakob, MD;  Location: [facility];  Service: Orthopedics;  Laterality: Left;  OPEN  TREATMENT LEFT RADIUS FRACTURE    REVIEW OF SYSTEMS:  Constitutional: positive for fatigue Eyes: negative Ears, nose, mouth, throat, and face: negative Respiratory: negative Cardiovascular: negative Gastrointestinal: negative Genitourinary:negative Integument/breast: negative Hematologic/lymphatic: negative Musculoskeletal:positive for bone pain Neurological: negative Behavioral/Psych: negative Endocrine: negative Allergic/Immunologic: negative   PHYSICAL EXAMINATION: General appearance: alert, cooperative and no distress Head: Normocephalic, without obvious abnormality, atraumatic Neck: no adenopathy, no carotid bruit, supple, symmetrical, trachea midline and thyroid not enlarged, symmetric, no tenderness/mass/nodules Lymph nodes: Cervical, supraclavicular, and axillary nodes normal. Resp: clear to auscultation bilaterally Back: symmetric, no curvature. ROM normal. No CVA tenderness. Cardio: regular rate and rhythm, S1, S2 normal, no murmur, click, rub or gallop GI: soft, non-tender; bowel sounds normal; no masses,  no organomegaly Extremities: extremities normal, atraumatic, no cyanosis or edema and left foreare with well healed surgical incision, minimal soft tisse edema, no erythema Neurologic: Alert and oriented X 3, normal strength and tone. Normal symmetric reflexes. Normal coordination and gait  ECOG PERFORMANCE STATUS: 1 - Symptomatic but completely ambulatory  Blood pressure 124/76, pulse 77, temperature 98.2 F (36.8 C), resp. rate 18, weight 200 lb (90.719 kg), SpO2 100 %.  LABORATORY DATA: Lab Results  Component Value Date   WBC 2.8* 11/30/2014   HGB 9.7* 11/30/2014   HCT 29.5* 11/30/2014   MCV 94.4 11/30/2014   PLT 151 11/30/2014      Chemistry      Component Value Date/Time   NA 137 11/30/2014 0942   NA 142 10/25/2013 1238   K 3.9 11/30/2014 0942   K 3.8 10/25/2013 1238   CL 101 10/25/2013 1238   CO2 26 11/30/2014 0942   CO2 29 09/04/2013 0937    BUN 13.3 11/30/2014 0942   BUN 13 10/25/2013 1238   CREATININE 1.4* 11/30/2014 0942   CREATININE 0.90 10/25/2013 1238      Component Value Date/Time   CALCIUM 9.0 11/30/2014 0942   CALCIUM 9.3 09/04/2013 0937   ALKPHOS 66 11/30/2014 0942   ALKPHOS 48 09/04/2013 0937   AST 11 11/30/2014 0942   AST 20 09/04/2013 0937   ALT 10 11/30/2014 0942   ALT 16 09/04/2013 0937   BILITOT 1.01 11/30/2014 0942   BILITOT 1.3* 09/04/2013 0937       RADIOGRAPHIC STUDIES: Ct Chest W Contrast  11/23/2014   CLINICAL DATA:  Multiple myeloma diagnosed 09/2014, chest wall mass XRT complete, chemotherapy in progress  EXAM: CT CHEST WITH CONTRAST  TECHNIQUE: Multidetector CT imaging of the chest was performed during intravenous contrast administration.  CONTRAST:  31mL OMNIPAQUE IOHEXOL 300 MG/ML  SOLN  COMPARISON:  CT chest dated 02/21/2014  FINDINGS: Mediastinum/Nodes: The heart is normal in size. No pericardial effusion.  No suspicious mediastinal, hilar, or axillary lymphadenopathy.  Visualized thyroid is unremarkable.  Lungs/Pleura: Stable 9 x 10 mm subpleural nodule in the left lower lobe (series 5/ image 44), non FDG avid on prior PET.  No new/ suspicious pulmonary nodules.  No focal consolidation.  No pleural effusion or pneumothorax.  Upper abdomen: Visualized upper abdomen is notable for a 5.1 x 6.4 cm left para-aortic nodal mass and associated moderate left hydronephrosis (series 2/ image 64), incompletely visualized.  Musculoskeletal: Mottled sclerosis involving the right anterior 7th rib (series 5/ image 46) in the area of the prior lesion. Associated 6.4 x 3.8 x 4.9 cm soft tissue mass along the right lateral 7th rib (series 2/ image 38), new/increased.  Degenerative changes of the visualized thoracic spine.  IMPRESSION: New/recurrent 6.4 cm soft tissue mass along the right lateral 7th rib, adjacent to the prior the right anterior 7th rib lesion.  New 6.4 cm left para-aortic nodal mass with associated  moderate left hydronephrosis, incompletely visualized.  These findings are worrisome for lymphoma. Consider CT abdomen pelvis to assess for additional subdiaphragmatic disease.  Stable 10 mm left lower lobe pulmonary nodule, non FDG avid on prior PET.   Electronically Signed   By: Julian Hy M.D.   On: 11/23/2014 13:14    ASSESSMENT AND PLAN: This is a very pleasant 69 years old African-American male with recently diagnosed multiple myeloma.  He is currently being treated with systemic chemotherapy with subcutaneous Velcade 1.3 MG/M2, Revlimid 25 mg by mouth daily for 21 days every 4 weeks in addition to weekly Decadron 40 mg orally. Revlimid started 11/23/2014. Thus far is tolerating his treatment relatively well. He will continue with his current treatment and follow-up in 2 weeks for another symptom management visit. After a lengthy discussion the patient would like to proceed with the systemic therapy. I discussed with him the adverse effect of this treatment including but not limited to mild alopecia, myelosuppression, nausea and vomiting, peripheral neuropathy, liver or renal dysfunction. I will start the patient on prophylactic acyclovir 400 mg by mouth twice a day in addition to Coumadin 2 mg by mouth daily. We will send his prescription of Revlimid to [facility]. I will also call his pharmacy with prescription for Compazine 10 mg by mouth every 6 hours as needed for nausea. He is expected to start the first cycle of his treatment next week. We would also consider the patient for treatment with Zometa and few weeks after receiving dental clearance. The patient would come back for follow-up visit in 3 weeks for reevaluation and management of any adverse effect of his treatment. He was advised to call if he has any concerning symptoms in the interval.  The patient voices understanding of current disease status and treatment options and is in agreement with the current care plan.  All  questions were answered. The patient knows to call the clinic with any problems, questions or concerns. We can certainly see the patient much sooner if necessary.  Carlton Adam, PA-C 11/30/2014  Disclaimer: This note was dictated with voice recognition software. Similar sounding words can inadvertently be transcribed and may not be corrected upon review.

## 2014-11-30 NOTE — Telephone Encounter (Signed)
CALLED PATIENT TO INFORM OF CT FOR 12-05-14 - ARRIVAL TIME - 11:45 AM , SPOKE WITH MR. Bognar AND HE IS AWARE OF THIS TEST.

## 2014-11-30 NOTE — Telephone Encounter (Signed)
Pt confirmed labs/ov per 07/29 POF, gave pt AVS and Calendar... KJ

## 2014-12-01 NOTE — Patient Instructions (Signed)
Continue your Revlimid, dexamethasone and subcutaneous Velcade Follow-up in 3 weeks for another symptom management visit

## 2014-12-05 ENCOUNTER — Ambulatory Visit (HOSPITAL_COMMUNITY): Payer: Medicare HMO

## 2014-12-06 ENCOUNTER — Ambulatory Visit (HOSPITAL_BASED_OUTPATIENT_CLINIC_OR_DEPARTMENT_OTHER): Payer: Medicare HMO

## 2014-12-06 ENCOUNTER — Other Ambulatory Visit (HOSPITAL_BASED_OUTPATIENT_CLINIC_OR_DEPARTMENT_OTHER): Payer: Medicare HMO

## 2014-12-06 VITALS — BP 138/70 | HR 70 | Temp 98.0°F

## 2014-12-06 DIAGNOSIS — C9000 Multiple myeloma not having achieved remission: Secondary | ICD-10-CM

## 2014-12-06 DIAGNOSIS — Z5112 Encounter for antineoplastic immunotherapy: Secondary | ICD-10-CM | POA: Diagnosis not present

## 2014-12-06 LAB — COMPREHENSIVE METABOLIC PANEL (CC13)
ALK PHOS: 64 U/L (ref 40–150)
ALT: 15 U/L (ref 0–55)
ANION GAP: 7 meq/L (ref 3–11)
AST: 12 U/L (ref 5–34)
Albumin: 3.6 g/dL (ref 3.5–5.0)
BUN: 11.4 mg/dL (ref 7.0–26.0)
CHLORIDE: 105 meq/L (ref 98–109)
CO2: 28 mEq/L (ref 22–29)
Calcium: 8.8 mg/dL (ref 8.4–10.4)
Creatinine: 1.3 mg/dL (ref 0.7–1.3)
EGFR: 64 mL/min/{1.73_m2} — ABNORMAL LOW (ref >90)
Glucose: 124 mg/dl (ref 70–140)
POTASSIUM: 3.6 meq/L (ref 3.5–5.1)
Sodium: 140 mEq/L (ref 136–145)
TOTAL PROTEIN: 7.5 g/dL (ref 6.4–8.3)
Total Bilirubin: 1.21 mg/dL — ABNORMAL HIGH (ref 0.20–1.20)

## 2014-12-06 LAB — CBC WITH DIFFERENTIAL/PLATELET
BASO%: 0.3 % (ref 0.0–2.0)
BASOS ABS: 0 10*3/uL (ref 0.0–0.1)
EOS%: 3.9 % (ref 0.0–7.0)
Eosinophils Absolute: 0.2 10*3/uL (ref 0.0–0.5)
HEMATOCRIT: 28 % — AB (ref 38.4–49.9)
HGB: 9.4 g/dL — ABNORMAL LOW (ref 13.0–17.1)
LYMPH%: 15.5 % (ref 14.0–49.0)
MCH: 31 pg (ref 27.2–33.4)
MCHC: 33.6 g/dL (ref 32.0–36.0)
MCV: 92.4 fL (ref 79.3–98.0)
MONO#: 0.8 10*3/uL (ref 0.1–0.9)
MONO%: 19.7 % — ABNORMAL HIGH (ref 0.0–14.0)
NEUT#: 2.3 10*3/uL (ref 1.5–6.5)
NEUT%: 60.6 % (ref 39.0–75.0)
Platelets: 141 10*3/uL (ref 140–400)
RBC: 3.03 10*6/uL — ABNORMAL LOW (ref 4.20–5.82)
RDW: 16.8 % — ABNORMAL HIGH (ref 11.0–14.6)
WBC: 3.8 10*3/uL — AB (ref 4.0–10.3)
lymph#: 0.6 10*3/uL — ABNORMAL LOW (ref 0.9–3.3)

## 2014-12-06 MED ORDER — BORTEZOMIB CHEMO SQ INJECTION 3.5 MG (2.5MG/ML)
1.3000 mg/m2 | Freq: Once | INTRAMUSCULAR | Status: AC
  Administered 2014-12-06: 2.75 mg via SUBCUTANEOUS
  Filled 2014-12-06: qty 2.75

## 2014-12-06 MED ORDER — ONDANSETRON HCL 8 MG PO TABS
ORAL_TABLET | ORAL | Status: AC
  Filled 2014-12-06: qty 1

## 2014-12-06 MED ORDER — ONDANSETRON HCL 8 MG PO TABS
8.0000 mg | ORAL_TABLET | Freq: Once | ORAL | Status: AC
  Administered 2014-12-06: 8 mg via ORAL

## 2014-12-06 NOTE — Progress Notes (Signed)
Ok to treat with TBILI  1.2 per Dr. [name].

## 2014-12-06 NOTE — Patient Instructions (Signed)
[facility] Discharge Instructions for Patients Receiving Chemotherapy  Today you received the following chemotherapy agents Velcade. To help prevent nausea and vomiting after your treatment, we encourage you to take your nausea medication as directed.  If you develop nausea and vomiting that is not controlled by your nausea medication, call the clinic.   BELOW ARE SYMPTOMS THAT SHOULD BE REPORTED IMMEDIATELY:  *FEVER GREATER THAN 100.5 F  *CHILLS WITH OR WITHOUT FEVER  NAUSEA AND VOMITING THAT IS NOT CONTROLLED WITH YOUR NAUSEA MEDICATION  *UNUSUAL SHORTNESS OF BREATH  *UNUSUAL BRUISING OR BLEEDING  TENDERNESS IN MOUTH AND THROAT WITH OR WITHOUT PRESENCE OF ULCERS  *URINARY PROBLEMS  *BOWEL PROBLEMS  UNUSUAL RASH Items with * indicate a potential emergency and should be followed up as soon as possible.  Feel free to call the clinic you have any questions or concerns. The clinic phone number is [phone].  Please show the CHEMO ALERT CARD at check-in to the Emergency Department and triage nurse.    

## 2014-12-12 ENCOUNTER — Telehealth: Payer: Self-pay

## 2014-12-12 ENCOUNTER — Ambulatory Visit (HOSPITAL_COMMUNITY)
Admission: RE | Admit: 2014-12-12 | Discharge: 2014-12-12 | Disposition: A | Discharge status: Home / Self Care | Payer: Medicare HMO | Source: Ambulatory Visit | Attending: Radiation Oncology | Admitting: Radiation Oncology

## 2014-12-12 DIAGNOSIS — M899 Disorder of bone, unspecified: Secondary | ICD-10-CM | POA: Insufficient documentation

## 2014-12-12 DIAGNOSIS — Z923 Personal history of irradiation: Secondary | ICD-10-CM | POA: Diagnosis not present

## 2014-12-12 DIAGNOSIS — R911 Solitary pulmonary nodule: Secondary | ICD-10-CM | POA: Diagnosis not present

## 2014-12-12 DIAGNOSIS — I7090 Unspecified atherosclerosis: Secondary | ICD-10-CM | POA: Diagnosis not present

## 2014-12-12 DIAGNOSIS — N400 Enlarged prostate without lower urinary tract symptoms: Secondary | ICD-10-CM | POA: Diagnosis not present

## 2014-12-12 DIAGNOSIS — Z8551 Personal history of malignant neoplasm of bladder: Secondary | ICD-10-CM | POA: Diagnosis not present

## 2014-12-12 DIAGNOSIS — C9000 Multiple myeloma not having achieved remission: Secondary | ICD-10-CM | POA: Diagnosis present

## 2014-12-12 DIAGNOSIS — N1330 Unspecified hydronephrosis: Secondary | ICD-10-CM | POA: Diagnosis not present

## 2014-12-12 DIAGNOSIS — Z79899 Other long term (current) drug therapy: Secondary | ICD-10-CM | POA: Diagnosis not present

## 2014-12-12 MED ORDER — IOHEXOL 300 MG/ML  SOLN
50.0000 mL | Freq: Once | INTRAMUSCULAR | Status: AC | PRN
  Administered 2014-12-12: 50 mL via ORAL

## 2014-12-12 NOTE — Telephone Encounter (Signed)
CT abd pelvis from today is in epic

## 2014-12-13 ENCOUNTER — Other Ambulatory Visit: Payer: Self-pay | Admitting: Radiation Oncology

## 2014-12-13 ENCOUNTER — Ambulatory Visit (HOSPITAL_BASED_OUTPATIENT_CLINIC_OR_DEPARTMENT_OTHER): Payer: Medicare HMO

## 2014-12-13 ENCOUNTER — Other Ambulatory Visit (HOSPITAL_BASED_OUTPATIENT_CLINIC_OR_DEPARTMENT_OTHER): Payer: Medicare HMO

## 2014-12-13 VITALS — BP 120/56 | HR 70 | Temp 98.1°F | Resp 18

## 2014-12-13 DIAGNOSIS — C9000 Multiple myeloma not having achieved remission: Secondary | ICD-10-CM

## 2014-12-13 DIAGNOSIS — Z5112 Encounter for antineoplastic immunotherapy: Secondary | ICD-10-CM | POA: Diagnosis not present

## 2014-12-13 LAB — COMPREHENSIVE METABOLIC PANEL (CC13)
ALK PHOS: 83 U/L (ref 40–150)
ALT: 12 U/L (ref 0–55)
AST: 10 U/L (ref 5–34)
Albumin: 3.6 g/dL (ref 3.5–5.0)
Anion Gap: 4 mEq/L (ref 3–11)
BUN: 8.3 mg/dL (ref 7.0–26.0)
CO2: 28 meq/L (ref 22–29)
CREATININE: 1.3 mg/dL (ref 0.7–1.3)
Calcium: 8.8 mg/dL (ref 8.4–10.4)
Chloride: 106 mEq/L (ref 98–109)
EGFR: 67 mL/min/{1.73_m2} — ABNORMAL LOW (ref >90)
GLUCOSE: 94 mg/dL (ref 70–140)
POTASSIUM: 3.9 meq/L (ref 3.5–5.1)
SODIUM: 138 meq/L (ref 136–145)
Total Bilirubin: 1.29 mg/dL — ABNORMAL HIGH (ref 0.20–1.20)
Total Protein: 7.1 g/dL (ref 6.4–8.3)

## 2014-12-13 LAB — CBC WITH DIFFERENTIAL/PLATELET
BASO%: 0.4 % (ref 0.0–2.0)
BASOS ABS: 0 10*3/uL (ref 0.0–0.1)
EOS ABS: 0.1 10*3/uL (ref 0.0–0.5)
EOS%: 5.1 % (ref 0.0–7.0)
HCT: 27.3 % — ABNORMAL LOW (ref 38.4–49.9)
HGB: 9 g/dL — ABNORMAL LOW (ref 13.0–17.1)
LYMPH%: 18.6 % (ref 14.0–49.0)
MCH: 31.3 pg (ref 27.2–33.4)
MCHC: 33.1 g/dL (ref 32.0–36.0)
MCV: 94.7 fL (ref 79.3–98.0)
MONO#: 0.6 10*3/uL (ref 0.1–0.9)
MONO%: 25.6 % — ABNORMAL HIGH (ref 0.0–14.0)
NEUT#: 1.2 10*3/uL — ABNORMAL LOW (ref 1.5–6.5)
NEUT%: 50.3 % (ref 39.0–75.0)
Platelets: 133 10*3/uL — ABNORMAL LOW (ref 140–400)
RBC: 2.88 10*6/uL — AB (ref 4.20–5.82)
RDW: 18.2 % — AB (ref 11.0–14.6)
WBC: 2.4 10*3/uL — AB (ref 4.0–10.3)
lymph#: 0.4 10*3/uL — ABNORMAL LOW (ref 0.9–3.3)

## 2014-12-13 MED ORDER — ONDANSETRON HCL 8 MG PO TABS
8.0000 mg | ORAL_TABLET | Freq: Once | ORAL | Status: AC
  Administered 2014-12-13: 8 mg via ORAL

## 2014-12-13 MED ORDER — ONDANSETRON HCL 8 MG PO TABS
ORAL_TABLET | ORAL | Status: AC
  Filled 2014-12-13: qty 1

## 2014-12-13 MED ORDER — BORTEZOMIB CHEMO SQ INJECTION 3.5 MG (2.5MG/ML)
1.3000 mg/m2 | Freq: Once | INTRAMUSCULAR | Status: AC
  Administered 2014-12-13: 2.75 mg via SUBCUTANEOUS
  Filled 2014-12-13: qty 2.75

## 2014-12-13 NOTE — Patient Instructions (Signed)
[facility] Discharge Instructions for Patients Receiving Chemotherapy  Today you received the following chemotherapy agents Velcade. To help prevent nausea and vomiting after your treatment, we encourage you to take your nausea medication as directed.  If you develop nausea and vomiting that is not controlled by your nausea medication, call the clinic.   BELOW ARE SYMPTOMS THAT SHOULD BE REPORTED IMMEDIATELY:  *FEVER GREATER THAN 100.5 F  *CHILLS WITH OR WITHOUT FEVER  NAUSEA AND VOMITING THAT IS NOT CONTROLLED WITH YOUR NAUSEA MEDICATION  *UNUSUAL SHORTNESS OF BREATH  *UNUSUAL BRUISING OR BLEEDING  TENDERNESS IN MOUTH AND THROAT WITH OR WITHOUT PRESENCE OF ULCERS  *URINARY PROBLEMS  *BOWEL PROBLEMS  UNUSUAL RASH Items with * indicate a potential emergency and should be followed up as soon as possible.  Feel free to call the clinic you have any questions or concerns. The clinic phone number is [phone].  Please show the CHEMO ALERT CARD at check-in to the Emergency Department and triage nurse.    

## 2014-12-13 NOTE — Progress Notes (Signed)
Per Wesley [name] it is okay to treat with chemo today and todays labs.

## 2014-12-14 ENCOUNTER — Telehealth: Payer: Self-pay | Admitting: *Deleted

## 2014-12-14 NOTE — Telephone Encounter (Signed)
CALLED PATIENT TO INFORM THAT HE NEEDS TO CALL DR. Ralene Muskrat OFFICE AND SPEAK WITH DAWN IN THE BUSINESS OFFICE BEFORE HIS APPT. CAN BE SCHEDULED., LVM  FOR A RETURN CALL

## 2014-12-17 ENCOUNTER — Other Ambulatory Visit: Payer: Self-pay | Admitting: Medical Oncology

## 2014-12-17 ENCOUNTER — Telehealth: Payer: Self-pay | Admitting: *Deleted

## 2014-12-17 DIAGNOSIS — C9000 Multiple myeloma not having achieved remission: Secondary | ICD-10-CM

## 2014-12-17 MED ORDER — LENALIDOMIDE 25 MG PO CAPS: 25.0000 mg | ORAL_CAPSULE | Freq: Every day | ORAL | Status: DC

## 2014-12-17 NOTE — Telephone Encounter (Signed)
INSTRUCTED PT. IN THE DOSING OF THE REVLIMID-21 DAYS ON/ 7 DAYS OFF AND DECADRON TEN TABLETS WEEKLY. ENCOURAGED TO WRITE THE DIRECTIONS DOWN SO HE WOULD REMEMBER.

## 2014-12-19 ENCOUNTER — Telehealth: Payer: Self-pay | Admitting: Oncology

## 2014-12-19 NOTE — Telephone Encounter (Signed)
Called Wesley [name] and advised him to not take his coumadin starting on 12/22/14 for his CT Biopsy on 12/26/14.  He verbalized agreement but said that he was not aware that he was having a procedure.  He said he would discuss it with Dr. [name] at his appointment tomorrow.

## 2014-12-20 ENCOUNTER — Ambulatory Visit (HOSPITAL_BASED_OUTPATIENT_CLINIC_OR_DEPARTMENT_OTHER): Payer: Medicare HMO

## 2014-12-20 ENCOUNTER — Ambulatory Visit (HOSPITAL_BASED_OUTPATIENT_CLINIC_OR_DEPARTMENT_OTHER): Payer: Medicare HMO | Admitting: Physician Assistant

## 2014-12-20 ENCOUNTER — Other Ambulatory Visit (HOSPITAL_BASED_OUTPATIENT_CLINIC_OR_DEPARTMENT_OTHER): Payer: Medicare HMO

## 2014-12-20 ENCOUNTER — Ambulatory Visit
Admission: RE | Admit: 2014-12-20 | Discharge: 2014-12-20 | Disposition: A | Discharge status: Home / Self Care | Payer: Medicare HMO | Source: Ambulatory Visit | Attending: Radiation Oncology | Admitting: Radiation Oncology

## 2014-12-20 ENCOUNTER — Encounter: Payer: Self-pay | Admitting: Physician Assistant

## 2014-12-20 VITALS — BP 142/69 | HR 80 | Temp 98.2°F | Resp 18 | Ht 68.0 in | Wt 205.0 lb

## 2014-12-20 VITALS — BP 142/69 | HR 80 | Temp 98.2°F | Resp 18 | Wt 205.0 lb

## 2014-12-20 DIAGNOSIS — Z5112 Encounter for antineoplastic immunotherapy: Secondary | ICD-10-CM | POA: Diagnosis not present

## 2014-12-20 DIAGNOSIS — C9000 Multiple myeloma not having achieved remission: Secondary | ICD-10-CM

## 2014-12-20 DIAGNOSIS — R222 Localized swelling, mass and lump, trunk: Secondary | ICD-10-CM | POA: Diagnosis not present

## 2014-12-20 DIAGNOSIS — R911 Solitary pulmonary nodule: Secondary | ICD-10-CM | POA: Diagnosis not present

## 2014-12-20 LAB — COMPREHENSIVE METABOLIC PANEL (CC13)
ALK PHOS: 116 U/L (ref 40–150)
ALT: 15 U/L (ref 0–55)
ANION GAP: 10 meq/L (ref 3–11)
AST: 12 U/L (ref 5–34)
Albumin: 3.6 g/dL (ref 3.5–5.0)
BILIRUBIN TOTAL: 1.05 mg/dL (ref 0.20–1.20)
BUN: 15.1 mg/dL (ref 7.0–26.0)
CALCIUM: 8.9 mg/dL (ref 8.4–10.4)
CO2: 24 meq/L (ref 22–29)
Chloride: 109 mEq/L (ref 98–109)
Creatinine: 1.4 mg/dL — ABNORMAL HIGH (ref 0.7–1.3)
EGFR: 61 mL/min/{1.73_m2} — AB (ref >90)
Glucose: 91 mg/dl (ref 70–140)
Potassium: 3.8 mEq/L (ref 3.5–5.1)
Sodium: 143 mEq/L (ref 136–145)
TOTAL PROTEIN: 6.9 g/dL (ref 6.4–8.3)

## 2014-12-20 LAB — CBC WITH DIFFERENTIAL/PLATELET
BASO%: 0.6 % (ref 0.0–2.0)
Basophils Absolute: 0 10*3/uL (ref 0.0–0.1)
EOS ABS: 0.1 10*3/uL (ref 0.0–0.5)
EOS%: 3.4 % (ref 0.0–7.0)
HEMATOCRIT: 28.4 % — AB (ref 38.4–49.9)
HGB: 9.4 g/dL — ABNORMAL LOW (ref 13.0–17.1)
LYMPH%: 15.3 % (ref 14.0–49.0)
MCH: 31.5 pg (ref 27.2–33.4)
MCHC: 33.2 g/dL (ref 32.0–36.0)
MCV: 94.9 fL (ref 79.3–98.0)
MONO#: 1.1 10*3/uL — ABNORMAL HIGH (ref 0.1–0.9)
MONO%: 27.7 % — ABNORMAL HIGH (ref 0.0–14.0)
NEUT%: 53 % (ref 39.0–75.0)
NEUTROS ABS: 2 10*3/uL (ref 1.5–6.5)
PLATELETS: 127 10*3/uL — AB (ref 140–400)
RBC: 2.99 10*6/uL — AB (ref 4.20–5.82)
RDW: 18.6 % — ABNORMAL HIGH (ref 11.0–14.6)
WBC: 3.8 10*3/uL — ABNORMAL LOW (ref 4.0–10.3)
lymph#: 0.6 10*3/uL — ABNORMAL LOW (ref 0.9–3.3)

## 2014-12-20 MED ORDER — ACYCLOVIR 400 MG PO TABS: 400.0000 mg | ORAL_TABLET | Freq: Two times a day (BID) | ORAL | Status: DC

## 2014-12-20 MED ORDER — BORTEZOMIB CHEMO SQ INJECTION 3.5 MG (2.5MG/ML)
1.3000 mg/m2 | Freq: Once | INTRAMUSCULAR | Status: AC
  Administered 2014-12-20: 2.75 mg via SUBCUTANEOUS
  Filled 2014-12-20: qty 2.75

## 2014-12-20 MED ORDER — ONDANSETRON HCL 8 MG PO TABS
8.0000 mg | ORAL_TABLET | Freq: Once | ORAL | Status: AC
  Administered 2014-12-20: 8 mg via ORAL

## 2014-12-20 MED ORDER — ONDANSETRON HCL 8 MG PO TABS
ORAL_TABLET | ORAL | Status: AC
  Filled 2014-12-20: qty 1

## 2014-12-20 NOTE — Patient Instructions (Signed)
[facility] Discharge Instructions for Patients Receiving Chemotherapy  Today you received the following chemotherapy agents:  Velcade  To help prevent nausea and vomiting after your treatment, we encourage you to take your nausea medication as ordered per MD.   If you develop nausea and vomiting that is not controlled by your nausea medication, call the clinic.   BELOW ARE SYMPTOMS THAT SHOULD BE REPORTED IMMEDIATELY:  *FEVER GREATER THAN 100.5 F  *CHILLS WITH OR WITHOUT FEVER  NAUSEA AND VOMITING THAT IS NOT CONTROLLED WITH YOUR NAUSEA MEDICATION  *UNUSUAL SHORTNESS OF BREATH  *UNUSUAL BRUISING OR BLEEDING  TENDERNESS IN MOUTH AND THROAT WITH OR WITHOUT PRESENCE OF ULCERS  *URINARY PROBLEMS  *BOWEL PROBLEMS  UNUSUAL RASH Items with * indicate a potential emergency and should be followed up as soon as possible.  Feel free to call the clinic you have any questions or concerns. The clinic phone number is [phone].  Please show the CHEMO ALERT CARD at check-in to the Emergency Department and triage nurse.   

## 2014-12-20 NOTE — Progress Notes (Addendum)
[facility] [phone]   [phone]  OFFICE PROGRESS NOTE  [name], MD [place] [address]  DIAGNOSIS:  Multiple myeloma diagnosed in May 2016  PRIOR THERAPY: Curative radiotherapy to the tumor mass at the right anterior chest under the care of Dr. [name] completed in 12/27/2013.  CURRENT THERAPY: Systemic chemotherapy with weekly subcutaneous Velcade 1.3 MG/M2, Revlimid 25 mg by mouth daily for 21 days every 4 weeks in addition to weekly Decadron 40 mg orally. Revlimid started on 11/23/2014  INTERVAL HISTORY: Wesley [name] 69 y.o. male returns to the clinic today for follow-up visit accompanied by his wife. The patient was diagnosed with multiple myeloma several weeks ago. He had a pathologic fracture of the left radius and underwent ORIF left radius fracture with intramedullary nail under the care of Dr. Grandville Silos on 10/22/2014.  He is currently being treated with subcutaneous Velcade, Revlimid and dexamethasone. He has tolerated treatment thus far relatively well. He denied having any significant chest pain, shortness of breath, cough or hemoptysis. The patient denied having any weight loss or night sweats. He has no nausea or vomiting. He is scheduled to have a biopsy of his lung lesion next week.  MEDICAL HISTORY: Past Medical History  Diagnosis Date  . Pulmonary nodule, right     W/  CHEST WALL MASS--  SCHEDULED FOR BIOPSY W/ DR [name] ON FRIDAY 10-27-2013  . History of seizures as a child     AGE 89 OR 8--- NONE ISSUE SINCE  . History of GI bleed     SECONDARY TO ULCER --  DEC 2011  RESOLVED PER LAST EGD 07-05-2010  . Bladder tumor   . Hematuria   . Radiation 12/07/13-12/27/13    right anterior chest 37.5 gray  . Fracture of radius 09/29/14    left  . Cancer     multiple myeloma, bladder cancer 2015    ALLERGIES:  has No Known Allergies.  MEDICATIONS:  Current Outpatient Prescriptions    Medication Sig Dispense Refill  . acyclovir (ZOVIRAX) 400 MG tablet Take 1 tablet (400 mg total) by mouth 2 (two) times daily. 60 tablet 2  . Aspirin-Acetaminophen-Caffeine 500-325-65 MG PACK Take 1 packet by mouth every 4 (four) hours as needed (for pain).    Marland Kitchen dexamethasone (DECADRON) 4 MG tablet 10 tab po weekly with chemo 40 tablet 5  . hyaluronate sodium (RADIAPLEXRX) GEL Apply 1 application topically 2 (two) times daily.    Marland Kitchen HYDROcodone-acetaminophen (NORCO/VICODIN) 5-325 MG per tablet Take 1-2 tablets by mouth every 6 (six) hours as needed for moderate pain. 30 tablet 0  . lenalidomide (REVLIMID) 25 MG capsule Take 1 capsule (25 mg total) by mouth daily. Take 1 capsule (25 mg)\ po every day for 21 days every 4 weeks auth number [id number]. Adult male- 21 capsule 0  . prochlorperazine (COMPAZINE) 10 MG tablet Take 1 tablet (10 mg total) by mouth every 6 (six) hours as needed for nausea or vomiting. 30 tablet 0  . warfarin (COUMADIN) 2 MG tablet Take 1 tablet (2 mg total) by mouth daily. 30 tablet 1   No current facility-administered medications for this visit.    SURGICAL HISTORY:  Past Surgical History  Procedure Laterality Date  . Excision benign left axilla tumor  1993  . Esophagogastroduodenoscopy  X5   LAST ONE 07-05-2010  . Transurethral resection of bladder tumor with gyrus (turbt-gyrus) N/A 10/25/2013    Procedure: TRANSURETHRAL RESECTION OF BLADDER  TUMOR WITH GYRUS (TURBT-GYRUS);  Surgeon: Sharyn Creamer, MD;  Location: [facility];  Service: Urology;  Laterality: N/A;  . Cystoscopy with biopsy N/A 10/25/2013    Procedure: CYSTOSCOPY;  Surgeon: Sharyn Creamer, MD;  Location[facility];  Service: Urology;  Laterality: N/A;  . Bone biopsy Right 11/14/13     right seventh rib lesion  . Orif radial fracture Left 10/22/2014    Procedure: Intramedullary nailing left radius fracture;  Surgeon: Milly Jakob, MD;  Location: [facility];  Service: Orthopedics;  Laterality: Left;  OPEN TREATMENT LEFT RADIUS FRACTURE    REVIEW OF SYSTEMS:  Constitutional: positive for fatigue Eyes: negative Ears, nose, mouth, throat, and face: negative Respiratory: negative Cardiovascular: negative Gastrointestinal: negative Genitourinary:negative Integument/breast: negative Hematologic/lymphatic: negative Musculoskeletal:positive for bone pain Neurological: negative Behavioral/Psych: negative Endocrine: negative Allergic/Immunologic: negative   PHYSICAL EXAMINATION: General appearance: alert, cooperative and no distress Head: Normocephalic, without obvious abnormality, atraumatic Neck: no adenopathy, no carotid bruit, supple, symmetrical, trachea midline and thyroid not enlarged, symmetric, no tenderness/mass/nodules Lymph nodes: Cervical, supraclavicular, and axillary nodes normal. Resp: clear to auscultation bilaterally Back: symmetric, no curvature. ROM normal. No CVA tenderness. Cardio: regular rate and rhythm, S1, S2 normal, no murmur, click, rub or gallop GI: soft, non-tender; bowel sounds normal; no masses,  no organomegaly Extremities: extremities normal, atraumatic, no cyanosis or edema and left foreare with well healed surgical incision, minimal soft tisse edema, no erythema Neurologic: Alert and oriented X 3, normal strength and tone. Normal symmetric reflexes. Normal coordination and gait  ECOG PERFORMANCE STATUS: 1 - Symptomatic but completely ambulatory  Blood pressure 142/69, pulse 80, temperature 98.2 F (36.8 C), temperature source Oral, resp. rate 18, height $RemoveBe'5\' 8"'FNkpPpnVR$  (1.727 m), weight 205 lb (92.987 kg), SpO2 100 %.  LABORATORY DATA: Lab Results  Component Value Date   WBC 3.8* 12/20/2014   HGB 9.4* 12/20/2014   HCT 28.4* 12/20/2014   MCV 94.9 12/20/2014   PLT 127* 12/20/2014      Chemistry      Component Value Date/Time   NA 143 12/20/2014 1506   NA 142 10/25/2013 1238   K 3.8 12/20/2014 1506    K 3.8 10/25/2013 1238   CL 101 10/25/2013 1238   CO2 24 12/20/2014 1506   CO2 29 09/04/2013 0937   BUN 15.1 12/20/2014 1506   BUN 13 10/25/2013 1238   CREATININE 1.4* 12/20/2014 1506   CREATININE 0.90 10/25/2013 1238      Component Value Date/Time   CALCIUM 8.9 12/20/2014 1506   CALCIUM 9.3 09/04/2013 0937   ALKPHOS 116 12/20/2014 1506   ALKPHOS 48 09/04/2013 0937   AST 12 12/20/2014 1506   AST 20 09/04/2013 0937   ALT 15 12/20/2014 1506   ALT 16 09/04/2013 0937   BILITOT 1.05 12/20/2014 1506   BILITOT 1.3* 09/04/2013 0937       RADIOGRAPHIC STUDIES: Ct Abdomen Pelvis Wo Contrast  12/12/2014   CLINICAL DATA:  69 year old male with history of multiple myeloma undergoing ongoing chemotherapy. 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 Additional history of bladder cancer status post surgical resection in 2015.  EXAM: CT ABDOMEN AND PELVIS WITHOUT CONTRAST  TECHNIQUE: Multidetector CT imaging of the abdomen and pelvis was performed following the standard protocol without IV contrast.  COMPARISON:  Chest CT 11/23/2014. PET-CT 10/27/2013. CT of the abdomen and pelvis 09/27/2013.  FINDINGS: Lower chest: Small amount of ground-glass attenuation in the inferior  aspect of the right lower lobe which may reflect some evolving postradiation changes. 10 mm left lower lobe pleural-based nodule (image 11 of series 4) unchanged compared to prior study from 09/27/2013, favored to be benign (prior PET-CT 10/27/2013 demonstrated no convincing hypermetabolism in this lesion).  Hepatobiliary: No discrete cystic or solid hepatic lesions are identified on today's noncontrast CT examination. The unenhanced appearance of the gallbladder is normal.  Pancreas: No pancreatic mass or peripancreatic inflammatory changes on today's noncontrast CT examination.  Spleen: Unremarkable.  Adrenals/Urinary Tract: Severe left hydronephrosis is noted. This appears to be related to a  large soft tissue mass causing obstruction in the region of the left renal pelvis. This mass is intimately associated with the left renal pelvis and proximal left ureter, and is estimated to measure approximately 4.8 x 4.3 x 7.4 cm (images 36 of series 2 and coronal image 54 of series 602). The origin of this lesion is uncertain on today's noncontrast CT examination, however, this is favored to be urothelial in origin, centered at the left ureteropelvic junction. Alternatively, this could simply represent an enlarged retroperitoneal lymph nodal mass, or other primary retroperitoneal lesion such as a sarcoma. This comes in contact with the undersurface of the left adrenal gland, but the left adrenal gland is otherwise normal in appearance. Right adrenal gland and right kidney are normal in appearance. Urinary bladder is remarkable for some circumferential wall thickening. There is also a a very large mass in the base of the urinary bladder which appears contiguous with the underlying prostate gland, favored once again to reflect profound median lobe hypertrophy of the prostate gland.  Stomach/Bowel: The unenhanced appearance of the stomach is normal. No pathologic dilatation of small bowel or colon. Normal appendix.  Vascular/Lymphatic: Atherosclerosis throughout the abdominal and pelvic vasculature, without definite aneurysm. Other than the previously described left upper retroperitoneal soft tissue mass (which could be nodal in origin) there are no additional enlarged lymph nodes identified in the abdomen or pelvis on today's noncontrast CT examination.  Reproductive: Prostate gland is markedly enlarged measuring approximately 7.1 x 7.4 cm. There appears to be severe irregular mass-like median lobe hypertrophy, with the median lobe of the prostate gland impressing upon the base of the urinary bladder and extending into the lumen, with this component of the gland measuring approximately 6.8 x 4.7 x 4.4 cm. Seminal  vesicles are unremarkable in appearance.  Other: No significant volume of ascites.  No pneumoperitoneum.  Musculoskeletal: 6.3 x 2.8 cm expansile lytic lesion with large soft tissue component in the lateral aspect of the seventh rib appears slightly smaller than prior examination 11/23/2014. Mixed lucent and sclerotic lesion in the more anterior aspect of the right seventh rib appears smaller than prior studies, compatible with a treated metastatic or myelomatous lesion. Several tiny lucent lesions in the left ilium (image 53 of series 2) are unchanged compared to 09/27/2013, favored to be benign.  IMPRESSION: 1. Interval development of a large left retroperitoneal mass which appears centered in the region of the left ureteropelvic junction, favored to be urothelial and origin, highly concerning for urothelial neoplasm. Alternatively, this could simply represent a lymphoma, extraosseous involvement from multiple myeloma, or other retroperitoneal neoplasm. This is causing obstruction of the left kidney with severe left hydronephrosis and perinephric stranding at this time. Urologic consultation is strongly recommended. 2. Massively enlarged prostate gland with severe median lobe hypertrophy. This has significantly increased compared to the prior examination. This is again associated with circumferential bladder wall thickening,  suggesting chronic bladder outlet obstruction. 3. Slight interval decrease in expansile lytic lesion in the lateral aspect of the right seventh rib, presumably indicative of a positive response to radiation therapy. 4. Unchanged 1 cm left lower lobe pulmonary nodule which is stable compared to prior studies dating back to 09/27/2013, favored to be benign. 5. Normal appendix. 6. Atherosclerosis. These results will be called to the ordering clinician or representative by the Radiologist Assistant, and communication documented in the PACS or zVision Dashboard.   Electronically Signed   By: Vinnie Langton M.D.   On: 12/12/2014 11:22   Ct Chest W Contrast  11/23/2014   CLINICAL DATA:  Multiple myeloma diagnosed 09/2014, chest wall mass XRT complete, chemotherapy in progress  EXAM: CT CHEST WITH CONTRAST  TECHNIQUE: Multidetector CT imaging of the chest was performed during intravenous contrast administration.  CONTRAST:  11mL OMNIPAQUE IOHEXOL 300 MG/ML  SOLN  COMPARISON:  CT chest dated 02/21/2014  FINDINGS: Mediastinum/Nodes: The heart is normal in size. No pericardial effusion.  No suspicious mediastinal, hilar, or axillary lymphadenopathy.  Visualized thyroid is unremarkable.  Lungs/Pleura: Stable 9 x 10 mm subpleural nodule in the left lower lobe (series 5/ image 44), non FDG avid on prior PET.  No new/ suspicious pulmonary nodules.  No focal consolidation.  No pleural effusion or pneumothorax.  Upper abdomen: Visualized upper abdomen is notable for a 5.1 x 6.4 cm left para-aortic nodal mass and associated moderate left hydronephrosis (series 2/ image 64), incompletely visualized.  Musculoskeletal: Mottled sclerosis involving the right anterior 7th rib (series 5/ image 46) in the area of the prior lesion. Associated 6.4 x 3.8 x 4.9 cm soft tissue mass along the right lateral 7th rib (series 2/ image 38), new/increased.  Degenerative changes of the visualized thoracic spine.  IMPRESSION: New/recurrent 6.4 cm soft tissue mass along the right lateral 7th rib, adjacent to the prior the right anterior 7th rib lesion.  New 6.4 cm left para-aortic nodal mass with associated moderate left hydronephrosis, incompletely visualized.  These findings are worrisome for lymphoma. Consider CT abdomen pelvis to assess for additional subdiaphragmatic disease.  Stable 10 mm left lower lobe pulmonary nodule, non FDG avid on prior PET.   Electronically Signed   By: Julian Hy M.D.   On: 11/23/2014 13:14    ASSESSMENT AND PLAN: This is a very pleasant 70 years old African-American male with recently diagnosed  multiple myeloma.  He is currently being treated with systemic chemotherapy with subcutaneous Velcade 1.3 MG/M2, Revlimid 25 mg by mouth daily for 21 days every 4 weeks in addition to weekly Decadron 40 mg orally. Revlimid started 11/23/2014. Thus far is tolerating his treatment relatively well. He will continue with his current treatment and follow-up in 2 weeks for another symptom management visit. We would also consider the patient for treatment with Zometa and few weeks after receiving dental clearance. He is encouraged to keep his appointment for the lung nodule biopsy. He was advised to call if he has any concerning symptoms in the interval.  The patient voices understanding of current disease status and treatment options and is in agreement with the current care plan.  All questions were answered. The patient knows to call the clinic with any problems, questions or concerns. We can certainly see the patient much sooner if necessary.  Carlton Adam, PA-C 12/20/2014  ADDENDUM: Hematology/Oncology Attending: I had a face to face encounter with the patient. I recommended his care plan. This is a very pleasant  69 years old African-American male with multiple myeloma and recent findings on CT scan of the chest, abdomen and pelvis concerning for a second malignancy. The patient is tolerating his treatment with subcutaneous Velcade, Revlimid and Decadron fairly well. He is scheduled to have CT-guided core biopsy of the left retroperitoneal mass by interventional radiology next week. We will continue to monitor the patient closely and bring him back for follow-up visit in 2 weeks for reevaluation and discussion of his treatment options based on the biopsy results. He will continue with his current treatment for now. The patient was advised to call immediately if he has any concerning symptoms in the interval.  Disclaimer: This note was dictated with voice recognition software. Similar sounding  words can inadvertently be transcribed and may not be corrected upon review. Eilleen Kempf., MD 12/27/2014

## 2014-12-20 NOTE — Progress Notes (Signed)
Radiation Oncology         [phone] ________________________________  Name: Wesley [name] MRN: [id number]  Date: 12/20/2014  DOB: February 12, 1946  Follow-Up Visit Note  CC: [name], MD  [name], MD    ICD-9-CM ICD-10-CM   1. 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 The patient denies angina, back pain, or spitting up of blood. The patient is without complaint. He projected a healthy mental status and was accompanied by his wife for today's radiation oncology visit. The patient and his wife were made aware of the results of the most recent CT scans and the need for a biopsy before moving forward with possible treatment. It has been difficult contacting the patient via phone.  ALLERGIES:  has No Known Allergies.  Meds: Current Outpatient Prescriptions  Medication Sig Dispense Refill  . acyclovir (ZOVIRAX) 400 MG tablet Take 1 tablet (400 mg total) by mouth 2 (two) times daily. 60 tablet 2  . Aspirin-Acetaminophen-Caffeine 500-325-65 MG PACK Take 1 packet by mouth every 4 (four) hours as needed (for pain).    Marland Kitchen dexamethasone (DECADRON) 4 MG tablet 10 tab po weekly with chemo 40 tablet 5  . hyaluronate sodium (RADIAPLEXRX) GEL Apply 1 application topically 2 (two) times daily.    Marland Kitchen HYDROcodone-acetaminophen (NORCO/VICODIN) 5-325 MG per tablet Take 1-2 tablets by mouth every 6 (six) hours as needed for moderate pain. 30 tablet 0  . lenalidomide (REVLIMID) 25 MG capsule Take 1 capsule (25 mg total) by mouth daily. Take 1 capsule (25 mg)\ po every day for 21 days every 4 weeks auth number [id number]. Adult male- 21 capsule 0  . prochlorperazine (COMPAZINE) 10 MG tablet Take 1 tablet (10 mg  total) by mouth every 6 (six) hours as needed for nausea or vomiting. 30 tablet 0  . warfarin (COUMADIN) 2 MG tablet Take 1 tablet (2 mg total) by mouth daily. 30 tablet 1   No current facility-administered medications for this encounter.    Physical Findings:  weight is 205 lb (92.987 kg). His temperature is 98.2 F (36.8 C). His blood pressure is 142/69 and his pulse is 80. His respiration is 18 and oxygen saturation is 100%.   The patient's lungs are clear, heart has regular rate and rhythm with no palpable cervical, supraclavicular, or axillary adenopathy. Skin in the treated area has a mild hyperpigmentation change with no burning sensation of the skin. The patient is in no acute distress and the patient is alert and oriented x3. No pain with palpation along the right anterior chest or right lateral chest or left flank area  Lab Findings: Lab Results  Component Value Date   WBC 3.8* 12/20/2014   HGB 9.4* 12/20/2014   HCT 28.4* 12/20/2014   MCV 94.9 12/20/2014   PLT 127* 12/20/2014    Radiographic Findings: Ct Abdomen Pelvis Wo Contrast  12/12/2014   CLINICAL DATA:  69 year old male with history of multiple myeloma undergoing ongoing chemotherapy. 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 Additional history of bladder cancer status post surgical resection in 2015.  EXAM: CT ABDOMEN AND PELVIS WITHOUT CONTRAST  TECHNIQUE: Multidetector CT imaging of the abdomen and pelvis was performed following the standard protocol without IV contrast.  COMPARISON:  Chest CT 11/23/2014. PET-CT 10/27/2013. CT of the abdomen and pelvis 09/27/2013.  FINDINGS: Lower chest: Small amount of ground-glass attenuation in the inferior aspect of the right lower lobe which may reflect some evolving postradiation changes. 10 mm left lower lobe pleural-based nodule (image 11 of series 4) unchanged compared to prior study from 09/27/2013, favored to be benign (prior PET-CT  10/27/2013 demonstrated no convincing hypermetabolism in this lesion).  Hepatobiliary: No discrete cystic or solid hepatic lesions are identified on today's noncontrast CT examination. The unenhanced appearance of the gallbladder is normal.  Pancreas: No pancreatic mass or peripancreatic inflammatory changes on today's noncontrast CT examination.  Spleen: Unremarkable.  Adrenals/Urinary Tract: Severe left hydronephrosis is noted. This appears to be related to a large soft tissue mass causing obstruction in the region of the left renal pelvis. This mass is intimately associated with the left renal pelvis and proximal left ureter, and is estimated to measure approximately 4.8 x 4.3 x 7.4 cm (images 36 of series 2 and coronal image 54 of series 602). The origin of this lesion is uncertain on today's noncontrast CT examination, however, this is favored to be urothelial in origin, centered at the left ureteropelvic junction. Alternatively, this could simply represent an enlarged retroperitoneal lymph nodal mass, or other primary retroperitoneal lesion such as a sarcoma. This comes in contact with the undersurface of the left adrenal gland, but the left adrenal gland is otherwise normal in appearance. Right adrenal gland and right kidney are normal in appearance. Urinary bladder is remarkable for some circumferential wall thickening. There is also a a very large mass in the base of the urinary bladder which appears contiguous with the underlying prostate gland, favored once again to reflect profound median lobe hypertrophy of the prostate gland.  Stomach/Bowel: The unenhanced appearance of the stomach is normal. No pathologic dilatation of small bowel or colon. Normal appendix.  Vascular/Lymphatic: Atherosclerosis throughout the abdominal and pelvic vasculature, without definite aneurysm. Other than the previously described left upper retroperitoneal soft tissue mass (which could be nodal in origin) there are no  additional enlarged lymph nodes identified in the abdomen or pelvis on today's noncontrast CT examination.  Reproductive: Prostate gland is markedly enlarged measuring approximately 7.1 x 7.4 cm. There appears to be severe irregular mass-like median lobe hypertrophy, with the median lobe of the prostate gland impressing upon the base of the urinary bladder and extending into the lumen, with this component of the gland measuring approximately 6.8 x 4.7 x 4.4 cm. Seminal vesicles are unremarkable in appearance.  Other: No significant volume of ascites.  No pneumoperitoneum.  Musculoskeletal: 6.3 x 2.8 cm expansile lytic lesion with large soft tissue component in the lateral aspect of the seventh rib appears slightly smaller than prior examination 11/23/2014. Mixed lucent and sclerotic lesion in the more anterior aspect of the right seventh rib appears smaller than prior studies, compatible with a treated metastatic or myelomatous lesion. Several tiny lucent lesions in the left ilium (image 53 of series 2) are unchanged compared to 09/27/2013, favored to be benign.  IMPRESSION: 1. Interval development of a large left retroperitoneal mass which appears centered in the region of the left ureteropelvic junction, favored to be urothelial and origin, highly concerning for urothelial neoplasm. Alternatively, this could simply represent a lymphoma, extraosseous involvement from multiple myeloma, or other retroperitoneal neoplasm. This is causing obstruction of the left kidney with severe left hydronephrosis and perinephric stranding at this time. Urologic consultation is strongly recommended. 2. Massively enlarged prostate  gland with severe median lobe hypertrophy. This has significantly increased compared to the prior examination. This is again associated with circumferential bladder wall thickening, suggesting chronic bladder outlet obstruction. 3. Slight interval decrease in expansile lytic lesion in the lateral aspect of  the right seventh rib, presumably indicative of a positive response to radiation therapy. 4. Unchanged 1 cm left lower lobe pulmonary nodule which is stable compared to prior studies dating back to 09/27/2013, favored to be benign. 5. Normal appendix. 6. Atherosclerosis. These results will be called to the ordering clinician or representative by the Radiologist Assistant, and communication documented in the PACS or zVision Dashboard.   Electronically Signed   By: Vinnie Langton M.D.   On: 12/12/2014 11:22   Ct Chest W Contrast  11/23/2014   CLINICAL DATA:  Multiple myeloma diagnosed 09/2014, chest wall mass XRT complete, chemotherapy in progress  EXAM: CT CHEST WITH CONTRAST  TECHNIQUE: Multidetector CT imaging of the chest was performed during intravenous contrast administration.  CONTRAST:  19mL OMNIPAQUE IOHEXOL 300 MG/ML  SOLN  COMPARISON:  CT chest dated 02/21/2014  FINDINGS: Mediastinum/Nodes: The heart is normal in size. No pericardial effusion.  No suspicious mediastinal, hilar, or axillary lymphadenopathy.  Visualized thyroid is unremarkable.  Lungs/Pleura: Stable 9 x 10 mm subpleural nodule in the left lower lobe (series 5/ image 44), non FDG avid on prior PET.  No new/ suspicious pulmonary nodules.  No focal consolidation.  No pleural effusion or pneumothorax.  Upper abdomen: Visualized upper abdomen is notable for a 5.1 x 6.4 cm left para-aortic nodal mass and associated moderate left hydronephrosis (series 2/ image 64), incompletely visualized.  Musculoskeletal: Mottled sclerosis involving the right anterior 7th rib (series 5/ image 46) in the area of the prior lesion. Associated 6.4 x 3.8 x 4.9 cm soft tissue mass along the right lateral 7th rib (series 2/ image 38), new/increased.  Degenerative changes of the visualized thoracic spine.  IMPRESSION: New/recurrent 6.4 cm soft tissue mass along the right lateral 7th rib, adjacent to the prior the right anterior 7th rib lesion.  New 6.4 cm left  para-aortic nodal mass with associated moderate left hydronephrosis, incompletely visualized.  These findings are worrisome for lymphoma. Consider CT abdomen pelvis to assess for additional subdiaphragmatic disease.  Stable 10 mm left lower lobe pulmonary nodule, non FDG avid on prior PET.   Electronically Signed   By: Julian Hy M.D.   On: 11/23/2014 13:14     Impression: Wesley [name] is a 69 year old male presenting to clinic in regards to his malignant right chest wall lesion consistent with poorly differentiated malignancy of unclear primary The patient is recovering from the effects of radiation. Results of the CT scan were reviewed and a biopsy is scheduled. Any questions vocalized via the patient and his wife in regards to the scans were addressed.     Plan: The patient was advised of healthy methods of management of symptoms vocalized during today's appointment. Hydronephrosis was clearly explained and defined. Any confusion expressed by the patient and his wife, when reviewing the results of his most recent CT scans were addressed. The patient is aware of the biopsy to be scheduled and that he must be off of the prescribed medication coumadin when this takes place. The patient is advised of his follow-up appointment with radiation oncology to take place as scheduled. The patient will be contacted with results of his biopsy . All questions and concerns vocalized were fully addressed. If he develops any  further questions or concerns in regards to his treatment, he has been encouraged to contact Dr. [name], MD, PhD.   This document serves as a record of services personally performed by Gery Pray, MD. It was created on his behalf by Lenn Cal, a trained medical scribe. The creation of this record is based on the scribe's personal observations and the provider's statements to them. This document has been checked and approved by the attending provider.     ____________________________________  Blair Promise, PhD, MD

## 2014-12-25 ENCOUNTER — Other Ambulatory Visit: Payer: Self-pay | Admitting: Radiology

## 2014-12-26 ENCOUNTER — Encounter (HOSPITAL_COMMUNITY): Payer: Self-pay

## 2014-12-26 ENCOUNTER — Ambulatory Visit (HOSPITAL_COMMUNITY)
Admission: RE | Admit: 2014-12-26 | Discharge: 2014-12-26 | Disposition: A | Discharge status: Home / Self Care | Payer: Medicare HMO | Source: Ambulatory Visit | Attending: Radiation Oncology | Admitting: Radiation Oncology

## 2014-12-26 DIAGNOSIS — Z79899 Other long term (current) drug therapy: Secondary | ICD-10-CM | POA: Diagnosis not present

## 2014-12-26 DIAGNOSIS — R1900 Intra-abdominal and pelvic swelling, mass and lump, unspecified site: Secondary | ICD-10-CM | POA: Diagnosis not present

## 2014-12-26 DIAGNOSIS — C9000 Multiple myeloma not having achieved remission: Secondary | ICD-10-CM | POA: Insufficient documentation

## 2014-12-26 DIAGNOSIS — Z9221 Personal history of antineoplastic chemotherapy: Secondary | ICD-10-CM | POA: Diagnosis not present

## 2014-12-26 DIAGNOSIS — N1330 Unspecified hydronephrosis: Secondary | ICD-10-CM | POA: Diagnosis not present

## 2014-12-26 DIAGNOSIS — Z923 Personal history of irradiation: Secondary | ICD-10-CM | POA: Insufficient documentation

## 2014-12-26 DIAGNOSIS — Z7901 Long term (current) use of anticoagulants: Secondary | ICD-10-CM | POA: Diagnosis not present

## 2014-12-26 DIAGNOSIS — N400 Enlarged prostate without lower urinary tract symptoms: Secondary | ICD-10-CM | POA: Diagnosis not present

## 2014-12-26 DIAGNOSIS — M549 Dorsalgia, unspecified: Secondary | ICD-10-CM | POA: Diagnosis not present

## 2014-12-26 LAB — CBC
HCT: 28.4 % — ABNORMAL LOW (ref 39.0–52.0)
Hemoglobin: 9.3 g/dL — ABNORMAL LOW (ref 13.0–17.0)
MCH: 30.9 pg (ref 26.0–34.0)
MCHC: 32.7 g/dL (ref 30.0–36.0)
MCV: 94.4 fL (ref 78.0–100.0)
PLATELETS: 148 10*3/uL — AB (ref 150–400)
RBC: 3.01 MIL/uL — AB (ref 4.22–5.81)
RDW: 18.1 % — AB (ref 11.5–15.5)
WBC: 3 10*3/uL — ABNORMAL LOW (ref 4.0–10.5)

## 2014-12-26 LAB — PROTIME-INR
INR: 1.09 (ref 0.00–1.49)
PROTHROMBIN TIME: 14.3 s (ref 11.6–15.2)

## 2014-12-26 LAB — APTT: aPTT: 30 seconds (ref 24–37)

## 2014-12-26 MED ORDER — HYDROCODONE-ACETAMINOPHEN 5-325 MG PO TABS: 1.0000 | ORAL_TABLET | ORAL | Status: DC | PRN

## 2014-12-26 MED ORDER — MIDAZOLAM HCL 2 MG/2ML IJ SOLN
INTRAMUSCULAR | Status: AC | PRN
  Administered 2014-12-26: 1 mg via INTRAVENOUS

## 2014-12-26 MED ORDER — FENTANYL CITRATE (PF) 100 MCG/2ML IJ SOLN
INTRAMUSCULAR | Status: AC
  Filled 2014-12-26: qty 2

## 2014-12-26 MED ORDER — MIDAZOLAM HCL 2 MG/2ML IJ SOLN
INTRAMUSCULAR | Status: AC
  Filled 2014-12-26: qty 4

## 2014-12-26 MED ORDER — FENTANYL CITRATE (PF) 100 MCG/2ML IJ SOLN
INTRAMUSCULAR | Status: AC | PRN
  Administered 2014-12-26: 25 ug via INTRAVENOUS

## 2014-12-26 MED ORDER — LIDOCAINE HCL 1 % IJ SOLN
INTRAMUSCULAR | Status: AC
  Filled 2014-12-26: qty 20

## 2014-12-26 MED ORDER — SODIUM CHLORIDE 0.9 % IV SOLN: Freq: Once | INTRAVENOUS | Status: DC

## 2014-12-26 MED ORDER — SODIUM CHLORIDE 0.9 % IV SOLN
INTRAVENOUS | Status: AC | PRN
  Administered 2014-12-26: 10 mL/h via INTRAVENOUS

## 2014-12-26 NOTE — Progress Notes (Signed)
Patient ID: Wesley [name], male   DOB: 08-Aug-1945, 69 y.o.   MRN: [id number]    Referring Physician(s): Kinard,James  Chief Complaint:  Multiple myeloma  Subjective:  Pt familiar to IR service from prior BM biopsy 09/2014 and rt seventh rib lesion biopsy  11/2013. He has history of kappa light chain myeloma and poorly differentiated malignancy of unclear primary from rt chest wall/rib lesion. He is currently undergoing chemoradiation. Follow up CT on 12/12/14 revealed interval development of a large left RP mass with associated hydronephrosis as well as massively enlarged prostate. He presents today for CT guided biopsy of the left RP mass for further evaluation. He denies recent fevers, CP,dyspnea, abd pain,N/V or abnormal bleeding. He does have intermittent back pain.   Allergies: Review of patient's allergies indicates no known allergies.  Medications: Prior to Admission medications   Medication Sig Start Date End Date Taking? Authorizing Provider  acyclovir (ZOVIRAX) 400 MG tablet Take 1 tablet (400 mg total) by mouth 2 (two) times daily. 12/20/14  Yes Carlton Adam, PA-C  dexamethasone (DECADRON) 4 MG tablet 10 tab po weekly with chemo 11/13/14  Yes [name], MD  lenalidomide (REVLIMID) 25 MG capsule Take 1 capsule (25 mg total) by mouth daily. Take 1 capsule (25 mg)_0 po every day for 21 days every 4 weeks auth number [id number]. Adult male- 12/17/14  Yes [name], MD  warfarin (COUMADIN) 2 MG tablet Take 1 tablet (2 mg total) by mouth daily. 11/22/14  Yes [name], MD  Aspirin-Acetaminophen-Caffeine [phone] MG PACK Take 1 packet by mouth every 4 (four) hours as needed (for pain).    Historical Provider, MD  prochlorperazine (COMPAZINE) 10 MG tablet Take 1 tablet (10 mg total) by mouth every 6 (six) hours as needed for nausea or vomiting. 11/23/14   [name], MD     Vital Signs: BP 152/79 mmHg  Pulse 59  Temp(Src) 98 F (36.7 C) (Oral)  Resp 18  Ht  5' 8" (1.727 m)  Wt 206 lb (93.441 kg)  BMI 31.33 kg/m2  SpO2 100%  Physical Exam pt awake/alert; chest- CTA bilat; heart- RRR; abd- soft,+BS, NT; ext- trace bilat pretibial edema; hx left radius fx with ORIF 10/2014  Imaging: No results found.  Labs:  CBC:  Recent Labs  12/06/14 1109 12/13/14 1116 12/20/14 1506 12/26/14 0915  WBC 3.8* 2.4* 3.8* 3.0*  HGB 9.4* 9.0* 9.4* 9.3*  HCT 28.0* 27.3* 28.4* 28.4*  PLT 141 133* 127* 148*    COAGS:  Recent Labs  10/02/14 0730 12/26/14 0915  INR 1.13 1.09  APTT 34 30    BMP:  Recent Labs  11/30/14 0942 12/06/14 1109 12/13/14 1116 12/20/14 1506  NA 137 140 138 143  K 3.9 3.6 3.9 3.8  CO2 _1 GLUCOSE 118 124 94 91  BUN 13.3 11.4 8.3 15.1  CALCIUM 9.0 8.8 8.8 8.9  CREATININE 1.4* 1.3 1.3 1.4*    LIVER FUNCTION TESTS:  Recent Labs  11/30/14 0942 12/06/14 1109 12/13/14 1116 12/20/14 1506  BILITOT 1.01 1.21* 1.29* 1.05  AST _2 ALT _3 ALKPHOS 66 64 83 116  PROT 8.4* 7.5 7.1 6.9  ALBUMIN 3.7 3.6 3.6 3.6    Assessment and Plan: Pt with hx multiple myeloma and poorly diff malignancy rt chest wall/rib lesion 2015, prior chemoradiation. Now with newly discovered large left RP mass, massively enlarged prostate. He presents today for CT guided biopsy of the RP  mass.Risks and benefits discussed with the patient/wife including, but not limited to bleeding, infection, damage to adjacent structures or low yield requiring additional tests.All of the patient's questions were answered, patient is agreeable to proceed.Consent signed and in chart.     Signed: D. Rowe Robert 12/26/2014, 10:17 AM   I spent a total of 15 minutes at the the patient's bedside AND on the patient's hospital floor or unit, greater than 50% of which was counseling/coordinating care for CT guided left RP mass biopsy

## 2014-12-26 NOTE — Sedation Documentation (Signed)
Patient denies pain and is resting comfortably.  

## 2014-12-26 NOTE — Procedures (Signed)
CT core bx L retroperit mass 18g x3 to surg path No complication No blood loss. See complete dictation in [facility]

## 2014-12-26 NOTE — Discharge Instructions (Signed)
Biopsy °Biopsy °Care After °Refer to this sheet in the next few weeks. These instructions provide you with information on caring for yourself after your procedure. Your caregiver may also give you more specific instructions. Your treatment has been planned according to current medical practices, but problems sometimes occur. Call your caregiver if you have any problems or questions after your procedure. °If you had a fine needle biopsy, you may have soreness at the biopsy site for 1 to 2 days. If you had an open biopsy, you may have soreness at the biopsy site for 3 to 4 days. °HOME CARE INSTRUCTIONS  °· You may resume normal diet and activities as directed. °· Change bandages (dressings) as directed. If your wound was closed with a skin glue (adhesive), it will wear off and begin to peel in 7 days. °· Only take over-the-counter or prescription medicines for pain, discomfort, or fever as directed by your caregiver. °· Ask your caregiver when you can bathe and get your wound wet. °SEEK IMMEDIATE MEDICAL CARE IF:  °· You have increased bleeding (more than a small spot) from the biopsy site. °· You notice redness, swelling, or increasing pain at the biopsy site. °· You have pus coming from the biopsy site. °· You have a fever. °· You notice a bad smell coming from the biopsy site or dressing. °· You have a rash, have difficulty breathing, or have any allergic problems. °MAKE SURE YOU:  °· Understand these instructions. °· Will watch your condition. °· Will get help right away if you are not doing well or get worse. °Document Released: 11/07/2004 Document Revised: 07/13/2011 Document Reviewed: 10/16/2010 °ExitCare® Patient Information ©2015 [employer]. This information is not intended to replace advice given to you by your health care provider. Make sure you discuss any questions you have with your health care provider. ° °

## 2014-12-27 ENCOUNTER — Telehealth: Payer: Self-pay | Admitting: *Deleted

## 2014-12-27 ENCOUNTER — Other Ambulatory Visit (HOSPITAL_BASED_OUTPATIENT_CLINIC_OR_DEPARTMENT_OTHER): Payer: Medicare HMO

## 2014-12-27 ENCOUNTER — Ambulatory Visit (HOSPITAL_BASED_OUTPATIENT_CLINIC_OR_DEPARTMENT_OTHER): Payer: Medicare HMO

## 2014-12-27 VITALS — BP 151/88 | HR 54 | Temp 98.1°F | Resp 20

## 2014-12-27 DIAGNOSIS — C9000 Multiple myeloma not having achieved remission: Secondary | ICD-10-CM

## 2014-12-27 DIAGNOSIS — Z5112 Encounter for antineoplastic immunotherapy: Secondary | ICD-10-CM | POA: Diagnosis not present

## 2014-12-27 LAB — CBC WITH DIFFERENTIAL/PLATELET
BASO%: 1.1 % (ref 0.0–2.0)
BASOS ABS: 0 10*3/uL (ref 0.0–0.1)
EOS ABS: 0.1 10*3/uL (ref 0.0–0.5)
EOS%: 2.5 % (ref 0.0–7.0)
HEMATOCRIT: 28.6 % — AB (ref 38.4–49.9)
HEMOGLOBIN: 9.3 g/dL — AB (ref 13.0–17.1)
LYMPH#: 0.7 10*3/uL — AB (ref 0.9–3.3)
LYMPH%: 20.2 % (ref 14.0–49.0)
MCH: 30.8 pg (ref 27.2–33.4)
MCHC: 32.5 g/dL (ref 32.0–36.0)
MCV: 94.7 fL (ref 79.3–98.0)
MONO#: 1.1 10*3/uL — ABNORMAL HIGH (ref 0.1–0.9)
MONO%: 31.6 % — AB (ref 0.0–14.0)
NEUT%: 44.6 % (ref 39.0–75.0)
NEUTROS ABS: 1.6 10*3/uL (ref 1.5–6.5)
Platelets: 128 10*3/uL — ABNORMAL LOW (ref 140–400)
RBC: 3.02 10*6/uL — ABNORMAL LOW (ref 4.20–5.82)
RDW: 18 % — AB (ref 11.0–14.6)
WBC: 3.6 10*3/uL — AB (ref 4.0–10.3)

## 2014-12-27 LAB — COMPREHENSIVE METABOLIC PANEL (CC13)
ALBUMIN: 3.5 g/dL (ref 3.5–5.0)
ALK PHOS: 113 U/L (ref 40–150)
ALT: 12 U/L (ref 0–55)
AST: 12 U/L (ref 5–34)
Anion Gap: 6 mEq/L (ref 3–11)
BILIRUBIN TOTAL: 1 mg/dL (ref 0.20–1.20)
BUN: 14.7 mg/dL (ref 7.0–26.0)
CALCIUM: 8.9 mg/dL (ref 8.4–10.4)
CO2: 25 mEq/L (ref 22–29)
Chloride: 112 mEq/L — ABNORMAL HIGH (ref 98–109)
Creatinine: 1.6 mg/dL — ABNORMAL HIGH (ref 0.7–1.3)
EGFR: 52 mL/min/{1.73_m2} — AB (ref >90)
GLUCOSE: 97 mg/dL (ref 70–140)
Potassium: 4.2 mEq/L (ref 3.5–5.1)
SODIUM: 143 meq/L (ref 136–145)
TOTAL PROTEIN: 6.8 g/dL (ref 6.4–8.3)

## 2014-12-27 MED ORDER — ONDANSETRON HCL 8 MG PO TABS
8.0000 mg | ORAL_TABLET | Freq: Once | ORAL | Status: AC
  Administered 2014-12-27: 8 mg via ORAL

## 2014-12-27 MED ORDER — BORTEZOMIB CHEMO SQ INJECTION 3.5 MG (2.5MG/ML)
1.3000 mg/m2 | Freq: Once | INTRAMUSCULAR | Status: AC
  Administered 2014-12-27: 2.75 mg via SUBCUTANEOUS
  Filled 2014-12-27: qty 2.75

## 2014-12-27 MED ORDER — ONDANSETRON HCL 8 MG PO TABS
ORAL_TABLET | ORAL | Status: AC
  Filled 2014-12-27: qty 1

## 2014-12-27 NOTE — Telephone Encounter (Signed)
Patient called asking what to do with his medications.  "I had a Biopsy yesterday.  Was told to stop my coumadin.  What am I to do now with the coumadin.  I received Revlimid and have not taken anymore of this since December 14, 2014 or the steroid.  When do I start this.  Return number [phone].  At 3:50pm this nurse advised he is to automatically resume revlimid on day eight which was August 20th.  He will resume today with steroid and awaiting call from staff about coumadin.

## 2014-12-27 NOTE — Patient Instructions (Signed)
[facility] Discharge Instructions for Patients Receiving Chemotherapy  Today you received the following chemotherapy agents Velcade  To help prevent nausea and vomiting after your treatment, we encourage you to take your nausea medication    If you develop nausea and vomiting that is not controlled by your nausea medication, call the clinic.   BELOW ARE SYMPTOMS THAT SHOULD BE REPORTED IMMEDIATELY:  *FEVER GREATER THAN 100.5 F  *CHILLS WITH OR WITHOUT FEVER  NAUSEA AND VOMITING THAT IS NOT CONTROLLED WITH YOUR NAUSEA MEDICATION  *UNUSUAL SHORTNESS OF BREATH  *UNUSUAL BRUISING OR BLEEDING  TENDERNESS IN MOUTH AND THROAT WITH OR WITHOUT PRESENCE OF ULCERS  *URINARY PROBLEMS  *BOWEL PROBLEMS  UNUSUAL RASH Items with * indicate a potential emergency and should be followed up as soon as possible.  Feel free to call the clinic you have any questions or concerns. The clinic phone number is [phone].  Please show the CHEMO ALERT CARD at check-in to the Emergency Department and triage nurse.   

## 2014-12-27 NOTE — Telephone Encounter (Signed)
He can resume his coumadin today

## 2014-12-27 NOTE — Progress Notes (Signed)
Creatinine = 1.6 today.  Okay to treat, per Dr. [name].

## 2014-12-27 NOTE — Telephone Encounter (Signed)
Called patient with order to resume coumadin today per Dr. [name].

## 2014-12-27 NOTE — Patient Instructions (Signed)
Keep the appointment for your lung nodule biopsy Continue labs and chemotherapy as scheduled Follow up in 2 weeks

## 2015-01-03 ENCOUNTER — Other Ambulatory Visit (HOSPITAL_BASED_OUTPATIENT_CLINIC_OR_DEPARTMENT_OTHER): Payer: Medicare HMO

## 2015-01-03 ENCOUNTER — Other Ambulatory Visit: Payer: Self-pay

## 2015-01-03 ENCOUNTER — Other Ambulatory Visit: Payer: Self-pay | Admitting: Nurse Practitioner

## 2015-01-03 ENCOUNTER — Encounter: Payer: Medicare HMO | Admitting: Nurse Practitioner

## 2015-01-03 ENCOUNTER — Other Ambulatory Visit: Payer: Self-pay | Admitting: Internal Medicine

## 2015-01-03 ENCOUNTER — Ambulatory Visit (HOSPITAL_BASED_OUTPATIENT_CLINIC_OR_DEPARTMENT_OTHER): Payer: Medicare HMO

## 2015-01-03 VITALS — BP 134/76 | HR 75 | Temp 97.7°F | Resp 18

## 2015-01-03 DIAGNOSIS — C9000 Multiple myeloma not having achieved remission: Secondary | ICD-10-CM

## 2015-01-03 DIAGNOSIS — Z5112 Encounter for antineoplastic immunotherapy: Secondary | ICD-10-CM

## 2015-01-03 LAB — COMPREHENSIVE METABOLIC PANEL (CC13)
ALK PHOS: 109 U/L (ref 40–150)
ALT: 9 U/L (ref 0–55)
ANION GAP: 7 meq/L (ref 3–11)
AST: 12 U/L (ref 5–34)
Albumin: 3.8 g/dL (ref 3.5–5.0)
BILIRUBIN TOTAL: 1.59 mg/dL — AB (ref 0.20–1.20)
BUN: 13.7 mg/dL (ref 7.0–26.0)
CO2: 27 meq/L (ref 22–29)
Calcium: 9 mg/dL (ref 8.4–10.4)
Chloride: 112 mEq/L — ABNORMAL HIGH (ref 98–109)
Creatinine: 1.7 mg/dL — ABNORMAL HIGH (ref 0.7–1.3)
EGFR: 46 mL/min/{1.73_m2} — AB (ref >90)
Glucose: 120 mg/dl (ref 70–140)
POTASSIUM: 3.5 meq/L (ref 3.5–5.1)
Sodium: 146 mEq/L — ABNORMAL HIGH (ref 136–145)
TOTAL PROTEIN: 6.9 g/dL (ref 6.4–8.3)

## 2015-01-03 LAB — CBC WITH DIFFERENTIAL/PLATELET
BASO%: 0.7 % (ref 0.0–2.0)
BASOS ABS: 0 10*3/uL (ref 0.0–0.1)
EOS ABS: 0.1 10*3/uL (ref 0.0–0.5)
EOS%: 3.7 % (ref 0.0–7.0)
HEMATOCRIT: 29.2 % — AB (ref 38.4–49.9)
HEMOGLOBIN: 9.5 g/dL — AB (ref 13.0–17.1)
LYMPH#: 0.4 10*3/uL — AB (ref 0.9–3.3)
LYMPH%: 14.6 % (ref 14.0–49.0)
MCH: 30.6 pg (ref 27.2–33.4)
MCHC: 32.5 g/dL (ref 32.0–36.0)
MCV: 94.2 fL (ref 79.3–98.0)
MONO#: 0.5 10*3/uL (ref 0.1–0.9)
MONO%: 15.3 % — ABNORMAL HIGH (ref 0.0–14.0)
NEUT#: 1.9 10*3/uL (ref 1.5–6.5)
NEUT%: 65.7 % (ref 39.0–75.0)
Platelets: 124 10*3/uL — ABNORMAL LOW (ref 140–400)
RBC: 3.1 10*6/uL — ABNORMAL LOW (ref 4.20–5.82)
RDW: 18 % — AB (ref 11.0–14.6)
WBC: 2.9 10*3/uL — ABNORMAL LOW (ref 4.0–10.3)

## 2015-01-03 MED ORDER — ONDANSETRON HCL 8 MG PO TABS
8.0000 mg | ORAL_TABLET | Freq: Once | ORAL | Status: AC
  Administered 2015-01-03: 8 mg via ORAL

## 2015-01-03 MED ORDER — BORTEZOMIB CHEMO SQ INJECTION 3.5 MG (2.5MG/ML)
1.3000 mg/m2 | Freq: Once | INTRAMUSCULAR | Status: AC
  Administered 2015-01-03: 2.75 mg via SUBCUTANEOUS
  Filled 2015-01-03: qty 2.75

## 2015-01-03 MED ORDER — FUROSEMIDE 20 MG PO TABS: 20.0000 mg | ORAL_TABLET | ORAL | Status: DC

## 2015-01-03 MED ORDER — POTASSIUM CHLORIDE CRYS ER 20 MEQ PO TBCR: EXTENDED_RELEASE_TABLET | ORAL | Status: DC

## 2015-01-03 MED ORDER — ONDANSETRON HCL 8 MG PO TABS
ORAL_TABLET | ORAL | Status: AC
  Filled 2015-01-03: qty 1

## 2015-01-03 NOTE — Progress Notes (Signed)
Pt c/o bilat leg edema.  Denies any SOB or other new symptoms.   Reveiwed complaint with Dr. [name]; and was advised that pt should try Lasix 20 mg on every other day basis PRN. Should take potassium 20 meq PO only on the days he takes the Lasix.   Advised pt to call/return for any worsening symptoms whatsoever.

## 2015-01-03 NOTE — Progress Notes (Signed)
Okay to treat with today's labs per Dr. [name].  Pt presents with some BLE swelling.  No complaints of shortness of breath.  Per Selena Lesser, NP pt to take 20mg  Lasix every other day as needed as well as 20 mEq of K+ on days he takes Lasix.  Pt and wife verbalized understanding and have no further questions regarding this at this time.  Pt informed to call us if he notices any changes.

## 2015-01-03 NOTE — Patient Instructions (Signed)
[facility] Discharge Instructions for Patients Receiving Chemotherapy  Today you received the following chemotherapy agents Velcade. To help prevent nausea and vomiting after your treatment, we encourage you to take your nausea medication as directed.  If you develop nausea and vomiting that is not controlled by your nausea medication, call the clinic.   BELOW ARE SYMPTOMS THAT SHOULD BE REPORTED IMMEDIATELY:  *FEVER GREATER THAN 100.5 F  *CHILLS WITH OR WITHOUT FEVER  NAUSEA AND VOMITING THAT IS NOT CONTROLLED WITH YOUR NAUSEA MEDICATION  *UNUSUAL SHORTNESS OF BREATH  *UNUSUAL BRUISING OR BLEEDING  TENDERNESS IN MOUTH AND THROAT WITH OR WITHOUT PRESENCE OF ULCERS  *URINARY PROBLEMS  *BOWEL PROBLEMS  UNUSUAL RASH Items with * indicate a potential emergency and should be followed up as soon as possible.  Feel free to call the clinic you have any questions or concerns. The clinic phone number is [phone].  Please show the CHEMO ALERT CARD at check-in to the Emergency Department and triage nurse.    

## 2015-01-10 ENCOUNTER — Encounter: Payer: Self-pay | Admitting: Physician Assistant

## 2015-01-10 ENCOUNTER — Ambulatory Visit: Payer: Medicare HMO

## 2015-01-10 ENCOUNTER — Ambulatory Visit (HOSPITAL_BASED_OUTPATIENT_CLINIC_OR_DEPARTMENT_OTHER): Payer: Medicare HMO

## 2015-01-10 ENCOUNTER — Other Ambulatory Visit (HOSPITAL_BASED_OUTPATIENT_CLINIC_OR_DEPARTMENT_OTHER): Payer: Medicare HMO

## 2015-01-10 ENCOUNTER — Ambulatory Visit (HOSPITAL_BASED_OUTPATIENT_CLINIC_OR_DEPARTMENT_OTHER): Payer: Medicare HMO | Admitting: Physician Assistant

## 2015-01-10 VITALS — BP 147/92 | HR 61 | Temp 98.2°F | Resp 18 | Ht 68.0 in | Wt 209.9 lb

## 2015-01-10 DIAGNOSIS — C9000 Multiple myeloma not having achieved remission: Secondary | ICD-10-CM

## 2015-01-10 DIAGNOSIS — Z5112 Encounter for antineoplastic immunotherapy: Secondary | ICD-10-CM

## 2015-01-10 LAB — COMPREHENSIVE METABOLIC PANEL (CC13)
ALBUMIN: 3.6 g/dL (ref 3.5–5.0)
ALK PHOS: 97 U/L (ref 40–150)
ALT: 13 U/L (ref 0–55)
ANION GAP: 7 meq/L (ref 3–11)
AST: 12 U/L (ref 5–34)
BILIRUBIN TOTAL: 1.2 mg/dL (ref 0.20–1.20)
BUN: 17.6 mg/dL (ref 7.0–26.0)
CALCIUM: 8.5 mg/dL (ref 8.4–10.4)
CO2: 25 mEq/L (ref 22–29)
Chloride: 109 mEq/L (ref 98–109)
Creatinine: 1.9 mg/dL — ABNORMAL HIGH (ref 0.7–1.3)
EGFR: 42 mL/min/{1.73_m2} — AB (ref >90)
Glucose: 90 mg/dl (ref 70–140)
POTASSIUM: 3.9 meq/L (ref 3.5–5.1)
Sodium: 141 mEq/L (ref 136–145)
TOTAL PROTEIN: 6.4 g/dL (ref 6.4–8.3)

## 2015-01-10 LAB — CBC WITH DIFFERENTIAL/PLATELET
BASO%: 0.3 % (ref 0.0–2.0)
BASOS ABS: 0 10*3/uL (ref 0.0–0.1)
EOS ABS: 0.2 10*3/uL (ref 0.0–0.5)
EOS%: 5.7 % (ref 0.0–7.0)
HEMATOCRIT: 28.5 % — AB (ref 38.4–49.9)
HEMOGLOBIN: 9.2 g/dL — AB (ref 13.0–17.1)
LYMPH#: 0.4 10*3/uL — AB (ref 0.9–3.3)
LYMPH%: 10.5 % — ABNORMAL LOW (ref 14.0–49.0)
MCH: 30.9 pg (ref 27.2–33.4)
MCHC: 32.4 g/dL (ref 32.0–36.0)
MCV: 95.4 fL (ref 79.3–98.0)
MONO#: 0.9 10*3/uL (ref 0.1–0.9)
MONO%: 24.6 % — AB (ref 0.0–14.0)
NEUT%: 58.9 % (ref 39.0–75.0)
NEUTROS ABS: 2.3 10*3/uL (ref 1.5–6.5)
PLATELETS: 112 10*3/uL — AB (ref 140–400)
RBC: 2.99 10*6/uL — ABNORMAL LOW (ref 4.20–5.82)
RDW: 18.7 % — AB (ref 11.0–14.6)
WBC: 3.8 10*3/uL — ABNORMAL LOW (ref 4.0–10.3)

## 2015-01-10 MED ORDER — ONDANSETRON HCL 8 MG PO TABS
ORAL_TABLET | ORAL | Status: AC
  Filled 2015-01-10: qty 1

## 2015-01-10 MED ORDER — ONDANSETRON HCL 8 MG PO TABS
8.0000 mg | ORAL_TABLET | Freq: Once | ORAL | Status: AC
Start: 2015-01-10 — End: 2015-01-10
  Administered 2015-01-10: 8 mg via ORAL

## 2015-01-10 MED ORDER — BORTEZOMIB CHEMO SQ INJECTION 3.5 MG (2.5MG/ML)
1.3000 mg/m2 | Freq: Once | INTRAMUSCULAR | Status: AC
  Administered 2015-01-10: 2.75 mg via SUBCUTANEOUS
  Filled 2015-01-10: qty 2.75

## 2015-01-10 NOTE — Patient Instructions (Signed)
[facility] Discharge Instructions for Patients Receiving Chemotherapy  Today you received the following chemotherapy agents Velcade. To help prevent nausea and vomiting after your treatment, we encourage you to take your nausea medication as directed.  If you develop nausea and vomiting that is not controlled by your nausea medication, call the clinic.   BELOW ARE SYMPTOMS THAT SHOULD BE REPORTED IMMEDIATELY:  *FEVER GREATER THAN 100.5 F  *CHILLS WITH OR WITHOUT FEVER  NAUSEA AND VOMITING THAT IS NOT CONTROLLED WITH YOUR NAUSEA MEDICATION  *UNUSUAL SHORTNESS OF BREATH  *UNUSUAL BRUISING OR BLEEDING  TENDERNESS IN MOUTH AND THROAT WITH OR WITHOUT PRESENCE OF ULCERS  *URINARY PROBLEMS  *BOWEL PROBLEMS  UNUSUAL RASH Items with * indicate a potential emergency and should be followed up as soon as possible.  Feel free to call the clinic you have any questions or concerns. The clinic phone number is [phone].  Please show the CHEMO ALERT CARD at check-in to the Emergency Department and triage nurse.    

## 2015-01-10 NOTE — Progress Notes (Addendum)
[place] [phone]   [phone]  OFFICE PROGRESS NOTE  [name], MD [place] [address]  DIAGNOSIS:  Multiple myeloma diagnosed in May 2016  PRIOR THERAPY: Curative radiotherapy to the tumor mass at the right anterior chest under the care of Dr. [name] completed in 12/27/2013.  CURRENT THERAPY: Systemic chemotherapy with weekly subcutaneous Velcade 1.3 MG/M2, Revlimid 25 mg by mouth daily for 21 days every 4 weeks in addition to weekly Decadron 40 mg orally. Revlimid started on 11/23/2014  INTERVAL HISTORY: Wesley [name] 69 y.o. male returns to the clinic today for follow-up visit accompanied by his wife. The patient was diagnosed with multiple myeloma several weeks ago. He had a pathologic fracture of the left radius and underwent ORIF left radius fracture with intramedullary nail under the care of Dr. Grandville Silos on 10/22/2014.  He is currently being treated with subcutaneous Velcade, Revlimid and dexamethasone. He continues to tolerate his treatment relatively well. He denied having any significant chest pain, shortness of breath, cough or hemoptysis. The patient denied having any weight loss or night sweats. He has no nausea or vomiting. He had a biopsy of his lung nodule on 12/26/2014.  MEDICAL HISTORY: Past Medical History  Diagnosis Date  . Pulmonary nodule, right     W/  CHEST WALL MASS--  SCHEDULED FOR BIOPSY W/ DR [name] ON FRIDAY 10-27-2013  . History of seizures as a child     AGE 79 OR 8--- NONE ISSUE SINCE  . History of GI bleed     SECONDARY TO ULCER --  DEC 2011  RESOLVED PER LAST EGD 07-05-2010  . Bladder tumor   . Hematuria   . Radiation 12/07/13-12/27/13    right anterior chest 37.5 gray  . Fracture of radius 09/29/14    left  . Cancer     multiple myeloma, bladder cancer 2015    ALLERGIES:  has No Known Allergies.  MEDICATIONS:  Current Outpatient Prescriptions  Medication Sig  Dispense Refill  . acyclovir (ZOVIRAX) 400 MG tablet Take 1 tablet (400 mg total) by mouth 2 (two) times daily. 60 tablet 2  . Aspirin-Acetaminophen-Caffeine 500-325-65 MG PACK Take 1 packet by mouth every 4 (four) hours as needed (for pain).    Marland Kitchen dexamethasone (DECADRON) 4 MG tablet 10 tab po weekly with chemo 40 tablet 5  . furosemide (LASIX) 20 MG tablet Take 1 tablet (20 mg total) by mouth every other day. 20 tablet 0  . lenalidomide (REVLIMID) 25 MG capsule Take 1 capsule (25 mg total) by mouth daily. Take 1 capsule (25 mg)\ po every day for 21 days every 4 weeks auth number [id number]. Adult male- 21 capsule 0  . potassium chloride SA (K-DUR,KLOR-CON) 20 MEQ tablet Take 1 tablet (20 meq) PO only on the days that you take Lasix. 20 tablet 0  . prochlorperazine (COMPAZINE) 10 MG tablet Take 1 tablet (10 mg total) by mouth every 6 (six) hours as needed for nausea or vomiting. 30 tablet 0  . warfarin (COUMADIN) 2 MG tablet Take 1 tablet (2 mg total) by mouth daily. 30 tablet 1   No current facility-administered medications for this visit.    SURGICAL HISTORY:  Past Surgical History  Procedure Laterality Date  . Excision benign left axilla tumor  1993  . Esophagogastroduodenoscopy  X5   LAST ONE 07-05-2010  . Transurethral resection of bladder tumor with gyrus (turbt-gyrus) N/A 10/25/2013    Procedure: TRANSURETHRAL RESECTION  OF BLADDER TUMOR WITH GYRUS (TURBT-GYRUS);  Surgeon: Sharyn Creamer, MD;  Location: [facility];  Service: Urology;  Laterality: N/A;  . Cystoscopy with biopsy N/A 10/25/2013    Procedure: CYSTOSCOPY;  Surgeon: Sharyn Creamer, MD;  Location: [facility];  Service: Urology;  Laterality: N/A;  . Bone biopsy Right 11/14/13     right seventh rib lesion  . Orif radial fracture Left 10/22/2014    Procedure: Intramedullary nailing left radius fracture;  Surgeon: Milly Jakob, MD;  Location: [place];  Service:  Orthopedics;  Laterality: Left;  OPEN TREATMENT LEFT RADIUS FRACTURE    REVIEW OF SYSTEMS:  Constitutional: positive for fatigue Eyes: negative Ears, nose, mouth, throat, and face: negative Respiratory: negative Cardiovascular: negative Gastrointestinal: negative Genitourinary:negative Integument/breast: negative Hematologic/lymphatic: negative Musculoskeletal:positive for bone pain Neurological: negative Behavioral/Psych: negative Endocrine: negative Allergic/Immunologic: negative   PHYSICAL EXAMINATION: General appearance: alert, cooperative and no distress Head: Normocephalic, without obvious abnormality, atraumatic Neck: no adenopathy, no carotid bruit, supple, symmetrical, trachea midline and thyroid not enlarged, symmetric, no tenderness/mass/nodules Lymph nodes: Cervical, supraclavicular, and axillary nodes normal. Resp: clear to auscultation bilaterally Back: symmetric, no curvature. ROM normal. No CVA tenderness. Cardio: regular rate and rhythm, S1, S2 normal, no murmur, click, rub or gallop GI: soft, non-tender; bowel sounds normal; no masses,  no organomegaly Extremities: extremities normal, atraumatic, no cyanosis or edema and left foreare with well healed surgical incision, minimal soft tisse edema, no erythema Neurologic: Alert and oriented X 3, normal strength and tone. Normal symmetric reflexes. Normal coordination and gait  ECOG PERFORMANCE STATUS: 1 - Symptomatic but completely ambulatory  Blood pressure 147/92, pulse 61, temperature 98.2 F (36.8 C), temperature source Oral, resp. rate 18, height $RemoveBe'5\' 8"'BYUdPDkFv$  (1.727 m), weight 209 lb 14.4 oz (95.21 kg), SpO2 100 %.  LABORATORY DATA: Lab Results  Component Value Date   WBC 3.8* 01/10/2015   HGB 9.2* 01/10/2015   HCT 28.5* 01/10/2015   MCV 95.4 01/10/2015   PLT 112* 01/10/2015      Chemistry      Component Value Date/Time   NA 141 01/10/2015 1131   NA 142 10/25/2013 1238   K 3.9 01/10/2015 1131   K 3.8  10/25/2013 1238   CL 101 10/25/2013 1238   CO2 25 01/10/2015 1131   CO2 29 09/04/2013 0937   BUN 17.6 01/10/2015 1131   BUN 13 10/25/2013 1238   CREATININE 1.9* 01/10/2015 1131   CREATININE 0.90 10/25/2013 1238      Component Value Date/Time   CALCIUM 8.5 01/10/2015 1131   CALCIUM 9.3 09/04/2013 0937   ALKPHOS 97 01/10/2015 1131   ALKPHOS 48 09/04/2013 0937   AST 12 01/10/2015 1131   AST 20 09/04/2013 0937   ALT 13 01/10/2015 1131   ALT 16 09/04/2013 0937   BILITOT 1.20 01/10/2015 1131   BILITOT 1.3* 09/04/2013 0937       RADIOGRAPHIC STUDIES: Ct Abdomen Pelvis Wo Contrast  12/12/2014   CLINICAL DATA:  69 year old male with history of multiple myeloma undergoing ongoing chemotherapy. 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 Additional history of bladder cancer status post surgical resection in 2015.  EXAM: CT ABDOMEN AND PELVIS WITHOUT CONTRAST  TECHNIQUE: Multidetector CT imaging of the abdomen and pelvis was performed following the standard protocol without IV contrast.  COMPARISON:  Chest CT 11/23/2014. PET-CT 10/27/2013. CT of the abdomen and pelvis 09/27/2013.  FINDINGS: Lower chest: Small amount of ground-glass  attenuation in the inferior aspect of the right lower lobe which may reflect some evolving postradiation changes. 10 mm left lower lobe pleural-based nodule (image 11 of series 4) unchanged compared to prior study from 09/27/2013, favored to be benign (prior PET-CT 10/27/2013 demonstrated no convincing hypermetabolism in this lesion).  Hepatobiliary: No discrete cystic or solid hepatic lesions are identified on today's noncontrast CT examination. The unenhanced appearance of the gallbladder is normal.  Pancreas: No pancreatic mass or peripancreatic inflammatory changes on today's noncontrast CT examination.  Spleen: Unremarkable.  Adrenals/Urinary Tract: Severe left hydronephrosis is noted. This appears to be related to a large soft  tissue mass causing obstruction in the region of the left renal pelvis. This mass is intimately associated with the left renal pelvis and proximal left ureter, and is estimated to measure approximately 4.8 x 4.3 x 7.4 cm (images 36 of series 2 and coronal image 54 of series 602). The origin of this lesion is uncertain on today's noncontrast CT examination, however, this is favored to be urothelial in origin, centered at the left ureteropelvic junction. Alternatively, this could simply represent an enlarged retroperitoneal lymph nodal mass, or other primary retroperitoneal lesion such as a sarcoma. This comes in contact with the undersurface of the left adrenal gland, but the left adrenal gland is otherwise normal in appearance. Right adrenal gland and right kidney are normal in appearance. Urinary bladder is remarkable for some circumferential wall thickening. There is also a a very large mass in the base of the urinary bladder which appears contiguous with the underlying prostate gland, favored once again to reflect profound median lobe hypertrophy of the prostate gland.  Stomach/Bowel: The unenhanced appearance of the stomach is normal. No pathologic dilatation of small bowel or colon. Normal appendix.  Vascular/Lymphatic: Atherosclerosis throughout the abdominal and pelvic vasculature, without definite aneurysm. Other than the previously described left upper retroperitoneal soft tissue mass (which could be nodal in origin) there are no additional enlarged lymph nodes identified in the abdomen or pelvis on today's noncontrast CT examination.  Reproductive: Prostate gland is markedly enlarged measuring approximately 7.1 x 7.4 cm. There appears to be severe irregular mass-like median lobe hypertrophy, with the median lobe of the prostate gland impressing upon the base of the urinary bladder and extending into the lumen, with this component of the gland measuring approximately 6.8 x 4.7 x 4.4 cm. Seminal vesicles are  unremarkable in appearance.  Other: No significant volume of ascites.  No pneumoperitoneum.  Musculoskeletal: 6.3 x 2.8 cm expansile lytic lesion with large soft tissue component in the lateral aspect of the seventh rib appears slightly smaller than prior examination 11/23/2014. Mixed lucent and sclerotic lesion in the more anterior aspect of the right seventh rib appears smaller than prior studies, compatible with a treated metastatic or myelomatous lesion. Several tiny lucent lesions in the left ilium (image 53 of series 2) are unchanged compared to 09/27/2013, favored to be benign.  IMPRESSION: 1. Interval development of a large left retroperitoneal mass which appears centered in the region of the left ureteropelvic junction, favored to be urothelial and origin, highly concerning for urothelial neoplasm. Alternatively, this could simply represent a lymphoma, extraosseous involvement from multiple myeloma, or other retroperitoneal neoplasm. This is causing obstruction of the left kidney with severe left hydronephrosis and perinephric stranding at this time. Urologic consultation is strongly recommended. 2. Massively enlarged prostate gland with severe median lobe hypertrophy. This has significantly increased compared to the prior examination. This is again associated with  circumferential bladder wall thickening, suggesting chronic bladder outlet obstruction. 3. Slight interval decrease in expansile lytic lesion in the lateral aspect of the right seventh rib, presumably indicative of a positive response to radiation therapy. 4. Unchanged 1 cm left lower lobe pulmonary nodule which is stable compared to prior studies dating back to 09/27/2013, favored to be benign. 5. Normal appendix. 6. Atherosclerosis. These results will be called to the ordering clinician or representative by the Radiologist Assistant, and communication documented in the PACS or zVision Dashboard.   Electronically Signed   By: Trudie Reed  M.D.   On: 12/12/2014 11:22   Ct Biopsy  12/26/2014   CLINICAL DATA:  Multiple myeloma. New left retroperitoneal mass with associated hydronephrosis. Expansile soft tissue mass along right seventh rib.  EXAM: CT GUIDED CORE BIOPSY OF LEFT RETROPERITONEAL MASS  ANESTHESIA/SEDATION: Intravenous Fentanyl and Versed were administered as conscious sedation during continuous cardiorespiratory monitoring by the radiology RN, with a total moderate sedation time of 5 minutes.  PROCEDURE: The procedure risks, benefits, and alternatives were explained to the patient. Questions regarding the procedure were encouraged and answered. The patient understands and consents to the procedure.  Patient placed prone. Select axial scans through the abdomen obtained in the left retroperitoneal mass localized. Mass measured smaller than on previous CT of 12/12/2014. After telephone consultation with Dr. Roselind Messier, decision made to proceed with biopsy. An appropriate skin entry site was determined and marked.  The operative field was prepped with Betadinein a sterile fashion, and a sterile drape was applied covering the operative field. A sterile gown and sterile gloves were used for the procedure. Local anesthesia was provided with 1% Lidocaine.  Under CT fluoroscopic guidance, a 17 gauge trocar needle was advanced to the margin of the lesion. Once needle tip position was confirmed, coaxial 18-gauge core biopsy samples were obtained, submitted in formalin to surgical pathology. The guide needle was removed. Postprocedure scans show no hemorrhage or other apparent complication. The patient tolerated the procedure well.  COMPLICATIONS: None immediate  FINDINGS: Left retroperitoneal mass was again localized, measured 3.3 cm maximum transverse diameter, previously 4.3 cm. Persistent left hydronephrosis.After telephone consultation with Dr. Roselind Messier, decision made to proceed with biopsy. CT-guided core biopsy samples were obtained.  IMPRESSION: 1.  Technically successful CT-guided core biopsy of left retroperitoneal mass.   Electronically Signed   By: Corlis Leak M.D.   On: 12/26/2014 12:54    ASSESSMENT AND PLAN: This is a very pleasant 69 years old African-American male with recently diagnosed multiple myeloma.  He is currently being treated with systemic chemotherapy with subcutaneous Velcade 1.3 MG/M2, Revlimid 25 mg by mouth daily for 21 days every 4 weeks in addition to weekly Decadron 40 mg orally. Revlimid started 11/23/2014. Thus far is tolerating his treatment relatively well. The patient was discussed with and also seen by Dr. Arbutus Ped. He will continue with his current treatment and follow-up in 3 weeks for another symptom management visit. We would also consider the patient for treatment with Zometa and few weeks after receiving dental clearance. He is encouraged to keep his appointment for the lung nodule biopsy. He was advised to call if he has any concerning symptoms in the interval.  The patient voices understanding of current disease status and treatment options and is in agreement with the current care plan.  All questions were answered. The patient knows to call the clinic with any problems, questions or concerns. We can certainly see the patient much sooner if necessary.  Carlton Adam, PA-C 01/10/2015  ADDENDUM: Hematology/Oncology Attending: I had a face to face encounter with the patient. I recommended his care plan. This is a very pleasant 69 years old African-American male diagnosed with multiple myeloma and currently undergoing systemic treatment with subcutaneous Velcade, Revlimid and Decadron. He is tolerating his treatment fairly well with no significant adverse effects. 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 I recommended for the patient to continue his current systemic chemotherapy with Velcade, Decadron  and Revlimid as a scheduled. We will also consider the patient for treatment with Zometa once we receive dental clearance. The patient would come back for follow-up visit in 3 weeks for reevaluation after repeating myeloma panel. The patient was advised to call immediately if he has any concerning symptoms in the interval.  Disclaimer: This note was dictated with voice recognition software. Similar sounding words can inadvertently be transcribed and may be missed upon review. Eilleen Kempf., MD 01/19/2015

## 2015-01-15 NOTE — Patient Instructions (Signed)
Continue labs and chemotherapy as scheduled Follow up in 3 weeks 

## 2015-01-16 ENCOUNTER — Other Ambulatory Visit: Payer: Self-pay | Admitting: Medical Oncology

## 2015-01-16 DIAGNOSIS — C9000 Multiple myeloma not having achieved remission: Secondary | ICD-10-CM

## 2015-01-16 MED ORDER — LENALIDOMIDE 25 MG PO CAPS: 25.0000 mg | ORAL_CAPSULE | Freq: Every day | ORAL | Status: DC

## 2015-01-17 ENCOUNTER — Other Ambulatory Visit (HOSPITAL_BASED_OUTPATIENT_CLINIC_OR_DEPARTMENT_OTHER): Payer: Medicare HMO

## 2015-01-17 ENCOUNTER — Ambulatory Visit (HOSPITAL_BASED_OUTPATIENT_CLINIC_OR_DEPARTMENT_OTHER): Payer: Medicare HMO

## 2015-01-17 VITALS — BP 155/85 | HR 64 | Temp 98.2°F | Resp 18

## 2015-01-17 DIAGNOSIS — Z5112 Encounter for antineoplastic immunotherapy: Secondary | ICD-10-CM

## 2015-01-17 DIAGNOSIS — C9000 Multiple myeloma not having achieved remission: Secondary | ICD-10-CM

## 2015-01-17 LAB — COMPREHENSIVE METABOLIC PANEL (CC13)
ALBUMIN: 3.5 g/dL (ref 3.5–5.0)
ALK PHOS: 76 U/L (ref 40–150)
ALT: 11 U/L (ref 0–55)
AST: 12 U/L (ref 5–34)
Anion Gap: 6 mEq/L (ref 3–11)
BUN: 23.7 mg/dL (ref 7.0–26.0)
CO2: 24 meq/L (ref 22–29)
Calcium: 8.5 mg/dL (ref 8.4–10.4)
Chloride: 112 mEq/L — ABNORMAL HIGH (ref 98–109)
Creatinine: 2.2 mg/dL — ABNORMAL HIGH (ref 0.7–1.3)
EGFR: 34 mL/min/{1.73_m2} — AB (ref >90)
GLUCOSE: 99 mg/dL (ref 70–140)
POTASSIUM: 4.2 meq/L (ref 3.5–5.1)
SODIUM: 142 meq/L (ref 136–145)
TOTAL PROTEIN: 6.4 g/dL (ref 6.4–8.3)
Total Bilirubin: 1.09 mg/dL (ref 0.20–1.20)

## 2015-01-17 LAB — CBC WITH DIFFERENTIAL/PLATELET
BASO%: 0.3 % (ref 0.0–2.0)
BASOS ABS: 0 10*3/uL (ref 0.0–0.1)
EOS%: 5.1 % (ref 0.0–7.0)
Eosinophils Absolute: 0.2 10*3/uL (ref 0.0–0.5)
HCT: 28.2 % — ABNORMAL LOW (ref 38.4–49.9)
HEMOGLOBIN: 9.3 g/dL — AB (ref 13.0–17.1)
LYMPH%: 15.2 % (ref 14.0–49.0)
MCH: 31.1 pg (ref 27.2–33.4)
MCHC: 33 g/dL (ref 32.0–36.0)
MCV: 94.3 fL (ref 79.3–98.0)
MONO#: 0.9 10*3/uL (ref 0.1–0.9)
MONO%: 26.5 % — AB (ref 0.0–14.0)
NEUT#: 1.8 10*3/uL (ref 1.5–6.5)
NEUT%: 52.9 % (ref 39.0–75.0)
Platelets: 76 10*3/uL — ABNORMAL LOW (ref 140–400)
RBC: 2.99 10*6/uL — ABNORMAL LOW (ref 4.20–5.82)
RDW: 17.7 % — AB (ref 11.0–14.6)
WBC: 3.4 10*3/uL — AB (ref 4.0–10.3)
lymph#: 0.5 10*3/uL — ABNORMAL LOW (ref 0.9–3.3)

## 2015-01-17 MED ORDER — BORTEZOMIB CHEMO SQ INJECTION 3.5 MG (2.5MG/ML)
1.3000 mg/m2 | Freq: Once | INTRAMUSCULAR | Status: AC
  Administered 2015-01-17: 2.75 mg via SUBCUTANEOUS
  Filled 2015-01-17: qty 2.75

## 2015-01-17 MED ORDER — ONDANSETRON HCL 8 MG PO TABS
ORAL_TABLET | ORAL | Status: AC
  Filled 2015-01-17: qty 1

## 2015-01-17 MED ORDER — ONDANSETRON HCL 8 MG PO TABS
8.0000 mg | ORAL_TABLET | Freq: Once | ORAL | Status: AC
  Administered 2015-01-17: 8 mg via ORAL

## 2015-01-17 NOTE — Progress Notes (Signed)
Labs reviewed. Platelets 76K, Creatinine is 1.9. OK to treat per Dr. [name].

## 2015-01-17 NOTE — Patient Instructions (Signed)
[facility] Discharge Instructions for Patients Receiving Chemotherapy  Today you received the following chemotherapy agents: Velcade.  To help prevent nausea and vomiting after your treatment, we encourage you to take your nausea medication: Compazine 10 mg every 6 hours as needed.   If you develop nausea and vomiting that is not controlled by your nausea medication, call the clinic.   BELOW ARE SYMPTOMS THAT SHOULD BE REPORTED IMMEDIATELY:  *FEVER GREATER THAN 100.5 F  *CHILLS WITH OR WITHOUT FEVER  NAUSEA AND VOMITING THAT IS NOT CONTROLLED WITH YOUR NAUSEA MEDICATION  *UNUSUAL SHORTNESS OF BREATH  *UNUSUAL BRUISING OR BLEEDING  TENDERNESS IN MOUTH AND THROAT WITH OR WITHOUT PRESENCE OF ULCERS  *URINARY PROBLEMS  *BOWEL PROBLEMS  UNUSUAL RASH Items with * indicate a potential emergency and should be followed up as soon as possible.  Feel free to call the clinic you have any questions or concerns. The clinic phone number is [phone].  Please show the CHEMO ALERT CARD at check-in to the Emergency Department and triage nurse.   

## 2015-01-18 ENCOUNTER — Encounter: Payer: Self-pay | Admitting: Internal Medicine

## 2015-01-18 NOTE — Progress Notes (Signed)
Per biologics revlimid and dexamethasone have been shipped via [employer]

## 2015-01-24 ENCOUNTER — Other Ambulatory Visit (HOSPITAL_BASED_OUTPATIENT_CLINIC_OR_DEPARTMENT_OTHER): Payer: Medicare HMO

## 2015-01-24 ENCOUNTER — Ambulatory Visit (HOSPITAL_BASED_OUTPATIENT_CLINIC_OR_DEPARTMENT_OTHER): Payer: Medicare HMO

## 2015-01-24 VITALS — BP 170/82 | HR 74 | Temp 98.2°F | Resp 20

## 2015-01-24 DIAGNOSIS — C9000 Multiple myeloma not having achieved remission: Secondary | ICD-10-CM

## 2015-01-24 DIAGNOSIS — Z5112 Encounter for antineoplastic immunotherapy: Secondary | ICD-10-CM

## 2015-01-24 LAB — CBC WITH DIFFERENTIAL/PLATELET
BASO%: 1.1 % (ref 0.0–2.0)
Basophils Absolute: 0 10*3/uL (ref 0.0–0.1)
EOS%: 0.5 % (ref 0.0–7.0)
Eosinophils Absolute: 0 10*3/uL (ref 0.0–0.5)
HEMATOCRIT: 27.8 % — AB (ref 38.4–49.9)
HGB: 9.2 g/dL — ABNORMAL LOW (ref 13.0–17.1)
LYMPH#: 0.3 10*3/uL — AB (ref 0.9–3.3)
LYMPH%: 12 % — ABNORMAL LOW (ref 14.0–49.0)
MCH: 31.2 pg (ref 27.2–33.4)
MCHC: 33 g/dL (ref 32.0–36.0)
MCV: 94.8 fL (ref 79.3–98.0)
MONO#: 1 10*3/uL — ABNORMAL HIGH (ref 0.1–0.9)
MONO%: 36.4 % — ABNORMAL HIGH (ref 0.0–14.0)
NEUT%: 50 % (ref 39.0–75.0)
NEUTROS ABS: 1.4 10*3/uL — AB (ref 1.5–6.5)
Platelets: 95 10*3/uL — ABNORMAL LOW (ref 140–400)
RBC: 2.93 10*6/uL — AB (ref 4.20–5.82)
RDW: 18.4 % — ABNORMAL HIGH (ref 11.0–14.6)
WBC: 2.7 10*3/uL — AB (ref 4.0–10.3)

## 2015-01-24 LAB — COMPREHENSIVE METABOLIC PANEL (CC13)
ALT: 16 U/L (ref 0–55)
AST: 14 U/L (ref 5–34)
Albumin: 3.3 g/dL — ABNORMAL LOW (ref 3.5–5.0)
Alkaline Phosphatase: 64 U/L (ref 40–150)
Anion Gap: 8 mEq/L (ref 3–11)
BUN: 36.7 mg/dL — AB (ref 7.0–26.0)
CHLORIDE: 111 meq/L — AB (ref 98–109)
CO2: 23 meq/L (ref 22–29)
CREATININE: 3.5 mg/dL — AB (ref 0.7–1.3)
Calcium: 8.4 mg/dL (ref 8.4–10.4)
EGFR: 20 mL/min/{1.73_m2} — ABNORMAL LOW (ref >90)
Glucose: 145 mg/dl — ABNORMAL HIGH (ref 70–140)
Potassium: 4 mEq/L (ref 3.5–5.1)
Sodium: 142 mEq/L (ref 136–145)
Total Bilirubin: 0.81 mg/dL (ref 0.20–1.20)
Total Protein: 6.5 g/dL (ref 6.4–8.3)

## 2015-01-24 MED ORDER — ONDANSETRON HCL 8 MG PO TABS
ORAL_TABLET | ORAL | Status: AC
Start: 2015-01-24 — End: 2015-01-24
  Filled 2015-01-24: qty 1

## 2015-01-24 MED ORDER — BORTEZOMIB CHEMO SQ INJECTION 3.5 MG (2.5MG/ML)
1.3000 mg/m2 | Freq: Once | INTRAMUSCULAR | Status: AC
  Administered 2015-01-24: 2.75 mg via SUBCUTANEOUS
  Filled 2015-01-24: qty 2.75

## 2015-01-24 MED ORDER — ONDANSETRON HCL 8 MG PO TABS
8.0000 mg | ORAL_TABLET | Freq: Once | ORAL | Status: AC
  Administered 2015-01-24: 8 mg via ORAL

## 2015-01-24 NOTE — Progress Notes (Signed)
Dr. [name] made aware of PLT 76k and creatinine 2.2. OK to treat, per MD.

## 2015-01-24 NOTE — Patient Instructions (Signed)
[facility] Discharge Instructions for Patients Receiving Chemotherapy  Today you received the following chemotherapy agents: Velcade.  To help prevent nausea and vomiting after your treatment, we encourage you to take your nausea medication: Compazine 10 mg every 6 hours as needed.   If you develop nausea and vomiting that is not controlled by your nausea medication, call the clinic.   BELOW ARE SYMPTOMS THAT SHOULD BE REPORTED IMMEDIATELY:  *FEVER GREATER THAN 100.5 F  *CHILLS WITH OR WITHOUT FEVER  NAUSEA AND VOMITING THAT IS NOT CONTROLLED WITH YOUR NAUSEA MEDICATION  *UNUSUAL SHORTNESS OF BREATH  *UNUSUAL BRUISING OR BLEEDING  TENDERNESS IN MOUTH AND THROAT WITH OR WITHOUT PRESENCE OF ULCERS  *URINARY PROBLEMS  *BOWEL PROBLEMS  UNUSUAL RASH Items with * indicate a potential emergency and should be followed up as soon as possible.  Feel free to call the clinic you have any questions or concerns. The clinic phone number is [phone].  Please show the CHEMO ALERT CARD at check-in to the Emergency Department and triage nurse.   

## 2015-01-29 ENCOUNTER — Encounter: Payer: Self-pay | Admitting: Internal Medicine

## 2015-01-29 ENCOUNTER — Ambulatory Visit (INDEPENDENT_AMBULATORY_CARE_PROVIDER_SITE_OTHER): Payer: Medicare HMO | Admitting: Internal Medicine

## 2015-01-29 VITALS — BP 162/100 | HR 71 | Temp 98.6°F | Resp 20 | Ht 68.0 in | Wt 213.0 lb

## 2015-01-29 DIAGNOSIS — Z23 Encounter for immunization: Secondary | ICD-10-CM | POA: Diagnosis not present

## 2015-01-29 DIAGNOSIS — C9000 Multiple myeloma not having achieved remission: Secondary | ICD-10-CM | POA: Diagnosis not present

## 2015-01-29 DIAGNOSIS — R600 Localized edema: Secondary | ICD-10-CM

## 2015-01-29 MED ORDER — FUROSEMIDE 20 MG PO TABS: 20.0000 mg | ORAL_TABLET | Freq: Every day | ORAL | Status: DC

## 2015-01-29 NOTE — Progress Notes (Signed)
Subjective:    Patient ID: Wesley [name], male    DOB: [birth year], 69 y.o.   MRN: [id number]  HPI 69 year old patient who has a history of kappa light  chain myeloma.  Chemotherapeutic regimen includes Decadron 40 mg once weekly.  For the past month she has had worsening pedal edema.  He is also on furosemide 20 mg every other day.  No salt restriction.  Otherwise feels well without pulmonary complaints.  No shortness of breath  Past Medical History  Diagnosis Date  . Pulmonary nodule, right     W/  CHEST WALL MASS--  SCHEDULED FOR BIOPSY W/ DR [name] ON FRIDAY 10-27-2013  . History of seizures as a child     AGE 69 OR 8--- NONE ISSUE SINCE  . History of GI bleed     SECONDARY TO ULCER --  DEC 2011  RESOLVED PER LAST EGD 07-05-2010  . Bladder tumor   . Hematuria   . Radiation 12/07/13-12/27/13    right anterior chest 37.5 gray  . Fracture of radius 09/29/14    left  . Cancer     multiple myeloma, bladder cancer 2015    Social History   Social History  . Marital Status: Married    Spouse Name: N/A  . Number of Children: 2  . Years of Education: N/A   Occupational History  . retired    Social History Main Topics  . Smoking status: Former Smoker -- 0.10 packs/day for 1 years    Types: Cigarettes    Quit date: 05/04/1969  . Smokeless tobacco: Never Used  . Alcohol Use: No  . Drug Use: No  . Sexual Activity: Not on file   Other Topics Concern  . Not on file   Social History Narrative    Past Surgical History  Procedure Laterality Date  . Excision benign left axilla tumor  1993  . Esophagogastroduodenoscopy  X5   LAST ONE 07-05-2010  . Transurethral resection of bladder tumor with gyrus (turbt-gyrus) N/A 10/25/2013    Procedure: TRANSURETHRAL RESECTION OF BLADDER TUMOR WITH GYRUS (TURBT-GYRUS);  Surgeon: Sharyn Creamer, MD;  Location: [facility];  Service: Urology;  Laterality: N/A;  . Cystoscopy with biopsy N/A 10/25/2013    Procedure: CYSTOSCOPY;   Surgeon: Sharyn Creamer, MD;  Location: [facility];  Service: Urology;  Laterality: N/A;  . Bone biopsy Right 11/14/13     right seventh rib lesion  . Orif radial fracture Left 10/22/2014    Procedure: Intramedullary nailing left radius fracture;  Surgeon: Milly Jakob, MD;  Location: [facility];  Service: Orthopedics;  Laterality: Left;  OPEN TREATMENT LEFT RADIUS FRACTURE    Family History  Problem Relation Age of Onset  . Heart disease Mother   . Cancer - Prostate Brother     No Known Allergies  Current Outpatient Prescriptions on File Prior to Visit  Medication Sig Dispense Refill  . acyclovir (ZOVIRAX) 400 MG tablet Take 1 tablet (400 mg total) by mouth 2 (two) times daily. 60 tablet 2  . Aspirin-Acetaminophen-Caffeine 500-325-65 MG PACK Take 1 packet by mouth every 4 (four) hours as needed (for pain).    Marland Kitchen dexamethasone (DECADRON) 4 MG tablet 10 tab po weekly with chemo 40 tablet 5  . furosemide (LASIX) 20 MG tablet Take 1 tablet (20 mg total) by mouth every other day. 20 tablet 0  . lenalidomide (REVLIMID) 25 MG capsule Take 1 capsule (25 mg total) by mouth daily.  Take 1 capsule (25 mg)\ po every day for 21 days every 4 weeks auth number [id number] 01/16/15. Adult male- 21 capsule 0  . potassium chloride SA (K-DUR,KLOR-CON) 20 MEQ tablet Take 1 tablet (20 meq) PO only on the days that you take Lasix. 20 tablet 0  . prochlorperazine (COMPAZINE) 10 MG tablet Take 1 tablet (10 mg total) by mouth every 6 (six) hours as needed for nausea or vomiting. 30 tablet 0  . warfarin (COUMADIN) 2 MG tablet Take 1 tablet (2 mg total) by mouth daily. 30 tablet 1   No current facility-administered medications on file prior to visit.    BP 162/100 mmHg  Pulse 71  Temp(Src) 98.6 F (37 C) (Oral)  Resp 20  Ht $R'5\' 8"'Ud$  (1.727 m)  Wt 213 lb (96.616 kg)  BMI 32.39 kg/m2  SpO2 98%      Review of Systems  Constitutional: Negative for fever, chills, appetite change  and fatigue.  HENT: Negative for congestion, dental problem, ear pain, hearing loss, sore throat, tinnitus, trouble swallowing and voice change.   Eyes: Negative for pain, discharge and visual disturbance.  Respiratory: Negative for cough, chest tightness, wheezing and stridor.   Cardiovascular: Positive for leg swelling. Negative for chest pain and palpitations.  Gastrointestinal: Negative for nausea, vomiting, abdominal pain, diarrhea, constipation, blood in stool and abdominal distention.  Genitourinary: Negative for urgency, hematuria, flank pain, discharge, difficulty urinating and genital sores.  Musculoskeletal: Negative for myalgias, back pain, joint swelling, arthralgias, gait problem and neck stiffness.  Skin: Negative for rash.  Neurological: Negative for dizziness, syncope, speech difficulty, weakness, numbness and headaches.  Hematological: Negative for adenopathy. Does not bruise/bleed easily.  Psychiatric/Behavioral: Negative for behavioral problems and dysphoric mood. The patient is not nervous/anxious.        Objective:   Physical Exam  Constitutional: He is oriented to person, place, and time. He appears well-developed.  Blood pressure 150/90  HENT:  Head: Normocephalic.  Right Ear: External ear normal.  Left Ear: External ear normal.  Eyes: Conjunctivae and EOM are normal.  Neck: Normal range of motion.  Cardiovascular: Normal rate and normal heart sounds.   Pulmonary/Chest: Breath sounds normal. No respiratory distress. He has no wheezes. He has no rales.  O2 saturation 98%  Abdominal: Bowel sounds are normal.  Musculoskeletal: Normal range of motion. He exhibits edema. He exhibits no tenderness.  Plus 2 edema lower legs, ankles and feet  Neurological: He is alert and oriented to person, place, and time.  Psychiatric: He has a normal mood and affect. His behavior is normal.          Assessment & Plan:   Lower extremity edema.  Will place on a restricted  salt diet.  Increase furosemide to 40 mg daily for 3 days, then 20 mg every day Rule out hypertension.  Home blood pressure monitoring.  Encouraged.  Recheck 4 weeks.  Hopefully will improve with volume depletion

## 2015-01-29 NOTE — Patient Instructions (Signed)
Furosemide 20 mg  2 tablets daily for 3 days and then 1 tablet every morning  Limit your sodium (Salt) intake  Please check your blood pressure on a regular basis.  If it is consistently greater than 150/90, please make an office appointment.  Return in one month for follow-up  Low-Sodium Eating Plan Sodium raises blood pressure and causes water to be held in the body. Getting less sodium from food will help lower your blood pressure, reduce any swelling, and protect your heart, liver, and kidneys. We get sodium by adding salt (sodium chloride) to food. Most of our sodium comes from canned, boxed, and frozen foods. Restaurant foods, fast foods, and pizza are also very high in sodium. Even if you take medicine to lower your blood pressure or to reduce fluid in your body, getting less sodium from your food is important. WHAT IS MY PLAN? Most people should limit their sodium intake to 2,300 mg a day. Your health care provider recommends that you limit your sodium intake to __________ a day.  WHAT DO I NEED TO KNOW ABOUT THIS EATING PLAN? For the low-sodium eating plan, you will follow these general guidelines:  Choose foods with a % Daily Value for sodium of less than 5% (as listed on the food label).   Use salt-free seasonings or herbs instead of table salt or sea salt.   Check with your health care provider or pharmacist before using salt substitutes.   Eat fresh foods.  Eat more vegetables and fruits.  Limit canned vegetables. If you do use them, rinse them well to decrease the sodium.   Limit cheese to 1 oz (28 g) per day.   Eat lower-sodium products, often labeled as "lower sodium" or "no salt added."  Avoid foods that contain monosodium glutamate (MSG). MSG is sometimes added to [place] food and some canned foods.  Check food labels (Nutrition Facts labels) on foods to learn how much sodium is in one serving.  Eat more home-cooked food and less restaurant, buffet, and  fast food.  When eating at a restaurant, ask that your food be prepared with less salt or none, if possible.  HOW DO I READ FOOD LABELS FOR SODIUM INFORMATION? The Nutrition Facts label lists the amount of sodium in one serving of the food. If you eat more than one serving, you must multiply the listed amount of sodium by the number of servings. Food labels may also identify foods as:  Sodium free--Less than 5 mg in a serving.  Very low sodium--35 mg or less in a serving.  Low sodium--140 mg or less in a serving.  Light in sodium--50% less sodium in a serving. For example, if a food that usually has 300 mg of sodium is changed to become light in sodium, it will have 150 mg of sodium.  Reduced sodium--25% less sodium in a serving. For example, if a food that usually has 400 mg of sodium is changed to reduced sodium, it will have 300 mg of sodium. WHAT FOODS CAN I EAT? Grains Low-sodium cereals, including oats, puffed wheat and rice, and shredded wheat cereals. Low-sodium crackers. Unsalted rice and pasta. Lower-sodium bread.  Vegetables Frozen or fresh vegetables. Low-sodium or reduced-sodium canned vegetables. Low-sodium or reduced-sodium tomato sauce and paste. Low-sodium or reduced-sodium tomato and vegetable juices.  Fruits Fresh, frozen, and canned fruit. Fruit juice.  Meat and Other Protein Products Low-sodium canned tuna and salmon. Fresh or frozen meat, poultry, seafood, and fish. Lamb. Unsalted nuts. Dried  beans, peas, and lentils without added salt. Unsalted canned beans. Homemade soups without salt. Eggs.  Dairy Milk. Soy milk. Ricotta cheese. Low-sodium or reduced-sodium cheeses. Yogurt.  Condiments Fresh and dried herbs and spices. Salt-free seasonings. Onion and garlic powders. Low-sodium varieties of mustard and ketchup. Lemon juice.  Fats and Oils Reduced-sodium salad dressings. Unsalted butter.  Other Unsalted popcorn and pretzels.  The items listed above  may not be a complete list of recommended foods or beverages. Contact your dietitian for more options. WHAT FOODS ARE NOT RECOMMENDED? Grains Instant hot cereals. Bread stuffing, pancake, and biscuit mixes. Croutons. Seasoned rice or pasta mixes. Noodle soup cups. Boxed or frozen macaroni and cheese. Self-rising flour. Regular salted crackers. Vegetables Regular canned vegetables. Regular canned tomato sauce and paste. Regular tomato and vegetable juices. Frozen vegetables in sauces. Salted french fries. Olives. Angie Fava. Relishes. Sauerkraut. Salsa. Meat and Other Protein Products Salted, canned, smoked, spiced, or pickled meats, seafood, or fish. Bacon, ham, sausage, hot dogs, corned beef, chipped beef, and packaged luncheon meats. Salt pork. Jerky. Pickled herring. Anchovies, regular canned tuna, and sardines. Salted nuts. Dairy Processed cheese and cheese spreads. Cheese curds. Blue cheese and cottage cheese. Buttermilk.  Condiments Onion and garlic salt, seasoned salt, table salt, and sea salt. Canned and packaged gravies. Worcestershire sauce. Tartar sauce. Barbecue sauce. Teriyaki sauce. Soy sauce, including reduced sodium. Steak sauce. Fish sauce. Oyster sauce. Cocktail sauce. Horseradish. Regular ketchup and mustard. Meat flavorings and tenderizers. Bouillon cubes. Hot sauce. Tabasco sauce. Marinades. Taco seasonings. Relishes. Fats and Oils Regular salad dressings. Salted butter. Margarine. Ghee. Bacon fat.  Other Potato and tortilla chips. Corn chips and puffs. Salted popcorn and pretzels. Canned or dried soups. Pizza. Frozen entrees and pot pies.  The items listed above may not be a complete list of foods and beverages to avoid. Contact your dietitian for more information. Document Released: 10/10/2001 Document Revised: 04/25/2013 Document Reviewed: 02/22/2013 [facility] Patient Information 2015 [place], [place]. This information is not intended to replace advice given to you by  your health care provider. Make sure you discuss any questions you have with your health care provider.

## 2015-01-29 NOTE — Progress Notes (Signed)
Pre visit review using our clinic review tool, if applicable. No additional management support is needed unless otherwise documented below in the visit note. 

## 2015-01-31 ENCOUNTER — Ambulatory Visit (HOSPITAL_BASED_OUTPATIENT_CLINIC_OR_DEPARTMENT_OTHER): Payer: Medicare HMO | Admitting: Nurse Practitioner

## 2015-01-31 ENCOUNTER — Other Ambulatory Visit (HOSPITAL_BASED_OUTPATIENT_CLINIC_OR_DEPARTMENT_OTHER): Payer: Medicare HMO

## 2015-01-31 ENCOUNTER — Telehealth: Payer: Self-pay | Admitting: Internal Medicine

## 2015-01-31 ENCOUNTER — Ambulatory Visit: Payer: Medicare HMO

## 2015-01-31 ENCOUNTER — Other Ambulatory Visit: Payer: Medicare HMO

## 2015-01-31 ENCOUNTER — Other Ambulatory Visit: Payer: Self-pay | Admitting: *Deleted

## 2015-01-31 ENCOUNTER — Telehealth: Payer: Self-pay | Admitting: Nurse Practitioner

## 2015-01-31 VITALS — BP 156/88 | HR 73 | Temp 98.0°F | Resp 18 | Ht 68.0 in | Wt 208.1 lb

## 2015-01-31 DIAGNOSIS — N179 Acute kidney failure, unspecified: Secondary | ICD-10-CM | POA: Diagnosis not present

## 2015-01-31 DIAGNOSIS — C9000 Multiple myeloma not having achieved remission: Secondary | ICD-10-CM | POA: Diagnosis not present

## 2015-01-31 LAB — COMPREHENSIVE METABOLIC PANEL (CC13)
ALBUMIN: 3.6 g/dL (ref 3.5–5.0)
ALK PHOS: 60 U/L (ref 40–150)
ALT: 10 U/L (ref 0–55)
ANION GAP: 9 meq/L (ref 3–11)
AST: 9 U/L (ref 5–34)
BILIRUBIN TOTAL: 1.01 mg/dL (ref 0.20–1.20)
BUN: 39.1 mg/dL — ABNORMAL HIGH (ref 7.0–26.0)
CALCIUM: 8.6 mg/dL (ref 8.4–10.4)
CO2: 22 mEq/L (ref 22–29)
Chloride: 108 mEq/L (ref 98–109)
Creatinine: 4.3 mg/dL (ref 0.7–1.3)
EGFR: 16 mL/min/{1.73_m2} — AB (ref >90)
GLUCOSE: 120 mg/dL (ref 70–140)
POTASSIUM: 4.6 meq/L (ref 3.5–5.1)
SODIUM: 139 meq/L (ref 136–145)
TOTAL PROTEIN: 6.7 g/dL (ref 6.4–8.3)

## 2015-01-31 LAB — CBC WITH DIFFERENTIAL/PLATELET
BASO%: 0.9 % (ref 0.0–2.0)
BASOS ABS: 0 10*3/uL (ref 0.0–0.1)
EOS ABS: 0.1 10*3/uL (ref 0.0–0.5)
EOS%: 3.5 % (ref 0.0–7.0)
HEMATOCRIT: 27.6 % — AB (ref 38.4–49.9)
HEMOGLOBIN: 9.1 g/dL — AB (ref 13.0–17.1)
LYMPH%: 8.5 % — ABNORMAL LOW (ref 14.0–49.0)
MCH: 31.1 pg (ref 27.2–33.4)
MCHC: 32.9 g/dL (ref 32.0–36.0)
MCV: 94.4 fL (ref 79.3–98.0)
MONO#: 0.3 10*3/uL (ref 0.1–0.9)
MONO%: 11.7 % (ref 0.0–14.0)
NEUT%: 75.4 % — ABNORMAL HIGH (ref 39.0–75.0)
NEUTROS ABS: 1.9 10*3/uL (ref 1.5–6.5)
PLATELETS: 101 10*3/uL — AB (ref 140–400)
RBC: 2.93 10*6/uL — ABNORMAL LOW (ref 4.20–5.82)
RDW: 18.7 % — AB (ref 11.0–14.6)
WBC: 2.5 10*3/uL — AB (ref 4.0–10.3)
lymph#: 0.2 10*3/uL — ABNORMAL LOW (ref 0.9–3.3)

## 2015-01-31 LAB — LACTATE DEHYDROGENASE (CC13): LDH: 261 U/L — AB (ref 125–245)

## 2015-01-31 MED ORDER — WARFARIN SODIUM 2 MG PO TABS: 2.0000 mg | ORAL_TABLET | Freq: Every day | ORAL | Status: DC

## 2015-01-31 NOTE — Telephone Encounter (Signed)
Reviewed Coumadin 2mg  daily refill request with MD- pt on prophylacticly,-  Refill e-scribed to pt pharmacy

## 2015-01-31 NOTE — Telephone Encounter (Signed)
Pt confirmed labs/ov per 09/29 POF, gave pt AVS and Calendar.Cherylann Banas, sent msg to add chemo

## 2015-01-31 NOTE — Telephone Encounter (Signed)
Pt wife call to say that Dr [name] took pt off of the fluid pills and is making him an appt to see the kidney doctor

## 2015-01-31 NOTE — Progress Notes (Addendum)
[facility] OFFICE PROGRESS NOTE   DIAGNOSIS: Multiple myeloma diagnosed in May 2016  PRIOR THERAPY: Radiation to the tumor mass at the right anterior chest under the care of Dr. [name] completed 12/07/2013 through 12/27/2013.  CURRENT THERAPY: Systemic chemotherapy with weekly subcutaneous Velcade 1.3 MG/M2, Revlimid 25 mg by mouth daily for 21 days every 4 weeks in addition to weekly Decadron 40 mg orally. Revlimid started on 11/23/2014   INTERVAL HISTORY:   Wesley [name] returns as scheduled. He has completed 10 weeks of Velcade/dexamethasone. He began the most recent cycle of Revlimid "last week". He denies nausea/vomiting. No mouth sores. No diarrhea. No numbness or tingling in his hands or feet. He reports a good appetite. No fever. No shortness of breath. He has anoccasional cough which he attributes to a cold. He has noted bilateral leg swelling for the past 4-5 weeks. He notes intermittent bilateral foot pain which he thinks is related to the swelling.  Objective:  Vital signs in last 24 hours:  Blood pressure 156/88, pulse 73, temperature 98 F (36.7 C), temperature source Oral, resp. rate 18, height $RemoveBe'5\' 8"'LXYaFVUNW$  (1.727 m), weight 208 lb 1.6 oz (94.394 kg), SpO2 100 %.    HEENT: No thrush or ulcers. Lymphatics: No palpable cervical or supra-clavicular lymph nodes. Resp: Lungs clear bilaterally. Cardio: Regular rate and rhythm. GI: Abdomen soft and nontender. No hepatomegaly. Vascular: Pitting edema bilateral pretibial region/lower leg/ankle.    Lab Results:  Lab Results  Component Value Date   WBC 2.5* 01/31/2015   HGB 9.1* 01/31/2015   HCT 27.6* 01/31/2015   MCV 94.4 01/31/2015   PLT 101* 01/31/2015   NEUTROABS 1.9 01/31/2015    Imaging:  No results found.  Medications: I have reviewed the patient's current medications.  Assessment/Plan: 1. Multiple myeloma currently on active treatment with RVD. He has completed 10 cycles of Velcade/dexamethasone.  Most recent cycle of Revlimid was started "last week". 2. Renal failure.   Disposition: Wesley [name] has completed 10 cycles of Velcade/dexamethasone and 2 cycles of Revlimid. He is currently completing cycle 3 Revlimid. Repeat myeloma labs drawn earlier today, currently pending.  He has had a progressive rise in the creatinine. We are placing his treatment on hold. We also instructed him to place Lasix on hold. Dr. [name] recommends a referral to nephrology as well.  Wesley [name] will return for repeat labs in one week and a follow-up visit in 2 weeks. He will contact the office in the interim with any problems.  Patient seen with Dr. [name].      Ned Card ANP/GNP-BC   01/31/2015  11:00 AM  ADDENDUM: Hematology/Oncology Attending: I had a face to face encounter with the patient. I recommended his care plan. This is a very pleasant 69 years old African-American male with recently diagnosed multiple myeloma and he is currently undergoing systemic chemotherapy with weekly subcutaneous Velcade, Revlimid and Decadron started in July 2016. The patient has been tolerating his treatment fairly well with no significant adverse effects. His serum creatinine has been elevated over the last few weeks and it was getting worse today. He has been on treatment with Lasix by his primary care physician. I recommended for the patient to hold his treatment for now. We will repeat myeloma panel for evaluation of his disease. For the insufficiency, I will refer the patient to nephrology for evaluation of his condition. He was also advised to hold his Lasix for now. I will see the patient back for follow-up visit  in 2 weeks for reevaluation and discussion of his myeloma panel before resuming his systemic therapy. The patient was advised to call immediately if he has any concerning symptoms in the interval.  Disclaimer: This note was dictated with voice recognition software. Similar sounding words can  inadvertently be transcribed and may not be corrected upon review. Eilleen Kempf., MD 02/01/2015

## 2015-01-31 NOTE — Telephone Encounter (Signed)
FYI

## 2015-02-04 LAB — IGG, IGA, IGM
IGA: 29 mg/dL — AB (ref 68–379)
IGM, SERUM: 30 mg/dL — AB (ref 41–251)
IgG (Immunoglobin G), Serum: 916 mg/dL (ref 650–1600)

## 2015-02-04 LAB — KAPPA/LAMBDA LIGHT CHAINS
KAPPA LAMBDA RATIO: 13.45 — AB (ref 0.26–1.65)
Kappa free light chain: 23.8 mg/dL — ABNORMAL HIGH (ref 0.33–1.94)
Lambda Free Lght Chn: 1.77 mg/dL (ref 0.57–2.63)

## 2015-02-04 LAB — BETA 2 MICROGLOBULIN, SERUM: Beta-2 Microglobulin: 7.86 mg/L — ABNORMAL HIGH (ref <=2.51)

## 2015-02-07 ENCOUNTER — Other Ambulatory Visit (HOSPITAL_BASED_OUTPATIENT_CLINIC_OR_DEPARTMENT_OTHER): Payer: Medicare HMO

## 2015-02-07 DIAGNOSIS — N179 Acute kidney failure, unspecified: Secondary | ICD-10-CM

## 2015-02-07 DIAGNOSIS — C9000 Multiple myeloma not having achieved remission: Secondary | ICD-10-CM | POA: Diagnosis not present

## 2015-02-07 LAB — COMPREHENSIVE METABOLIC PANEL (CC13)
ALT: <9 U/L (ref 0–55)
AST: 9 U/L (ref 5–34)
Albumin: 3.6 g/dL (ref 3.5–5.0)
Alkaline Phosphatase: 52 U/L (ref 40–150)
Anion Gap: 11 mEq/L (ref 3–11)
BILIRUBIN TOTAL: 0.85 mg/dL (ref 0.20–1.20)
BUN: 50.7 mg/dL — ABNORMAL HIGH (ref 7.0–26.0)
CO2: 18 meq/L — AB (ref 22–29)
Calcium: 7.9 mg/dL — ABNORMAL LOW (ref 8.4–10.4)
Chloride: 107 mEq/L (ref 98–109)
Creatinine: 6.6 mg/dL (ref 0.7–1.3)
EGFR: 9 mL/min/{1.73_m2} — AB (ref >90)
GLUCOSE: 115 mg/dL (ref 70–140)
POTASSIUM: 4.7 meq/L (ref 3.5–5.1)
SODIUM: 137 meq/L (ref 136–145)
TOTAL PROTEIN: 6.7 g/dL (ref 6.4–8.3)

## 2015-02-07 LAB — CBC WITH DIFFERENTIAL/PLATELET
BASO%: 2.2 % — ABNORMAL HIGH (ref 0.0–2.0)
Basophils Absolute: 0.1 10e3/uL (ref 0.0–0.1)
EOS%: 5.8 % (ref 0.0–7.0)
Eosinophils Absolute: 0.2 10e3/uL (ref 0.0–0.5)
HCT: 24.2 % — ABNORMAL LOW (ref 38.4–49.9)
HGB: 8.4 g/dL — ABNORMAL LOW (ref 13.0–17.1)
LYMPH%: 19.5 % (ref 14.0–49.0)
MCH: 32.3 pg (ref 27.2–33.4)
MCHC: 34.7 g/dL (ref 32.0–36.0)
MCV: 93.1 fL (ref 79.3–98.0)
MONO#: 0.5 10e3/uL (ref 0.1–0.9)
MONO%: 14.2 % — ABNORMAL HIGH (ref 0.0–14.0)
NEUT#: 2.1 10e3/uL (ref 1.5–6.5)
NEUT%: 58.3 % (ref 39.0–75.0)
Platelets: 92 10e3/uL — ABNORMAL LOW (ref 140–400)
RBC: 2.6 10e6/uL — ABNORMAL LOW (ref 4.20–5.82)
RDW: 16.3 % — ABNORMAL HIGH (ref 11.0–14.6)
WBC: 3.6 10e3/uL — ABNORMAL LOW (ref 4.0–10.3)
lymph#: 0.7 10e3/uL — ABNORMAL LOW (ref 0.9–3.3)

## 2015-02-13 ENCOUNTER — Encounter: Payer: Self-pay | Admitting: Internal Medicine

## 2015-02-13 NOTE — Progress Notes (Signed)
Per kathy [phone] at [facility]-the patient advised them of hold on revlimid. They have put on hold and said if he starts again to just them know and as long has pa has not expired--no new script will be needed

## 2015-02-14 ENCOUNTER — Telehealth: Payer: Self-pay | Admitting: Internal Medicine

## 2015-02-14 ENCOUNTER — Ambulatory Visit: Payer: Medicare HMO

## 2015-02-14 ENCOUNTER — Other Ambulatory Visit (HOSPITAL_BASED_OUTPATIENT_CLINIC_OR_DEPARTMENT_OTHER): Payer: Medicare HMO

## 2015-02-14 ENCOUNTER — Encounter: Payer: Self-pay | Admitting: *Deleted

## 2015-02-14 ENCOUNTER — Ambulatory Visit (HOSPITAL_COMMUNITY)
Admission: RE | Admit: 2015-02-14 | Discharge: 2015-02-14 | Disposition: A | Discharge status: Home / Self Care | Payer: Medicare HMO | Source: Ambulatory Visit | Attending: Internal Medicine | Admitting: Internal Medicine

## 2015-02-14 DIAGNOSIS — D6489 Other specified anemias: Secondary | ICD-10-CM | POA: Insufficient documentation

## 2015-02-14 DIAGNOSIS — C9000 Multiple myeloma not having achieved remission: Secondary | ICD-10-CM

## 2015-02-14 DIAGNOSIS — N179 Acute kidney failure, unspecified: Secondary | ICD-10-CM | POA: Diagnosis not present

## 2015-02-14 LAB — COMPREHENSIVE METABOLIC PANEL (CC13)
ALT: <9 U/L (ref 0–55)
ANION GAP: 10 meq/L (ref 3–11)
AST: 10 U/L (ref 5–34)
Albumin: 3.5 g/dL (ref 3.5–5.0)
Alkaline Phosphatase: 56 U/L (ref 40–150)
BUN: 53.1 mg/dL — ABNORMAL HIGH (ref 7.0–26.0)
CALCIUM: 8.3 mg/dL — AB (ref 8.4–10.4)
CHLORIDE: 112 meq/L — AB (ref 98–109)
CO2: 16 mEq/L — ABNORMAL LOW (ref 22–29)
Creatinine: 5.4 mg/dL (ref 0.7–1.3)
EGFR: 12 mL/min/{1.73_m2} — AB (ref >90)
Glucose: 97 mg/dl (ref 70–140)
POTASSIUM: 5.1 meq/L (ref 3.5–5.1)
Sodium: 138 mEq/L (ref 136–145)
Total Bilirubin: 0.71 mg/dL (ref 0.20–1.20)
Total Protein: 6.7 g/dL (ref 6.4–8.3)

## 2015-02-14 LAB — CBC WITH DIFFERENTIAL/PLATELET
BASO%: 3.1 % — ABNORMAL HIGH (ref 0.0–2.0)
BASOS ABS: 0.1 10*3/uL (ref 0.0–0.1)
EOS%: 14.9 % — AB (ref 0.0–7.0)
Eosinophils Absolute: 0.4 10*3/uL (ref 0.0–0.5)
HEMATOCRIT: 21.8 % — AB (ref 38.4–49.9)
HGB: 7.6 g/dL — ABNORMAL LOW (ref 13.0–17.1)
LYMPH#: 0.8 10*3/uL — AB (ref 0.9–3.3)
LYMPH%: 31 % (ref 14.0–49.0)
MCH: 32.9 pg (ref 27.2–33.4)
MCHC: 34.9 g/dL (ref 32.0–36.0)
MCV: 94.4 fL (ref 79.3–98.0)
MONO#: 0.3 10*3/uL (ref 0.1–0.9)
MONO%: 12.6 % (ref 0.0–14.0)
NEUT#: 1 10*3/uL — ABNORMAL LOW (ref 1.5–6.5)
NEUT%: 38.4 % — AB (ref 39.0–75.0)
PLATELETS: 100 10*3/uL — AB (ref 140–400)
RBC: 2.31 10*6/uL — ABNORMAL LOW (ref 4.20–5.82)
RDW: 16.4 % — ABNORMAL HIGH (ref 11.0–14.6)
WBC: 2.6 10*3/uL — ABNORMAL LOW (ref 4.0–10.3)

## 2015-02-14 LAB — ABO/RH: ABO/RH(D): B POS

## 2015-02-14 LAB — HOLD TUBE, BLOOD BANK

## 2015-02-14 LAB — PREPARE RBC (CROSSMATCH)

## 2015-02-14 NOTE — Progress Notes (Signed)
Hold velcade today due to [place] 1.0, Hgb 7.6 and Creatinine 5.4. Patient to receive 2 units of blood in Sickle Cell Unit tomorrow. Patient sent back to lab at [facility] to have type and screen drawn. Patient verbalized understanding he is to have 2 units of PRBC's tomorrow at sickle cell unit.

## 2015-02-14 NOTE — Progress Notes (Signed)
Per MD, labs reviewed Hold velcade 10/13, pt to get 2 units. POF to scheduling

## 2015-02-14 NOTE — Telephone Encounter (Signed)
lvm for pt regarding to SAT appt.Marland KitchenMarland KitchenMarland Kitchen

## 2015-02-16 ENCOUNTER — Ambulatory Visit (HOSPITAL_BASED_OUTPATIENT_CLINIC_OR_DEPARTMENT_OTHER): Payer: Medicare HMO

## 2015-02-16 VITALS — BP 156/97 | HR 76 | Temp 98.7°F | Resp 18

## 2015-02-16 DIAGNOSIS — D6489 Other specified anemias: Secondary | ICD-10-CM

## 2015-02-16 MED ORDER — SODIUM CHLORIDE 0.9 % IV SOLN
250.0000 mL | Freq: Once | INTRAVENOUS | Status: AC
  Administered 2015-02-16: 250 mL via INTRAVENOUS

## 2015-02-16 MED ORDER — DIPHENHYDRAMINE HCL 25 MG PO CAPS
ORAL_CAPSULE | ORAL | Status: AC
  Filled 2015-02-16: qty 1

## 2015-02-16 MED ORDER — ACETAMINOPHEN 325 MG PO TABS
ORAL_TABLET | ORAL | Status: AC
  Filled 2015-02-16: qty 2

## 2015-02-16 MED ORDER — ACETAMINOPHEN 325 MG PO TABS
650.0000 mg | ORAL_TABLET | Freq: Once | ORAL | Status: AC
  Administered 2015-02-16: 650 mg via ORAL

## 2015-02-16 MED ORDER — DIPHENHYDRAMINE HCL 25 MG PO CAPS
25.0000 mg | ORAL_CAPSULE | Freq: Once | ORAL | Status: AC
  Administered 2015-02-16: 25 mg via ORAL

## 2015-02-16 NOTE — Patient Instructions (Signed)

## 2015-02-17 ENCOUNTER — Other Ambulatory Visit: Payer: Self-pay

## 2015-02-17 ENCOUNTER — Inpatient Hospital Stay (HOSPITAL_COMMUNITY)
Admission: EM | Admit: 2015-02-17 | Discharge: 2015-02-23 | DRG: 305 | Disposition: A | Discharge status: Home / Self Care | Payer: Medicare HMO | Attending: Internal Medicine | Admitting: Internal Medicine

## 2015-02-17 ENCOUNTER — Emergency Department (HOSPITAL_COMMUNITY): Payer: Medicare HMO

## 2015-02-17 ENCOUNTER — Encounter (HOSPITAL_COMMUNITY): Payer: Self-pay

## 2015-02-17 DIAGNOSIS — I10 Essential (primary) hypertension: Secondary | ICD-10-CM | POA: Diagnosis present

## 2015-02-17 DIAGNOSIS — R1900 Intra-abdominal and pelvic swelling, mass and lump, unspecified site: Secondary | ICD-10-CM | POA: Diagnosis present

## 2015-02-17 DIAGNOSIS — E872 Acidosis, unspecified: Secondary | ICD-10-CM | POA: Diagnosis present

## 2015-02-17 DIAGNOSIS — D638 Anemia in other chronic diseases classified elsewhere: Secondary | ICD-10-CM | POA: Diagnosis present

## 2015-02-17 DIAGNOSIS — I160 Hypertensive urgency: Secondary | ICD-10-CM | POA: Diagnosis present

## 2015-02-17 DIAGNOSIS — I120 Hypertensive chronic kidney disease with stage 5 chronic kidney disease or end stage renal disease: Secondary | ICD-10-CM | POA: Diagnosis present

## 2015-02-17 DIAGNOSIS — N183 Chronic kidney disease, stage 3 unspecified: Secondary | ICD-10-CM | POA: Diagnosis present

## 2015-02-17 DIAGNOSIS — N179 Acute kidney failure, unspecified: Secondary | ICD-10-CM | POA: Diagnosis present

## 2015-02-17 DIAGNOSIS — D696 Thrombocytopenia, unspecified: Secondary | ICD-10-CM | POA: Diagnosis present

## 2015-02-17 DIAGNOSIS — N19 Unspecified kidney failure: Secondary | ICD-10-CM | POA: Diagnosis not present

## 2015-02-17 DIAGNOSIS — N08 Glomerular disorders in diseases classified elsewhere: Secondary | ICD-10-CM

## 2015-02-17 DIAGNOSIS — N3081 Other cystitis with hematuria: Secondary | ICD-10-CM | POA: Diagnosis present

## 2015-02-17 DIAGNOSIS — R338 Other retention of urine: Secondary | ICD-10-CM | POA: Diagnosis present

## 2015-02-17 DIAGNOSIS — N185 Chronic kidney disease, stage 5: Secondary | ICD-10-CM | POA: Diagnosis present

## 2015-02-17 DIAGNOSIS — T451X5A Adverse effect of antineoplastic and immunosuppressive drugs, initial encounter: Secondary | ICD-10-CM | POA: Diagnosis present

## 2015-02-17 DIAGNOSIS — N401 Enlarged prostate with lower urinary tract symptoms: Secondary | ICD-10-CM | POA: Diagnosis present

## 2015-02-17 DIAGNOSIS — D72819 Decreased white blood cell count, unspecified: Secondary | ICD-10-CM | POA: Diagnosis present

## 2015-02-17 DIAGNOSIS — E875 Hyperkalemia: Secondary | ICD-10-CM | POA: Diagnosis present

## 2015-02-17 DIAGNOSIS — N1330 Unspecified hydronephrosis: Secondary | ICD-10-CM | POA: Diagnosis present

## 2015-02-17 DIAGNOSIS — D701 Agranulocytosis secondary to cancer chemotherapy: Secondary | ICD-10-CM | POA: Diagnosis present

## 2015-02-17 DIAGNOSIS — C9000 Multiple myeloma not having achieved remission: Secondary | ICD-10-CM | POA: Diagnosis present

## 2015-02-17 DIAGNOSIS — Z8551 Personal history of malignant neoplasm of bladder: Secondary | ICD-10-CM

## 2015-02-17 DIAGNOSIS — Z87891 Personal history of nicotine dependence: Secondary | ICD-10-CM

## 2015-02-17 DIAGNOSIS — Z7901 Long term (current) use of anticoagulants: Secondary | ICD-10-CM

## 2015-02-17 DIAGNOSIS — N289 Disorder of kidney and ureter, unspecified: Secondary | ICD-10-CM | POA: Diagnosis present

## 2015-02-17 DIAGNOSIS — D6481 Anemia due to antineoplastic chemotherapy: Secondary | ICD-10-CM | POA: Diagnosis present

## 2015-02-17 DIAGNOSIS — N390 Urinary tract infection, site not specified: Secondary | ICD-10-CM | POA: Diagnosis not present

## 2015-02-17 DIAGNOSIS — A4152 Sepsis due to Pseudomonas: Secondary | ICD-10-CM | POA: Diagnosis not present

## 2015-02-17 DIAGNOSIS — A419 Sepsis, unspecified organism: Secondary | ICD-10-CM | POA: Diagnosis not present

## 2015-02-17 LAB — URINALYSIS, ROUTINE W REFLEX MICROSCOPIC
Bilirubin Urine: NEGATIVE
GLUCOSE, UA: NEGATIVE mg/dL
HGB URINE DIPSTICK: NEGATIVE
KETONES UR: NEGATIVE mg/dL
Leukocytes, UA: NEGATIVE
Nitrite: NEGATIVE
PROTEIN: NEGATIVE mg/dL
Specific Gravity, Urine: 1.005 (ref 1.005–1.030)
Urobilinogen, UA: 0.2 mg/dL (ref 0.0–1.0)
pH: 6 (ref 5.0–8.0)

## 2015-02-17 LAB — TYPE AND SCREEN
ABO/RH(D): B POS
Antibody Screen: NEGATIVE
UNIT DIVISION: 0
Unit division: 0

## 2015-02-17 LAB — I-STAT CHEM 8, ED
BUN: 45 mg/dL — AB (ref 6–20)
CHLORIDE: 108 mmol/L (ref 101–111)
CREATININE: 5.2 mg/dL — AB (ref 0.61–1.24)
Calcium, Ion: 1.22 mmol/L (ref 1.13–1.30)
Glucose, Bld: 101 mg/dL — ABNORMAL HIGH (ref 65–99)
HEMATOCRIT: 27 % — AB (ref 39.0–52.0)
Hemoglobin: 9.2 g/dL — ABNORMAL LOW (ref 13.0–17.0)
POTASSIUM: 5.6 mmol/L — AB (ref 3.5–5.1)
Sodium: 138 mmol/L (ref 135–145)
TCO2: 18 mmol/L (ref 0–100)

## 2015-02-17 LAB — COMPREHENSIVE METABOLIC PANEL
ALT: 10 U/L — ABNORMAL LOW (ref 17–63)
ANION GAP: 8 (ref 5–15)
AST: 15 U/L (ref 15–41)
Albumin: 4 g/dL (ref 3.5–5.0)
Alkaline Phosphatase: 53 U/L (ref 38–126)
BILIRUBIN TOTAL: 1 mg/dL (ref 0.3–1.2)
BUN: 55 mg/dL — ABNORMAL HIGH (ref 6–20)
CALCIUM: 8.9 mg/dL (ref 8.9–10.3)
CO2: 19 mmol/L — ABNORMAL LOW (ref 22–32)
Chloride: 111 mmol/L (ref 101–111)
Creatinine, Ser: 4.93 mg/dL — ABNORMAL HIGH (ref 0.61–1.24)
GFR calc Af Amer: 13 mL/min — ABNORMAL LOW (ref >60)
GFR, EST NON AFRICAN AMERICAN: 11 mL/min — AB (ref >60)
Glucose, Bld: 107 mg/dL — ABNORMAL HIGH (ref 65–99)
Potassium: 5.5 mmol/L — ABNORMAL HIGH (ref 3.5–5.1)
SODIUM: 138 mmol/L (ref 135–145)
TOTAL PROTEIN: 7.2 g/dL (ref 6.5–8.1)

## 2015-02-17 LAB — CBC WITH DIFFERENTIAL/PLATELET
BASOS PCT: 1 %
Basophils Absolute: 0 10*3/uL (ref 0.0–0.1)
EOS ABS: 0.2 10*3/uL (ref 0.0–0.7)
Eosinophils Relative: 8 %
HCT: 27 % — ABNORMAL LOW (ref 39.0–52.0)
HEMOGLOBIN: 9.4 g/dL — AB (ref 13.0–17.0)
LYMPHS ABS: 0.6 10*3/uL — AB (ref 0.7–4.0)
Lymphocytes Relative: 22 %
MCH: 31.5 pg (ref 26.0–34.0)
MCHC: 34.8 g/dL (ref 30.0–36.0)
MCV: 90.6 fL (ref 78.0–100.0)
Monocytes Absolute: 0.3 10*3/uL (ref 0.1–1.0)
Monocytes Relative: 10 %
NEUTROS PCT: 58 %
Neutro Abs: 1.7 10*3/uL (ref 1.7–7.7)
Platelets: 104 10*3/uL — ABNORMAL LOW (ref 150–400)
RBC: 2.98 MIL/uL — AB (ref 4.22–5.81)
RDW: 16.6 % — ABNORMAL HIGH (ref 11.5–15.5)
WBC: 2.9 10*3/uL — AB (ref 4.0–10.5)

## 2015-02-17 LAB — PROTIME-INR
INR: 1.11 (ref 0.00–1.49)
Prothrombin Time: 14.5 seconds (ref 11.6–15.2)

## 2015-02-17 LAB — I-STAT TROPONIN, ED: TROPONIN I, POC: 0.08 ng/mL (ref 0.00–0.08)

## 2015-02-17 MED ORDER — SODIUM BICARBONATE 8.4 % IV SOLN
50.0000 meq | Freq: Once | INTRAVENOUS | Status: AC
  Administered 2015-02-17: 50 meq via INTRAVENOUS
  Filled 2015-02-17: qty 50

## 2015-02-17 MED ORDER — HYDRALAZINE HCL 20 MG/ML IJ SOLN
5.0000 mg | Freq: Four times a day (QID) | INTRAMUSCULAR | Status: DC | PRN
  Administered 2015-02-17 – 2015-02-21 (×3): 5 mg via INTRAVENOUS
  Filled 2015-02-17 (×3): qty 1

## 2015-02-17 MED ORDER — HEPARIN SODIUM (PORCINE) 5000 UNIT/ML IJ SOLN
5000.0000 [IU] | Freq: Three times a day (TID) | INTRAMUSCULAR | Status: DC
  Administered 2015-02-17 – 2015-02-18 (×2): 5000 [IU] via SUBCUTANEOUS
  Filled 2015-02-17 (×3): qty 1

## 2015-02-17 MED ORDER — HYDRALAZINE HCL 20 MG/ML IJ SOLN
10.0000 mg | Freq: Once | INTRAMUSCULAR | Status: AC
  Administered 2015-02-17: 10 mg via INTRAVENOUS
  Filled 2015-02-17: qty 1

## 2015-02-17 MED ORDER — ONDANSETRON HCL 4 MG PO TABS: 4.0000 mg | ORAL_TABLET | Freq: Four times a day (QID) | ORAL | Status: DC | PRN

## 2015-02-17 MED ORDER — SODIUM POLYSTYRENE SULFONATE 15 GM/60ML PO SUSP
15.0000 g | Freq: Once | ORAL | Status: AC
  Administered 2015-02-17: 15 g via ORAL
  Filled 2015-02-17: qty 60

## 2015-02-17 MED ORDER — HYDRALAZINE HCL 10 MG PO TABS
10.0000 mg | ORAL_TABLET | Freq: Three times a day (TID) | ORAL | Status: DC
Start: 2015-02-17 — End: 2015-02-19
  Administered 2015-02-17 – 2015-02-19 (×6): 10 mg via ORAL
  Filled 2015-02-17 (×10): qty 1

## 2015-02-17 MED ORDER — SODIUM CHLORIDE 0.9 % IV SOLN
INTRAVENOUS | Status: DC
  Administered 2015-02-17: 17:00:00 via INTRAVENOUS

## 2015-02-17 MED ORDER — SODIUM CHLORIDE 0.9 % IV SOLN
1.0000 g | Freq: Once | INTRAVENOUS | Status: AC
  Administered 2015-02-17: 1 g via INTRAVENOUS
  Filled 2015-02-17: qty 10

## 2015-02-17 MED ORDER — SODIUM CHLORIDE 0.9 % IJ SOLN
3.0000 mL | Freq: Two times a day (BID) | INTRAMUSCULAR | Status: DC
  Administered 2015-02-18 – 2015-02-23 (×9): 3 mL via INTRAVENOUS

## 2015-02-17 MED ORDER — ONDANSETRON HCL 4 MG/2ML IJ SOLN: 4.0000 mg | Freq: Four times a day (QID) | INTRAMUSCULAR | Status: DC | PRN

## 2015-02-17 NOTE — ED Notes (Signed)
Spoke with Wesley [name], Agricultural consultant. Patient can be transported @ 1615.

## 2015-02-17 NOTE — ED Provider Notes (Signed)
CSN: [id number]     Arrival date & time 02/17/15  1243 History   First MD Initiated Contact with Patient 02/17/15 1304     Chief Complaint  Patient presents with  . Hypertension     (Consider location/radiation/quality/duration/timing/severity/associated sxs/prior Treatment) Patient is a 69 y.o. male presenting with hypertension. The history is provided by the patient. No language interpreter was used.  Hypertension This is a new problem. The current episode started today. The problem occurs constantly. The problem has been gradually worsening. Associated symptoms include weakness. Pertinent negatives include no abdominal pain, nausea or vomiting. Nothing aggravates the symptoms. He has tried nothing for the symptoms. The treatment provided moderate relief.  Pt reports elevated blood pressure today.  Pt reports his blood pressure was 202/118.   Pt reports his blood pressure has not been this high in the past.  Pt reports he had had increased weakness over the past few weeks.  Pt has a history of multiple myeloma.  Oncology stopped all of his medications on 10/13.  Pt was transfused 2 units of blood second to anemia.  (pt also thrombocytopenic and leukopenic)  Pt reports he has had swelling in extremities for several weeks.  Lasix recently stopped.   Pt has a renal mass per old records.  Past Medical History  Diagnosis Date  . Pulmonary nodule, right     W/  CHEST WALL MASS--  SCHEDULED FOR BIOPSY W/ DR [name] ON FRIDAY 10-27-2013  . History of seizures as a child     AGE 64 OR 8--- NONE ISSUE SINCE  . History of GI bleed     SECONDARY TO ULCER --  DEC 2011  RESOLVED PER LAST EGD 07-05-2010  . Bladder tumor   . Hematuria   . Radiation 12/07/13-12/27/13    right anterior chest 37.5 gray  . Fracture of radius 09/29/14    left  . Cancer [facility])     multiple myeloma, bladder cancer 2015   Past Surgical History  Procedure Laterality Date  . Excision benign left axilla tumor  1993  .  Esophagogastroduodenoscopy  X5   LAST ONE 07-05-2010  . Transurethral resection of bladder tumor with gyrus (turbt-gyrus) N/A 10/25/2013    Procedure: TRANSURETHRAL RESECTION OF BLADDER TUMOR WITH GYRUS (TURBT-GYRUS);  Surgeon: Sharyn Creamer, MD;  Location: [facility];  Service: Urology;  Laterality: N/A;  . Cystoscopy with biopsy N/A 10/25/2013    Procedure: CYSTOSCOPY;  Surgeon: Sharyn Creamer, MD;  Location: [facility];  Service: Urology;  Laterality: N/A;  . Bone biopsy Right 11/14/13     right seventh rib lesion  . Orif radial fracture Left 10/22/2014    Procedure: Intramedullary nailing left radius fracture;  Surgeon: Milly Jakob, MD;  Location: [place];  Service: Orthopedics;  Laterality: Left;  OPEN TREATMENT LEFT RADIUS FRACTURE   Family History  Problem Relation Age of Onset  . Heart disease Mother   . Cancer - Prostate Brother    Social History  Substance Use Topics  . Smoking status: Former Smoker -- 0.10 packs/day for 1 years    Types: Cigarettes    Quit date: 05/04/1969  . Smokeless tobacco: Never Used  . Alcohol Use: No    Review of Systems  Gastrointestinal: Negative for nausea, vomiting and abdominal pain.  Neurological: Positive for weakness.  All other systems reviewed and are negative.     Allergies  Review of patient's allergies indicates no known allergies.  Home  Medications   Prior to Admission medications   Medication Sig Start Date End Date Taking? Authorizing Provider  acyclovir (ZOVIRAX) 400 MG tablet Take 1 tablet (400 mg total) by mouth 2 (two) times daily. 12/20/14   Carlton Adam, PA-C  Aspirin-Acetaminophen-Caffeine [phone] MG PACK Take 1 packet by mouth every 4 (four) hours as needed (for pain).    Historical Provider, MD  dexamethasone (DECADRON) 4 MG tablet 10 tab po weekly with chemo 11/13/14   [name], MD  furosemide (LASIX) 20 MG tablet Take 1 tablet (20 mg total) by  mouth daily. 01/29/15   Marletta Lor, MD  lenalidomide (REVLIMID) 25 MG capsule Take 1 capsule (25 mg total) by mouth daily. Take 1 capsule (25 mg)\ po every day for 21 days every 4 weeks auth number [id number] 01/16/15. Adult male- 01/16/15   [name], MD  potassium chloride SA (K-DUR,KLOR-CON) 20 MEQ tablet Take 1 tablet (20 meq) PO only on the days that you take Lasix. 01/03/15   Susanne Borders, NP  prochlorperazine (COMPAZINE) 10 MG tablet Take 1 tablet (10 mg total) by mouth every 6 (six) hours as needed for nausea or vomiting. 11/23/14   [name], MD  warfarin (COUMADIN) 2 MG tablet Take 1 tablet (2 mg total) by mouth daily. 01/31/15   [name], MD   BP 188/112 mmHg  Pulse 86  Temp(Src) 98.1 F (36.7 C) (Oral)  Resp 20  SpO2 99% Physical Exam  Constitutional: He is oriented to person, place, and time. He appears well-developed and well-nourished.  HENT:  Head: Normocephalic and atraumatic.  Right Ear: External ear normal.  Nose: Nose normal.  Mouth/Throat: Oropharynx is clear and moist.  Eyes: Conjunctivae and EOM are normal. Pupils are equal, round, and reactive to light.  Neck: Normal range of motion.  Cardiovascular: Normal rate and normal heart sounds.   Pulmonary/Chest: Effort normal.  Abdominal: He exhibits no distension.  Musculoskeletal: Normal range of motion.  Neurological: He is alert and oriented to person, place, and time.  Skin: Skin is warm.  Psychiatric: He has a normal mood and affect.  Nursing note and vitals reviewed.   ED Course  Procedures (including critical care time) Labs Review Labs Reviewed - No data to display  Imaging Review Dg Chest 2 View  02/17/2015  CLINICAL DATA:  History of multiple myeloma. Cough and congestion. Hypertension. EXAM: CHEST  2 VIEW COMPARISON:  Chest CT, 11/23/2014. FINDINGS: Cardiac silhouette top-normal in size. No mediastinal or hilar masses or convincing adenopathy. Clear lungs.  No pleural effusion  or pneumothorax. Surgical clips projects along the left anterior chest wall, unchanged. There is resorption of the lateral right seventh rib, stable from the prior CT with there was a soft tissue mass noted. IMPRESSION: 1. No acute cardiopulmonary disease. 2. Resorbed lateral right seventh rib without significant change from the prior chest CT. Electronically Signed   By: Lajean Manes M.D.   On: 02/17/2015 14:38   I have personally reviewed and evaluated these images and lab results as part of my medical decision-making.   EKG Interpretation   Date/Time:  Sunday February 17 2015 13:11:19 EDT Ventricular Rate:  78 PR Interval:  159 QRS Duration: 77 QT Interval:  399 QTC Calculation: 454 R Axis:   -3 Text Interpretation:  Sinus rhythm Probable left atrial enlargement No  previous ECGs available Confirmed by YAO  MD, DAVID ([id number]) on 02/17/2015  1:46:58 PM      MDM  Pt has increased  BUN and creatinine since Thursday.  Pt now has elevated K of 5.6.    Dr. Darl Householder in to see and examine.  Blood pressure improved with hydralazine 10 mg.    I spoke to Dr. Doyle Askew will admit.    Final diagnoses:  Essential hypertension  Renal failure  Multiple myeloma, remission status unspecified ([place])  Leukocytopenia        Fransico Meadow, PA-C 02/17/15 [place] Yao, MD 02/17/15 [phone]

## 2015-02-17 NOTE — ED Notes (Signed)
Pt reports elevated BP readings he recorded this AM. Pt reports BP of 202/118 before attending church and 185/117 while at church. Pt denies H/A, blurred vision, or dizziness. Pt A+OX4 speaking in complete sentences in NAD.

## 2015-02-17 NOTE — ED Notes (Signed)
Patient denies pain and is waiting for MD comfortably.

## 2015-02-17 NOTE — Progress Notes (Addendum)
ANTICOAGULATION CONSULT NOTE - Initial Consult  Pharmacy Consult for Coumadin Indication: VTE prophylaxis  No Known Allergies  Patient Measurements: Height: _0  (172.7 cm) Weight: 208 lb 0.7 oz (94.369 kg) IBW/kg (Calculated) : 68.4  Vital Signs: Temp: 99 F (37.2 C) (10/16 1644) Temp Source: Oral (10/16 1644) BP: 169/88 mmHg (10/16 1644) Pulse Rate: 81 (10/16 1644)  Labs:  Recent Labs  02/17/15 1348 02/17/15 1403  HGB 9.4* 9.2*  HCT 27.0* 27.0*  PLT 104*  --   LABPROT 14.5  --   INR 1.11  --   CREATININE 4.93* 5.20*    Estimated Creatinine Clearance: 15.4 mL/min (by C-G formula based on Cr of 5.2).   Medical History: Past Medical History  Diagnosis Date  . Pulmonary nodule, right     W/  CHEST WALL MASS--  SCHEDULED FOR BIOPSY W/ DR [name] ON FRIDAY 10-27-2013  . History of seizures as a child     AGE 61 OR 8--- NONE ISSUE SINCE  . History of GI bleed     SECONDARY TO ULCER --  DEC 2011  RESOLVED PER LAST EGD 07-05-2010  . Bladder tumor   . Hematuria   . Radiation 12/07/13-12/27/13    right anterior chest 37.5 gray  . Fracture of radius 09/29/14    left  . Cancer ([place])     multiple myeloma, bladder cancer 2015    Medications:  Coumadin 74m po daily  Assessment: 69yo M with multiple myeloma.  He was started on Coumadin 271mdaily in July 2016 by Dr MoJulien Nordmannor VTE prevention while on Revlimid, which has since been discontinued.   Patient has already taken Coumadin dose today.   INR at baseline and does not appear routine INR monitoring is performed.   No bleeding noted.  Patient discussed with Dr MyDoyle Askew   Goal of Therapy:  VTE prevention   Plan:  F/U with Dr MoJulien Nordmannn Monday re: Coumadin plan, goals Add sq heparin q8h for VTE px  Laquiesha Piacente, AnLavonia Drafts0/16/2016,5:31 PM

## 2015-02-17 NOTE — H&P (Addendum)
[facility] History and Physical  Wesley [name] WUJ:[id number] DOB: Oct 15, 1945 DOA: 02/17/2015  Referring physician: ED PA, Threasa Alpha PCP: [name], MD   Chief Complaint: high blood pressure and weakness   HPI:  Pt is 69 yo male with known MM, follows with Dr [name], presented to [facility] ED with main concern of high BP at home as high as 202/118 and has been associated with progressive weakness. Pt denies any fevers, chills, abd or urinary concerns, no specific focal neurological symptoms such as numbness or tingling, no headaches. Pt reports that oncologist has stopped all of his medications including Revlimid,   In ED, pt noted to be hemodynamically stable, VS notable for SBP in 150 -180's, blood work notable for WBC 2.9, Hg 9.2, Plt 104, K 5.5, Cr 4.93 --> 5.20. TRH asked to admit to telemetry bed.   Assessment and Plan:  Principal Problem:   Hypertensive urgency - admit to telemetry bed - place on Hydralazine 10 mg PO TID  - add Hydralazine as needed for better BP control   Active Problems:   Kappa light chain myeloma (HCC) - will notify primary oncologist of pt's admission    Acute renal failure (ARF) ([place]) with metabolic acidosis  - place on IVF, hold nephrotoxic medications - repeat BMP in AM    Anemia of chronic disease - no signs of bleeding - CBC in AM    Thrombocytopenia (HCC) - secondary to malignancy - no signs of bleeding - CBC In AM    Hyperkalemia - give one dose of kayexalate - repeat BMP In AM    Leukocytopenia - will discuss with oncologist if Neupogen may be considered  - CBC In AM  DVT prophylaxis - Coumadin per pharmacy   Radiological Exams on Admission: Dg Chest 2 View 02/17/2015  No acute cardiopulmonary disease. 2. Resorbed lateral right seventh rib without significant change from the prior chest CT.  Code Status: Full Family Communication: Pt and family at bedside Disposition Plan: Admit for further evaluation    [facility] [phone]   Review of Systems:  Constitutional: Negative for fever, chills. Negative for diaphoresis.  HENT: Negative for hearing loss, ear pain, nosebleeds, congestion, sore throat, neck pain, tinnitus and ear discharge.   Eyes: Negative for blurred vision, double vision, photophobia, pain, discharge and redness.  Respiratory: Negative for cough, hemoptysis, sputum production, shortness of breath, wheezing and stridor.   Cardiovascular: Negative for chest pain, palpitations, orthopnea, claudication and leg swelling.  Gastrointestinal: NNegative for heartburn, constipation, blood in stool and melena.  Genitourinary: Negative for dysuria, urgency, frequency, hematuria and flank pain.  Musculoskeletal: Negative for myalgias, back pain, joint pain and falls.  Skin: Negative for itching and rash.  Neurological: Negative for tingling, tremors, sensory change, speech change, focal weakness, loss of consciousness and headaches.  Endo/Heme/Allergies: Negative for environmental allergies and polydipsia. Does not bruise/bleed easily.  Psychiatric/Behavioral: Negative for suicidal ideas. The patient is not nervous/anxious.      Past Medical History  Diagnosis Date  . Pulmonary nodule, right     W/  CHEST WALL MASS--  SCHEDULED FOR BIOPSY W/ DR [name] ON FRIDAY 10-27-2013  . History of seizures as a child     AGE 89 OR 8--- NONE ISSUE SINCE  . History of GI bleed     SECONDARY TO ULCER --  DEC 2011  RESOLVED PER LAST EGD 07-05-2010  . Bladder tumor   . Hematuria   . Radiation 12/07/13-12/27/13    right anterior  chest 37.5 gray  . Fracture of radius 09/29/14    left  . Cancer [facility])     multiple myeloma, bladder cancer 2015    Past Surgical History  Procedure Laterality Date  . Excision benign left axilla tumor  1993  . Esophagogastroduodenoscopy  X5   LAST ONE 07-05-2010  . Transurethral resection of bladder tumor with gyrus (turbt-gyrus) N/A 10/25/2013    Procedure: TRANSURETHRAL  RESECTION OF BLADDER TUMOR WITH GYRUS (TURBT-GYRUS);  Surgeon: Magdalene Molly, MD;  Location: [facility];  Service: Urology;  Laterality: N/A;  . Cystoscopy with biopsy N/A 10/25/2013    Procedure: CYSTOSCOPY;  Surgeon: Magdalene Molly, MD;  Location: [facility];  Service: Urology;  Laterality: N/A;  . Bone biopsy Right 11/14/13     right seventh rib lesion  . Orif radial fracture Left 10/22/2014    Procedure: Intramedullary nailing left radius fracture;  Surgeon: Mack Hook, MD;  Location: [facility];  Service: Orthopedics;  Laterality: Left;  OPEN TREATMENT LEFT RADIUS FRACTURE    Social History:  reports that he quit smoking about 45 years ago. His smoking use included Cigarettes. He has a .1 pack-year smoking history. He has never used smokeless tobacco. He reports that he does not drink alcohol or use illicit drugs.  No Known Allergies  Family History  Problem Relation Age of Onset  . Heart disease Mother   . Cancer - Prostate Brother     Medication Sig  warfarin (COUMADIN) 2 MG tablet Take 1 tablet (2 mg total) by mouth daily.  furosemide (LASIX) 20 MG tablet    Physical Exam: Filed Vitals:   02/17/15 1248 02/17/15 1512  BP: 188/112 159/91  Pulse: 86 80  Temp: 98.1 F (36.7 C)   TempSrc: Oral   Resp: 20 20  SpO2: 99% 100%    Physical Exam  Constitutional: Appears well-developed and well-nourished. No distress.  HENT: Normocephalic. External right and left ear normal. Dry MM Eyes: Conjunctivae and EOM are normal. PERRLA, no scleral icterus.  Neck: Normal ROM. Neck supple. No JVD. No tracheal deviation. No thyromegaly.  CVS: RRR, S1/S2 +, no murmurs, no gallops, no carotid bruit.  Pulmonary: Effort and breath sounds normal, no stridor, rhonchi, wheezes, rales.  Abdominal: Soft. BS +,  no distension, tenderness, rebound or guarding.  Musculoskeletal: Normal range of motion.  Lymphadenopathy: No lymphadenopathy noted,  cervical, inguinal. Neuro: Alert. Normal reflexes, muscle tone coordination. No cranial nerve deficit. Skin: Skin is warm and dry. No rash noted. Not diaphoretic. No erythema. No pallor.  Psychiatric: Normal mood and affect.   Labs on Admission:  Basic Metabolic Panel:  Recent Labs Lab 02/14/15 1328 02/17/15 1348 02/17/15 1403  NA 138 138 138  K 5.1 5.5* 5.6*  CL  --  111 108  CO2 16* 19*  --   GLUCOSE 97 107* 101*  BUN 53.1* 55* 45*  CREATININE 5.4* 4.93* 5.20*  CALCIUM 8.3* 8.9  --    Liver Function Tests:  Recent Labs Lab 02/14/15 1328 02/17/15 1348  AST 10 15  ALT <9 10*  ALKPHOS 56 53  BILITOT 0.71 1.0  PROT 6.7 7.2  ALBUMIN 3.5 4.0   CBC:  Recent Labs Lab 02/14/15 1328 02/17/15 1348 02/17/15 1403  WBC 2.6* 2.9*  --   NEUTROABS 1.0* 1.7  --   HGB 7.6* 9.4* 9.2*  HCT 21.8* 27.0* 27.0*  MCV 94.4 90.6  --   PLT 100* 104*  --  EKG: pending    If 7PM-7AM, please contact night-coverage [email or web address] Password [email or web address] 02/17/2015, 3:50 PM

## 2015-02-18 DIAGNOSIS — N179 Acute kidney failure, unspecified: Secondary | ICD-10-CM

## 2015-02-18 DIAGNOSIS — N183 Chronic kidney disease, stage 3 unspecified: Secondary | ICD-10-CM | POA: Diagnosis present

## 2015-02-18 DIAGNOSIS — E872 Acidosis, unspecified: Secondary | ICD-10-CM | POA: Diagnosis present

## 2015-02-18 DIAGNOSIS — I160 Hypertensive urgency: Principal | ICD-10-CM

## 2015-02-18 DIAGNOSIS — N1330 Unspecified hydronephrosis: Secondary | ICD-10-CM | POA: Diagnosis present

## 2015-02-18 DIAGNOSIS — R1900 Intra-abdominal and pelvic swelling, mass and lump, unspecified site: Secondary | ICD-10-CM

## 2015-02-18 LAB — URINE CULTURE: CULTURE: NO GROWTH

## 2015-02-18 LAB — BASIC METABOLIC PANEL
Anion gap: 9 (ref 5–15)
BUN: 55 mg/dL — AB (ref 6–20)
CO2: 17 mmol/L — AB (ref 22–32)
Calcium: 8.6 mg/dL — ABNORMAL LOW (ref 8.9–10.3)
Chloride: 112 mmol/L — ABNORMAL HIGH (ref 101–111)
Creatinine, Ser: 5.4 mg/dL — ABNORMAL HIGH (ref 0.61–1.24)
GFR calc Af Amer: 11 mL/min — ABNORMAL LOW (ref >60)
GFR, EST NON AFRICAN AMERICAN: 10 mL/min — AB (ref >60)
GLUCOSE: 102 mg/dL — AB (ref 65–99)
POTASSIUM: 5.1 mmol/L (ref 3.5–5.1)
Sodium: 138 mmol/L (ref 135–145)

## 2015-02-18 LAB — CBC
HEMATOCRIT: 24 % — AB (ref 39.0–52.0)
Hemoglobin: 8.4 g/dL — ABNORMAL LOW (ref 13.0–17.0)
MCH: 31.6 pg (ref 26.0–34.0)
MCHC: 35 g/dL (ref 30.0–36.0)
MCV: 90.2 fL (ref 78.0–100.0)
Platelets: 101 10*3/uL — ABNORMAL LOW (ref 150–400)
RBC: 2.66 MIL/uL — ABNORMAL LOW (ref 4.22–5.81)
RDW: 16.8 % — AB (ref 11.5–15.5)
WBC: 2.7 10*3/uL — ABNORMAL LOW (ref 4.0–10.5)

## 2015-02-18 MED ORDER — WARFARIN - PHARMACIST DOSING INPATIENT: Freq: Every day | Status: DC

## 2015-02-18 MED ORDER — FINASTERIDE 5 MG PO TABS
5.0000 mg | ORAL_TABLET | Freq: Every day | ORAL | Status: DC
  Administered 2015-02-18 – 2015-02-23 (×5): 5 mg via ORAL
  Filled 2015-02-18 (×6): qty 1

## 2015-02-18 MED ORDER — WARFARIN SODIUM 2 MG PO TABS
2.0000 mg | ORAL_TABLET | Freq: Every day | ORAL | Status: DC
  Administered 2015-02-18 – 2015-02-20 (×3): 2 mg via ORAL
  Filled 2015-02-18 (×4): qty 1

## 2015-02-18 MED ORDER — TAMSULOSIN HCL 0.4 MG PO CAPS
0.4000 mg | ORAL_CAPSULE | Freq: Every day | ORAL | Status: DC
  Administered 2015-02-18 – 2015-02-23 (×5): 0.4 mg via ORAL
  Filled 2015-02-18 (×6): qty 1

## 2015-02-18 NOTE — Progress Notes (Addendum)
Patient ID: Wesley [name], male   DOB: 08-02-1945, 69 y.o.   MRN: [id number]  [facility] PROGRESS NOTE  Wesley [name] ION:[id number] DOB: 05-Feb-1946 DOA: 02/17/2015 PCP: [name], MD   Brief narrative:    Pt is 69 yo male with known MM, follows with Dr [name], presented to [facility] ED with main concern of high BP at home as high as 202/118 and has been associated with progressive weakness. Pt denies any fevers, chills, abd or urinary concerns, no specific focal neurological symptoms such as numbness or tingling, no headaches. Pt reports that oncologist has stopped all of his medications including Revlimid,   In ED, pt noted to be hemodynamically stable, VS notable for SBP in 150 -180's, blood work notable for WBC 2.9, Hg 9.2, Plt 104, K 5.5, Cr 4.93 --> 5.20. TRH asked to admit to telemetry bed.   Assessment/Plan:    Principal Problem:  Hypertensive urgency - started on Hydralazine 10 mg PO TID and BP is more stable this AM  - added Hydralazine as needed for better BP control   Active Problems:  Kappa light chain myeloma (HCC) - notified primary oncologist of pt's admission   Acute on chronic renal failure (ARF), stage III (HCC) with metabolic acidosis  - in the setting of known MM and left hydro secondary to MM and retroperitoneal mass  - now with right sided hydro and progressively worsening renal function - urologist consulted for recommendations, appreciate assistance  - pt has apparently refused foley cath placement and has also opted for now further interventions including placing nephrostomy tubes  - may need nephrology consult if renal function does not improve  - continue with flomax and finasteride for dual therapy   Anemia of chronic disease, MM - no signs of bleeding but Hg did drop overnight, possibly dilutional from IVF that pt has received in ED  - CBC in AM   Thrombocytopenia ([place]) - secondary to malignancy - no signs of bleeding - CBC In  AM   Hyperkalemia - given one dose of kayexalate - resolved  - repeat BMP In AM   Leukocytopenia - overall stable  - CBC In AM  DVT prophylaxis - Coumadin (recommended by Dr. [name])   Code Status: Full.  Family Communication:  plan of care discussed with the patient Disposition Plan: Home when renal function stabilizes   IV access:  Peripheral IV  Procedures and diagnostic studies:    Ct Abdomen Pelvis Wo Contrast 02/17/2015  Increased moderate right hydroureteronephrosis to the level of the urinary bladder, with increased bladder dilatation. This suggests vesicoureteral reflux. Stable moderate left hydronephrosis. Retroperitoneal mass in the region of the left ureteropelvic junction has decreased in size since prior study. Stable markedly enlarged prostate gland with bladder dilatation and diffuse wall thickening, consistent with bladder outlet obstruction.  Dg Chest 2 View 02/17/2015  No acute cardiopulmonary disease. 2. Resorbed lateral right seventh rib without significant change from the prior chest CT.  Medical Consultants:  Urologist  Other Consultants:  None  IAnti-Infectives:   None  Faye Ramsay, MD  [facility] Pager [phone]  If 7PM-7AM, please contact night-coverage [email or web address] Password [email or web address] 02/18/2015, 1:21 PM   LOS: 1 day   HPI/Subjective: No events overnight.   Objective: Filed Vitals:   02/17/15 1644 02/17/15 1831 02/17/15 2110 02/18/15 0522  BP: 169/88 143/77 126/68 143/84  Pulse: 81  79 74  Temp: 99 F (37.2 C)  98 F (36.7 C) 98.2 F (36.8 C)  TempSrc: Oral  Oral  Oral  Resp: 20  18 18   Height:      Weight:    93.214 kg (205 lb 8 oz)  SpO2: 100%  100% 97%    Intake/Output Summary (Last 24 hours) at 02/18/15 1321 Last data filed at 02/18/15 0606  Gross per 24 hour  Intake 456.25 ml  Output    995 ml  Net -538.75 ml    Exam:   General:  Pt is alert, follows commands appropriately, not in acute  distress  Cardiovascular: Regular rate and rhythm, no rubs, no gallops  Respiratory: Clear to auscultation bilaterally, no wheezing, no crackles, no rhonchi  Abdomen: Soft, non tender, non distended, bowel sounds present, no guarding  Data Reviewed: Basic Metabolic Panel:  Recent Labs Lab 02/14/15 1328 02/17/15 1348 02/17/15 1403 02/18/15 0450  NA 138 138 138 138  K 5.1 5.5* 5.6* 5.1  CL  --  111 108 112*  CO2 16* 19*  --  17*  GLUCOSE 97 107* 101* 102*  BUN 53.1* 55* 45* 55*  CREATININE 5.4* 4.93* 5.20* 5.40*  CALCIUM 8.3* 8.9  --  8.6*   Liver Function Tests:  Recent Labs Lab 02/14/15 1328 02/17/15 1348  AST 10 15  ALT <9 10*  ALKPHOS 56 53  BILITOT 0.71 1.0  PROT 6.7 7.2  ALBUMIN 3.5 4.0   CBC:  Recent Labs Lab 02/14/15 1328 02/17/15 1348 02/17/15 1403 02/18/15 0450  WBC 2.6* 2.9*  --  2.7*  NEUTROABS 1.0* 1.7  --   --   HGB 7.6* 9.4* 9.2* 8.4*  HCT 21.8* 27.0* 27.0* 24.0*  MCV 94.4 90.6  --  90.2  PLT 100* 104*  --  101*    Recent Results (from the past 240 hour(s))  Urine culture     Status: None (Preliminary result)   Collection Time: 02/17/15  5:00 PM  Result Value Ref Range Status   Specimen Description URINE, CLEAN CATCH  Final   Special Requests NONE  Final   Culture   Final    NO GROWTH < 24 HOURS Performed at [facility]    Report Status PENDING  Incomplete    Scheduled Meds: . heparin subcutaneous  5,000 Units Subcutaneous 3 times per day  . hydrALAZINE  10 mg Oral 3 times per day   Continuous Infusions:

## 2015-02-18 NOTE — Progress Notes (Signed)
ANTICOAGULATION CONSULT NOTE - follow-up   Pharmacy Consult for Coumadin Indication: VTE prophylaxis  No Known Allergies  Patient Measurements: Height: 5\' 8"  (172.7 cm) Weight: 205 lb 8 oz (93.214 kg) IBW/kg (Calculated) : 68.4  Vital Signs: Temp: 98.2 F (36.8 C) (10/17 0522) Temp Source: Oral (10/17 0522) BP: 143/84 mmHg (10/17 0522) Pulse Rate: 74 (10/17 0522)  Labs:  Recent Labs  02/17/15 1348 02/17/15 1403 02/18/15 0450  HGB 9.4* 9.2* 8.4*  HCT 27.0* 27.0* 24.0*  PLT 104*  --  101*  LABPROT 14.5  --   --   INR 1.11  --   --   CREATININE 4.93* 5.20* 5.40*    Estimated Creatinine Clearance: 14.7 mL/min (by C-G formula based on Cr of 5.4).   Medical History: Past Medical History  Diagnosis Date  . Pulmonary nodule, right     W/  CHEST WALL MASS--  SCHEDULED FOR BIOPSY W/ DR [name] ON FRIDAY 10-27-2013  . History of seizures as a child     AGE 39 OR 8--- NONE ISSUE SINCE  . History of GI bleed     SECONDARY TO ULCER --  DEC 2011  RESOLVED PER LAST EGD 07-05-2010  . Bladder tumor   . Hematuria   . Radiation 12/07/13-12/27/13    right anterior chest 37.5 gray  . Fracture of radius 09/29/14    left  . Cancer ([place])     multiple myeloma, bladder cancer 2015    Medications:  Coumadin 2mg  po daily  Assessment: 69 yo M with multiple myeloma.  He was started on Coumadin 2mg  daily in July 2016 by Dr [name] for VTE prevention while on Revlimid, which has since been discontinued.  Patient remains on warfarin.  Called Dr. [name] 10/17 to clarify and he wishes to continue warfarin at 2mg  daily (no INR monitoring or goal). INR at admission = 1.11.    SQ heparin was started 10/16 pending clarification with oncology  Today, 02/18/2015  CBC: Hgb low (down overnight), pltc remains low but stable  Goal of Therapy:  VTE prevention (no INR goal per oncology)   Plan:   Discuss with Dr. Doyle Askew, will Stop SQ heparin and continue warfarin as per Oncology plan  Continue  warfarin 2mg  daily  Daily INR not needed but suggest periodic monitoring during hospitalization (ex. q72h)  Doreene Eland, PharmD, BCPS.   Pager: [phone] 02/18/2015,10:10 AM

## 2015-02-18 NOTE — Care Management Note (Signed)
Case Management Note  Patient Details  Name: Wesley [name] MRN: [id number] Date of Birth: 02/22/1946  Subjective/Objective:   69 y/o m admitted w/Hypertensive urgency. From home.                 Action/Plan:d/c plan home.   Expected Discharge Date:                  Expected Discharge Plan:  Home/Self Care  In-House Referral:     Discharge planning Services  CM Consult  Post Acute Care Choice:    Choice offered to:     DME Arranged:    DME Agency:     HH Arranged:    HH Agency:     Status of Service:  In process, will continue to follow  Medicare Important Message Given:    Date Medicare IM Given:    Medicare IM give by:    Date Additional Medicare IM Given:    Additional Medicare Important Message give by:     If discussed at [place] of Stay Meetings, dates discussed:    Additional Comments:  Dessa Phi, RN 02/18/2015, 2:28 PM

## 2015-02-18 NOTE — Consult Note (Signed)
Urology Consult   Physician requesting consult: Theodis Blaze, MD   Reason for consult: bilateral hydronephrosis  History of Present Illness: Wesley [name] is a 69 y.o. with known multiple myeloma and left sided hydronephrosis with significant BPH. The patient was found to have elevated creatinine to 5.4 from a baseline around 0.7-1.3. The patient's urinalysis is negative for infection and the patient is afebrile. The patient has a measured prostate volume of 300 grams on CT imaging. The patient is not on alpha blocker therapy. The patient has significant voiding symptoms but is not bothered by them. He reports voiding every hour or so and has nocturia x6. The patient also has significant urgency. The patient does not leak urine.   The patient had a foley catheter in the past related to a previous transurethral procedure. The patient had urinary retention and required the foley to be replaced. The patient did not like the foley catheter.  Past Medical History  Diagnosis Date  . Pulmonary nodule, right     W/  CHEST WALL MASS--  SCHEDULED FOR BIOPSY W/ DR [name] ON FRIDAY 10-27-2013  . History of seizures as a child     AGE 36 OR 8--- NONE ISSUE SINCE  . History of GI bleed     SECONDARY TO ULCER --  DEC 2011  RESOLVED PER LAST EGD 07-05-2010  . Bladder tumor   . Hematuria   . Radiation 12/07/13-12/27/13    right anterior chest 37.5 gray  . Fracture of radius 09/29/14    left  . Cancer [facility])     multiple myeloma, bladder cancer 2015    Past Surgical History  Procedure Laterality Date  . Excision benign left axilla tumor  1993  . Esophagogastroduodenoscopy  X5   LAST ONE 07-05-2010  . Transurethral resection of bladder tumor with gyrus (turbt-gyrus) N/A 10/25/2013    Procedure: TRANSURETHRAL RESECTION OF BLADDER TUMOR WITH GYRUS (TURBT-GYRUS);  Surgeon: Sharyn Creamer, MD;  Location: [facility];  Service: Urology;  Laterality: N/A;  . Cystoscopy with biopsy N/A 10/25/2013     Procedure: CYSTOSCOPY;  Surgeon: Sharyn Creamer, MD;  Location: [facility];  Service: Urology;  Laterality: N/A;  . Bone biopsy Right 11/14/13     right seventh rib lesion  . Orif radial fracture Left 10/22/2014    Procedure: Intramedullary nailing left radius fracture;  Surgeon: Milly Jakob, MD;  Location: [place];  Service: Orthopedics;  Laterality: Left;  OPEN TREATMENT LEFT RADIUS FRACTURE    Current Hospital Medications:  Home Meds:    Medication List    ASK your doctor about these medications        acyclovir 400 MG tablet  Commonly known as:  ZOVIRAX  Take 1 tablet (400 mg total) by mouth 2 (two) times daily.     dexamethasone 4 MG tablet  Commonly known as:  DECADRON  10 tab po weekly with chemo     furosemide 20 MG tablet  Commonly known as:  LASIX  Take 1 tablet (20 mg total) by mouth daily.     lenalidomide 25 MG capsule  Commonly known as:  REVLIMID  Take 1 capsule (25 mg total) by mouth daily. Take 1 capsule (25 mg)\ po every day for 21 days every 4 weeks auth number [id number] 01/16/15. Adult male-     potassium chloride SA 20 MEQ tablet  Commonly known as:  K-DUR,KLOR-CON  Take 1 tablet (20 meq) PO only on the  days that you take Lasix.     prochlorperazine 10 MG tablet  Commonly known as:  COMPAZINE  Take 1 tablet (10 mg total) by mouth every 6 (six) hours as needed for nausea or vomiting.     warfarin 2 MG tablet  Commonly known as:  COUMADIN  Take 1 tablet (2 mg total) by mouth daily.        Scheduled Meds: . hydrALAZINE  10 mg Oral 3 times per day  . sodium chloride  3 mL Intravenous Q12H  . warfarin  2 mg Oral q1800  . Warfarin - Pharmacist Dosing Inpatient   Does not apply q1800   Continuous Infusions:  PRN Meds:.hydrALAZINE, ondansetron **OR** ondansetron (ZOFRAN) IV  Allergies: No Known Allergies  Family History  Problem Relation Age of Onset  . Heart disease Mother   . Cancer - Prostate Brother      Social History:  reports that he quit smoking about 45 years ago. His smoking use included Cigarettes. He has a .1 pack-year smoking history. He has never used smokeless tobacco. He reports that he does not drink alcohol or use illicit drugs.  ROS: A complete review of systems was performed.  All systems are negative except for pertinent findings as noted.  Physical Exam:  Vital signs in last 24 hours: Temp:  [98 F (36.7 C)-99 F (37.2 C)] 98.8 F (37.1 C) (10/17 1440) Pulse Rate:  [73-81] 73 (10/17 1440) Resp:  [18-21] 18 (10/17 1440) BP: (126-171)/(68-100) 155/95 mmHg (10/17 1440) SpO2:  [97 %-100 %] 100 % (10/17 1440) Weight:  [93.214 kg (205 lb 8 oz)-94.369 kg (208 lb 0.7 oz)] 93.214 kg (205 lb 8 oz) (10/17 0522) Constitutional:  Alert and oriented, No acute distress Cardiovascular: Regular rate and rhythm, No JVD Respiratory: Normal respiratory effort, Lungs clear bilaterally GI: Abdomen is soft, nontender, nondistended, no abdominal masses GU: No CVA tenderness Lymphatic: No lymphadenopathy Neurologic: Grossly intact, no focal deficits Psychiatric: Normal mood and affect  Laboratory Data:   Recent Labs  02/17/15 1348 02/17/15 1403 02/18/15 0450  WBC 2.9*  --  2.7*  HGB 9.4* 9.2* 8.4*  HCT 27.0* 27.0* 24.0*  PLT 104*  --  101*     Recent Labs  02/17/15 1348 02/17/15 1403 02/18/15 0450  NA 138 138 138  K 5.5* 5.6* 5.1  CL 111 108 112*  GLUCOSE 107* 101* 102*  BUN 55* 45* 55*  CALCIUM 8.9  --  8.6*  CREATININE 4.93* 5.20* 5.40*     Results for orders placed or performed during the hospital encounter of 02/17/15 (from the past 24 hour(s))  Urine culture     Status: None (Preliminary result)   Collection Time: 02/17/15  5:00 PM  Result Value Ref Range   Specimen Description URINE, CLEAN CATCH    Special Requests NONE    Culture      NO GROWTH < 24 HOURS Performed at [facility]    Report Status PENDING   Urinalysis, Routine w reflex  microscopic (not at [facility])     Status: None   Collection Time: 02/17/15  5:00 PM  Result Value Ref Range   Color, Urine YELLOW YELLOW   APPearance CLEAR CLEAR   Specific Gravity, Urine 1.005 1.005 - 1.030   pH 6.0 5.0 - 8.0   Glucose, UA NEGATIVE NEGATIVE mg/dL   Hgb urine dipstick NEGATIVE NEGATIVE   Bilirubin Urine NEGATIVE NEGATIVE   Ketones, ur NEGATIVE NEGATIVE mg/dL   Protein, ur NEGATIVE NEGATIVE  mg/dL   Urobilinogen, UA 0.2 0.0 - 1.0 mg/dL   Nitrite NEGATIVE NEGATIVE   Leukocytes, UA NEGATIVE NEGATIVE  Basic metabolic panel     Status: Abnormal   Collection Time: 02/18/15  4:50 AM  Result Value Ref Range   Sodium 138 135 - 145 mmol/L   Potassium 5.1 3.5 - 5.1 mmol/L   Chloride 112 (H) 101 - 111 mmol/L   CO2 17 (L) 22 - 32 mmol/L   Glucose, Bld 102 (H) 65 - 99 mg/dL   BUN 55 (H) 6 - 20 mg/dL   Creatinine, Ser 5.40 (H) 0.61 - 1.24 mg/dL   Calcium 8.6 (L) 8.9 - 10.3 mg/dL   GFR calc non Af Amer 10 (L) >60 mL/min   GFR calc Af Amer 11 (L) >60 mL/min   Anion gap 9 5 - 15  CBC     Status: Abnormal   Collection Time: 02/18/15  4:50 AM  Result Value Ref Range   WBC 2.7 (L) 4.0 - 10.5 K/uL   RBC 2.66 (L) 4.22 - 5.81 MIL/uL   Hemoglobin 8.4 (L) 13.0 - 17.0 g/dL   HCT 24.0 (L) 39.0 - 52.0 %   MCV 90.2 78.0 - 100.0 fL   MCH 31.6 26.0 - 34.0 pg   MCHC 35.0 30.0 - 36.0 g/dL   RDW 16.8 (H) 11.5 - 15.5 %   Platelets 101 (L) 150 - 400 K/uL   Recent Results (from the past 240 hour(s))  Urine culture     Status: None (Preliminary result)   Collection Time: 02/17/15  5:00 PM  Result Value Ref Range Status   Specimen Description URINE, CLEAN CATCH  Final   Special Requests NONE  Final   Culture   Final    NO GROWTH < 24 HOURS Performed at [facility]    Report Status PENDING  Incomplete    Renal Function:  Recent Labs  02/14/15 1328 02/17/15 1348 02/17/15 1403 02/18/15 0450  CREATININE 5.4* 4.93* 5.20* 5.40*   Estimated Creatinine Clearance: 14.7 mL/min (by  C-G formula based on Cr of 5.4).  Radiologic Imaging: Ct Abdomen Pelvis Wo Contrast  02/17/2015  CLINICAL DATA:  Renal failure. Hydronephrosis. Weakness. Multiple myeloma. Recently completed chemotherapy. EXAM: CT ABDOMEN AND PELVIS WITHOUT CONTRAST TECHNIQUE: Multidetector CT imaging of the abdomen and pelvis was performed following the standard protocol without IV contrast. COMPARISON:  12/12/2014 FINDINGS: Lower chest:  No acute findings. Hepatobiliary: No mass visualized on this un-enhanced exam. Gallbladder is unremarkable. Pancreas: No mass or inflammatory process identified on this un-enhanced exam. Spleen: Within normal limits in size. Adrenals/Urinary Tract: No adrenal masses identified. No evidence of renal or ureteral calculi. Increased moderate right hydroureteronephrosis is seen to the level the bladder. The urinary bladder shows increased distention and persistent diffuse wall thickening. Markedly enlarged prostate gland again noted. The right-sided hydroureteronephrosis is likely due to vesicoureteral reflux. Moderate left hydronephrosis shows no significant change. This appears to be due to a retroperitoneal mass in the region of the left ureteropelvic junction. This has significantly decreased in size since previous study, currently measuring 2.3 x 3.2 cm on image 37 series 3 compared to 4.3 x 4.8 cm previously. Stomach/Bowel: No evidence of obstruction, inflammatory process, or abnormal fluid collections. Vascular/Lymphatic: No pathologically enlarged lymph nodes. No evidence of abdominal aortic aneurysm. Reproductive: Markedly enlarged prostate gland again seen indenting the urinary bladder. Other: Small bilateral inguinal hernias containing only fat. Musculoskeletal:  No suspicious bone lesions identified. IMPRESSION: Increased moderate right  hydroureteronephrosis to the level of the urinary bladder, with increased bladder dilatation. This suggests vesicoureteral reflux. Stable moderate left  hydronephrosis. Retroperitoneal mass in the region of the left ureteropelvic junction has decreased in size since prior study. Stable markedly enlarged prostate gland with bladder dilatation and diffuse wall thickening, consistent with bladder outlet obstruction. Electronically Signed   By: Earle Gell M.D.   On: 02/17/2015 16:11   Dg Chest 2 View  02/17/2015  CLINICAL DATA:  History of multiple myeloma. Cough and congestion. Hypertension. EXAM: CHEST  2 VIEW COMPARISON:  Chest CT, 11/23/2014. FINDINGS: Cardiac silhouette top-normal in size. No mediastinal or hilar masses or convincing adenopathy. Clear lungs.  No pleural effusion or pneumothorax. Surgical clips projects along the left anterior chest wall, unchanged. There is resorption of the lateral right seventh rib, stable from the prior CT with there was a soft tissue mass noted. IMPRESSION: 1. No acute cardiopulmonary disease. 2. Resorbed lateral right seventh rib without significant change from the prior chest CT. Electronically Signed   By: Lajean Manes M.D.   On: 02/17/2015 14:38    I independently reviewed the above imaging studies.  Impression/Recommendation 69 y.o. male with a 300 gram prostate, previous known left hydronephrosis related to multiple myeloma and now right sided hydro with AKI.  Recommendations:  - Foley catheter for decompression. The patient refused foley catheter after a long discussion of the potential progression of his renal failure. I offered the patient nephrostomy tubes to bypass the undesired problems with the foley including bladder spasms and the risk of needing a foley put back in for retention. The patient was adamant that he did not want any invasive procedures at this time. The patient seems suspicious of side effects since he feels he did not have a good understanding of the side effects of some of his MM medications and now has significant doubts as to the benefits of other interventions. - Flomax and  finasteride for dual therapy, the patient has agreed to start these medicaitions   Christell Faith 02/18/2015, 3:31 PM

## 2015-02-19 DIAGNOSIS — D6481 Anemia due to antineoplastic chemotherapy: Secondary | ICD-10-CM | POA: Diagnosis present

## 2015-02-19 DIAGNOSIS — N1330 Unspecified hydronephrosis: Secondary | ICD-10-CM

## 2015-02-19 DIAGNOSIS — N2889 Other specified disorders of kidney and ureter: Secondary | ICD-10-CM

## 2015-02-19 DIAGNOSIS — E872 Acidosis: Secondary | ICD-10-CM

## 2015-02-19 DIAGNOSIS — D72819 Decreased white blood cell count, unspecified: Secondary | ICD-10-CM

## 2015-02-19 DIAGNOSIS — N08 Glomerular disorders in diseases classified elsewhere: Secondary | ICD-10-CM

## 2015-02-19 DIAGNOSIS — D638 Anemia in other chronic diseases classified elsewhere: Secondary | ICD-10-CM

## 2015-02-19 DIAGNOSIS — C9000 Multiple myeloma not having achieved remission: Secondary | ICD-10-CM | POA: Diagnosis present

## 2015-02-19 DIAGNOSIS — I10 Essential (primary) hypertension: Secondary | ICD-10-CM | POA: Diagnosis present

## 2015-02-19 DIAGNOSIS — D696 Thrombocytopenia, unspecified: Secondary | ICD-10-CM

## 2015-02-19 DIAGNOSIS — N185 Chronic kidney disease, stage 5: Secondary | ICD-10-CM | POA: Diagnosis present

## 2015-02-19 DIAGNOSIS — E875 Hyperkalemia: Secondary | ICD-10-CM

## 2015-02-19 DIAGNOSIS — T451X5A Adverse effect of antineoplastic and immunosuppressive drugs, initial encounter: Secondary | ICD-10-CM

## 2015-02-19 DIAGNOSIS — D701 Agranulocytosis secondary to cancer chemotherapy: Secondary | ICD-10-CM | POA: Diagnosis present

## 2015-02-19 LAB — RENAL FUNCTION PANEL
ALBUMIN: 3.6 g/dL (ref 3.5–5.0)
ANION GAP: 13 (ref 5–15)
BUN: 59 mg/dL — ABNORMAL HIGH (ref 6–20)
CALCIUM: 8.9 mg/dL (ref 8.9–10.3)
CO2: 17 mmol/L — ABNORMAL LOW (ref 22–32)
Chloride: 107 mmol/L (ref 101–111)
Creatinine, Ser: 5.84 mg/dL — ABNORMAL HIGH (ref 0.61–1.24)
GFR, EST AFRICAN AMERICAN: 10 mL/min — AB (ref >60)
GFR, EST NON AFRICAN AMERICAN: 9 mL/min — AB (ref >60)
GLUCOSE: 105 mg/dL — AB (ref 65–99)
PHOSPHORUS: 4.6 mg/dL (ref 2.5–4.6)
POTASSIUM: 5 mmol/L (ref 3.5–5.1)
SODIUM: 137 mmol/L (ref 135–145)

## 2015-02-19 LAB — CBC
HEMATOCRIT: 27.4 % — AB (ref 39.0–52.0)
HEMOGLOBIN: 9.6 g/dL — AB (ref 13.0–17.0)
MCH: 31.9 pg (ref 26.0–34.0)
MCHC: 35 g/dL (ref 30.0–36.0)
MCV: 91 fL (ref 78.0–100.0)
Platelets: 125 10*3/uL — ABNORMAL LOW (ref 150–400)
RBC: 3.01 MIL/uL — AB (ref 4.22–5.81)
RDW: 16.4 % — ABNORMAL HIGH (ref 11.5–15.5)
WBC: 3.2 10*3/uL — AB (ref 4.0–10.5)

## 2015-02-19 MED ORDER — HYDRALAZINE HCL 20 MG/ML IJ SOLN
5.0000 mg | Freq: Once | INTRAMUSCULAR | Status: AC
  Administered 2015-02-19: 5 mg via INTRAVENOUS
  Filled 2015-02-19: qty 1

## 2015-02-19 MED ORDER — HYDRALAZINE HCL 25 MG PO TABS
25.0000 mg | ORAL_TABLET | Freq: Three times a day (TID) | ORAL | Status: DC
  Administered 2015-02-19 – 2015-02-20 (×3): 25 mg via ORAL
  Filled 2015-02-19 (×4): qty 1

## 2015-02-19 MED ORDER — ACETAMINOPHEN 325 MG PO TABS
650.0000 mg | ORAL_TABLET | ORAL | Status: DC | PRN
  Administered 2015-02-19: 650 mg via ORAL
  Filled 2015-02-19: qty 2

## 2015-02-19 NOTE — Progress Notes (Signed)
Patient has a mild headache. PCP was notified.   Will continue to monitor the patient.

## 2015-02-19 NOTE — Progress Notes (Signed)
ANTICOAGULATION CONSULT NOTE - follow-up   Pharmacy Consult for Coumadin Indication: VTE prophylaxis  No Known Allergies  Patient Measurements: Height: 5\' 8"  (172.7 cm) Weight: 205 lb (92.987 kg) IBW/kg (Calculated) : 68.4  Vital Signs: Temp: 98.5 F (36.9 C) (10/18 0505) Temp Source: Oral (10/18 0505) BP: 160/99 mmHg (10/18 0505) Pulse Rate: 87 (10/18 0505)  Labs:  Recent Labs  02/17/15 1348 02/17/15 1403 02/18/15 0450 02/19/15 0508  HGB 9.4* 9.2* 8.4* 9.6*  HCT 27.0* 27.0* 24.0* 27.4*  PLT 104*  --  101* 125*  LABPROT 14.5  --   --   --   INR 1.11  --   --   --   CREATININE 4.93* 5.20* 5.40* 5.84*    Estimated Creatinine Clearance: 13.6 mL/min (by C-G formula based on Cr of 5.84).   Medical History: Past Medical History  Diagnosis Date  . Pulmonary nodule, right     W/  CHEST WALL MASS--  SCHEDULED FOR BIOPSY W/ DR [name] ON FRIDAY 10-27-2013  . History of seizures as a child     AGE 72 OR 8--- NONE ISSUE SINCE  . History of GI bleed     SECONDARY TO ULCER --  DEC 2011  RESOLVED PER LAST EGD 07-05-2010  . Bladder tumor   . Hematuria   . Radiation 12/07/13-12/27/13    right anterior chest 37.5 gray  . Fracture of radius 09/29/14    left  . Cancer ([place])     multiple myeloma, bladder cancer 2015    Medications:  Coumadin 2mg  po daily  Assessment: 69 yo M with multiple myeloma.  He was started on Coumadin 2mg  daily in July 2016 by Dr [name] for VTE prevention while on Revlimid, which has since been discontinued.  Patient remains on warfarin.  Called Dr. [name] 10/17 to clarify plan for warfarin and he wishes to continue warfarin at 2mg  daily (no INR monitoring or goal). INR at admission = 1.11.    SQ heparin was started 10/16 pending clarification with oncology (Stopped 10/17)  Today, 02/19/2015  CBC: Hgb low (down overnight), pltc remains low but improving (remains > 100)  Goal of Therapy:  VTE prevention (no INR goal per oncology)   Plan:    Continue warfarin 2mg  daily  INR q72h as warfarin for prophylaxis, pharmacy to follow-up on days INR are drawn  Doreene Eland, PharmD, BCPS.   Pager: [phone] 02/19/2015,9:59 AM

## 2015-02-19 NOTE — Progress Notes (Signed)
Subjective: The patient's creatinine worsened from yesterday. The patient continues to urinate and has no flank pain.  Objective: Vital signs in last 24 hours: Temp:  [98.5 F (36.9 C)-99.2 F (37.3 C)] 98.5 F (36.9 C) (10/18 1426) Pulse Rate:  [73-87] 82 (10/18 1426) Resp:  [16-18] 16 (10/18 1426) BP: (155-180)/(86-100) 160/89 mmHg (10/18 1426) SpO2:  [100 %] 100 % (10/18 1426) Weight:  [92.987 kg (205 lb)] 92.987 kg (205 lb) (10/18 0505)  Intake/Output from previous day: 10/17 0701 - 10/18 0700 In: 720 [P.O.:720] Out: 525 [Urine:525] Intake/Output this shift: Total I/O In: 230 [P.O.:230] Out: 900 [Urine:900]  Physical Exam:  General: Alert and oriented CV: RRR Lungs: Clear Abdomen: Soft, ND Ext: NT, No erythema  Lab Results:  Recent Labs  02/17/15 1403 02/18/15 0450 02/19/15 0508  HGB 9.2* 8.4* 9.6*  HCT 27.0* 24.0* 27.4*   BMET  Recent Labs  02/18/15 0450 02/19/15 0508  NA 138 137  K 5.1 5.0  CL 112* 107  CO2 17* 17*  GLUCOSE 102* 105*  BUN 55* 59*  CREATININE 5.40* 5.84*  CALCIUM 8.6* 8.9     Studies/Results: Ct Abdomen Pelvis Wo Contrast  02/17/2015  CLINICAL DATA:  Renal failure. Hydronephrosis. Weakness. Multiple myeloma. Recently completed chemotherapy. EXAM: CT ABDOMEN AND PELVIS WITHOUT CONTRAST TECHNIQUE: Multidetector CT imaging of the abdomen and pelvis was performed following the standard protocol without IV contrast. COMPARISON:  12/12/2014 FINDINGS: Lower chest:  No acute findings. Hepatobiliary: No mass visualized on this un-enhanced exam. Gallbladder is unremarkable. Pancreas: No mass or inflammatory process identified on this un-enhanced exam. Spleen: Within normal limits in size. Adrenals/Urinary Tract: No adrenal masses identified. No evidence of renal or ureteral calculi. Increased moderate right hydroureteronephrosis is seen to the level the bladder. The urinary bladder shows increased distention and persistent diffuse wall  thickening. Markedly enlarged prostate gland again noted. The right-sided hydroureteronephrosis is likely due to vesicoureteral reflux. Moderate left hydronephrosis shows no significant change. This appears to be due to a retroperitoneal mass in the region of the left ureteropelvic junction. This has significantly decreased in size since previous study, currently measuring 2.3 x 3.2 cm on image 37 series 3 compared to 4.3 x 4.8 cm previously. Stomach/Bowel: No evidence of obstruction, inflammatory process, or abnormal fluid collections. Vascular/Lymphatic: No pathologically enlarged lymph nodes. No evidence of abdominal aortic aneurysm. Reproductive: Markedly enlarged prostate gland again seen indenting the urinary bladder. Other: Small bilateral inguinal hernias containing only fat. Musculoskeletal:  No suspicious bone lesions identified. IMPRESSION: Increased moderate right hydroureteronephrosis to the level of the urinary bladder, with increased bladder dilatation. This suggests vesicoureteral reflux. Stable moderate left hydronephrosis. Retroperitoneal mass in the region of the left ureteropelvic junction has decreased in size since prior study. Stable markedly enlarged prostate gland with bladder dilatation and diffuse wall thickening, consistent with bladder outlet obstruction. Electronically Signed   By: Earle Gell M.D.   On: 02/17/2015 16:11    Assessment/Plan: 69 y.o. male with a 300 gram prostate, previous known left hydronephrosis related to multiple myeloma and now right sided hydro with AKI. 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 The patient is very resistant to any intervention and seems unclear on the true risks and benefits. It is difficult to communicate with the patient that we have tools to improve his renal function but the patient does not seem to grasp that a partial obstruction can lead to kidney dysfunction. The patient is reporting  that he  is urinating so he must have kidneys that are working. I told the patient that he has minimal function and does not have complete renal failure and so would continue to make urine but that his renal function is severely impaired. I was very clear with the patient that his bilateral obstruction was likely a leading cause of his renal dysfunction and that interventions including a foley catheter and likely ureteral stents vs nephrostomy tubes would be needed to give his kidneys the best chance possible at recovery. The patient voices understanding of my recommendations and continues to refuse any intervention besides flomax and finasteride.    LOS: 2 days   Christell Faith 02/19/2015, 2:35 PM

## 2015-02-19 NOTE — Progress Notes (Addendum)
Patient ID: Wesley [name], male   DOB: 03-06-1946, 69 y.o.   MRN: [id number] [facility] PROGRESS NOTE  Wesley [name] YQM:[id number] DOB: Aug 14, 1945 DOA: 02/17/2015 PCP: [name], MD  Brief narrative:    69 yo male with past medical history of multiple myeloma (follows with Dr [name]), radiation to the tumor mass at the right anterior chest under the care of Dr. [name] completed 12/07/2013 through 12/27/2013, was on systemic chemotherapy with Velcade, Revlimid and Decadron, seen in cancer center 01/31/2015 at which time decision made to hold off on tx due to worsening renal failure.   Pt presented to [facility] ED with BP of 202/118 and generalized weakness without neurologic complaints. On admission, CT abdomen demonstrated bialteral hydronephrosis. In addition, his creatinine was 5.20 (on 10/13 Cr was 5.4)   Anticipated discharge: home once renal function improves.   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 [name] of oncology  - His tx with revlimid, velcade and  decadron stopped due to worsening renal failure    Anemia of chronic disease / Antineoplastic chemotherapy induced anemia / Leukopenia due to antineoplastic chemotherapy / Thrombocytopenia ([place]) - All related to sequela of chemotherapy  - Counts stable - Monitor daily CBC  General weakness - Due to MM - PT eval pending   DVT Prophylaxis  - On anticoagulation with coumadin  Code Status: Full.  Family Communication:  plan of care discussed with the patient Disposition Plan: Home once renal function improves.   IV access:  Peripheral IV  Procedures and diagnostic studies:    Ct Abdomen Pelvis Wo Contrast 02/17/2015 Increased moderate right hydroureteronephrosis to the level of the urinary bladder, with increased bladder dilatation. This suggests vesicoureteral reflux. Stable moderate left hydronephrosis. Retroperitoneal mass in the region of the left ureteropelvic junction has decreased in size since prior study. Stable markedly enlarged prostate gland with bladder dilatation and diffuse wall thickening, consistent with bladder outlet obstruction. Electronically Signed   By: Earle Gell M.D.   On: 02/17/2015 16:11   Dg Chest 2 View 02/17/2015   1. No acute cardiopulmonary disease. 2. Resorbed lateral right seventh rib without significant change from the prior chest CT. Electronically Signed   By: Lajean Manes M.D.   On: 02/17/2015 14:38    Medical Consultants:  Urology, Dr. Cristela Felt  Renal, Dr. Roney Jaffe  Other Consultants:  Physical therapy    IAnti-Infectives:   None    Leisa Lenz, MD  [facility] Pager [phone]  Time spent in minutes: 25 minutes  If 7PM-7AM, please contact night-coverage [email or web address] Password [email or web address] 02/19/2015, 11:42 AM   LOS: 2 days  HPI/Subjective: No acute overnight events. Patient reports feeling better today.   Objective: Filed Vitals:   02/18/15 0522 02/18/15 1440 02/18/15 2214 02/19/15 0505  BP: 143/84 155/95  158/86 160/99  Pulse: 74 73 86 87  Temp: 98.2 F (36.8 C) 98.8 F (37.1 C) 99.2 F (37.3 C) 98.5 F (36.9 C)  TempSrc: Oral Oral Oral Oral  Resp: $Remo'18 18 18 18  'qwjVl$ Height:      Weight: 93.214 kg (205 lb 8 oz)   92.987 kg (205 lb)  SpO2: 97% 100% 100% 100%    Intake/Output Summary (Last 24 hours) at 02/19/15 1142 Last data filed at 02/19/15 0840  Gross per 24 hour  Intake    710 ml  Output   1425 ml  Net   -715 ml    Exam:   General:  Pt is alert, not in acute distress  Cardiovascular: Regular rate and rhythm, S1/S2, no murmurs  Respiratory: Clear to auscultation bilaterally, no wheezing, no crackles, no rhonchi  Abdomen: Soft, non tender, non distended, bowel sounds present  Extremities: pulses DP and PT palpable bilaterally  Neuro: Grossly nonfocal  Data Reviewed: Basic Metabolic Panel:  Recent Labs Lab 02/14/15 1328 02/17/15 1348 02/17/15 1403 02/18/15 0450 02/19/15 0508  NA 138 138 138 138 137  K 5.1 5.5* 5.6* 5.1 5.0  CL  --  111 108 112* 107  CO2 16* 19*  --  17* 17*  GLUCOSE 97 107* 101* 102* 105*  BUN 53.1* 55* 45* 55* 59*  CREATININE 5.4* 4.93* 5.20* 5.40* 5.84*  CALCIUM 8.3* 8.9  --  8.6* 8.9  PHOS  --   --   --   --  4.6   Liver Function Tests:  Recent Labs Lab 02/14/15 1328 02/17/15 1348 02/19/15 0508  AST 10 15  --   ALT <9 10*  --   ALKPHOS 56 53  --   BILITOT 0.71 1.0  --   PROT 6.7 7.2  --   ALBUMIN 3.5 4.0 3.6   No results for input(s): LIPASE, AMYLASE in the last 168 hours. No results for input(s): AMMONIA in the last 168 hours. CBC:  Recent Labs Lab 02/14/15 1328 02/17/15 1348 02/17/15 1403 02/18/15 0450 02/19/15 0508  WBC 2.6* 2.9*  --  2.7* 3.2*  NEUTROABS 1.0* 1.7  --   --   --   HGB 7.6* 9.4* 9.2* 8.4* 9.6*  HCT 21.8* 27.0* 27.0* 24.0* 27.4*  MCV 94.4 90.6  --  90.2 91.0  PLT 100* 104*  --  101* 125*   Cardiac Enzymes: No results for input(s): CKTOTAL, CKMB, CKMBINDEX, TROPONINI in the last 168  hours. BNP: Invalid input(s): POCBNP CBG: No results for input(s): GLUCAP in the last 168 hours.  Recent Results (from the past 240 hour(s))  Urine culture     Status: None   Collection Time: 02/17/15  5:00 PM  Result Value Ref Range Status   Specimen Description URINE, CLEAN CATCH  Final   Special Requests NONE  Final   Culture   Final    NO GROWTH 1 DAY Performed at [facility]    Report Status 02/18/2015 FINAL  Final     Scheduled Meds: . finasteride  5 mg Oral Daily  . hydrALAZINE  10 mg Oral 3 times per day  . sodium chloride  3 mL Intravenous Q12H  . tamsulosin  0.4 mg Oral Daily  . warfarin  2 mg Oral q1800  . Warfarin - Pharmacist Dosing Inpatient  Does not apply q1800   Continuous Infusions:

## 2015-02-19 NOTE — Plan of Care (Signed)
Patient's B/P was 159/101.  PCP was notified.  Will continue to monitor the patient.

## 2015-02-19 NOTE — Care Management Important Message (Signed)
Important Message  Patient Details  Name: Wesley [name] MRN: [id number] Date of Birth: 1945-09-28   Medicare Important Message Given:  Yes-second notification given    Camillo Flaming 02/19/2015, 11:32 AMImportant Message  Patient Details  Name: Wesley [name] MRN: [id number] Date of Birth: [birth year]   Medicare Important Message Given:  Yes-second notification given    Camillo Flaming 02/19/2015, 11:31 AM

## 2015-02-20 ENCOUNTER — Other Ambulatory Visit: Payer: Self-pay | Admitting: Urology

## 2015-02-20 LAB — PROTIME-INR
INR: 1.13 (ref 0.00–1.49)
Prothrombin Time: 14.6 seconds (ref 11.6–15.2)

## 2015-02-20 LAB — BASIC METABOLIC PANEL
ANION GAP: 11 (ref 5–15)
ANION GAP: 10 (ref 5–15)
BUN: 53 mg/dL — AB (ref 6–20)
BUN: 54 mg/dL — AB (ref 6–20)
CALCIUM: 9.4 mg/dL (ref 8.9–10.3)
CHLORIDE: 108 mmol/L (ref 101–111)
CHLORIDE: 111 mmol/L (ref 101–111)
CO2: 17 mmol/L — ABNORMAL LOW (ref 22–32)
CO2: 19 mmol/L — ABNORMAL LOW (ref 22–32)
Calcium: 9.2 mg/dL (ref 8.9–10.3)
Creatinine, Ser: 5.06 mg/dL — ABNORMAL HIGH (ref 0.61–1.24)
Creatinine, Ser: 5.11 mg/dL — ABNORMAL HIGH (ref 0.61–1.24)
GFR calc Af Amer: 12 mL/min — ABNORMAL LOW (ref >60)
GFR calc Af Amer: 12 mL/min — ABNORMAL LOW (ref >60)
GFR, EST NON AFRICAN AMERICAN: 11 mL/min — AB (ref >60)
GFR, EST NON AFRICAN AMERICAN: 11 mL/min — AB (ref >60)
GLUCOSE: 143 mg/dL — AB (ref 65–99)
GLUCOSE: 135 mg/dL — AB (ref 65–99)
POTASSIUM: 5.3 mmol/L — AB (ref 3.5–5.1)
POTASSIUM: 5.1 mmol/L (ref 3.5–5.1)
Sodium: 136 mmol/L (ref 135–145)
Sodium: 140 mmol/L (ref 135–145)

## 2015-02-20 MED ORDER — DEXTROSE-NACL 5-0.45 % IV SOLN
INTRAVENOUS | Status: DC
  Administered 2015-02-21 – 2015-02-22 (×3): via INTRAVENOUS

## 2015-02-20 MED ORDER — HYDRALAZINE HCL 50 MG PO TABS
50.0000 mg | ORAL_TABLET | Freq: Three times a day (TID) | ORAL | Status: DC
  Administered 2015-02-20 – 2015-02-23 (×7): 50 mg via ORAL
  Filled 2015-02-20 (×11): qty 1

## 2015-02-20 NOTE — Progress Notes (Signed)
  Subjective: The patient continues to urinate and has no flank pain.  Objective: Vital signs in last 24 hours: Temp:  [98.4 F (36.9 C)-98.9 F (37.2 C)] 98.8 F (37.1 C) (10/19 0519) Pulse Rate:  [82-97] 95 (10/19 0519) Resp:  [16] 16 (10/19 0519) BP: (133-180)/(72-110) 170/110 mmHg (10/19 0959) SpO2:  [97 %-100 %] 99 % (10/19 0519) Weight:  [92.761 kg (204 lb 8 oz)] 92.761 kg (204 lb 8 oz) (10/19 0519)  Intake/Output from previous day: 10/18 0701 - 10/19 0700 In: 350 [P.O.:350] Out: 1200 [Urine:1200] Intake/Output this shift: Total I/O In: 120 [P.O.:120] Out: 900 [Urine:900]  Physical Exam:  General: Alert and oriented CV: RRR Lungs: Clear Abdomen: Soft, ND Ext: NT, No erythema  Lab Results:  Recent Labs  02/17/15 1403 02/18/15 0450 02/19/15 0508  HGB 9.2* 8.4* 9.6*  HCT 27.0* 24.0* 27.4*   BMET  Recent Labs  02/18/15 0450 02/19/15 0508  NA 138 137  K 5.1 5.0  CL 112* 107  CO2 17* 17*  GLUCOSE 102* 105*  BUN 55* 59*  CREATININE 5.40* 5.84*  CALCIUM 8.6* 8.9     Studies/Results: No results found.  Assessment/Plan: 69 y.o. male with a 300 gram prostate, previous known left hydronephrosis related to multiple myeloma and now right sided hydro with AKI. - We had another lengthy discussion of the need for intervention for the patient's hydro and AKI. The patient has agreed to bilateral stents and a foley placed in the OR. We discussed the risks and benefits of the procedure including bleeding and infection, the possibility that we can't place the stents due to anatomy, the possibility that his extrinsic compression can lead to stent failure, the possibility of injury to structures including the urethra, bladder and ureters. We discussed that if the stents are effective then they would need to be replaced every 4-6 months and that ALL ureteral stents are TEMPORARY and need to be exchanged or removed. - Prostate: we discussed that the best treatment option  for a prostate his size is open simple prostatectomy   LOS: 3 days   Christell Faith 02/20/2015, 1:19 PM

## 2015-02-20 NOTE — Progress Notes (Signed)
PT Cancellation Note  Patient Details Name: Corliss Lamartina MRN: [id number] DOB: [birth year]   Cancelled Treatment:    Reason Eval/Treat Not Completed: PT screened, no needs identified, will sign off.   Weston Anna, MPT Pager: [phone]

## 2015-02-20 NOTE — Progress Notes (Signed)
ANTICOAGULATION CONSULT NOTE - follow-up   Pharmacy Consult for Coumadin Indication: VTE prophylaxis  No Known Allergies  Patient Measurements: Height: 5\' 8"  (172.7 cm) Weight: 204 lb 8 oz (92.761 kg) IBW/kg (Calculated) : 68.4  Vital Signs: Temp: 98.8 F (37.1 C) (10/19 0519) Temp Source: Oral (10/19 0519) BP: 159/88 mmHg (10/19 0549) Pulse Rate: 95 (10/19 0519)  Labs:  Recent Labs  02/17/15 1348 02/17/15 1403 02/18/15 0450 02/19/15 0508 02/20/15 0530  HGB 9.4* 9.2* 8.4* 9.6*  --   HCT 27.0* 27.0* 24.0* 27.4*  --   PLT 104*  --  101* 125*  --   LABPROT 14.5  --   --   --  14.6  INR 1.11  --   --   --  1.13  CREATININE 4.93* 5.20* 5.40* 5.84*  --     Estimated Creatinine Clearance: 13.6 mL/min (by C-G formula based on Cr of 5.84).   Medical History: Past Medical History  Diagnosis Date  . Pulmonary nodule, right     W/  CHEST WALL MASS--  SCHEDULED FOR BIOPSY W/ DR [name] ON FRIDAY 10-27-2013  . History of seizures as a child     AGE 27 OR 8--- NONE ISSUE SINCE  . History of GI bleed     SECONDARY TO ULCER --  DEC 2011  RESOLVED PER LAST EGD 07-05-2010  . Bladder tumor   . Hematuria   . Radiation 12/07/13-12/27/13    right anterior chest 37.5 gray  . Fracture of radius 09/29/14    left  . Cancer ([place])     multiple myeloma, bladder cancer 2015    Medications:  Coumadin 2mg  po daily  Assessment: 69 yo M with multiple myeloma.  He was started on Coumadin 2mg  daily in July 2016 by Dr [name] for VTE prevention while on Revlimid, which has since been discontinued.  Patient remains on warfarin.  Called Dr. [name] 10/17 to clarify plan for warfarin and he wishes to continue warfarin at 2mg  daily (no INR monitoring or goal). INR at admission = 1.11.    SQ heparin was started 10/16 pending clarification with oncology (Stopped 10/17)  Today, 02/20/2015  CBC: Hgb low but stable, pltc remains low but improving (remains > 100)  No bleeding documented  Scr  worsening, pt declined nephrostomy tube  INR 1.13  Regular diet  No significant drug-drug intxn  Goal of Therapy:  VTE prevention (no INR goal per oncology)   Plan:   Continue warfarin 2mg  daily  INR q72h as warfarin for prophylaxis, pharmacy to follow-up on days INR are drawn  Dia Sitter, PharmD, BCPS 02/20/2015 9:33 AM

## 2015-02-20 NOTE — Progress Notes (Addendum)
Bladder scan completed, 500 cc in bladder. Patient was encouraged to void, 150 cc urine output. Follow up bladder scan completed 700 cc. Pt was encouraged to void, awaiting progress. Urologist on-call paged. Patient is not experiencing symptoms, He denies pain, pressure and or urgency to void.  Coude Catheter placement recommended, Patient is not open to treatment plan of care for urinary retention at this time. Education provided regarding benefits and risks and patient declined. Patient was encouraged to void. Pt voided an additional 150 cc. He was encouraged to schedule a void every hour.

## 2015-02-20 NOTE — Progress Notes (Signed)
Patient ID: Wesley [name], male   DOB: 1946/04/29, 69 y.o.   MRN: [id number] [facility] PROGRESS NOTE  Wesley [name] [id number]:[id number] DOB: 1945-07-10 DOA: 02/17/2015 PCP: [name], MD  Brief narrative:    69 yo male with past medical history of multiple myeloma (follows with Dr [name]), radiation to the tumor mass at the right anterior chest under the care of Dr. [name] completed 12/07/2013 through 12/27/2013, was on systemic chemotherapy with Velcade, Revlimid and Decadron, seen in cancer center 01/31/2015 at which time decision made to hold off on tx due to worsening renal failure.   Pt presented to [facility] ED with BP of 202/118 and generalized weakness without neurologic complaints. On admission, CT abdomen demonstrated bialteral hydronephrosis. In addition, his creatinine was 5.20 (on 10/13 Cr was 5.4)  Anticipated discharge: home once renal function improves.  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 [name] of oncology  - His tx with revlimid, velcade and decadron stopped  due to worsening renal failure    Anemia of chronic disease / Antineoplastic chemotherapy induced anemia / Leukopenia due to antineoplastic chemotherapy / Thrombocytopenia ([place]) - All related to sequela of chemotherapy  - Counts stable - Monitor daily CBC  General weakness - Due to MM - PT eval requested - ambulation encouraged as pt able to tolerate   DVT Prophylaxis  - On anticoagulation with coumadin  Code Status: Full.  Family Communication:  plan of care discussed with the patient and wife at bedside  Disposition Plan: Home once renal function improves.   IV access:  Peripheral IV  Procedures and diagnostic studies:    Ct Abdomen Pelvis Wo Contrast 02/17/2015 Increased moderate right hydroureteronephrosis to the level of the urinary bladder, with increased bladder dilatation. This suggests vesicoureteral reflux. Stable moderate left hydronephrosis. Retroperitoneal mass in the region of the left ureteropelvic junction has decreased in size since prior study. Stable markedly enlarged prostate gland with bladder dilatation and diffuse wall thickening, consistent with bladder outlet obstruction. Electronically Signed   By: Earle Gell M.D.   On: 02/17/2015 16:11   Dg Chest 2 View 02/17/2015   1. No acute cardiopulmonary disease. 2. Resorbed lateral right seventh rib without significant change from the prior chest CT. Electronically Signed   By: Lajean Manes M.D.   On: 02/17/2015 14:38    Medical Consultants:  Urology, Dr. Cristela Felt  Renal, Dr. Roney Jaffe  Other Consultants:  Physical therapy    IAnti-Infectives:   None   Faye Ramsay, MD  [facility] Pager [phone]  Time spent in minutes: 25  minutes  If 7PM-7AM, please contact night-coverage [email or web address] Password [email or web address] 02/20/2015, 2:26 PM   LOS: 3 days    HPI/Subjective: No acute overnight events. Patient reports feeling better today.   Objective: Filed Vitals:   02/20/15 0418  02/20/15 0519 02/20/15 0549 02/20/15 0959  BP:   159/88 170/110  Pulse: 96 95    Temp: 98.4 F (36.9 C) 98.8 F (37.1 C)    TempSrc: Oral Oral    Resp:  16    Height:      Weight:  92.761 kg (204 lb 8 oz)    SpO2: 99% 99%      Intake/Output Summary (Last 24 hours) at 02/20/15 1426 Last data filed at 02/20/15 0937  Gross per 24 hour  Intake    240 ml  Output   1200 ml  Net   -960 ml    Exam:   General:  Pt is alert, not in acute distress  Cardiovascular: Regular rate and rhythm, S1/S2, no murmurs  Respiratory: Clear to auscultation bilaterally, no wheezing, no crackles, no rhonchi  Abdomen: Soft, non tender, non distended, bowel sounds present  Data Reviewed: Basic Metabolic Panel:  Recent Labs Lab 02/14/15 1328 02/17/15 1348 02/17/15 1403 02/18/15 0450 02/19/15 0508 02/20/15 1345  NA 138 138 138 138 137 136  K 5.1 5.5* 5.6* 5.1 5.0 5.3*  CL  --  111 108 112* 107 108  CO2 16* 19*  --  17* 17* 17*  GLUCOSE 97 107* 101* 102* 105* 143*  BUN 53.1* 55* 45* 55* 59* 53*  CREATININE 5.4* 4.93* 5.20* 5.40* 5.84* 5.06*  CALCIUM 8.3* 8.9  --  8.6* 8.9 9.2  PHOS  --   --   --   --  4.6  --    Liver Function Tests:  Recent Labs Lab 02/14/15 1328 02/17/15 1348 02/19/15 0508  AST 10 15  --   ALT <9 10*  --   ALKPHOS 56 53  --   BILITOT 0.71 1.0  --   PROT 6.7 7.2  --   ALBUMIN 3.5 4.0 3.6   CBC:  Recent Labs Lab 02/14/15 1328 02/17/15 1348 02/17/15 1403 02/18/15 0450 02/19/15 0508  WBC 2.6* 2.9*  --  2.7* 3.2*  NEUTROABS 1.0* 1.7  --   --   --   HGB 7.6* 9.4* 9.2* 8.4* 9.6*  HCT 21.8* 27.0* 27.0* 24.0* 27.4*  MCV 94.4 90.6  --  90.2 91.0  PLT 100* 104*  --  101* 125*   Recent Results (from the past 240 hour(s))  Urine culture     Status: None   Collection Time: 02/17/15  5:00 PM  Result Value Ref Range Status   Specimen Description URINE, CLEAN CATCH  Final   Special Requests NONE  Final   Culture   Final    NO GROWTH 1 DAY Performed at [facility]    Report Status 02/18/2015 FINAL  Final     Scheduled Meds: . finasteride  5 mg Oral Daily  . hydrALAZINE  25 mg Oral 3 times per day  . sodium chloride  3 mL Intravenous Q12H  . tamsulosin  0.4 mg Oral Daily  . warfarin  2 mg Oral q1800  . Warfarin - Pharmacist Dosing Inpatient   Does not apply q1800   Continuous Infusions: . [START ON 02/21/2015] dextrose 5 % and 0.45% NaCl

## 2015-02-21 ENCOUNTER — Other Ambulatory Visit: Payer: Medicare HMO

## 2015-02-21 ENCOUNTER — Inpatient Hospital Stay (HOSPITAL_COMMUNITY): Payer: Medicare HMO | Admitting: Registered Nurse

## 2015-02-21 ENCOUNTER — Ambulatory Visit: Payer: Medicare HMO

## 2015-02-21 ENCOUNTER — Encounter (HOSPITAL_COMMUNITY): Payer: Self-pay | Admitting: Certified Registered Nurse Anesthetist

## 2015-02-21 ENCOUNTER — Encounter (HOSPITAL_COMMUNITY)
Admission: EM | Disposition: A | Discharge status: Home / Self Care | Payer: Self-pay | Source: Home / Self Care | Attending: Internal Medicine

## 2015-02-21 HISTORY — PX: CYSTOSCOPY WITH RETROGRADE PYELOGRAM, URETEROSCOPY AND STENT PLACEMENT: SHX5789

## 2015-02-21 LAB — BASIC METABOLIC PANEL
ANION GAP: 10 (ref 5–15)
BUN: 52 mg/dL — ABNORMAL HIGH (ref 6–20)
CHLORIDE: 113 mmol/L — AB (ref 101–111)
CO2: 18 mmol/L — AB (ref 22–32)
CREATININE: 4.83 mg/dL — AB (ref 0.61–1.24)
Calcium: 9.1 mg/dL (ref 8.9–10.3)
GFR calc Af Amer: 13 mL/min — ABNORMAL LOW (ref >60)
GFR calc non Af Amer: 11 mL/min — ABNORMAL LOW (ref >60)
GLUCOSE: 121 mg/dL — AB (ref 65–99)
Potassium: 5.2 mmol/L — ABNORMAL HIGH (ref 3.5–5.1)
Sodium: 141 mmol/L (ref 135–145)

## 2015-02-21 LAB — SURGICAL PCR SCREEN
MRSA, PCR: NEGATIVE
STAPHYLOCOCCUS AUREUS: POSITIVE — AB

## 2015-02-21 LAB — PSA: PSA: 5.05 ng/mL — ABNORMAL HIGH (ref 0.00–4.00)

## 2015-02-21 LAB — CBC
HEMATOCRIT: 27.3 % — AB (ref 39.0–52.0)
Hemoglobin: 9.4 g/dL — ABNORMAL LOW (ref 13.0–17.0)
MCH: 32.3 pg (ref 26.0–34.0)
MCHC: 34.4 g/dL (ref 30.0–36.0)
MCV: 93.8 fL (ref 78.0–100.0)
Platelets: 135 10*3/uL — ABNORMAL LOW (ref 150–400)
RBC: 2.91 MIL/uL — AB (ref 4.22–5.81)
RDW: 16.8 % — ABNORMAL HIGH (ref 11.5–15.5)
WBC: 3.2 10*3/uL — AB (ref 4.0–10.5)

## 2015-02-21 SURGERY — CYSTOURETEROSCOPY, WITH RETROGRADE PYELOGRAM AND STENT INSERTION
Anesthesia: General

## 2015-02-21 MED ORDER — MIDAZOLAM HCL 2 MG/2ML IJ SOLN
INTRAMUSCULAR | Status: AC
  Filled 2015-02-21: qty 4

## 2015-02-21 MED ORDER — DEXAMETHASONE SODIUM PHOSPHATE 10 MG/ML IJ SOLN
INTRAMUSCULAR | Status: AC
  Filled 2015-02-21: qty 1

## 2015-02-21 MED ORDER — LACTATED RINGERS IV SOLN
INTRAVENOUS | Status: DC
  Administered 2015-02-21: 1000 mL via INTRAVENOUS

## 2015-02-21 MED ORDER — FENTANYL CITRATE (PF) 100 MCG/2ML IJ SOLN: 25.0000 ug | INTRAMUSCULAR | Status: DC | PRN

## 2015-02-21 MED ORDER — ONDANSETRON HCL 4 MG/2ML IJ SOLN: 4.0000 mg | Freq: Four times a day (QID) | INTRAMUSCULAR | Status: DC | PRN

## 2015-02-21 MED ORDER — SODIUM CHLORIDE 0.9 % IV SOLN
INTRAVENOUS | Status: DC
  Administered 2015-02-21: 1000 mL via INTRAVENOUS

## 2015-02-21 MED ORDER — IOHEXOL 300 MG/ML  SOLN
INTRAMUSCULAR | Status: DC | PRN
  Administered 2015-02-21: 10 mL

## 2015-02-21 MED ORDER — EPHEDRINE SULFATE 50 MG/ML IJ SOLN
INTRAMUSCULAR | Status: DC | PRN
  Administered 2015-02-21: 5 mg via INTRAVENOUS

## 2015-02-21 MED ORDER — SODIUM CHLORIDE 0.9 % IR SOLN
Status: DC | PRN
  Administered 2015-02-21: 3000 mL

## 2015-02-21 MED ORDER — DEXAMETHASONE SODIUM PHOSPHATE 10 MG/ML IJ SOLN
INTRAMUSCULAR | Status: DC | PRN
Start: 2015-02-21 — End: 2015-02-21
  Administered 2015-02-21: 10 mg via INTRAVENOUS

## 2015-02-21 MED ORDER — OXYCODONE HCL 5 MG PO TABS: 5.0000 mg | ORAL_TABLET | Freq: Once | ORAL | Status: DC | PRN

## 2015-02-21 MED ORDER — METHYLENE BLUE 1 % INJ SOLN
INTRAMUSCULAR | Status: AC
  Filled 2015-02-21: qty 10

## 2015-02-21 MED ORDER — HYDROCODONE-ACETAMINOPHEN 5-325 MG PO TABS: 1.0000 | ORAL_TABLET | ORAL | Status: DC | PRN

## 2015-02-21 MED ORDER — CIPROFLOXACIN IN D5W 400 MG/200ML IV SOLN
INTRAVENOUS | Status: DC | PRN
  Administered 2015-02-21: 400 mg via INTRAVENOUS

## 2015-02-21 MED ORDER — LIDOCAINE HCL (CARDIAC) 20 MG/ML IV SOLN
INTRAVENOUS | Status: AC
  Filled 2015-02-21: qty 5

## 2015-02-21 MED ORDER — PROPOFOL 10 MG/ML IV BOLUS
INTRAVENOUS | Status: DC | PRN
  Administered 2015-02-21: 180 mg via INTRAVENOUS

## 2015-02-21 MED ORDER — CIPROFLOXACIN IN D5W 400 MG/200ML IV SOLN
INTRAVENOUS | Status: AC
  Filled 2015-02-21: qty 200

## 2015-02-21 MED ORDER — PROPOFOL 10 MG/ML IV BOLUS
INTRAVENOUS | Status: AC
  Filled 2015-02-21: qty 20

## 2015-02-21 MED ORDER — FENTANYL CITRATE (PF) 100 MCG/2ML IJ SOLN
INTRAMUSCULAR | Status: DC | PRN
  Administered 2015-02-21: 25 ug via INTRAVENOUS
  Administered 2015-02-21 (×2): 50 ug via INTRAVENOUS
  Administered 2015-02-21: 25 ug via INTRAVENOUS

## 2015-02-21 MED ORDER — PHENYLEPHRINE HCL 10 MG/ML IJ SOLN
INTRAMUSCULAR | Status: DC | PRN
  Administered 2015-02-21: 80 ug via INTRAVENOUS
  Administered 2015-02-21 (×2): 40 ug via INTRAVENOUS

## 2015-02-21 MED ORDER — OXYCODONE HCL 5 MG/5ML PO SOLN: 5.0000 mg | Freq: Once | ORAL | Status: DC | PRN

## 2015-02-21 MED ORDER — MIDAZOLAM HCL 5 MG/5ML IJ SOLN
INTRAMUSCULAR | Status: DC | PRN
  Administered 2015-02-21: 2 mg via INTRAVENOUS

## 2015-02-21 MED ORDER — ONDANSETRON HCL 4 MG/2ML IJ SOLN
INTRAMUSCULAR | Status: DC | PRN
  Administered 2015-02-21: 4 mg via INTRAVENOUS

## 2015-02-21 MED ORDER — CIPROFLOXACIN IN D5W 400 MG/200ML IV SOLN: 400.0000 mg | INTRAVENOUS | Status: DC

## 2015-02-21 MED ORDER — ONDANSETRON HCL 4 MG/2ML IJ SOLN
INTRAMUSCULAR | Status: AC
  Filled 2015-02-21: qty 2

## 2015-02-21 MED ORDER — FENTANYL CITRATE (PF) 250 MCG/5ML IJ SOLN
INTRAMUSCULAR | Status: AC
  Filled 2015-02-21: qty 25

## 2015-02-21 MED ORDER — LIDOCAINE HCL (CARDIAC) 10 MG/ML IV SOLN
INTRAVENOUS | Status: DC | PRN
  Administered 2015-02-21: 50 mg via INTRAVENOUS

## 2015-02-21 SURGICAL SUPPLY — 25 items
BAG URINE DRAINAGE (UROLOGICAL SUPPLIES) ×2 IMPLANT
BAG URO CATCHER STRL LF (DRAPE) ×3 IMPLANT
BASKET LASER NITINOL 1.9FR (BASKET) IMPLANT
BASKET STNLS GEMINI 4WIRE 3FR (BASKET) IMPLANT
BASKET ZERO TIP NITINOL 2.4FR (BASKET) IMPLANT
BRUSH URET BIOPSY 3F (UROLOGICAL SUPPLIES) IMPLANT
BSKT STON RTRVL 120 1.9FR (BASKET)
BSKT STON RTRVL GEM 120X11 3FR (BASKET)
BSKT STON RTRVL ZERO TP 2.4FR (BASKET)
CATH INTERMIT  6FR 70CM (CATHETERS) IMPLANT
CATH TIEMANN FOLEY 18FR 5CC (CATHETERS) ×2 IMPLANT
CATH URET 5FR 28IN CONE TIP (BALLOONS) ×2
CATH URET 5FR 70CM CONE TIP (BALLOONS) IMPLANT
CLOTH BEACON ORANGE TIMEOUT ST (SAFETY) ×3 IMPLANT
GLOVE BIOGEL M STRL SZ7.5 (GLOVE) ×3 IMPLANT
GOWN STRL REUS W/TWL XL LVL3 (GOWN DISPOSABLE) ×3 IMPLANT
GUIDEWIRE ANG ZIPWIRE 038X150 (WIRE) IMPLANT
GUIDEWIRE STR DUAL SENSOR (WIRE) ×2 IMPLANT
KIT BALLIN UROMAX 15FX10 (LABEL) IMPLANT
KIT BALLN UROMAX 15FX4 (MISCELLANEOUS) IMPLANT
KIT BALLN UROMAX 26 75X4 (MISCELLANEOUS)
PACK CYSTO (CUSTOM PROCEDURE TRAY) ×6 IMPLANT
SET HIGH PRES BAL DIL (LABEL)
SHEATH ACCESS URETERAL 24CM (SHEATH) IMPLANT
SHEATH ACCESS URETERAL 54CM (SHEATH) IMPLANT

## 2015-02-21 NOTE — Op Note (Signed)
Preoperative diagnosis: BPH, elevated PSA, incomplete bladder emptying, right hydronephrosis, left hydronephrosis, acute renal failure Postoperative diagnosis: Same  Procedure: Exam under anesthesia, Cystoscopy, Foley catheter placement  Surgeon: Junious Silk  Anesthesia: Gen.  Indication of procedure: 69 year old with multiple myeloma, a 250-300 g prostate, right hydronephrosis presumed to be caused by incomplete bladder emptying, left hydronephrosis caused by multiple myeloma plasma cell mass with acute renal failure. Given the bilateral hydronephrosis I discussed with him the nature risk benefits and alternatives to cystoscopy with attempted stent placement. We discussed alternatives such as nephrostomy tubes for continued surveillance. Patient wanted to attempt stents. I told him a might not be able to locate the ureteral orifices without something like a TURP or open simple prostatectomy but  declined any intervention on the prostate. He was worried about "control problems" with his bladder after surgery. I thought it was worth an attempt to look for the ureteral orifices as Dr. Jasmine December  thought he located the ureteral orifices last year as described in his op note. I discussed all this with the patient. Patient had also declined Foley catheter placement was agreeable to having it placed under anesthesia.  Findings: On exam under anesthesia the prostate was enlarged but palpably normal on digital rectal exam. All landmarks were preserved. There were no hard areas or nodules. On examine cystoscopy I believe a lot of his prostate volume and issues are due to the large intravesical median lobe component. Also the penis was circumcised and normal without lesion in the testicles were descended bilaterally and palpably normal.  On cystoscopy the urethra appeared normal, the prostatic urethra was elongated and the lateral lobes were actually separated quite nicely. We'll was obstructing his prostatic  urethra was a large intravesical median lobe. The bladder was trabeculated rather severely. I did not appreciate any stones, foreign bodies or tumors in the bladder. 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 Jasmine December had done his resection and fulguration last year. I do believe it might be possible to do a transurethral incision of the prostate in size the median lobe and get down near the bladder neck possibly located the UOs and perform a partial TURP on the median lobe only.  An 5 French coud catheter went rather easily. Will monitor urine output overnight. Is indeed his right kidney was obstructed from obstruction he may diurese and improve his kidney function with a Foley.  Description of procedure: After consent was obtained patient to the operating room. After adequate anesthesia he is placed in lithotomy position and prepped and draped in usual sterile fashion. A timeout was performed to confirm the patient and procedure. An exam under anesthesia was performed. The cystoscope was then passed per urethra and the bladder inspected. I used a 5 [place] open-ended catheter and cone tip catheter to try to press the median lobe down look for potential ureteral orifices and inject contrast. I was never able to get any contrast to go retrograde. There are many possibilities. I could see fluid mixing with the bladder and contrast but never saw any true  streams of efflux. on fluoroscopy imaging after I injected some contrast I could tell the tip of the scope was well up over the mid sacrum as it just entered the bladder indicating the long length of the median lobe and that the trigone was likely several centimeters below this. I thought about giving methylene blue but pharmacy reported it was to be used with extreme caution in patients with kidney  failure given potential for tissue staining and hypertension. Given that the patient was admitted with hypertensive crisis I elected not to  give methylene blue. The prostate did not bleed. The scope was removed and a coud catheter was advanced without difficulty. He was left gravity drainage with clear urine. The patient was awakened and taken to recovery room in stable condition.   Complications: None   Blood loss: Minimal   Specimens: None   Drains: 44 French coud catheter   Disposition: Patient stable to PACU

## 2015-02-21 NOTE — Care Management Note (Signed)
Case Management Note  Patient Details  Name: Wesley [name] MRN: [id number] Date of Birth: [birth year]  Subjective/Objective: bilateral hydronephrosis, for cysto/stent.tachycardia.ivf,sx,tele.From home.                  Action/Plan:d/c plan home.   Expected Discharge Date:                  Expected Discharge Plan:  Home/Self Care  In-House Referral:     Discharge planning Services  CM Consult  Post Acute Care Choice:    Choice offered to:     DME Arranged:    DME Agency:     HH Arranged:    HH Agency:     Status of Service:  In process, will continue to follow  Medicare Important Message Given:  Yes-second notification given Date Medicare IM Given:    Medicare IM give by:    Date Additional Medicare IM Given:    Additional Medicare Important Message give by:     If discussed at [place] of Stay Meetings, dates discussed:    Additional Comments:  Dessa Phi, RN 02/21/2015, 3:32 PM

## 2015-02-21 NOTE — Progress Notes (Signed)
Patient ID: Wesley [name], male   DOB: Jul 11, 1945, 69 y.o.   MRN: [id number] [facility] PROGRESS NOTE  Wesley [name] TLX:[id number] DOB: 07-17-1945 DOA: 02/17/2015 PCP: Wesley Cowden, MD  Brief narrative:    69 yo male with past medical history of multiple myeloma (follows with Dr Wesley [name]), radiation to the tumor mass at the right anterior chest under the care of Dr. Sondra [name] completed 12/07/2013 through 12/27/2013, was on systemic chemotherapy with Velcade, Revlimid and Decadron, seen in cancer center 01/31/2015 at which time decision made to hold off on tx due to worsening renal failure.   Pt presented to [facility] ED with BP of 202/118 and generalized weakness without neurologic complaints. On admission, CT abdomen demonstrated bialteral hydronephrosis. In addition, his creatinine was 5.20 (on 10/13 Cr was 5.4)  Anticipated discharge: home once renal function improves.  Assessment/Plan:    Principal Problem: Accelerated hypertension  - Blood pressure 161/96 - Increased hydralazine to 50 mg PO Q 8 hours  - Continue to monitor on telemetry as pt still with mild tachycardia  - pt denies chest pain   Active Problems: Bilateral hydronephrosis - CT abdomen on admission demonstrated bilateral hydronephrosis - GU has seen the pt in consultation and has discussed alternatives such as bilateral nephrostomy tubes - address this again with pt and his wife and pt now in agreement to proceed  - continue Flomax and Proscar  CKD (chronic kidney disease) stage 5, GFR less than 15 ml/min (HCC) / Myeloma kidney (Wesley [name]) - Worsening renal failure likely deu to myeloma kidney and bilateral hydronephrosis - Renal function better this AM - will repeat BMP in AM  Metabolic acidosis - Secondary to CKD - BMP in AM  Hyperkalemia - Due to metabolic acidosis  - high this AM, repeat BMP in AM  Multiple myeloma (Wesley [name]) /  Kappa light chain myeloma (Wesley [name]) - Follows with Dr. Earlie [name] of oncology  - His  tx with revlimid, velcade and decadron stopped due to worsening renal failure    Anemia of chronic disease / Antineoplastic chemotherapy induced anemia / Leukopenia due to antineoplastic chemotherapy / Thrombocytopenia (Wesley [name]) - All related to sequela of chemotherapy  - Counts stable - Monitor daily CBC  General weakness - Due to MM - ambulation encouraged as pt able to tolerate   DVT Prophylaxis  - On anticoagulation with coumadin  Code Status: Full.  Family Communication:  plan of care discussed with the patient and wife at bedside  Disposition Plan: Home once renal function improves.   IV access:  Peripheral IV  Procedures and diagnostic studies:    Ct Abdomen Pelvis Wo Contrast 02/17/2015 Increased moderate right hydroureteronephrosis to the level of the urinary bladder, with increased bladder dilatation. This suggests vesicoureteral reflux. Stable moderate left hydronephrosis. Retroperitoneal mass in the region of the left ureteropelvic junction has decreased in size since prior study. Stable markedly enlarged prostate gland with bladder dilatation and diffuse wall thickening, consistent with bladder outlet obstruction. Electronically Signed   By: Wesley [name] M.D.   On: 02/17/2015 16:11   Dg Chest 2 View 02/17/2015   1. No acute cardiopulmonary disease. 2. Resorbed lateral right seventh rib without significant change from the prior chest CT. Electronically Signed   By: Wesley [name] M.D.   On: 02/17/2015 14:38    Medical Consultants:  Urology, Dr. Cristela [name]  Renal, Dr. Roney [name]  Other Consultants:  Physical therapy    IAnti-Infectives:   None   Wesley Ramsay, MD  [facility] Pager  [phone]  Time spent in minutes: 25 minutes  If 7PM-7AM, please contact night-coverage [email or web address] Password [facility] 02/21/2015, 6:44 AM   LOS: 4 days    HPI/Subjective: No acute overnight events. Patient reports feeling better today.   Objective: Filed  Vitals:   02/20/15 0959 02/20/15 1433 02/20/15 2122 02/21/15 0436  BP: 170/110 160/82 152/87 148/82  Pulse:  87 102 94  Temp:  98.4 F (36.9 C) 99.3 F (37.4 C) 99.3 F (37.4 C)  TempSrc:  Oral Oral Oral  Resp:  $Remo'16 18 18  'PnzBM$ Height:      Weight:      SpO2:  97% 99% 99%    Intake/Output Summary (Last 24 hours) at 02/21/15 0644 Last data filed at 02/21/15 0540  Gross per 24 hour  Intake    840 ml  Output   2625 ml  Net  -1785 ml    Exam:   General:  Pt is alert, not in acute distress  Cardiovascular: Regular rate and rhythm, S1/S2, no murmurs  Respiratory: Clear to auscultation bilaterally, no wheezing, no crackles, no rhonchi  Abdomen: Soft, non tender, non distended, bowel sounds present  Data Reviewed: Basic Metabolic Panel:  Recent Labs Lab 02/17/15 1348 02/17/15 1403 02/18/15 0450 02/19/15 0508 02/20/15 1345 02/20/15 1712  NA 138 138 138 137 136 140  K 5.5* 5.6* 5.1 5.0 5.3* 5.1  CL 111 108 112* 107 108 111  CO2 19*  --  17* 17* 17* 19*  GLUCOSE 107* 101* 102* 105* 143* 135*  BUN 55* 45* 55* 59* 53* 54*  CREATININE 4.93* 5.20* 5.40* 5.84* 5.06* 5.11*  CALCIUM 8.9  --  8.6* 8.9 9.2 9.4  PHOS  --   --   --  4.6  --   --    Liver Function Tests:  Recent Labs Lab 02/14/15 1328 02/17/15 1348 02/19/15 0508  AST 10 15  --   ALT <9 10*  --   ALKPHOS 56 53  --   BILITOT 0.71 1.0  --   PROT 6.7 7.2  --   ALBUMIN 3.5 4.0 3.6   CBC:  Recent Labs Lab 02/14/15 1328 02/17/15 1348 02/17/15 1403 02/18/15 0450 02/19/15 0508 02/21/15 0552  WBC 2.6* 2.9*  --  2.7* 3.2* 3.2*  NEUTROABS 1.0* 1.7  --   --   --   --   HGB 7.6* 9.4* 9.2* 8.4* 9.6* 9.4*  HCT 21.8* 27.0* 27.0* 24.0* 27.4* 27.3*  MCV 94.4 90.6  --  90.2 91.0 93.8  PLT 100* 104*  --  101* 125* 135*   Recent Results (from the past 240 hour(s))  Urine culture     Status: None   Collection Time: 02/17/15  5:00 PM  Result Value Ref Range Status   Specimen Description URINE, CLEAN CATCH  Final    Special Requests NONE  Final   Culture   Final    NO GROWTH 1 DAY Performed at [facility]    Report Status 02/18/2015 FINAL  Final     Scheduled Meds: . finasteride  5 mg Oral Daily  . hydrALAZINE  50 mg Oral 3 times per day  . sodium chloride  3 mL Intravenous Q12H  . tamsulosin  0.4 mg Oral Daily  . warfarin  2 mg Oral q1800  . Warfarin - Pharmacist Dosing Inpatient   Does not apply q1800   Continuous Infusions: . dextrose 5 % and 0.45% NaCl 100 mL/hr at 02/21/15 0120

## 2015-02-21 NOTE — Anesthesia Postprocedure Evaluation (Signed)
Anesthesia Post Note  Patient: Wesley [name]  Procedure(s) Performed: Procedure(s) (LRB): CYSTOSCOPY, EXAM UNDER ANESTHESIA, FOLEY CATHETER PLACEMENT (N/A)  Anesthesia type: General  Patient location: PACU  Post pain: Pain level controlled and Adequate analgesia  Post assessment: Post-op Vital signs reviewed, Patient's Cardiovascular Status Stable, Respiratory Function Stable, Patent Airway and Pain level controlled  Last Vitals:  Filed Vitals:   02/21/15 1600  BP: 154/98  Pulse: 104  Temp:   Resp: 16    Post vital signs: Reviewed and stable  Level of consciousness: awake, alert  and oriented  Complications: No apparent anesthesia complications

## 2015-02-21 NOTE — Anesthesia Procedure Notes (Signed)
Procedure Name: LMA Insertion Date/Time: 02/21/2015 3:05 PM Performed by: Deliah Boston Pre-anesthesia Checklist: Patient identified, Emergency Drugs available, Suction available and Patient being monitored Patient Re-evaluated:Patient Re-evaluated prior to inductionOxygen Delivery Method: Circle system utilized Preoxygenation: Pre-oxygenation with 100% oxygen Intubation Type: IV induction Ventilation: Mask ventilation without difficulty LMA: LMA inserted LMA Size: 5.0 Number of attempts: 1 Placement Confirmation: positive ETCO2 and breath sounds checked- equal and bilateral Tube secured with: Tape Dental Injury: Teeth and Oropharynx as per pre-operative assessment

## 2015-02-21 NOTE — Progress Notes (Signed)
Patient without complaint. Seen in pre-op.  PE: NAD A&Ox3  PVR was 500 - 700  But pt voided about 300.   PSA 5  BMET    Component Value Date/Time   NA 141 02/21/2015 0552   NA 138 02/14/2015 1328   K 5.2* 02/21/2015 0552   K 5.1 02/14/2015 1328   CL 113* 02/21/2015 0552   CO2 18* 02/21/2015 0552   CO2 16* 02/14/2015 1328   GLUCOSE 121* 02/21/2015 0552   GLUCOSE 97 02/14/2015 1328   BUN 52* 02/21/2015 0552   BUN 53.1* 02/14/2015 1328   CREATININE 4.83* 02/21/2015 0552   CREATININE 5.4* 02/14/2015 1328   CALCIUM 9.1 02/21/2015 0552   CALCIUM 8.3* 02/14/2015 1328   GFRNONAA 11* 02/21/2015 0552   GFRAA 13* 02/21/2015 0552   A - BPH Elevated PSA Bilateral Hydronephrosis Acute on Chronic RF MM  P- I discussed with the patient the nature, potential benefits, risks and alternatives to cysto, RGP, stents, including side effects of the proposed treatment, the likelihood of the patient achieving the goals of the procedure, and any potential problems that might occur during the procedure or recuperation. Discussed failure to gain RG access, risk of bleeding, infection among others. Discussed catheter placement while asleep to follow UOP. I wanted to not we discussed yesterday QOL with stents (need to be changed periodically, stent pain, bleeding, infection). All questions answered. Patient elects to proceed.

## 2015-02-21 NOTE — Anesthesia Preprocedure Evaluation (Signed)
Anesthesia Evaluation  Patient identified by MRN, date of birth, ID band Patient awake    Reviewed: Allergy & Precautions, NPO status , Patient's Chart, lab work & pertinent test results  Airway Mallampati: II   Neck ROM: full    Dental   Pulmonary former smoker,    breath sounds clear to auscultation       Cardiovascular hypertension,  Rhythm:regular Rate:Normal     Neuro/Psych    GI/Hepatic   Endo/Other  obese  Renal/GU Renal InsufficiencyRenal diseaseBladder CA.     Musculoskeletal   Abdominal   Peds  Hematology  (+) Blood dyscrasia, anemia ,   Anesthesia Other Findings   Reproductive/Obstetrics                             Anesthesia Physical Anesthesia Plan  ASA: III  Anesthesia Plan: General   Post-op Pain Management:    Induction: Intravenous  Airway Management Planned: LMA  Additional Equipment:   Intra-op Plan:   Post-operative Plan:   Informed Consent: I have reviewed the patients History and Physical, chart, labs and discussed the procedure including the risks, benefits and alternatives for the proposed anesthesia with the patient or authorized representative who has indicated his/her understanding and acceptance.     Plan Discussed with: CRNA, Anesthesiologist and Surgeon  Anesthesia Plan Comments:         Anesthesia Quick Evaluation

## 2015-02-21 NOTE — Transfer of Care (Signed)
Immediate Anesthesia Transfer of Care Note  Patient: Wesley [name]  Procedure(s) Performed: Procedure(s): CYSTOSCOPY, EXAM UNDER ANESTHESIA, FOLEY CATHETER PLACEMENT (N/A)  Patient Location: PACU  Anesthesia Type:General  Level of Consciousness: Patient easily awoken, sedated, comfortable, cooperative, following commands, responds to stimulation.   Airway & Oxygen Therapy: Patient spontaneously breathing, ventilating well, oxygen via simple oxygen mask.  Post-op Assessment: Report given to PACU RN, vital signs reviewed and stable, moving all extremities.   Post vital signs: Reviewed and stable.  Complications: No apparent anesthesia complications

## 2015-02-22 ENCOUNTER — Encounter (HOSPITAL_COMMUNITY): Payer: Self-pay | Admitting: Urology

## 2015-02-22 LAB — BASIC METABOLIC PANEL
ANION GAP: 8 (ref 5–15)
ANION GAP: 11 (ref 5–15)
BUN: 45 mg/dL — ABNORMAL HIGH (ref 6–20)
BUN: 46 mg/dL — ABNORMAL HIGH (ref 6–20)
CALCIUM: 9.1 mg/dL (ref 8.9–10.3)
CALCIUM: 9.4 mg/dL (ref 8.9–10.3)
CO2: 18 mmol/L — ABNORMAL LOW (ref 22–32)
CO2: 18 mmol/L — ABNORMAL LOW (ref 22–32)
CREATININE: 3.95 mg/dL — AB (ref 0.61–1.24)
Chloride: 113 mmol/L — ABNORMAL HIGH (ref 101–111)
Chloride: 109 mmol/L (ref 101–111)
Creatinine, Ser: 4.03 mg/dL — ABNORMAL HIGH (ref 0.61–1.24)
GFR calc non Af Amer: 14 mL/min — ABNORMAL LOW (ref >60)
GFR, EST AFRICAN AMERICAN: 16 mL/min — AB (ref >60)
GFR, EST AFRICAN AMERICAN: 17 mL/min — AB (ref >60)
GFR, EST NON AFRICAN AMERICAN: 14 mL/min — AB (ref >60)
Glucose, Bld: 156 mg/dL — ABNORMAL HIGH (ref 65–99)
Glucose, Bld: 135 mg/dL — ABNORMAL HIGH (ref 65–99)
POTASSIUM: 6.4 mmol/L — AB (ref 3.5–5.1)
POTASSIUM: 4.8 mmol/L (ref 3.5–5.1)
SODIUM: 138 mmol/L (ref 135–145)
Sodium: 139 mmol/L (ref 135–145)

## 2015-02-22 LAB — CBC WITH DIFFERENTIAL/PLATELET
BASOS ABS: 0 10*3/uL (ref 0.0–0.1)
BASOS PCT: 0 %
EOS PCT: 0 %
Eosinophils Absolute: 0 10*3/uL (ref 0.0–0.7)
HCT: 27.9 % — ABNORMAL LOW (ref 39.0–52.0)
Hemoglobin: 9.3 g/dL — ABNORMAL LOW (ref 13.0–17.0)
LYMPHS PCT: 7 %
Lymphs Abs: 0.3 10*3/uL — ABNORMAL LOW (ref 0.7–4.0)
MCH: 31.3 pg (ref 26.0–34.0)
MCHC: 33.3 g/dL (ref 30.0–36.0)
MCV: 93.9 fL (ref 78.0–100.0)
MONO ABS: 0.3 10*3/uL (ref 0.1–1.0)
Monocytes Relative: 9 %
NEUTROS ABS: 2.9 10*3/uL (ref 1.7–7.7)
Neutrophils Relative %: 84 %
PLATELETS: 141 10*3/uL — AB (ref 150–400)
RBC: 2.97 MIL/uL — AB (ref 4.22–5.81)
RDW: 16.9 % — AB (ref 11.5–15.5)
WBC: 3.5 10*3/uL — AB (ref 4.0–10.5)

## 2015-02-22 MED ORDER — WARFARIN SODIUM 2 MG PO TABS
2.0000 mg | ORAL_TABLET | Freq: Every day | ORAL | Status: DC
Start: 2015-02-22 — End: 2015-02-23
  Administered 2015-02-22: 2 mg via ORAL
  Filled 2015-02-22 (×2): qty 1

## 2015-02-22 MED ORDER — SODIUM POLYSTYRENE SULFONATE 15 GM/60ML PO SUSP
30.0000 g | Freq: Once | ORAL | Status: AC
  Administered 2015-02-22: 30 g via ORAL
  Filled 2015-02-22: qty 120

## 2015-02-22 NOTE — Progress Notes (Addendum)
  Pt without complaint. Not bothered by foley.   Filed Vitals:   02/22/15 0605  BP: 130/71  Pulse: 96  Temp: 98.4 F (36.9 C)  Resp: 18     Intake/Output Summary (Last 24 hours) at 02/22/15 1229 Last data filed at 02/22/15 1002  Gross per 24 hour  Intake   1480 ml  Output   3850 ml  Net  -2370 ml    PE: NAD Urine clear  I spoke with him about the OR findings and not being able to locate the UO's. Again, we discussed the role of Nx tubes and an open simple prostatectomy but he declines. He is agreeable to continue foley catheter and follow UOP and Cr.   A/p -   ARF - improving Right hydro from presumed urinary retention and large prostate (~300 g) - s/p foley placement. Copious UOP. Cr decreasing.  Left hydro - from RP plasma cell involvement. Improved on recent CT. BPH - cont tamsulosin and finasteride Incomplete bladder emptying - continue foley. Would anticipate leaving in at discharge with f/u in office next 1 - 2 weeks for void trial and Cr recheck.

## 2015-02-22 NOTE — Care Management Important Message (Signed)
Important Message  Patient Details  Name: Wesley [name] MRN: [id number] Date of Birth: 07-10-1945   Medicare Important Message Given:  Yes-third notification given    Camillo Flaming 02/22/2015, 10:03 AMImportant Message  Patient Details  Name: Wesley [name] MRN: [id number] Date of Birth: 1946/02/22   Medicare Important Message Given:  Yes-third notification given    Camillo Flaming 02/22/2015, 10:03 AM

## 2015-02-22 NOTE — Progress Notes (Signed)
Patient ID: Wesley [name], male   DOB: 1946/02/17, 69 y.o.   MRN: [id number] [facility] PROGRESS NOTE  Wesley [name] RKY:[id number] DOB: 03-Feb-1946 DOA: 02/17/2015 PCP: [name], MD  Brief narrative:    69 yo male with past medical history of multiple myeloma (follows with Dr [name]), radiation to the tumor mass at the right anterior chest under the care of Dr. [name] completed 12/07/2013 through 12/27/2013, was on systemic chemotherapy with Velcade, Revlimid and Decadron, seen in cancer center 01/31/2015 at which time decision made to hold off on tx due to worsening renal failure.   Pt presented to [facility] ED with BP of 202/118 and generalized weakness without neurologic complaints. On admission, CT abdomen demonstrated bialteral hydronephrosis. In addition, his creatinine was 5.20 (on 10/13 Cr was 5.4)  Anticipated discharge: home once renal function improves.  Assessment/Plan:    Principal Problem: Accelerated hypertension  - Blood pressure more stable this AM  - Continue hydralazine to 50 mg PO Q 8 hours  - Continue to monitor on telemetry as pt with high K this AM  - pt denies chest pain   Active Problems: Bilateral hydronephrosis - CT abdomen on admission demonstrated bilateral hydronephrosi - Right hydro from presumed urinary retention and large prostate (~300 g) - s/p foley placement - Left hydro - from RP plasma cell involvement - will likely need to keep foley in upon discharge   BPH  - cont tamsulosin and finasteride  CKD (chronic kidney disease) stage 5, GFR less than 15 ml/min (HCC) / Myeloma kidney (HCC) - Worsening renal failure likely deu to myeloma kidney and bilateral hydronephrosis - Renal function better this AM - will repeat BMP in AM  Metabolic acidosis - Secondary to CKD - BMP in AM  Hyperkalemia - Due to metabolic acidosis  - high again this AM, give one dose of Kayexalate - repeat BMP in AM  Multiple myeloma (HCC) /  Kappa light  chain myeloma ([place]) - Follows with Dr. [name] of oncology  - His tx with revlimid, velcade and decadron stopped due to worsening renal failure    Anemia of chronic disease / Antineoplastic chemotherapy induced anemia / Leukopenia due to antineoplastic chemotherapy / Thrombocytopenia ([place]) - All related to sequela of chemotherapy  - Counts stable - Monitor daily CBC  General weakness - Due to MM - ambulation encouraged as pt able to tolerate   DVT Prophylaxis  - On anticoagulation with coumadin  Code Status: Full.  Family Communication:  plan of care discussed with the patient and wife at bedside  Disposition Plan: Home once renal function improves.   IV access:  Peripheral IV  Procedures and diagnostic studies:    Ct Abdomen Pelvis Wo Contrast 02/17/2015 Increased moderate right hydroureteronephrosis to the level of the urinary bladder, with increased bladder dilatation. This suggests vesicoureteral reflux. Stable moderate left hydronephrosis. Retroperitoneal mass in the region of the left ureteropelvic junction has decreased in size since prior study. Stable markedly enlarged prostate gland with bladder dilatation and diffuse wall thickening, consistent with bladder outlet obstruction. Electronically Signed   By: Earle Gell M.D.   On: 02/17/2015 16:11   Dg Chest 2 View 02/17/2015   1. No acute cardiopulmonary disease. 2. Resorbed lateral right seventh rib without significant change from the prior chest CT. Electronically Signed   By: Lajean Manes M.D.   On: 02/17/2015 14:38    Medical Consultants:  Urology, Dr. Cristela Felt  Renal, Dr. Roney Jaffe  Other Consultants:  Physical  therapy    IAnti-Infectives:   None   Faye Ramsay, MD  [facility] Pager [phone]  Time spent in minutes: 25 minutes  If 7PM-7AM, please contact night-coverage [email or web address] Password [facility] 02/22/2015, 6:52 AM   LOS: 5 days    HPI/Subjective: No acute overnight  events. Patient reports feeling better today.   Objective: Filed Vitals:   02/21/15 1753 02/21/15 2123 02/22/15 0256 02/22/15 0605  BP: 153/85 154/91 152/79 130/71  Pulse: 88 101 98 96  Temp:  98.3 F (36.8 C) 98.2 F (36.8 C) 98.4 F (36.9 C)  TempSrc:  Oral Oral Oral  Resp: $Remo'18 18 18 18  'ipUsh$ Height:      Weight:    89.6 kg (197 lb 8.5 oz)  SpO2:  100% 100% 100%    Intake/Output Summary (Last 24 hours) at 02/22/15 0923 Last data filed at 02/22/15 3007  Gross per 24 hour  Intake 2076.67 ml  Output   3750 ml  Net -1673.33 ml    Exam:   General:  Pt is alert, not in acute distress  Cardiovascular: Regular rate and rhythm, S1/S2, no murmurs  Respiratory: Clear to auscultation bilaterally, no wheezing, no crackles, no rhonchi  Abdomen: Soft, non tender, non distended, bowel sounds present  Data Reviewed: Basic Metabolic Panel:  Recent Labs Lab 02/19/15 0508 02/20/15 1345 02/20/15 1712 02/21/15 0552 02/22/15 0440  NA 137 136 140 141 139  K 5.0 5.3* 5.1 5.2* 6.4*  CL 107 108 111 113* 113*  CO2 17* 17* 19* 18* 18*  GLUCOSE 105* 143* 135* 121* 156*  BUN 59* 53* 54* 52* 45*  CREATININE 5.84* 5.06* 5.11* 4.83* 4.03*  CALCIUM 8.9 9.2 9.4 9.1 9.1  PHOS 4.6  --   --   --   --    Liver Function Tests:  Recent Labs Lab 02/17/15 1348 02/19/15 0508  AST 15  --   ALT 10*  --   ALKPHOS 53  --   BILITOT 1.0  --   PROT 7.2  --   ALBUMIN 4.0 3.6   CBC:  Recent Labs Lab 02/17/15 1348 02/17/15 1403 02/18/15 0450 02/19/15 0508 02/21/15 0552 02/22/15 0440  WBC 2.9*  --  2.7* 3.2* 3.2* 3.5*  NEUTROABS 1.7  --   --   --   --  2.9  HGB 9.4* 9.2* 8.4* 9.6* 9.4* 9.3*  HCT 27.0* 27.0* 24.0* 27.4* 27.3* 27.9*  MCV 90.6  --  90.2 91.0 93.8 93.9  PLT 104*  --  101* 125* 135* 141*   Recent Results (from the past 240 hour(s))  Urine culture     Status: None   Collection Time: 02/17/15  5:00 PM  Result Value Ref Range Status   Specimen Description URINE, CLEAN CATCH   Final   Special Requests NONE  Final   Culture   Final    NO GROWTH 1 DAY Performed at [facility]    Report Status 02/18/2015 FINAL  Final  Surgical pcr screen     Status: Abnormal   Collection Time: 02/21/15  1:49 PM  Result Value Ref Range Status   MRSA, PCR NEGATIVE NEGATIVE Final   Staphylococcus aureus POSITIVE (A) NEGATIVE Final    Comment:        The Xpert SA Assay (FDA approved for NASAL specimens in patients over 7 years of age), is one component of a comprehensive surveillance program.  Test performance has been validated by [facility] for patients greater than or equal to  59 year old. It is not intended to diagnose infection nor to guide or monitor treatment.      Scheduled Meds: . finasteride  5 mg Oral Daily  . hydrALAZINE  50 mg Oral 3 times per day  . sodium chloride  3 mL Intravenous Q12H  . tamsulosin  0.4 mg Oral Daily  . Warfarin - Pharmacist Dosing Inpatient   Does not apply q1800   Continuous Infusions: . dextrose 5 % and 0.45% NaCl 100 mL/hr at 02/22/15 0229

## 2015-02-22 NOTE — Progress Notes (Signed)
ANTICOAGULATION CONSULT NOTE - follow-up   Pharmacy Consult for Coumadin Indication: VTE prophylaxis  No Known Allergies  Patient Measurements: Height: 5' 8" (172.7 cm) Weight: 197 lb 8.5 oz (89.6 kg) IBW/kg (Calculated) : 68.4  Vital Signs: Temp: 98.4 F (36.9 C) (10/21 0605) Temp Source: Oral (10/21 0605) BP: 130/71 mmHg (10/21 0605) Pulse Rate: 96 (10/21 0605)  Labs:  Recent Labs  02/20/15 0530  02/20/15 1712 02/21/15 0552 02/22/15 0440  HGB  --   --   --  9.4* 9.3*  HCT  --   --   --  27.3* 27.9*  PLT  --   --   --  135* 141*  LABPROT 14.6  --   --   --   --   INR 1.13  --   --   --   --   CREATININE  --   < > 5.11* 4.83* 4.03*  < > = values in this interval not displayed.  Estimated Creatinine Clearance: 19.3 mL/min (by C-G formula based on Cr of 4.03).   Medical History: Past Medical History  Diagnosis Date  . Pulmonary nodule, right     W/  CHEST WALL MASS--  SCHEDULED FOR BIOPSY W/ DR [name] ON FRIDAY 10-27-2013  . History of seizures as a child     AGE 69 OR 8--- NONE ISSUE SINCE  . History of GI bleed     SECONDARY TO ULCER --  DEC 2011  RESOLVED PER LAST EGD 07-05-2010  . Bladder tumor   . Hematuria   . Radiation 12/07/13-12/27/13    right anterior chest 37.5 gray  . Fracture of radius 09/29/14    left  . Cancer ([place])     multiple myeloma, bladder cancer 2015    Medications:  warfarin 39m po daily  Assessment: 69yo M with multiple myeloma.  He was started on Coumadin 67mdaily in July 2016 by Dr MoJulien Nordmannor VTE prevention while on Revlimid, which has since been discontinued.  Patient remains on warfarin.  Called Dr. MoJulien Nordmann0/17 to clarify plan for warfarin and he wishes to continue warfarin at 43m74maily (no INR monitoring or goal). INR at admission = 1.11.    SQ heparin was started 10/16 pending clarification with oncology (Stopped 10/17)  Today, 02/22/2015  CBC: Hgb low but stable, pltc remains low but improving (remains > 100)  No  bleeding documented  INR 1.13 on 10/19  Regular diet  No significant drug-drug intxn  S/p cystoscopy and foley cath placement on 10/20.  Warfarin dose d/ced on 10/20 per Dr. EskJunious SilkSpoke to Dr. EskJunious Silke is ok with resuming warfarin back on 10/21  Goal of Therapy:  VTE prevention (no INR goal per oncology)   Plan:   Continue warfarin 43mg69mily  INR q72h as warfarin for prophylaxis, pharmacy to follow-up on days INR are drawn (next lab draw on 02/23/15)  Shaneca Orne Dia SitterarmD, BCPS 02/22/2015 8:12 AM

## 2015-02-22 NOTE — Progress Notes (Addendum)
BMP this am revealed K+ 6.4, result called to provider. Verbal orders received for Kayexalate 30 gm, redraw BMP 3-4 hours later and discontinue IVF.

## 2015-02-23 DIAGNOSIS — I10 Essential (primary) hypertension: Secondary | ICD-10-CM | POA: Insufficient documentation

## 2015-02-23 LAB — CBC
HCT: 26.3 % — ABNORMAL LOW (ref 39.0–52.0)
Hemoglobin: 9.2 g/dL — ABNORMAL LOW (ref 13.0–17.0)
MCH: 33 pg (ref 26.0–34.0)
MCHC: 35 g/dL (ref 30.0–36.0)
MCV: 94.3 fL (ref 78.0–100.0)
PLATELETS: 141 10*3/uL — AB (ref 150–400)
RBC: 2.79 MIL/uL — AB (ref 4.22–5.81)
RDW: 16.6 % — AB (ref 11.5–15.5)
WBC: 3.4 10*3/uL — ABNORMAL LOW (ref 4.0–10.5)

## 2015-02-23 LAB — PROTIME-INR
INR: 1.2 (ref 0.00–1.49)
Prothrombin Time: 15.4 seconds — ABNORMAL HIGH (ref 11.6–15.2)

## 2015-02-23 LAB — BASIC METABOLIC PANEL
Anion gap: 9 (ref 5–15)
BUN: 47 mg/dL — AB (ref 6–20)
CHLORIDE: 110 mmol/L (ref 101–111)
CO2: 20 mmol/L — AB (ref 22–32)
Calcium: 8.8 mg/dL — ABNORMAL LOW (ref 8.9–10.3)
Creatinine, Ser: 3.54 mg/dL — ABNORMAL HIGH (ref 0.61–1.24)
GFR calc Af Amer: 19 mL/min — ABNORMAL LOW (ref >60)
GFR calc non Af Amer: 16 mL/min — ABNORMAL LOW (ref >60)
GLUCOSE: 104 mg/dL — AB (ref 65–99)
Potassium: 5 mmol/L (ref 3.5–5.1)
Sodium: 139 mmol/L (ref 135–145)

## 2015-02-23 MED ORDER — FINASTERIDE 5 MG PO TABS: 5.0000 mg | ORAL_TABLET | Freq: Every day | ORAL | Status: DC

## 2015-02-23 MED ORDER — HYDRALAZINE HCL 50 MG PO TABS: 50.0000 mg | ORAL_TABLET | Freq: Three times a day (TID) | ORAL | Status: DC

## 2015-02-23 MED ORDER — HYDROCODONE-ACETAMINOPHEN 5-325 MG PO TABS: 1.0000 | ORAL_TABLET | ORAL | Status: DC | PRN

## 2015-02-23 MED ORDER — TAMSULOSIN HCL 0.4 MG PO CAPS: 0.4000 mg | ORAL_CAPSULE | Freq: Every day | ORAL | Status: DC

## 2015-02-23 NOTE — Discharge Summary (Signed)
Physician Discharge Summary  Wesley [name] LHT:[id number] DOB: [birth year] DOA: 02/17/2015  PCP: [name], MD  Admit date: 02/17/2015 Discharge date: 02/23/2015  Recommendations for Outpatient Follow-up:  1. Pt will need to follow up with PCP in 2-3 weeks post discharge 2. Please obtain BMP to evaluate electrolytes and kidney function 3. Please also check CBC to evaluate Hg and Hct levels 4. Please note medication list and meds that were removed from the list as pt reported he is no longer taking  5. Please note that pt was discharged with foley cath and will need to see urologist Dr. Junious Silk for further instructions   Discharge Diagnoses:  Principal Problem:   Accelerated hypertension Active Problems:   Kappa light chain myeloma (HCC)   Anemia of chronic disease   Thrombocytopenia (HCC)   Hyperkalemia   Bilateral hydronephrosis   Metabolic acidosis   Multiple myeloma (HCC)   Myeloma kidney (HCC)   CKD (chronic kidney disease) stage 5, GFR less than 15 ml/min (HCC)   Leukopenia due to antineoplastic chemotherapy   Antineoplastic chemotherapy induced anemia   General weakness  Discharge Condition: Stable  Diet recommendation: Heart healthy diet discussed in details   Brief narrative:    69 yo male with past medical history of multiple myeloma (follows with Dr [name]), radiation to the tumor mass at the right anterior chest under the care of Dr. [name] completed 12/07/2013 through 12/27/2013, was on systemic chemotherapy with Velcade, Revlimid and Decadron, seen in cancer center 01/31/2015 at which time decision made to hold off on tx due to worsening renal failure.   Pt presented to [facility] ED with BP of 202/118 and generalized weakness without neurologic complaints. On admission, CT abdomen demonstrated bialteral hydronephrosis. In addition, his creatinine was 5.20 (on 10/13 Cr was 5.4)  Anticipated discharge: home once renal function  improves.  Assessment/Plan:    Principal Problem: Accelerated hypertension  - Blood pressure more stable this AM  - Continue hydralazine to 50 mg PO Q 8 hours   Active Problems: Bilateral hydronephrosis - CT abdomen on admission demonstrated bilateral hydronephrosi - Right hydro from presumed urinary retention and large prostate (~300 g) - s/p foley placement - Left hydro - from RP plasma cell involvement - keep foley in upon discharge   BPH  - cont tamsulosin and finasteride  CKD (chronic kidney disease) stage 5, GFR less than 15 ml/min (HCC) / Myeloma kidney (HCC) - Worsening renal failure likely deu to myeloma kidney and bilateral hydronephrosis - Renal function better this AM  Metabolic acidosis - Secondary to CKD - improving   Hyperkalemia - Due to metabolic acidosis  - resolved   Multiple myeloma (HCC) / Kappa light chain myeloma ([place]) - Follows with Dr. [name] of oncology  - His tx with revlimid, velcade and decadron stopped due to worsening renal failure   Anemia of chronic disease / Antineoplastic chemotherapy induced anemia / Leukopenia due to antineoplastic chemotherapy / Thrombocytopenia ([place]) - All related to sequela of chemotherapy  - Counts stable  General weakness - Due to MM - ambulation tolerated well   DVT Prophylaxis  - On anticoagulation with coumadin  Code Status: Full.  Family Communication: plan of care discussed with the patient and wife at bedside  Disposition Plan: Home   IV access:  Peripheral IV  Procedures and diagnostic studies:   Ct Abdomen Pelvis Wo Contrast 02/17/2015 Increased moderate right hydroureteronephrosis to the level of the urinary bladder, with increased bladder dilatation. This suggests vesicoureteral reflux. Stable  moderate left hydronephrosis. Retroperitoneal mass in the region of the left ureteropelvic junction has decreased in size since prior study. Stable markedly enlarged prostate gland  with bladder dilatation and diffuse wall thickening, consistent with bladder outlet obstruction. Electronically Signed By: Earle Gell M.D. On: 02/17/2015 16:11   Dg Chest 2 View 02/17/2015 1. No acute cardiopulmonary disease. 2. Resorbed lateral right seventh rib without significant change from the prior chest CT. Electronically Signed By: Lajean Manes M.D. On: 02/17/2015 14:38    Medical Consultants:  Urology, Dr. Cristela Felt  Renal, Dr. Roney Jaffe  Other Consultants:  Physical therapy   IAnti-Infectives:   None       Discharge Exam: Filed Vitals:   02/23/15 0601  BP: 136/86  Pulse: 92  Temp: 99 F (37.2 C)  Resp: 20   Filed Vitals:   02/22/15 1500 02/22/15 2125 02/23/15 0601 02/23/15 0608  BP: 148/94 131/71 136/86   Pulse:  107 92   Temp:  99.6 F (37.6 C) 99 F (37.2 C)   TempSrc:  Oral Oral   Resp:  22 20   Height:      Weight:    88.678 kg (195 lb 8 oz)  SpO2:  100% 100%     General: Pt is alert, follows commands appropriately, not in acute distress Cardiovascular: Regular rate and rhythm, no rubs, no gallops Respiratory: Clear to auscultation bilaterally, no wheezing, no crackles, no rhonchi Abdominal: Soft, non tender, non distended, bowel sounds +, no guarding  Discharge Instructions     Medication List    STOP taking these medications        acyclovir 400 MG tablet  Commonly known as:  ZOVIRAX     dexamethasone 4 MG tablet  Commonly known as:  DECADRON     furosemide 20 MG tablet  Commonly known as:  LASIX     lenalidomide 25 MG capsule  Commonly known as:  REVLIMID     potassium chloride SA 20 MEQ tablet  Commonly known as:  K-DUR,KLOR-CON     prochlorperazine 10 MG tablet  Commonly known as:  COMPAZINE      TAKE these medications        finasteride 5 MG tablet  Commonly known as:  PROSCAR  Take 1 tablet (5 mg total) by mouth daily.     hydrALAZINE 50 MG tablet  Commonly known as:  APRESOLINE   Take 1 tablet (50 mg total) by mouth every 8 (eight) hours.     HYDROcodone-acetaminophen 5-325 MG tablet  Commonly known as:  NORCO/VICODIN  Take 1-2 tablets by mouth every 4 (four) hours as needed for moderate pain.     tamsulosin 0.4 MG Caps capsule  Commonly known as:  FLOMAX  Take 1 capsule (0.4 mg total) by mouth daily.     warfarin 2 MG tablet  Commonly known as:  COUMADIN  Take 1 tablet (2 mg total) by mouth daily.           Follow-up Information    Follow up with ESKRIDGE, MATTHEW, MD In 2 weeks.   Specialty:  Urology   Contact information:   [place] [id number] [phone]       Follow up with [name], MD.   Specialty:  Internal Medicine   Contact information:   [place] [address] [phone]       Call Faye Ramsay, MD.   Specialty:  Internal Medicine   Why:  As needed call  my cell phone [phone]   Contact information:   [address] [place] [address] [phone]        The results of significant diagnostics from this hospitalization (including imaging, microbiology, ancillary and laboratory) are listed below for reference.     Microbiology: Recent Results (from the past 240 hour(s))  Urine culture     Status: None   Collection Time: 02/17/15  5:00 PM  Result Value Ref Range Status   Specimen Description URINE, CLEAN CATCH  Final   Special Requests NONE  Final   Culture   Final    NO GROWTH 1 DAY Performed at [facility]    Report Status 02/18/2015 FINAL  Final  Surgical pcr screen     Status: Abnormal   Collection Time: 02/21/15  1:49 PM  Result Value Ref Range Status   MRSA, PCR NEGATIVE NEGATIVE Final   Staphylococcus aureus POSITIVE (A) NEGATIVE Final    Comment:        The Xpert SA Assay (FDA approved for NASAL specimens in patients over 39 years of age), is one component of a comprehensive surveillance program.  Test  performance has been validated by [facility] for patients greater than or equal to 65 year old. It is not intended to diagnose infection nor to guide or monitor treatment.      Labs: Basic Metabolic Panel:  Recent Labs Lab 02/19/15 0508  02/20/15 1712 02/21/15 0552 02/22/15 0440 02/22/15 1415 02/23/15 0508  NA 137  < > 140 141 139 138 139  K 5.0  < > 5.1 5.2* 6.4* 4.8 5.0  CL 107  < > 111 113* 113* 109 110  CO2 17*  < > 19* 18* 18* 18* 20*  GLUCOSE 105*  < > 135* 121* 156* 135* 104*  BUN 59*  < > 54* 52* 45* 46* 47*  CREATININE 5.84*  < > 5.11* 4.83* 4.03* 3.95* 3.54*  CALCIUM 8.9  < > 9.4 9.1 9.1 9.4 8.8*  PHOS 4.6  --   --   --   --   --   --   < > = values in this interval not displayed. Liver Function Tests:  Recent Labs Lab 02/17/15 1348 02/19/15 0508  AST 15  --   ALT 10*  --   ALKPHOS 53  --   BILITOT 1.0  --   PROT 7.2  --   ALBUMIN 4.0 3.6   No results for input(s): LIPASE, AMYLASE in the last 168 hours. No results for input(s): AMMONIA in the last 168 hours. CBC:  Recent Labs Lab 02/17/15 1348  02/18/15 0450 02/19/15 0508 02/21/15 0552 02/22/15 0440 02/23/15 0508  WBC 2.9*  --  2.7* 3.2* 3.2* 3.5* 3.4*  NEUTROABS 1.7  --   --   --   --  2.9  --   HGB 9.4*  < > 8.4* 9.6* 9.4* 9.3* 9.2*  HCT 27.0*  < > 24.0* 27.4* 27.3* 27.9* 26.3*  MCV 90.6  --  90.2 91.0 93.8 93.9 94.3  PLT 104*  --  101* 125* 135* 141* 141*  < > = values in this interval not displayed.  SIGNED: Time coordinating discharge:  30 minutes  Faye Ramsay, MD  [facility] 02/23/2015, 9:39 AM Pager [phone]  If 7PM-7AM, please contact night-coverage [email or web address] Password [email or web address]

## 2015-02-23 NOTE — Progress Notes (Signed)
Urology Progress Note  2 Days Post-Op   Subjective:69 year old with multiple myeloma, a 250-300 g prostate, right hydronephrosis presumed to be caused by incomplete bladder emptying, left hydronephrosis caused by multiple myeloma plasma cell mass with acute renal failure. Post cysto, and foley cath per Dr. Junious Silk. Ready for d/c today.      No acute urologic events overnight. Ambulation:   positive Flatus:    positive Bowel movement  positive  Pain: some relief  Objective:  Blood pressure 136/86, pulse 92, temperature 99 F (37.2 C), temperature source Oral, resp. rate 20, height _0  (1.727 m), weight 88.678 kg (195 lb 8 oz), SpO2 100 %.  Physical Exam:  General:  No acute distress, awake Extremities: extremities normal, atraumatic, no cyanosis or edema  Foley: clear urine    I/O last 3 completed shifts: In: 1120 [P.O.:1120] Out: 7780 [Urine:7780]  Recent Labs     02/22/15  0440  02/23/15  0508  HGB  9.3*  9.2*  WBC  3.5*  3.4*  PLT  141*  141*    Recent Labs     02/22/15  1415  02/23/15  0508  NA  138  139  K  4.8  5.0  CL  109  110  CO2  18*  20*  BUN  46*  47*  CREATININE  3.95*  3.54*  CALCIUM  9.4  8.8*  GFRNONAA  14*  16*  GFRAA  17*  19*     Recent Labs     02/23/15  0508  INR  1.20     Invalid input(s): ABG  Assessment/Plan:  Catheter not removed. F/u with Dr. Junious Silk

## 2015-02-23 NOTE — Discharge Instructions (Signed)
Acute Kidney Injury °Acute kidney injury is any condition in which there is sudden (acute) damage to the kidneys. Acute kidney injury was previously known as acute kidney failure or acute renal failure. The kidneys are two organs that lie on either side of the spine between the middle of the back and the front of the abdomen. The kidneys: °· Remove wastes and extra water from the blood.   °· Produce important hormones. These help keep bones strong, regulate blood pressure, and help create red blood cells.   °· Balance the fluids and chemicals in the blood and tissues. °A small amount of kidney damage may not cause problems, but a large amount of damage may make it difficult or impossible for the kidneys to work the way they should. Acute kidney injury may develop into long-lasting (chronic) kidney disease. It may also develop into a life-threatening disease called end-stage kidney disease. Acute kidney injury can get worse very quickly, so it should be treated right away. Early treatment may prevent other kidney diseases from developing. °CAUSES  °· A problem with blood flow to the kidneys. This may be caused by:   °¨ Blood loss.   °¨ Heart disease.   °¨ Severe burns.   °¨ Liver disease. °· Direct damage to the kidneys. This may be caused by: °¨ Some medicines.   °¨ A kidney infection.   °¨ Poisoning or consuming toxic substances.   °¨ A surgical wound.   °¨ A blow to the kidney area.   °· A problem with urine flow. This may be caused by:   °¨ Cancer.   °¨ Kidney stones.   °¨ An enlarged prostate. °SIGNS AND SYMPTOMS  °· Swelling (edema) of the legs, ankles, or feet.   °· Tiredness (lethargy).   °· Nausea or vomiting.   °· Confusion.   °· Problems with urination, such as:   °¨ Painful or burning feeling during urination.   °¨ Decreased urine production.   °¨ Frequent accidents in children who are potty trained.   °¨ Bloody urine.   °· Muscle twitches and cramps.   °· Shortness of breath.   °· Seizures.   °· Chest  pain or pressure. °Sometimes, no symptoms are present.  °DIAGNOSIS °Acute kidney injury may be detected and diagnosed by tests, including blood, urine, imaging, or kidney biopsy tests.  °TREATMENT °Treatment of acute kidney injury varies depending on the cause and severity of the kidney damage. In mild cases, no treatment may be needed. The kidneys may heal on their own. If acute kidney injury is more severe, your health care provider will treat the cause of the kidney damage, help the kidneys heal, and prevent complications from occurring. Severe cases may require a procedure to remove toxic wastes from the body (dialysis) or surgery to repair kidney damage. Surgery may involve:  °· Repair of a torn kidney.   °· Removal of an obstruction. °HOME CARE INSTRUCTIONS °· Follow your prescribed diet. °· Take medicines only as directed by your health care provider.  °· Do not take any new medicines (prescription, over-the-counter, or nutritional supplements) unless approved by your health care provider. Many medicines can worsen your kidney damage or may need to have the dose adjusted.   °· Keep all follow-up visits as directed by your health care provider. This is important. °· Observe your condition to make sure you are healing as expected. °SEEK IMMEDIATE MEDICAL CARE IF: °· You are feeling ill or have severe pain in the back or side.   °· Your symptoms return or you have new symptoms. °· You have any symptoms of end-stage kidney disease. These include:   °¨ Persistent itchiness.   °¨   Loss of appetite.   °¨ Headaches.   °¨ Abnormally dark or light skin. °¨ Numbness in the hands or feet.   °¨ Easy bruising.   °¨ Frequent hiccups.   °¨ Menstruation stops.   °· You have a fever. °· You have increased urine production. °· You have pain or bleeding when urinating. °MAKE SURE YOU:  °· Understand these instructions. °· Will watch your condition. °· Will get help right away if you are not doing well or get worse. °  °This  information is not intended to replace advice given to you by your health care provider. Make sure you discuss any questions you have with your health care provider. °  °Document Released: 11/03/2010 Document Revised: 05/11/2014 Document Reviewed: 12/18/2011 °Elsevier Interactive Patient Education ©2016 [employer] ° °

## 2015-02-23 NOTE — Progress Notes (Signed)
Patient given discharge, medication and f/u instructions, verbalized understanding, prescriptions given to pt, IV removed, Foley catheter intact to leg drainage bag, family to transport home

## 2015-02-27 ENCOUNTER — Encounter: Payer: Self-pay | Admitting: *Deleted

## 2015-02-28 ENCOUNTER — Ambulatory Visit: Payer: Medicare HMO

## 2015-02-28 ENCOUNTER — Ambulatory Visit (HOSPITAL_BASED_OUTPATIENT_CLINIC_OR_DEPARTMENT_OTHER): Payer: Medicare HMO | Admitting: Nurse Practitioner

## 2015-02-28 ENCOUNTER — Ambulatory Visit: Payer: Medicare HMO | Admitting: Physician Assistant

## 2015-02-28 ENCOUNTER — Other Ambulatory Visit (HOSPITAL_BASED_OUTPATIENT_CLINIC_OR_DEPARTMENT_OTHER): Payer: Medicare HMO

## 2015-02-28 VITALS — BP 143/85 | HR 103 | Temp 97.8°F | Resp 18 | Ht 68.0 in | Wt 192.9 lb

## 2015-02-28 DIAGNOSIS — N179 Acute kidney failure, unspecified: Secondary | ICD-10-CM

## 2015-02-28 DIAGNOSIS — N08 Glomerular disorders in diseases classified elsewhere: Secondary | ICD-10-CM

## 2015-02-28 DIAGNOSIS — C9000 Multiple myeloma not having achieved remission: Secondary | ICD-10-CM

## 2015-02-28 LAB — COMPREHENSIVE METABOLIC PANEL (CC13)
ALBUMIN: 4 g/dL (ref 3.5–5.0)
ALK PHOS: 67 U/L (ref 40–150)
ALT: <9 U/L (ref 0–55)
ANION GAP: 8 meq/L (ref 3–11)
AST: 10 U/L (ref 5–34)
BILIRUBIN TOTAL: 0.78 mg/dL (ref 0.20–1.20)
BUN: 31.5 mg/dL — ABNORMAL HIGH (ref 7.0–26.0)
CO2: 20 mEq/L — ABNORMAL LOW (ref 22–29)
CREATININE: 2.4 mg/dL — AB (ref 0.7–1.3)
Calcium: 10 mg/dL (ref 8.4–10.4)
Chloride: 108 mEq/L (ref 98–109)
EGFR: 32 mL/min/{1.73_m2} — AB (ref >90)
Glucose: 115 mg/dl (ref 70–140)
Potassium: 4.5 mEq/L (ref 3.5–5.1)
Sodium: 136 mEq/L (ref 136–145)
TOTAL PROTEIN: 7.6 g/dL (ref 6.4–8.3)

## 2015-02-28 LAB — CBC WITH DIFFERENTIAL/PLATELET
BASO%: 1.5 % (ref 0.0–2.0)
BASOS ABS: 0 10*3/uL (ref 0.0–0.1)
EOS ABS: 0.2 10*3/uL (ref 0.0–0.5)
EOS%: 6.6 % (ref 0.0–7.0)
HEMATOCRIT: 31.5 % — AB (ref 38.4–49.9)
HEMOGLOBIN: 10.5 g/dL — AB (ref 13.0–17.1)
LYMPH%: 13.1 % — ABNORMAL LOW (ref 14.0–49.0)
MCH: 30.8 pg (ref 27.2–33.4)
MCHC: 33.2 g/dL (ref 32.0–36.0)
MCV: 92.7 fL (ref 79.3–98.0)
MONO#: 0.7 10*3/uL (ref 0.1–0.9)
MONO%: 20.6 % — AB (ref 0.0–14.0)
NEUT%: 58.2 % (ref 39.0–75.0)
NEUTROS ABS: 1.9 10*3/uL (ref 1.5–6.5)
PLATELETS: 168 10*3/uL (ref 140–400)
RBC: 3.4 10*6/uL — ABNORMAL LOW (ref 4.20–5.82)
RDW: 17.2 % — AB (ref 11.0–14.6)
WBC: 3.3 10*3/uL — ABNORMAL LOW (ref 4.0–10.3)
lymph#: 0.4 10*3/uL — ABNORMAL LOW (ref 0.9–3.3)

## 2015-02-28 NOTE — Progress Notes (Unsigned)
  Chart entered in error. Patient no show to scheduled appt, seen later by Drue Second, NP

## 2015-03-01 NOTE — Progress Notes (Signed)
Chart entered in error

## 2015-03-03 ENCOUNTER — Encounter: Payer: Self-pay | Admitting: Nurse Practitioner

## 2015-03-03 ENCOUNTER — Other Ambulatory Visit: Payer: Self-pay | Admitting: Nurse Practitioner

## 2015-03-03 NOTE — Assessment & Plan Note (Signed)
Patient presented to the [facility] for cycle 11 of his Velcade injections.  He is also scheduled to initiate his next cycle of Revlimid as well.  Patient was admitted to the hospital on 02/17/2015; and was just discharged from the hospital on 02/23/2015 following diagnosis of acute renal failure.  Creatinine at highest level was 5.11.  Admission notes state that:  Right hydro from presumed urinary retention and large prostate (~300 g) - s/p foley placement.  Left hydro - from RP plasma cell involvement. Improved on recent CT. BPH - cont tamsulosin and finasteride Incomplete bladder emptying - continue foley. Would anticipate leaving in at discharge with f/u in office next 1 - 2 weeks for void trial and Cr recheck.          Patient continues with Foley catheter; with no issues at this time.  Patient states that he is feeling very well now; it has no specific complaints.  He also denies any recent fevers or chills.  Labs obtained today reveal a WBC of 3.3, ANC 1.9, hemoglobin 10.5, and platelet count of 168.  Creatinine has decreased from 5.11 down to 2.4.  Patient also reports that he remains on Coumadin for his recent hospital stay; it is unclear whether he should continue with the Coumadin.  Advised patient to review with Dr. Glendell Docker then follow up with the patient appropriately.  Patient states that he has a follow-up appointment with urology on 03/06/2015.  Since patient was just discharged from the hospital this past Saturday, 02/23/2015-will hold both Velcade injection and Revlimid oral chemotherapy for 1 week to allow for full recovery.  Patient will need to be scheduled for labs, visit, and his Velcade injection on 03/07/2015.

## 2015-03-03 NOTE — Assessment & Plan Note (Signed)
Patient presented to the [facility] for cycle 11 of his Velcade injections.  He is also scheduled to initiate his next cycle of Revlimid as well.  Patient was admitted to the hospital on 02/17/2015; and was just discharged from the hospital on 02/23/2015 following diagnosis of acute renal failure.  Creatinine at highest level was 5.11.  Patient states that he is feeling very well now; it has no specific complaints.  He also denies any recent fevers or chills.  Patient also reports that he remains on Coumadin for his recent hospital stay; it is unclear whether he should continue with the Coumadin.  Advised patient to review with Dr. Glendell Docker then follow up with the patient appropriately.  Patient states that he has a follow-up appointment with urology on 03/06/2015.  Since patient was just discharged from the hospital this past Saturday, 02/23/2015-will hold both Velcade injection and Revlimid oral chemotherapy for 1 week to allow for full recovery.  Patient will need to be scheduled for labs, visit, and his Velcade injection on 03/07/2015.

## 2015-03-03 NOTE — Progress Notes (Signed)
SYMPTOM MANAGEMENT CLINIC   HPI: Wesley [name] 69 y.o. male diagnosed with multiple myeloma.  Currently undergoing Velcade injections, Revlimid oral therapy, and dexamethasone.  Patient presented to the [facility] for cycle 11 of his Velcade injections.  He is also scheduled to initiate his next cycle of Revlimid as well.  Patient was admitted to the hospital on 02/17/2015; and was just discharged from the hospital on 02/23/2015 following diagnosis of acute renal failure.  Creatinine at highest level was 5.11.  Admission notes state that:  Right hydro from presumed urinary retention and large prostate (~300 g) - s/p foley placement.  Left hydro - from RP plasma cell involvement. Improved on recent CT. BPH - cont tamsulosin and finasteride Incomplete bladder emptying - continue foley. Would anticipate leaving in at discharge with f/u in office next 1 - 2 weeks for void trial and Cr recheck.          Patient continues with Foley catheter; with no issues at this time.  Patient states that he is feeling very well now; it has no specific complaints.  He also denies any recent fevers or chills.  Labs obtained today reveal a WBC of 3.3, ANC 1.9, hemoglobin 10.5, and platelet count of 168.  Creatinine has decreased from 5.11 down to 2.4.  Patient also reports that he remains on Coumadin for his recent hospital stay; it is unclear whether he should continue with the Coumadin.  Advised patient to review with Dr. Glendell Docker then follow up with the patient appropriately.  Patient states that he has a follow-up appointment with urology on 03/06/2015.  Since patient was just discharged from the hospital this past Saturday, 02/23/2015-will hold both Velcade injection and Revlimid oral chemotherapy for 1 week to allow for full recovery.  Patient will need to be scheduled for labs, visit, and his Velcade injection on 03/07/2015.  HPI  ROS  Past Medical History  Diagnosis Date  .  Pulmonary nodule, right     W/  CHEST WALL MASS--  SCHEDULED FOR BIOPSY W/ DR [name] ON FRIDAY 10-27-2013  . History of seizures as a child     AGE 72 OR 8--- NONE ISSUE SINCE  . History of GI bleed     SECONDARY TO ULCER --  DEC 2011  RESOLVED PER LAST EGD 07-05-2010  . Bladder tumor   . Hematuria   . Radiation 12/07/13-12/27/13    right anterior chest 37.5 gray  . Fracture of radius 09/29/14    left  . Cancer [facility])     multiple myeloma, bladder cancer 2015    Past Surgical History  Procedure Laterality Date  . Excision benign left axilla tumor  1993  . Esophagogastroduodenoscopy  X5   LAST ONE 07-05-2010  . Transurethral resection of bladder tumor with gyrus (turbt-gyrus) N/A 10/25/2013    Procedure: TRANSURETHRAL RESECTION OF BLADDER TUMOR WITH GYRUS (TURBT-GYRUS);  Surgeon: Sharyn Creamer, MD;  Location: [facility];  Service: Urology;  Laterality: N/A;  . Cystoscopy with biopsy N/A 10/25/2013    Procedure: CYSTOSCOPY;  Surgeon: Sharyn Creamer, MD;  Location: [facility];  Service: Urology;  Laterality: N/A;  . Bone biopsy Right 11/14/13     right seventh rib lesion  . Orif radial fracture Left 10/22/2014    Procedure: Intramedullary nailing left radius fracture;  Surgeon: Milly Jakob, MD;  Location: [place];  Service: Orthopedics;  Laterality: Left;  OPEN TREATMENT LEFT RADIUS FRACTURE  . 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    has No Known Allergies.    Medication List       This list is accurate as of: 02/28/15 11:59 PM.  Always use your most recent med list.               finasteride 5 MG tablet  Commonly known as:  PROSCAR  Take 1 tablet (5 mg total) by mouth daily.     hydrALAZINE 50 MG tablet  Commonly known as:  APRESOLINE  Take 1 tablet (50 mg total) by mouth every 8 (eight) hours.     HYDROcodone-acetaminophen 5-325 MG tablet  Commonly known as:  NORCO/VICODIN  Take 1-2 tablets by mouth every 4 (four) hours as needed for moderate pain.     tamsulosin 0.4 MG Caps capsule  Commonly known as:  FLOMAX  Take 1 capsule (0.4 mg total) by mouth daily.     warfarin 2 MG tablet  Commonly known as:  COUMADIN  Take 1 tablet (2 mg total) by mouth daily.         PHYSICAL EXAMINATION  Oncology Vitals 02/28/2015 02/23/2015 02/23/2015 02/22/2015 02/22/2015 02/22/2015 02/22/2015  Height 173 cm - - - - - -  Weight 87.499 kg 88.678 kg - - - 89.6 kg -  Weight (lbs) 192 lbs 14 oz 195 lbs 8 oz - - - 197 lbs 9 oz -  BMI (kg/m2) 29.33 kg/m2 29.73 kg/m2 - - - 30.03 kg/m2 -  Temp 97.8 - 99 99.6 98.1 98.4 98.2  Pulse 103 - 92 107 112 96 98  Resp 18 - $Re'20 22 18 18 18  'Iho$ SpO2 100 - 100 100 96 100 100  BSA (m2) 2.05 m2 2.06 m2 - - - 2.07 m2 -   BP Readings from Last 3 Encounters:  02/28/15 143/85  02/23/15 136/86  02/16/15 156/97    Physical Exam  Constitutional: He is oriented to person, place, and time and well-developed, well-nourished, and in no distress.  HENT:  Head: Normocephalic and atraumatic.  Mouth/Throat: Oropharynx is clear and moist.  Eyes: Conjunctivae and EOM are normal. Pupils are equal, round, and reactive to light. Right eye exhibits no discharge. Left eye exhibits no discharge. No scleral icterus.  Neck: Normal range of motion. Neck  supple. No JVD present. No tracheal deviation present. No thyromegaly present.  Cardiovascular: Normal rate, regular rhythm, normal heart sounds and intact distal pulses.   Pulmonary/Chest: Effort normal and breath sounds normal. No respiratory distress. He has no wheezes. He has no rales. He exhibits no tenderness.  Abdominal: Soft. Bowel sounds are normal. He exhibits no distension and no mass. There is no tenderness. There is no rebound and no guarding.  Genitourinary:  Foley cath in place.   Musculoskeletal: Normal range of motion. He exhibits no edema or tenderness.  Lymphadenopathy:    He has no cervical adenopathy.  Neurological: He is alert and oriented to person, place, and time. Gait normal.  Skin: Skin is  warm and dry. No rash noted. No erythema. No pallor.  Psychiatric: Affect normal.  Nursing note and vitals reviewed.   LABORATORY DATA:. Appointment on 02/28/2015  Component Date Value Ref Range Status  . WBC 02/28/2015 3.3* 4.0 - 10.3 10e3/uL Final  . NEUT# 02/28/2015 1.9  1.5 - 6.5 10e3/uL Final  . HGB 02/28/2015 10.5* 13.0 - 17.1 g/dL Final  . HCT 02/28/2015 31.5* 38.4 - 49.9 % Final  . Platelets 02/28/2015 168  140 - 400 10e3/uL Final  . MCV 02/28/2015 92.7  79.3 - 98.0 fL Final  . MCH 02/28/2015 30.8  27.2 - 33.4 pg Final  . MCHC 02/28/2015 33.2  32.0 - 36.0 g/dL Final  . RBC 02/28/2015 3.40* 4.20 - 5.82 10e6/uL Final  . RDW 02/28/2015 17.2* 11.0 - 14.6 % Final  . lymph# 02/28/2015 0.4* 0.9 - 3.3 10e3/uL Final  . MONO# 02/28/2015 0.7  0.1 - 0.9 10e3/uL Final  . Eosinophils Absolute 02/28/2015 0.2  0.0 - 0.5 10e3/uL Final  . Basophils Absolute 02/28/2015 0.0  0.0 - 0.1 10e3/uL Final  . NEUT% 02/28/2015 58.2  39.0 - 75.0 % Final  . LYMPH% 02/28/2015 13.1* 14.0 - 49.0 % Final  . MONO% 02/28/2015 20.6* 0.0 - 14.0 % Final  . EOS% 02/28/2015 6.6  0.0 - 7.0 % Final  . BASO% 02/28/2015 1.5  0.0 - 2.0 % Final  . Sodium 02/28/2015 136  136 - 145 mEq/L Final  . Potassium  02/28/2015 4.5  3.5 - 5.1 mEq/L Final  . Chloride 02/28/2015 108  98 - 109 mEq/L Final  . CO2 02/28/2015 20* 22 - 29 mEq/L Final  . Glucose 02/28/2015 115  70 - 140 mg/dl Final   Glucose reference range is for nonfasting patients. Fasting glucose reference range is 70- 100.  Marland Kitchen BUN 02/28/2015 31.5* 7.0 - 26.0 mg/dL Final  . Creatinine 02/28/2015 2.4* 0.7 - 1.3 mg/dL Final  . Total Bilirubin 02/28/2015 0.78  0.20 - 1.20 mg/dL Final  . Alkaline Phosphatase 02/28/2015 67  40 - 150 U/L Final  . AST 02/28/2015 10  5 - 34 U/L Final  . ALT 02/28/2015 <9  0 - 55 U/L Final  . Total Protein 02/28/2015 7.6  6.4 - 8.3 g/dL Final  . Albumin 02/28/2015 4.0  3.5 - 5.0 g/dL Final  . Calcium 02/28/2015 10.0  8.4 - 10.4 mg/dL Final  . Anion Gap 02/28/2015 8  3 - 11 mEq/L Final  . EGFR 02/28/2015 32* >90 ml/min/1.73 m2 Final   eGFR is calculated using the CKD-EPI Creatinine Equation (2009)     RADIOGRAPHIC STUDIES: No results found.  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  He is also scheduled to initiate his next cycle of Revlimid as well.  Patient was admitted to the hospital on 02/17/2015; and was just discharged from the hospital on 02/23/2015 following diagnosis of acute renal failure.  Creatinine at highest level was 5.11.  Patient states that he is feeling very well now; it has no specific complaints.  He also denies any recent fevers or chills.  Patient also reports that he remains on Coumadin for his recent hospital stay; it is unclear whether he should continue with the Coumadin.  Advised patient to review with Dr. Glendell Docker then follow up with the patient appropriately.  Patient states that he has a follow-up appointment with urology on 03/06/2015.  Since patient was just discharged from  the hospital this past Saturday, 02/23/2015-will hold both Velcade injection and Revlimid oral chemotherapy for  1 week to allow for full recovery.  Patient will need to be scheduled for labs, visit, and his Velcade injection on 03/07/2015.  Myeloma kidney [facility]) Patient presented to the [place] for cycle 11 of his Velcade injections.  He is also scheduled to initiate his next cycle of Revlimid as well.  Patient was admitted to the hospital on 02/17/2015; and was just discharged from the hospital on 02/23/2015 following diagnosis of acute renal failure.  Creatinine at highest level was 5.11.  Admission notes state that:  Right hydro from presumed urinary retention and large prostate (~300 g) - s/p foley placement.  Left hydro - from RP plasma cell involvement. Improved on recent CT. BPH - cont tamsulosin and finasteride Incomplete bladder emptying - continue foley. Would anticipate leaving in at discharge with f/u in office next 1 - 2 weeks for void trial and Cr recheck.          Patient continues with Foley catheter; with no issues at this time.  Patient states that he is feeling very well now; it has no specific complaints.  He also denies any recent fevers or chills.  Labs obtained today reveal a WBC of 3.3, ANC 1.9, hemoglobin 10.5, and platelet count of 168.  Creatinine has decreased from 5.11 down to 2.4.  Patient also reports that he remains on Coumadin for his recent hospital stay; it is unclear whether he should continue with the Coumadin.  Advised patient to review with Dr. Glendell Docker then follow up with the patient appropriately.  Patient states that he has a follow-up appointment with urology on 03/06/2015.  Since patient was just discharged from the hospital this past Saturday, 02/23/2015-will hold both Velcade injection and Revlimid oral chemotherapy for 1 week to allow for full recovery.  Patient will need to be scheduled for labs, visit, and his Velcade injection on 03/07/2015.  Patient stated understanding of all instructions; and was in agreement with this plan of  care. The patient knows to call the clinic with any problems, questions or concerns.   Review/collaboration with Dr. Burr Medico  regarding all aspects of patient's visit today.   Total time spent with patient was 25 minutes;  with greater than 75 percent of that time spent in face to face counseling regarding patient's symptoms,  and coordination of care and follow up.  Disclaimer:This dictation was prepared with Dragon/digital dictation along with [employer]. Any transcriptional errors that result from this process are unintentional.  Drue Second, NP 03/03/2015

## 2015-03-04 ENCOUNTER — Telehealth: Payer: Self-pay | Admitting: Nurse Practitioner

## 2015-03-04 NOTE — Telephone Encounter (Signed)
Aware of 11/3 appointments per pof

## 2015-03-05 ENCOUNTER — Telehealth: Payer: Self-pay | Admitting: *Deleted

## 2015-03-07 ENCOUNTER — Other Ambulatory Visit: Payer: Medicare HMO

## 2015-03-07 ENCOUNTER — Ambulatory Visit (HOSPITAL_BASED_OUTPATIENT_CLINIC_OR_DEPARTMENT_OTHER): Payer: Medicare HMO | Admitting: Nurse Practitioner

## 2015-03-07 ENCOUNTER — Ambulatory Visit: Payer: Medicare HMO | Admitting: Nurse Practitioner

## 2015-03-07 ENCOUNTER — Other Ambulatory Visit (HOSPITAL_BASED_OUTPATIENT_CLINIC_OR_DEPARTMENT_OTHER): Payer: Medicare HMO

## 2015-03-07 ENCOUNTER — Ambulatory Visit (HOSPITAL_BASED_OUTPATIENT_CLINIC_OR_DEPARTMENT_OTHER): Payer: Medicare HMO

## 2015-03-07 VITALS — BP 140/71 | HR 106 | Temp 98.1°F | Resp 18 | Ht 68.0 in | Wt 195.2 lb

## 2015-03-07 DIAGNOSIS — N179 Acute kidney failure, unspecified: Secondary | ICD-10-CM

## 2015-03-07 DIAGNOSIS — C9000 Multiple myeloma not having achieved remission: Secondary | ICD-10-CM | POA: Diagnosis not present

## 2015-03-07 DIAGNOSIS — Z5112 Encounter for antineoplastic immunotherapy: Secondary | ICD-10-CM | POA: Diagnosis not present

## 2015-03-07 DIAGNOSIS — N08 Glomerular disorders in diseases classified elsewhere: Secondary | ICD-10-CM

## 2015-03-07 LAB — COMPREHENSIVE METABOLIC PANEL (CC13)
ALT: 9 U/L (ref 0–55)
ANION GAP: 9 meq/L (ref 3–11)
AST: 10 U/L (ref 5–34)
Albumin: 3.9 g/dL (ref 3.5–5.0)
Alkaline Phosphatase: 80 U/L (ref 40–150)
BILIRUBIN TOTAL: 0.58 mg/dL (ref 0.20–1.20)
BUN: 25.7 mg/dL (ref 7.0–26.0)
CO2: 18 meq/L — AB (ref 22–29)
CREATININE: 2 mg/dL — AB (ref 0.7–1.3)
Calcium: 9.4 mg/dL (ref 8.4–10.4)
Chloride: 109 mEq/L (ref 98–109)
EGFR: 39 mL/min/{1.73_m2} — ABNORMAL LOW (ref >90)
GLUCOSE: 128 mg/dL (ref 70–140)
Potassium: 4.5 mEq/L (ref 3.5–5.1)
Sodium: 136 mEq/L (ref 136–145)
TOTAL PROTEIN: 7.3 g/dL (ref 6.4–8.3)

## 2015-03-07 LAB — CBC WITH DIFFERENTIAL/PLATELET
BASO%: 0.6 % (ref 0.0–2.0)
Basophils Absolute: 0 10*3/uL (ref 0.0–0.1)
EOS%: 4.9 % (ref 0.0–7.0)
Eosinophils Absolute: 0.2 10*3/uL (ref 0.0–0.5)
HCT: 28.6 % — ABNORMAL LOW (ref 38.4–49.9)
HEMOGLOBIN: 9.4 g/dL — AB (ref 13.0–17.1)
LYMPH%: 10.3 % — AB (ref 14.0–49.0)
MCH: 30.4 pg (ref 27.2–33.4)
MCHC: 33.1 g/dL (ref 32.0–36.0)
MCV: 91.9 fL (ref 79.3–98.0)
MONO#: 0.8 10*3/uL (ref 0.1–0.9)
MONO%: 18.9 % — ABNORMAL HIGH (ref 0.0–14.0)
NEUT%: 65.3 % (ref 39.0–75.0)
NEUTROS ABS: 2.9 10*3/uL (ref 1.5–6.5)
PLATELETS: 164 10*3/uL (ref 140–400)
RBC: 3.11 10*6/uL — AB (ref 4.20–5.82)
RDW: 17.9 % — AB (ref 11.0–14.6)
WBC: 4.5 10*3/uL (ref 4.0–10.3)
lymph#: 0.5 10*3/uL — ABNORMAL LOW (ref 0.9–3.3)

## 2015-03-07 MED ORDER — ONDANSETRON HCL 8 MG PO TABS
ORAL_TABLET | ORAL | Status: AC
  Filled 2015-03-07: qty 1

## 2015-03-07 MED ORDER — BORTEZOMIB CHEMO SQ INJECTION 3.5 MG (2.5MG/ML)
1.3000 mg/m2 | Freq: Once | INTRAMUSCULAR | Status: AC
  Administered 2015-03-07: 2.75 mg via SUBCUTANEOUS
  Filled 2015-03-07: qty 2.75

## 2015-03-07 MED ORDER — ONDANSETRON HCL 8 MG PO TABS
8.0000 mg | ORAL_TABLET | Freq: Once | ORAL | Status: AC
  Administered 2015-03-07: 8 mg via ORAL

## 2015-03-07 NOTE — Patient Instructions (Signed)
[facility] Discharge Instructions for Patients Receiving Chemotherapy  Today you received the following chemotherapy agents: Velcade.  To help prevent nausea and vomiting after your treatment, we encourage you to take your nausea medication: Compazine 10 mg every 6 hours as needed.   If you develop nausea and vomiting that is not controlled by your nausea medication, call the clinic.   BELOW ARE SYMPTOMS THAT SHOULD BE REPORTED IMMEDIATELY:  *FEVER GREATER THAN 100.5 F  *CHILLS WITH OR WITHOUT FEVER  NAUSEA AND VOMITING THAT IS NOT CONTROLLED WITH YOUR NAUSEA MEDICATION  *UNUSUAL SHORTNESS OF BREATH  *UNUSUAL BRUISING OR BLEEDING  TENDERNESS IN MOUTH AND THROAT WITH OR WITHOUT PRESENCE OF ULCERS  *URINARY PROBLEMS  *BOWEL PROBLEMS  UNUSUAL RASH Items with * indicate a potential emergency and should be followed up as soon as possible.  Feel free to call the clinic you have any questions or concerns. The clinic phone number is [phone].  Please show the CHEMO ALERT CARD at check-in to the Emergency Department and triage nurse.   

## 2015-03-07 NOTE — Progress Notes (Signed)
Ok to treat with todays creat of 2.0 per [employer].

## 2015-03-07 NOTE — Telephone Encounter (Signed)
err

## 2015-03-08 ENCOUNTER — Other Ambulatory Visit: Payer: Self-pay | Admitting: *Deleted

## 2015-03-08 ENCOUNTER — Telehealth: Payer: Self-pay | Admitting: *Deleted

## 2015-03-08 ENCOUNTER — Telehealth: Payer: Self-pay | Admitting: Internal Medicine

## 2015-03-08 ENCOUNTER — Encounter: Payer: Self-pay | Admitting: Nurse Practitioner

## 2015-03-08 DIAGNOSIS — C9000 Multiple myeloma not having achieved remission: Secondary | ICD-10-CM

## 2015-03-08 NOTE — Telephone Encounter (Signed)
lvm fo rpt regarding to [facility] appt....ok adn aware

## 2015-03-08 NOTE — Progress Notes (Signed)
SYMPTOM MANAGEMENT CLINIC   HPI: Wesley [name] 69 y.o. male diagnosed with multiple myeloma.  Currently undergoing Velcade injections, Revlimid oral therapy, and dexamethasone.  Patient was admitted to the hospital on 02/17/2015; and was just discharged from the hospital on 02/23/2015 following diagnosis of acute renal failure.  Creatinine at highest level was 5.11.  Admission notes state that:  Right hydro from presumed urinary retention and large prostate (~300 g) - s/p foley placement.  Left hydro - from RP plasma cell involvement. Improved on recent CT. BPH - cont tamsulosin and finasteride Incomplete bladder emptying - continue foley. Would anticipate leaving in at discharge with f/u in office next 1 - 2 weeks for void trial and Cr recheck.          Patient presented to the cancer Center last week on 02/28/2015 with plan to receive his next Velcade injection and to initiate his next cycle of Revlimid oral therapy.  Since he was just recovering from his recent admission-decision was made at that time to hold both the Velcade and the Revlimid.  Patient returns today for follow-up.  He states that he continues to feel very well.  He just met with his urologist, Dr. Mena Goes yesterday; and his Foley catheter was removed.  He states he has been urinating with no further difficulties; he denies both dysuria and hematuria.  He also denies any recent fevers or chills.  HPI  ROS  Past Medical History  Diagnosis Date  . Pulmonary nodule, right     W/  CHEST WALL MASS--  SCHEDULED FOR BIOPSY W/ DR Kendrick Fries ON FRIDAY 10-27-2013  . History of seizures as a child     AGE 64 OR 8--- NONE ISSUE SINCE  . History of GI bleed     SECONDARY TO ULCER --  DEC 2011  RESOLVED PER LAST EGD 07-05-2010  . Bladder tumor   . Hematuria   . Radiation 12/07/13-12/27/13    right anterior chest 37.5 gray  . Fracture of radius 09/29/14    left  . Cancer [facility])     multiple myeloma, bladder cancer 2015     Past Surgical History  Procedure Laterality Date  . Excision benign left axilla tumor  1993  . Esophagogastroduodenoscopy  X5   LAST ONE 07-05-2010  . Transurethral resection of bladder tumor with gyrus (turbt-gyrus) N/A 10/25/2013    Procedure: TRANSURETHRAL RESECTION OF BLADDER TUMOR WITH GYRUS (TURBT-GYRUS);  Surgeon: Magdalene Molly, MD;  Location: [facility];  Service: Urology;  Laterality: N/A;  . Cystoscopy with biopsy N/A 10/25/2013    Procedure: CYSTOSCOPY;  Surgeon: Magdalene Molly, MD;  Location: [facility];  Service: Urology;  Laterality: N/A;  . Bone biopsy Right 11/14/13     right seventh rib lesion  . Orif radial fracture Left 10/22/2014    Procedure: Intramedullary nailing left radius fracture;  Surgeon: Mack Hook, MD;  Location: [facility];  Service: Orthopedics;  Laterality: Left;  OPEN TREATMENT LEFT RADIUS FRACTURE  . Cystoscopy with retrograde pyelogram, ureteroscopy and stent placement N/A 02/21/2015    Procedure: CYSTOSCOPY, EXAM UNDER ANESTHESIA, FOLEY CATHETER PLACEMENT;  Surgeon: Jerilee Field, MD;  Location: [facility];  Service: Urology;  Laterality: N/A;    has Kappa light chain myeloma (HCC); Multiple myeloma (HCC); Anemia of chronic disease; Thrombocytopenia (HCC); Hyperkalemia; Metabolic acidosis; Bilateral hydronephrosis; Accelerated hypertension; Myeloma kidney (HCC); CKD (chronic kidney disease) stage 5, GFR less than 15 ml/min (HCC); Leukopenia due to antineoplastic  chemotherapy; Antineoplastic chemotherapy induced anemia; General weakness; and Essential hypertension on his problem list.    has No Known Allergies.    Medication List       This list is accurate as of: 03/07/15 11:59 PM.  Always use your most recent med list.               finasteride 5 MG tablet  Commonly known as:  PROSCAR  Take 1 tablet (5 mg total) by mouth daily.     hydrALAZINE 50 MG tablet  Commonly known as:   APRESOLINE  Take 1 tablet (50 mg total) by mouth every 8 (eight) hours.     HYDROcodone-acetaminophen 5-325 MG tablet  Commonly known as:  NORCO/VICODIN  Take 1-2 tablets by mouth every 4 (four) hours as needed for moderate pain.     tamsulosin 0.4 MG Caps capsule  Commonly known as:  FLOMAX  Take 1 capsule (0.4 mg total) by mouth daily.     warfarin 2 MG tablet  Commonly known as:  COUMADIN  Take 1 tablet (2 mg total) by mouth daily.         PHYSICAL EXAMINATION  Oncology Vitals 03/07/2015 02/28/2015 02/23/2015 02/23/2015 02/22/2015 02/22/2015 02/22/2015  Height 173 cm 173 cm - - - - -  Weight 88.542 kg 87.499 kg 88.678 kg - - - 89.6 kg  Weight (lbs) 195 lbs 3 oz 192 lbs 14 oz 195 lbs 8 oz - - - 197 lbs 9 oz  BMI (kg/m2) 29.68 kg/m2 29.33 kg/m2 29.73 kg/m2 - - - 30.03 kg/m2  Temp 98.1 97.8 - 99 99.6 98.1 98.4  Pulse 106 103 - 92 107 112 96  Resp 18 18 - $Re'20 22 18 18  'Xkx$ SpO2 100 100 - 100 100 96 100  BSA (m2) 2.06 m2 2.05 m2 2.06 m2 - - - 2.07 m2   BP Readings from Last 3 Encounters:  03/07/15 140/71  02/28/15 143/85  02/23/15 136/86    Physical Exam  Constitutional: He is oriented to person, place, and time and well-developed, well-nourished, and in no distress.  HENT:  Head: Normocephalic and atraumatic.  Mouth/Throat: Oropharynx is clear and moist.  Eyes: Conjunctivae and EOM are normal. Pupils are equal, round, and reactive to light. Right eye exhibits no discharge. Left eye exhibits no discharge. No scleral icterus.  Neck: Normal range of motion. Neck supple. No JVD present. No tracheal deviation present. No thyromegaly present.  Cardiovascular: Normal rate, regular rhythm, normal heart sounds and intact distal pulses.   Pulmonary/Chest: Effort normal and breath sounds normal. No respiratory distress. He has no wheezes. He has no rales. He exhibits no tenderness.  Abdominal: Soft. Bowel sounds are normal. He exhibits no distension and no mass. There is no tenderness.  There is no rebound and no guarding.  Genitourinary:  Foley cath in place.   Musculoskeletal: Normal range of motion. He exhibits no edema or tenderness.  Lymphadenopathy:    He has no cervical adenopathy.  Neurological: He is alert and oriented to person, place, and time. Gait normal.  Skin: Skin is warm and dry. No rash noted. No erythema. No pallor.  Psychiatric: Affect normal.  Nursing note and vitals reviewed.   LABORATORY DATA:. Appointment on 03/07/2015  Component Date Value Ref Range Status  . WBC 03/07/2015 4.5  4.0 - 10.3 10e3/uL Final  . NEUT# 03/07/2015 2.9  1.5 - 6.5 10e3/uL Final  . HGB 03/07/2015 9.4* 13.0 - 17.1 g/dL Final  . HCT 03/07/2015 28.6*  38.4 - 49.9 % Final  . Platelets 03/07/2015 164  140 - 400 10e3/uL Final  . MCV 03/07/2015 91.9  79.3 - 98.0 fL Final  . MCH 03/07/2015 30.4  27.2 - 33.4 pg Final  . MCHC 03/07/2015 33.1  32.0 - 36.0 g/dL Final  . RBC 03/07/2015 3.11* 4.20 - 5.82 10e6/uL Final  . RDW 03/07/2015 17.9* 11.0 - 14.6 % Final  . lymph# 03/07/2015 0.5* 0.9 - 3.3 10e3/uL Final  . MONO# 03/07/2015 0.8  0.1 - 0.9 10e3/uL Final  . Eosinophils Absolute 03/07/2015 0.2  0.0 - 0.5 10e3/uL Final  . Basophils Absolute 03/07/2015 0.0  0.0 - 0.1 10e3/uL Final  . NEUT% 03/07/2015 65.3  39.0 - 75.0 % Final  . LYMPH% 03/07/2015 10.3* 14.0 - 49.0 % Final  . MONO% 03/07/2015 18.9* 0.0 - 14.0 % Final  . EOS% 03/07/2015 4.9  0.0 - 7.0 % Final  . BASO% 03/07/2015 0.6  0.0 - 2.0 % Final  . Sodium 03/07/2015 136  136 - 145 mEq/L Final  . Potassium 03/07/2015 4.5  3.5 - 5.1 mEq/L Final  . Chloride 03/07/2015 109  98 - 109 mEq/L Final  . CO2 03/07/2015 18* 22 - 29 mEq/L Final  . Glucose 03/07/2015 128  70 - 140 mg/dl Final   Glucose reference range is for nonfasting patients. Fasting glucose reference range is 70- 100.  Marland Kitchen BUN 03/07/2015 25.7  7.0 - 26.0 mg/dL Final  . Creatinine 03/07/2015 2.0* 0.7 - 1.3 mg/dL Final  . Total Bilirubin 03/07/2015 0.58  0.20 - 1.20  mg/dL Final  . Alkaline Phosphatase 03/07/2015 80  40 - 150 U/L Final  . AST 03/07/2015 10  5 - 34 U/L Final  . ALT 03/07/2015 9  0 - 55 U/L Final  . Total Protein 03/07/2015 7.3  6.4 - 8.3 g/dL Final  . Albumin 03/07/2015 3.9  3.5 - 5.0 g/dL Final  . Calcium 03/07/2015 9.4  8.4 - 10.4 mg/dL Final  . Anion Gap 03/07/2015 9  3 - 11 mEq/L Final  . EGFR 03/07/2015 39* >90 ml/min/1.73 m2 Final   eGFR is calculated using the CKD-EPI Creatinine Equation (2009)     RADIOGRAPHIC STUDIES: No results found.  ASSESSMENT/PLAN:    Kappa light chain myeloma ([place]) Patient was admitted to the hospital on 02/17/2015; and was just discharged from the hospital on 02/23/2015 following diagnosis of acute renal failure.  Creatinine at highest level was 5.11.  Admission notes state that:  Right hydro from presumed urinary retention and large prostate (~300 g) - s/p foley placement.  Left hydro - from RP plasma cell involvement. Improved on recent CT. BPH - cont tamsulosin and finasteride Incomplete bladder emptying - continue foley. Would anticipate leaving in at discharge with f/u in office next 1 - 2 weeks for void trial and Cr recheck.          Patient presented to the [facility] last week on 02/28/2015 with plan to receive his next Velcade injection and to initiate his next cycle of Revlimid oral therapy.  Since he was just recovering from his recent admission-decision was made at that time to hold both the Velcade and the Revlimid.  Patient returns today for follow-up.  He states that he continues to feel very well.  He just met with his urologist, Dr. Junious Silk yesterday; and his Foley catheter was removed.  He states he has been urinating with no further difficulties; he denies both dysuria and hematuria.  He also denies  any recent fevers or chills.  On exam.-Patient appears well and nontoxic.  Labs obtained today reveal a WBC of 4.5, ANC 2.9, hemoglobin 9.4, platelet counts 164.  Vitals  are stable with a temperature of 97.8.  Also, confirmed the patient will continue with low-dose Coumadin 2 mg daily due to increased risk of blood clots secondary to Revlimid use.  Patient will proceed with cycle 11 of his Velcade injections today.  He was advised to hold any further Revlimid oral therapy for the next 2 weeks.  Patient will need to be scheduled for labs and his Velcade injection on Thursday, 03/14/2015.  Patient will then return to the [place] [facility] on 03/21/2015 for labs, visit, and his next Velcade injection.    Myeloma kidney [facility]) Patient was admitted to the hospital on 02/17/2015; and was just discharged from the hospital on 02/23/2015 following diagnosis of acute renal failure.  Creatinine at highest level was 5.11.  Admission notes state that:  Right hydro from presumed urinary retention and large prostate (~300 g) - s/p foley placement.  Left hydro - from RP plasma cell involvement. Improved on recent CT. BPH - cont tamsulosin and finasteride Incomplete bladder emptying - continue foley. Would anticipate leaving in at discharge with f/u in office next 1 - 2 weeks for void trial and Cr recheck.          Patient presented to the [facility] last week on 02/28/2015 with plan to receive his next Velcade injection and to initiate his next cycle of Revlimid oral therapy.  Since he was just recovering from his recent admission-decision was made at that time to hold both the Velcade and the Revlimid.  Patient returns today for follow-up.  He states that he continues to feel very well.  He just met with his urologist, Dr. Junious Silk yesterday; and his Foley catheter was removed.  He states he has been urinating with no further difficulties; he denies both dysuria and hematuria.  He also denies any recent fevers or chills.  On exam.-Patient appears well and nontoxic.  Labs obtained today reveal a WBC of 4.5, ANC 2.9, hemoglobin 9.4, platelet counts 164.  Vitals are  stable with a temperature of 97.8.  Also, confirmed the patient will continue with low-dose Coumadin 2 mg daily due to increased risk of blood clots secondary to Revlimid use.  Patient will proceed with cycle 11 of his Velcade injections today.  He was advised to hold any further Revlimid oral therapy for the next 2 weeks.  Patient will need to be scheduled for labs and his Velcade injection on Thursday, 03/14/2015.  Patient will then return to the [facility] on 03/21/2015 for labs, visit, and his next Velcade injection.   Patient stated understanding of all instructions; and was in agreement with this plan of care. The patient knows to call the clinic with any problems, questions or concerns.   All details of today's visit were reviewed with Dr. [name].   Total time spent with patient was 25 minutes;  with greater than 75 percent of that time spent in face to face counseling regarding patient's symptoms,  and coordination of care and follow up.  Disclaimer:This dictation was prepared with Dragon/digital dictation along with [employer]. Any transcriptional errors that result from this process are unintentional.  Drue Second, NP 03/08/2015

## 2015-03-08 NOTE — Telephone Encounter (Signed)
Per staff message and POF I have scheduled appts. Advised scheduler of appts and to move labs. JMW  

## 2015-03-08 NOTE — Assessment & Plan Note (Signed)
Patient was admitted to the hospital on 02/17/2015; and was just discharged from the hospital on 02/23/2015 following diagnosis of acute renal failure.  Creatinine at highest level was 5.11.  Admission notes state that:  Right hydro from presumed urinary retention and large prostate (~300 g) - s/p foley placement.  Left hydro - from RP plasma cell involvement. Improved on recent CT. BPH - cont tamsulosin and finasteride Incomplete bladder emptying - continue foley. Would anticipate leaving in at discharge with f/u in office next 1 - 2 weeks for void trial and Cr recheck.          Patient presented to the cancer Center last week on 02/28/2015 with plan to receive his next Velcade injection and to initiate his next cycle of Revlimid oral therapy.  Since he was just recovering from his recent admission-decision was made at that time to hold both the Velcade and the Revlimid.  Patient returns today for follow-up.  He states that he continues to feel very well.  He just met with his urologist, Dr. Eskridge yesterday; and his Foley catheter was removed.  He states he has been urinating with no further difficulties; he denies both dysuria and hematuria.  He also denies any recent fevers or chills.  On exam.-Patient appears well and nontoxic.  Labs obtained today reveal a WBC of 4.5, ANC 2.9, hemoglobin 9.4, platelet counts 164.  Vitals are stable with a temperature of 97.8.  Also, confirmed the patient will continue with low-dose Coumadin 2 mg daily due to increased risk of blood clots secondary to Revlimid use.  Patient will proceed with cycle 11 of his Velcade injections today.  He was advised to hold any further Revlimid oral therapy for the next 2 weeks.  Patient will need to be scheduled for labs and his Velcade injection on Thursday, 03/14/2015.  Patient will then return to the cancer Center on 03/21/2015 for labs, visit, and his next Velcade injection. 

## 2015-03-08 NOTE — Assessment & Plan Note (Signed)
Patient was admitted to the hospital on 02/17/2015; and was just discharged from the hospital on 02/23/2015 following diagnosis of acute renal failure.  Creatinine at highest level was 5.11.  Admission notes state that:  Right hydro from presumed urinary retention and large prostate (~300 g) - s/p foley placement.  Left hydro - from RP plasma cell involvement. Improved on recent CT. BPH - cont tamsulosin and finasteride Incomplete bladder emptying - continue foley. Would anticipate leaving in at discharge with f/u in office next 1 - 2 weeks for void trial and Cr recheck.          Patient presented to the [facility] last week on 02/28/2015 with plan to receive his next Velcade injection and to initiate his next cycle of Revlimid oral therapy.  Since he was just recovering from his recent admission-decision was made at that time to hold both the Velcade and the Revlimid.  Patient returns today for follow-up.  He states that he continues to feel very well.  He just met with his urologist, Dr. Junious Silk yesterday; and his Foley catheter was removed.  He states he has been urinating with no further difficulties; he denies both dysuria and hematuria.  He also denies any recent fevers or chills.  On exam.-Patient appears well and nontoxic.  Labs obtained today reveal a WBC of 4.5, ANC 2.9, hemoglobin 9.4, platelet counts 164.  Vitals are stable with a temperature of 97.8.  Also, confirmed the patient will continue with low-dose Coumadin 2 mg daily due to increased risk of blood clots secondary to Revlimid use.  Patient will proceed with cycle 11 of his Velcade injections today.  He was advised to hold any further Revlimid oral therapy for the next 2 weeks.  Patient will need to be scheduled for labs and his Velcade injection on Thursday, 03/14/2015.  Patient will then return to the [facility] on 03/21/2015 for labs, visit, and his next Velcade injection.

## 2015-03-08 NOTE — Telephone Encounter (Signed)
Fax received from [facility] for Revlimid Refill. 11/4 progress note from [employer],. NP- pt to hold Revlimid x 2 months./

## 2015-03-14 ENCOUNTER — Ambulatory Visit (HOSPITAL_BASED_OUTPATIENT_CLINIC_OR_DEPARTMENT_OTHER): Payer: Medicare HMO

## 2015-03-14 ENCOUNTER — Other Ambulatory Visit (HOSPITAL_BASED_OUTPATIENT_CLINIC_OR_DEPARTMENT_OTHER): Payer: Medicare HMO

## 2015-03-14 VITALS — BP 146/87 | HR 99 | Temp 97.6°F | Resp 20

## 2015-03-14 DIAGNOSIS — C9000 Multiple myeloma not having achieved remission: Secondary | ICD-10-CM

## 2015-03-14 DIAGNOSIS — Z5112 Encounter for antineoplastic immunotherapy: Secondary | ICD-10-CM

## 2015-03-14 LAB — CBC WITH DIFFERENTIAL/PLATELET
BASO%: 0.4 % (ref 0.0–2.0)
Basophils Absolute: 0 10*3/uL (ref 0.0–0.1)
EOS ABS: 0.1 10*3/uL (ref 0.0–0.5)
EOS%: 1.5 % (ref 0.0–7.0)
HCT: 25.6 % — ABNORMAL LOW (ref 38.4–49.9)
HGB: 8.6 g/dL — ABNORMAL LOW (ref 13.0–17.1)
LYMPH%: 19.7 % (ref 14.0–49.0)
MCH: 30.2 pg (ref 27.2–33.4)
MCHC: 33.6 g/dL (ref 32.0–36.0)
MCV: 89.8 fL (ref 79.3–98.0)
MONO#: 1.2 10*3/uL — AB (ref 0.1–0.9)
MONO%: 25.2 % — AB (ref 0.0–14.0)
NEUT%: 53.2 % (ref 39.0–75.0)
NEUTROS ABS: 2.4 10*3/uL (ref 1.5–6.5)
PLATELETS: 157 10*3/uL (ref 140–400)
RBC: 2.85 10*6/uL — ABNORMAL LOW (ref 4.20–5.82)
RDW: 16.6 % — ABNORMAL HIGH (ref 11.0–14.6)
WBC: 4.6 10*3/uL (ref 4.0–10.3)
lymph#: 0.9 10*3/uL (ref 0.9–3.3)

## 2015-03-14 LAB — COMPREHENSIVE METABOLIC PANEL (CC13)
ALK PHOS: 72 U/L (ref 40–150)
ALT: 12 U/L (ref 0–55)
ANION GAP: 12 meq/L — AB (ref 3–11)
AST: 11 U/L (ref 5–34)
Albumin: 3.5 g/dL (ref 3.5–5.0)
BUN: 24 mg/dL (ref 7.0–26.0)
CALCIUM: 9.4 mg/dL (ref 8.4–10.4)
CHLORIDE: 108 meq/L (ref 98–109)
CO2: 18 mEq/L — ABNORMAL LOW (ref 22–29)
Creatinine: 2 mg/dL — ABNORMAL HIGH (ref 0.7–1.3)
EGFR: 40 mL/min/{1.73_m2} — AB (ref >90)
Glucose: 104 mg/dl (ref 70–140)
POTASSIUM: 4.6 meq/L (ref 3.5–5.1)
Sodium: 138 mEq/L (ref 136–145)
Total Bilirubin: 0.65 mg/dL (ref 0.20–1.20)
Total Protein: 7.2 g/dL (ref 6.4–8.3)

## 2015-03-14 MED ORDER — ONDANSETRON HCL 8 MG PO TABS
ORAL_TABLET | ORAL | Status: AC
  Filled 2015-03-14: qty 1

## 2015-03-14 MED ORDER — BORTEZOMIB CHEMO SQ INJECTION 3.5 MG (2.5MG/ML)
1.3000 mg/m2 | Freq: Once | INTRAMUSCULAR | Status: AC
  Administered 2015-03-14: 2.75 mg via SUBCUTANEOUS
  Filled 2015-03-14: qty 2.75

## 2015-03-14 MED ORDER — ONDANSETRON HCL 8 MG PO TABS
8.0000 mg | ORAL_TABLET | Freq: Once | ORAL | Status: AC
  Administered 2015-03-14: 8 mg via ORAL

## 2015-03-14 NOTE — Progress Notes (Signed)
Verbal consent given by Dr. [name] to treat pt today with Creat at 2.0

## 2015-03-14 NOTE — Patient Instructions (Signed)
[facility] Discharge Instructions for Patients Receiving Chemotherapy  Today you received the following chemotherapy agents: Velcade.  To help prevent nausea and vomiting after your treatment, we encourage you to take your nausea medication: Compazine 10 mg every 6 hours as needed.   If you develop nausea and vomiting that is not controlled by your nausea medication, call the clinic.   BELOW ARE SYMPTOMS THAT SHOULD BE REPORTED IMMEDIATELY:  *FEVER GREATER THAN 100.5 F  *CHILLS WITH OR WITHOUT FEVER  NAUSEA AND VOMITING THAT IS NOT CONTROLLED WITH YOUR NAUSEA MEDICATION  *UNUSUAL SHORTNESS OF BREATH  *UNUSUAL BRUISING OR BLEEDING  TENDERNESS IN MOUTH AND THROAT WITH OR WITHOUT PRESENCE OF ULCERS  *URINARY PROBLEMS  *BOWEL PROBLEMS  UNUSUAL RASH Items with * indicate a potential emergency and should be followed up as soon as possible.  Feel free to call the clinic you have any questions or concerns. The clinic phone number is [phone].  Please show the CHEMO ALERT CARD at check-in to the Emergency Department and triage nurse.   

## 2015-03-15 ENCOUNTER — Encounter: Payer: Self-pay | Admitting: Internal Medicine

## 2015-03-15 NOTE — Progress Notes (Signed)
Per mary and notes-revlimid is on hold. I called to let [facility] know no new script will be sent.

## 2015-03-20 ENCOUNTER — Inpatient Hospital Stay (HOSPITAL_COMMUNITY)
Admission: EM | Admit: 2015-03-20 | Discharge: 2015-03-25 | DRG: 872 | Disposition: A | Discharge status: Home / Self Care | Payer: Medicare HMO | Attending: Internal Medicine | Admitting: Internal Medicine

## 2015-03-20 ENCOUNTER — Emergency Department (HOSPITAL_COMMUNITY): Payer: Medicare HMO

## 2015-03-20 ENCOUNTER — Telehealth: Payer: Self-pay | Admitting: *Deleted

## 2015-03-20 ENCOUNTER — Encounter (HOSPITAL_COMMUNITY): Payer: Self-pay | Admitting: Emergency Medicine

## 2015-03-20 DIAGNOSIS — Z79891 Long term (current) use of opiate analgesic: Secondary | ICD-10-CM

## 2015-03-20 DIAGNOSIS — Z8249 Family history of ischemic heart disease and other diseases of the circulatory system: Secondary | ICD-10-CM

## 2015-03-20 DIAGNOSIS — A4152 Sepsis due to Pseudomonas: Secondary | ICD-10-CM | POA: Diagnosis present

## 2015-03-20 DIAGNOSIS — Z87891 Personal history of nicotine dependence: Secondary | ICD-10-CM

## 2015-03-20 DIAGNOSIS — Z8551 Personal history of malignant neoplasm of bladder: Secondary | ICD-10-CM

## 2015-03-20 DIAGNOSIS — R911 Solitary pulmonary nodule: Secondary | ICD-10-CM | POA: Diagnosis present

## 2015-03-20 DIAGNOSIS — R509 Fever, unspecified: Secondary | ICD-10-CM

## 2015-03-20 DIAGNOSIS — D6481 Anemia due to antineoplastic chemotherapy: Secondary | ICD-10-CM | POA: Diagnosis present

## 2015-03-20 DIAGNOSIS — D899 Disorder involving the immune mechanism, unspecified: Secondary | ICD-10-CM | POA: Diagnosis present

## 2015-03-20 DIAGNOSIS — T451X5A Adverse effect of antineoplastic and immunosuppressive drugs, initial encounter: Secondary | ICD-10-CM | POA: Diagnosis present

## 2015-03-20 DIAGNOSIS — Z79899 Other long term (current) drug therapy: Secondary | ICD-10-CM

## 2015-03-20 DIAGNOSIS — Z923 Personal history of irradiation: Secondary | ICD-10-CM

## 2015-03-20 DIAGNOSIS — Z8042 Family history of malignant neoplasm of prostate: Secondary | ICD-10-CM

## 2015-03-20 DIAGNOSIS — N1330 Unspecified hydronephrosis: Secondary | ICD-10-CM

## 2015-03-20 DIAGNOSIS — Q759 Congenital malformation of skull and face bones, unspecified: Secondary | ICD-10-CM

## 2015-03-20 DIAGNOSIS — R222 Localized swelling, mass and lump, trunk: Secondary | ICD-10-CM | POA: Diagnosis present

## 2015-03-20 DIAGNOSIS — N138 Other obstructive and reflux uropathy: Secondary | ICD-10-CM | POA: Diagnosis present

## 2015-03-20 DIAGNOSIS — C9000 Multiple myeloma not having achieved remission: Secondary | ICD-10-CM | POA: Diagnosis present

## 2015-03-20 DIAGNOSIS — N401 Enlarged prostate with lower urinary tract symptoms: Secondary | ICD-10-CM | POA: Diagnosis present

## 2015-03-20 DIAGNOSIS — I120 Hypertensive chronic kidney disease with stage 5 chronic kidney disease or end stage renal disease: Secondary | ICD-10-CM | POA: Diagnosis present

## 2015-03-20 DIAGNOSIS — R1900 Intra-abdominal and pelvic swelling, mass and lump, unspecified site: Secondary | ICD-10-CM

## 2015-03-20 DIAGNOSIS — N185 Chronic kidney disease, stage 5: Secondary | ICD-10-CM | POA: Diagnosis present

## 2015-03-20 DIAGNOSIS — N08 Glomerular disorders in diseases classified elsewhere: Secondary | ICD-10-CM

## 2015-03-20 DIAGNOSIS — N320 Bladder-neck obstruction: Secondary | ICD-10-CM | POA: Diagnosis present

## 2015-03-20 DIAGNOSIS — Z7901 Long term (current) use of anticoagulants: Secondary | ICD-10-CM

## 2015-03-20 DIAGNOSIS — D638 Anemia in other chronic diseases classified elsewhere: Secondary | ICD-10-CM | POA: Diagnosis present

## 2015-03-20 DIAGNOSIS — R531 Weakness: Secondary | ICD-10-CM | POA: Diagnosis present

## 2015-03-20 DIAGNOSIS — N390 Urinary tract infection, site not specified: Secondary | ICD-10-CM

## 2015-03-20 DIAGNOSIS — B952 Enterococcus as the cause of diseases classified elsewhere: Secondary | ICD-10-CM | POA: Diagnosis present

## 2015-03-20 DIAGNOSIS — A419 Sepsis, unspecified organism: Secondary | ICD-10-CM

## 2015-03-20 DIAGNOSIS — Z9221 Personal history of antineoplastic chemotherapy: Secondary | ICD-10-CM

## 2015-03-20 DIAGNOSIS — I10 Essential (primary) hypertension: Secondary | ICD-10-CM | POA: Diagnosis present

## 2015-03-20 LAB — CBC WITH DIFFERENTIAL/PLATELET
BASOS ABS: 0 10*3/uL (ref 0.0–0.1)
Basophils Relative: 0 %
EOS PCT: 0 %
Eosinophils Absolute: 0 10*3/uL (ref 0.0–0.7)
HEMATOCRIT: 25.1 % — AB (ref 39.0–52.0)
HEMOGLOBIN: 8.5 g/dL — AB (ref 13.0–17.0)
LYMPHS ABS: 0.6 10*3/uL — AB (ref 0.7–4.0)
LYMPHS PCT: 6 %
MCH: 30.9 pg (ref 26.0–34.0)
MCHC: 33.9 g/dL (ref 30.0–36.0)
MCV: 91.3 fL (ref 78.0–100.0)
Monocytes Absolute: 1.7 10*3/uL — ABNORMAL HIGH (ref 0.1–1.0)
Monocytes Relative: 15 %
NEUTROS ABS: 8.9 10*3/uL — AB (ref 1.7–7.7)
Neutrophils Relative %: 79 %
PLATELETS: 201 10*3/uL (ref 150–400)
RBC: 2.75 MIL/uL — AB (ref 4.22–5.81)
RDW: 17.1 % — ABNORMAL HIGH (ref 11.5–15.5)
WBC: 11.2 10*3/uL — AB (ref 4.0–10.5)

## 2015-03-20 LAB — URINALYSIS, ROUTINE W REFLEX MICROSCOPIC
Bilirubin Urine: NEGATIVE
GLUCOSE, UA: NEGATIVE mg/dL
HGB URINE DIPSTICK: NEGATIVE
Ketones, ur: NEGATIVE mg/dL
Nitrite: POSITIVE — AB
PH: 6 (ref 5.0–8.0)
Protein, ur: NEGATIVE mg/dL
SPECIFIC GRAVITY, URINE: 1.005 (ref 1.005–1.030)

## 2015-03-20 LAB — COMPREHENSIVE METABOLIC PANEL
ALBUMIN: 3.8 g/dL (ref 3.5–5.0)
ALT: 26 U/L (ref 17–63)
ANION GAP: 9 (ref 5–15)
AST: 32 U/L (ref 15–41)
Alkaline Phosphatase: 65 U/L (ref 38–126)
BUN: 30 mg/dL — AB (ref 6–20)
CHLORIDE: 106 mmol/L (ref 101–111)
CO2: 20 mmol/L — ABNORMAL LOW (ref 22–32)
Calcium: 9 mg/dL (ref 8.9–10.3)
Creatinine, Ser: 2.03 mg/dL — ABNORMAL HIGH (ref 0.61–1.24)
GFR calc Af Amer: 37 mL/min — ABNORMAL LOW (ref >60)
GFR, EST NON AFRICAN AMERICAN: 32 mL/min — AB (ref >60)
Glucose, Bld: 121 mg/dL — ABNORMAL HIGH (ref 65–99)
POTASSIUM: 4.3 mmol/L (ref 3.5–5.1)
Sodium: 135 mmol/L (ref 135–145)
TOTAL PROTEIN: 7.4 g/dL (ref 6.5–8.1)
Total Bilirubin: 1 mg/dL (ref 0.3–1.2)

## 2015-03-20 LAB — APTT: aPTT: 37 seconds (ref 24–37)

## 2015-03-20 LAB — URINE MICROSCOPIC-ADD ON

## 2015-03-20 LAB — I-STAT CG4 LACTIC ACID, ED: Lactic Acid, Venous: 0.76 mmol/L (ref 0.5–2.0)

## 2015-03-20 LAB — PROCALCITONIN: PROCALCITONIN: 0.37 ng/mL

## 2015-03-20 LAB — PROTIME-INR
INR: 1.16 (ref 0.00–1.49)
Prothrombin Time: 15 seconds (ref 11.6–15.2)

## 2015-03-20 MED ORDER — SODIUM CHLORIDE 0.9 % IV SOLN
2000.0000 mg | INTRAVENOUS | Status: AC
  Administered 2015-03-20: 2000 mg via INTRAVENOUS
  Filled 2015-03-20: qty 2000

## 2015-03-20 MED ORDER — HYDROCODONE-ACETAMINOPHEN 5-325 MG PO TABS
1.0000 | ORAL_TABLET | ORAL | Status: DC | PRN
  Administered 2015-03-21: 2 via ORAL
  Filled 2015-03-20: qty 2

## 2015-03-20 MED ORDER — TAMSULOSIN HCL 0.4 MG PO CAPS
0.4000 mg | ORAL_CAPSULE | Freq: Every day | ORAL | Status: DC
  Administered 2015-03-21 – 2015-03-25 (×5): 0.4 mg via ORAL
  Filled 2015-03-20 (×5): qty 1

## 2015-03-20 MED ORDER — SODIUM CHLORIDE 0.9 % IJ SOLN
3.0000 mL | Freq: Two times a day (BID) | INTRAMUSCULAR | Status: DC
  Administered 2015-03-20 – 2015-03-25 (×5): 3 mL via INTRAVENOUS

## 2015-03-20 MED ORDER — FINASTERIDE 5 MG PO TABS
5.0000 mg | ORAL_TABLET | Freq: Every day | ORAL | Status: DC
  Administered 2015-03-21 – 2015-03-25 (×5): 5 mg via ORAL
  Filled 2015-03-20 (×5): qty 1

## 2015-03-20 MED ORDER — HYDRALAZINE HCL 20 MG/ML IJ SOLN: 5.0000 mg | INTRAMUSCULAR | Status: DC | PRN

## 2015-03-20 MED ORDER — SODIUM CHLORIDE 0.9 % IV BOLUS (SEPSIS)
2000.0000 mL | Freq: Once | INTRAVENOUS | Status: AC
  Administered 2015-03-20: 2000 mL via INTRAVENOUS

## 2015-03-20 MED ORDER — ONDANSETRON HCL 4 MG/2ML IJ SOLN: 4.0000 mg | Freq: Four times a day (QID) | INTRAMUSCULAR | Status: DC | PRN

## 2015-03-20 MED ORDER — PIPERACILLIN-TAZOBACTAM 3.375 G IVPB 30 MIN
3.3750 g | INTRAVENOUS | Status: AC
  Administered 2015-03-20: 3.375 g via INTRAVENOUS
  Filled 2015-03-20: qty 50

## 2015-03-20 MED ORDER — VANCOMYCIN HCL 10 G IV SOLR: 1250.0000 mg | INTRAVENOUS | Status: DC

## 2015-03-20 MED ORDER — ACETAMINOPHEN 325 MG PO TABS
650.0000 mg | ORAL_TABLET | Freq: Once | ORAL | Status: AC
  Administered 2015-03-20: 650 mg via ORAL
  Filled 2015-03-20: qty 2

## 2015-03-20 MED ORDER — WARFARIN - PHYSICIAN DOSING INPATIENT
Freq: Every day | Status: DC
  Administered 2015-03-21 – 2015-03-24 (×3)

## 2015-03-20 MED ORDER — HYDRALAZINE HCL 50 MG PO TABS
50.0000 mg | ORAL_TABLET | Freq: Three times a day (TID) | ORAL | Status: DC
  Administered 2015-03-20 – 2015-03-25 (×14): 50 mg via ORAL
  Filled 2015-03-20 (×14): qty 1

## 2015-03-20 MED ORDER — WARFARIN SODIUM 2 MG PO TABS
2.0000 mg | ORAL_TABLET | Freq: Every day | ORAL | Status: DC
  Administered 2015-03-21 – 2015-03-25 (×5): 2 mg via ORAL
  Filled 2015-03-20 (×5): qty 1

## 2015-03-20 MED ORDER — ONDANSETRON HCL 4 MG PO TABS: 4.0000 mg | ORAL_TABLET | Freq: Four times a day (QID) | ORAL | Status: DC | PRN

## 2015-03-20 MED ORDER — HYDROMORPHONE HCL 1 MG/ML IJ SOLN
1.0000 mg | Freq: Once | INTRAMUSCULAR | Status: AC
Start: 2015-03-20 — End: 2015-03-20
  Administered 2015-03-20: 1 mg via INTRAVENOUS
  Filled 2015-03-20: qty 1

## 2015-03-20 MED ORDER — PIPERACILLIN-TAZOBACTAM 3.375 G IVPB
3.3750 g | Freq: Three times a day (TID) | INTRAVENOUS | Status: DC
  Administered 2015-03-21 – 2015-03-25 (×13): 3.375 g via INTRAVENOUS
  Filled 2015-03-20 (×13): qty 50

## 2015-03-20 MED ORDER — HYDROMORPHONE HCL 1 MG/ML IJ SOLN: 1.0000 mg | INTRAMUSCULAR | Status: DC | PRN

## 2015-03-20 NOTE — ED Provider Notes (Signed)
CSN: [id number]     Arrival date & time 03/20/15  1605 History   First MD Initiated Contact with Patient 03/20/15 1613     Chief Complaint  Patient presents with  . ca pt, fever, HTN      (Consider location/radiation/quality/duration/timing/severity/associated sxs/prior Treatment) HPI Wesley [name] is a 68 y.o. male with history of multiple myeloma on chemotherapy, comes in for evaluation of fever and high blood pressure. Patient was recently admitted to the hospital on 10/16 for hypertension and weakness and found to have bilateral hydronephrosis. Discharged on 10/22. He presents today for evaluation of high blood pressure. He is accompanied by his wife who contributes history of present illness. They report he woke up this morning and had a blood pressure" in the 180s". He otherwise reports that he feels well. He does report that he has had a slight cough over the past couple of days. He denies fevers at home, chest pain, cough, nausea or vomiting, abdominal pain, urinary symptoms, rectal pain, diarrhea or constipation, leg swelling, confusion, rash. He denies any discomfort now in the ED. Last chemotherapy injection, Velcade on 11/11  Past Medical History  Diagnosis Date  . Pulmonary nodule, right     W/  CHEST WALL MASS--  SCHEDULED FOR BIOPSY W/ DR Kendrick Fries ON FRIDAY 10-27-2013  . History of seizures as a child     AGE 59 OR 8--- NONE ISSUE SINCE  . History of GI bleed     SECONDARY TO ULCER --  DEC 2011  RESOLVED PER LAST EGD 07-05-2010  . Bladder tumor   . Hematuria   . Radiation 12/07/13-12/27/13    right anterior chest 37.5 gray  . Fracture of radius 09/29/14    left  . Cancer [facility])     multiple myeloma, bladder cancer 2015   Past Surgical History  Procedure Laterality Date  . Excision benign left axilla tumor  1993  . Esophagogastroduodenoscopy  X5   LAST ONE 07-05-2010  . Transurethral resection of bladder tumor with gyrus (turbt-gyrus) N/A 10/25/2013    Procedure:  TRANSURETHRAL RESECTION OF BLADDER TUMOR WITH GYRUS (TURBT-GYRUS);  Surgeon: Magdalene Molly, MD;  Location: [facility];  Service: Urology;  Laterality: N/A;  . Cystoscopy with biopsy N/A 10/25/2013    Procedure: CYSTOSCOPY;  Surgeon: Magdalene Molly, MD;  Location: [facility];  Service: Urology;  Laterality: N/A;  . Bone biopsy Right 11/14/13     right seventh rib lesion  . Orif radial fracture Left 10/22/2014    Procedure: Intramedullary nailing left radius fracture;  Surgeon: Mack Hook, MD;  Location: [facility];  Service: Orthopedics;  Laterality: Left;  OPEN TREATMENT LEFT RADIUS FRACTURE  . Cystoscopy with retrograde pyelogram, ureteroscopy and stent placement N/A 02/21/2015    Procedure: CYSTOSCOPY, EXAM UNDER ANESTHESIA, FOLEY CATHETER PLACEMENT;  Surgeon: Jerilee Field, MD;  Location: [facility];  Service: Urology;  Laterality: N/A;   Family History  Problem Relation Age of Onset  . Heart disease Mother   . Cancer - Prostate Brother    Social History  Substance Use Topics  . Smoking status: Former Smoker -- 0.10 packs/day for 1 years    Types: Cigarettes    Quit date: 05/04/1969  . Smokeless tobacco: Never Used  . Alcohol Use: No    Review of Systems A 10 point review of systems was completed and was negative except for pertinent positives and negatives as mentioned in the history of present illness  Allergies  Review of patient's allergies indicates no known allergies.  Home Medications   Prior to Admission medications   Medication Sig Start Date End Date Taking? Authorizing Provider  Bortezomib (VELCADE IJ) Inject as directed. Infusion at [facility]   Yes Historical Provider, MD  finasteride (PROSCAR) 5 MG tablet Take 1 tablet (5 mg total) by mouth daily. 02/23/15  Yes Theodis Blaze, MD  hydrALAZINE (APRESOLINE) 50 MG tablet Take 1 tablet (50 mg total) by mouth every 8 (eight) hours. 02/23/15  Yes Theodis Blaze, MD   HYDROcodone-acetaminophen (NORCO/VICODIN) 5-325 MG tablet Take 1-2 tablets by mouth every 4 (four) hours as needed for moderate pain. 02/23/15  Yes Theodis Blaze, MD  tamsulosin (FLOMAX) 0.4 MG CAPS capsule Take 1 capsule (0.4 mg total) by mouth daily. 02/23/15  Yes Theodis Blaze, MD  warfarin (COUMADIN) 2 MG tablet Take 1 tablet (2 mg total) by mouth daily. 01/31/15  Yes [name], MD   BP 145/86 mmHg  Pulse 105  Temp(Src) 100.3 F (37.9 C) (Oral)  Resp 19  Wt 195 lb (88.451 kg)  SpO2 97% Physical Exam  Constitutional: He is oriented to person, place, and time. He appears well-developed and well-nourished. No distress.  Well appearing African-American male.  HENT:  Head: Normocephalic and atraumatic.  Mouth/Throat: Oropharynx is clear and moist.  Eyes: Conjunctivae are normal. Pupils are equal, round, and reactive to light. Right eye exhibits no discharge. Left eye exhibits no discharge. No scleral icterus.  Neck: Normal range of motion. Neck supple.  Cardiovascular: Normal rate, regular rhythm and normal heart sounds.   Pulmonary/Chest: Effort normal and breath sounds normal. No respiratory distress. He has no wheezes. He has no rales.  Abdominal: Soft. There is no tenderness.  Musculoskeletal: Normal range of motion. He exhibits no edema or tenderness.  Neurological: He is alert and oriented to person, place, and time.  Cranial Nerves II-XII grossly intact  Skin: Skin is warm and dry. No rash noted. He is not diaphoretic.  Psychiatric: He has a normal mood and affect.  Nursing note and vitals reviewed.   ED Course  Procedures (including critical care time) Labs Review Labs Reviewed  COMPREHENSIVE METABOLIC PANEL - Abnormal; Notable for the following:    CO2 20 (*)    Glucose, Bld 121 (*)    BUN 30 (*)    Creatinine, Ser 2.03 (*)    GFR calc non Af Amer 32 (*)    GFR calc Af Amer 37 (*)    All other components within normal limits  CBC WITH DIFFERENTIAL/PLATELET -  Abnormal; Notable for the following:    WBC 11.2 (*)    RBC 2.75 (*)    Hemoglobin 8.5 (*)    HCT 25.1 (*)    RDW 17.1 (*)    Neutro Abs 8.9 (*)    Lymphs Abs 0.6 (*)    Monocytes Absolute 1.7 (*)    All other components within normal limits  URINALYSIS, ROUTINE W REFLEX MICROSCOPIC (NOT AT [facility]) - Abnormal; Notable for the following:    APPearance CLOUDY (*)    Nitrite POSITIVE (*)    Leukocytes, UA LARGE (*)    All other components within normal limits  URINE MICROSCOPIC-ADD ON - Abnormal; Notable for the following:    Squamous Epithelial / LPF 0-5 (*)    Bacteria, UA MANY (*)    All other components within normal limits  CULTURE, BLOOD (ROUTINE X 2)  CULTURE, BLOOD (ROUTINE X 2)  URINE CULTURE  APTT  PROTIME-INR  I-STAT CG4 LACTIC ACID, ED    Imaging Review Ct Abdomen Pelvis Wo Contrast  03/20/2015  CLINICAL DATA:  Fever, h/o bilat hydronephrosis, bladder tumor diagnosed 2015 EXAM: CT ABDOMEN AND PELVIS WITHOUT CONTRAST TECHNIQUE: Multidetector CT imaging of the abdomen and pelvis was performed following the standard protocol without IV contrast. COMPARISON:  02/17/2015 FINDINGS: 1 cm subpleural nodule in the posterolateral left lower lobe, stable. New 6 mm subpleural nodule in the posterolateral right lower lobe image 2/6, and and several proximal less sharply marginated nodular densities in the posterior basal segment right lower lobe. Mixed lytic/sclerotic lesion in the lateral aspect right seventh rib as before. Unremarkable liver, nondilated gallbladder, spleen, adrenal glands, pancreas. Unenhanced CT was performed per clinician order. Lack of IV contrast limits sensitivity and specificity, especially for evaluation of abdominal/pelvic solid viscera. Moderate right hydronephrosis and ureterectasis down to the ureteral orifice as before. The left hydronephrosis has improved. Left renal parenchymal atrophy. 5 cm mass medial to the left kidney abutting left renal vein, previously  2.6 cm. Stomach is decompressed. Small bowel nondilated. Normal appendix. Moderate proximal colonic fecal material, decompressed distally. Distended urinary bladder with diffuse wall thickening. The mastoid there is massive prostatic enlargement, protruding into the lumen of the urinary bladder in a lobular fashion. Bilateral pelvic phleboliths. Patchy iliac arterial calcifications. No ascites. No free air. No adenopathy localized. Facet DJD in the lower lumbar spine. IMPRESSION: 1. New right lower lobe pulmonary nodule. 2. 5 cm left retroperitoneal mass at the level of the left renal vein, previously 2.6 cm. 3. Persistent moderate right hydronephrosis and ureterectasis. 4. Marked prostatic enlargement protruding into the lumen of the urinary bladder, with wall thickening suggesting a degree of bladder outlet obstruction. 5. Stable right seventh rib lytic/sclerotic lesion. No new bone lesions identified. Electronically Signed   By: Lucrezia Europe M.D.   On: 03/20/2015 17:17   I have personally reviewed and evaluated these images and lab results as part of my medical decision-making.   EKG Interpretation None     Meds given in ED:  Medications  vancomycin (VANCOCIN) 2,000 mg in sodium chloride 0.9 % 500 mL IVPB (2,000 mg Intravenous New Bag/Given 03/20/15 1758)  vancomycin (VANCOCIN) 1,250 mg in sodium chloride 0.9 % 250 mL IVPB (not administered)  piperacillin-tazobactam (ZOSYN) IVPB 3.375 g (not administered)  acetaminophen (TYLENOL) tablet 650 mg (650 mg Oral Given 03/20/15 1630)  sodium chloride 0.9 % bolus 2,000 mL (2,000 mLs Intravenous New Bag/Given 03/20/15 1649)  piperacillin-tazobactam (ZOSYN) IVPB 3.375 g (3.375 g Intravenous New Bag/Given 03/20/15 1730)  HYDROmorphone (DILAUDID) injection 1 mg (1 mg Intravenous Given 03/20/15 1758)    New Prescriptions   No medications on file   Filed Vitals:   03/20/15 1650 03/20/15 1658 03/20/15 1718 03/20/15 1802  BP:   139/88 145/86  Pulse: 118   110 105  Temp:   100.3 F (37.9 C)   TempSrc:   Oral   Resp: $Remo'25  22 19  'vUahw$ Weight:  195 lb (88.451 kg)    SpO2: 98%  94% 97%    MDM  Kyle Luppino is a 69 y.o. male with history of multiple myeloma, recent admission for bilateral hydronephrosis. On arrival, patient is tachycardic and febrile to 102.78F. He was given Tylenol and started on 2 L of normal saline. Patient overall appears well and denies any overt symptoms suggestive of infection. Last chemotherapy injection was one week ago. Basic labs show a leukocytosis of 11.2,  creatinine is 2.03, hemoglobin 8.5. Urinalysis shows evidence of UTI, started on empiric antibiotics vancomycin and Zosyn. Non-con CT abdomen shows a 5 cm retroperitoneal mass increasing in size from 2.6 cm one month ago. Patient has persistent moderate right hydronephrosis. Marked prostatic enlargement suggesting a degree of bladder outlet obstruction. No other new bone lesions. Foley catheter placed in the ED. Urine culture obtained. Discussed with urology, Dr. Diona Fanti to see in the ED. Recommends medical admission. Discussed with internal medicine, Dr. Doyle Askew to see in the ED. Patient admitted to telemetry bed. Prior to admission, I discussed and reviewed this case with my attending, Dr. Alvino Chapel who also saw and evaluated the patient and agrees with plan for medical admission. Final diagnoses:  UTI (lower urinary tract infection)  Fever, unspecified fever cause  Retroperitoneal mass       Comer Locket, PA-C 03/20/15 2355  Davonna Belling, MD 03/21/15 [phone]

## 2015-03-20 NOTE — Telephone Encounter (Signed)
TC from pt's wife stating that pt has a fever of 102.9 as of 3pm. It was 100 @ 2pm.  BP 183/99 in right arm, 127/69 in left arm. He is on active treatment with Velcade (due tomorrow).She states that he is having chills and is very fatigued. Denies problems voiding, but has a slight cough. Deniies nausea or vomiting or diarrhea. C/o headache. Spoke with Dr. [name]. If we cannot see him [facility] d/t lateness of the time, then pt will need to go to ED. No availablility in [facility]. Call made to wife and advised her to take him to ED. She states that pt is reluctant to go to ED.  Advised wife that with such elevated fever and elevated BP, that he needs to go.  She stated that she would take him now.

## 2015-03-20 NOTE — Consult Note (Signed)
Urology Consult  Consulting MD:Dr. Alvino Chapel  CC: Bilateral hydronephrosis, fever  HPI: This is a 69 year old male who presented to the emergency room tonight with recent fever and malaise. The fever was 102 at home, and the wife states that the patient had shakes and chills with this. The patient is known to I practice from recent hospitalization with consultation for the patient's bladder outlet obstruction/significant BPH and bilateral hydronephrosis. This hydronephrosis seems to be associated with the patient's multiple myeloma and retroperitoneal masses which have enlarged. The patient is receiving therapy for his multiple myeloma including Velcade, Revlimid and dexamethasone.  The patient does have an indwelling Foley catheter. He apparently failed a voiding trial given in our office recently. He is on both finasteride and tamsulosin. He does have a very large median lobe. Retrograde ureteropyelogram attempt during his last hospitalization was fruitless secondary to the patient's huge median lobe and the inability to visualize his trigone because of this median lobe. The patient had his creatinine decreased from 5.11 during his last hospitalization to 2.01 the third of this month. At the time of admission, his creatinine is 2.03.  He is not complaining of any flank pain, despite his possible urinary tract infection and bilateral hydronephrosis. PMH: Past Medical History  Diagnosis Date  . Pulmonary nodule, right     W/  CHEST WALL MASS--  SCHEDULED FOR BIOPSY W/ DR [name] ON FRIDAY 10-27-2013  . History of seizures as a child     AGE 75 OR 8--- NONE ISSUE SINCE  . History of GI bleed     SECONDARY TO ULCER --  DEC 2011  RESOLVED PER LAST EGD 07-05-2010  . Bladder tumor   . Hematuria   . Radiation 12/07/13-12/27/13    right anterior chest 37.5 gray  . Fracture of radius 09/29/14    left  . Cancer ([place])     multiple myeloma, bladder cancer 2015    PSH: Past Surgical History   Procedure Laterality Date  . Excision benign left axilla tumor  1993  . Esophagogastroduodenoscopy  X5   LAST ONE 07-05-2010  . Transurethral resection of bladder tumor with gyrus (turbt-gyrus) N/A 10/25/2013    Procedure: TRANSURETHRAL RESECTION OF BLADDER TUMOR WITH GYRUS (TURBT-GYRUS);  Surgeon: Sharyn Creamer, MD;  Location: [facility];  Service: Urology;  Laterality: N/A;  . Cystoscopy with biopsy N/A 10/25/2013    Procedure: CYSTOSCOPY;  Surgeon: Sharyn Creamer, MD;  [facility];  Service: Urology;  Laterality: N/A;  . Bone biopsy Right 11/14/13     right seventh rib lesion  . Orif radial fracture Left 10/22/2014    Procedure: Intramedullary nailing left radius fracture;  Surgeon: Milly Jakob, MD;  Location: [facility];  Service: Orthopedics;  Laterality: Left;  OPEN TREATMENT LEFT RADIUS FRACTURE  . Cystoscopy with retrograde pyelogram, ureteroscopy and stent placement N/A 02/21/2015    Procedure: CYSTOSCOPY, EXAM UNDER ANESTHESIA, FOLEY CATHETER PLACEMENT;  Surgeon: Festus Aloe, MD;  Location: [facility];  Service: Urology;  Laterality: N/A;    Allergies: No Known Allergies  Medications:  (Not in a hospital admission)   Social History: Social History   Social History  . Marital Status: Married    Spouse Name: N/A  . Number of Children: 2  . Years of Education: N/A   Occupational History  . retired    Social History Main Topics  . Smoking status: Former Smoker -- 0.10 packs/day for 1 years  Types: Cigarettes    Quit date: 05/04/1969  . Smokeless tobacco: Never Used  . Alcohol Use: No  . Drug Use: No  . Sexual Activity: Not on file   Other Topics Concern  . Not on file   Social History Narrative    Family History: Family History  Problem Relation Age of Onset  . Heart disease Mother   . Cancer - Prostate Brother     Review of Systems: Positive: Fever, chills, mild weakness Negative:   A  further 10 point review of systems was negative except what is listed in the HPI.  Physical Exam: _0 @ General: No acute distress.  Awake. He does not appear toxic. Head:  Normocephalic.  Atraumatic. ENT:  EOMI.  Mucous membranes moist Neck:  Supple.  No lymphadenopathy. Pulmonary: Equal effort bilaterally.   Abdomen: Protuberant, Soft.  Non- tender to palpation. There is no CVA tenderness. Skin:  Normal turgor.  No visible rash. Extremity: No gross deformity of bilateral upper extremities.  No gross deformity of bilateral lower extremities. Neurologic: Alert. Appropriate mood.  Penis:   No lesions. Urethra: Foley catheter in place.  Orthotopic meatus.   Studies:  Recent Labs     03/20/15  1640  HGB  8.5*  WBC  11.2*  PLT  201    Recent Labs     03/20/15  1640  NA  135  K  4.3  CL  106  CO2  20*  BUN  30*  CREATININE  2.03*  CALCIUM  9.0  GFRNONAA  32*  GFRAA  37*     Recent Labs     03/20/15  1640  INR  1.16  APTT  37     Invalid input(s): ABG  CT images were reviewed.  Assessment:  1. 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 This is currently managed with indwelling Foley catheter. This seems to be draining well and currently the urine looks fairly clear  2. Bilateral hydronephrosis secondary to retroperitoneal masses. The right hydronephrosis appears the same, there is minimal left hydronephrosis. The left retroperitoneal mass has enlarged somewhat.  3. Fever. This is nonspecific, but I don't really suspect pyelonephritis at this time, is a patient does not have any significant CVA tenderness. However, if pyelonephritis is suspect, this would need to be treated with appropriate drainage i.e. her cutaneous nephrostomy tubes  Plan: I think it's worthwhile at this point to panculture the patient, treat with appropriate IV antibiotics. As above, if pyelonephritis is strongly suspected (I don't think it's a reality at this time)  percutaneous drainage would be necessary, as the patient has a huge prostate that precluded retrograde ureteropyelogram during prior hospitalization. They could be performed by interventional radiology. However, at this time, I would recommend watchful waiting, as his hydronephrosis does not look any worse and he does not have any perinephric stranding.  We will continue to follow him during this hospitalization.    [phone]

## 2015-03-20 NOTE — ED Notes (Signed)
Pt can go at 18:58

## 2015-03-20 NOTE — H&P (Addendum)
[facility] History and Physical  Wesley [name] WUX:[id number] DOB: [birth year] DOA: 03/20/2015  Referring physician: ED physician, Dr. Eulas Post  PCP: [name], MD   Chief Complaint: fevers   HPI:  Pt is 69 yo male with known history of multiple myeloma (follows with Dr [name]), radiation to the tumor mass at the right anterior chest under the care of Dr. [name] completed 12/07/2013 through 12/27/2013, was on systemic chemotherapy with Velcade, Revlimid and Decadron, seen in cancer center 01/31/2015 at which time decision made to hold off on tx due to worsening renal failure, recently discharged on 10/22 after being hospitalized for renal failure and bladder outlet obstruction/bilateral hydronephrosis. He now presents to [facility] ED with main concern of several days duration of fevers up to 102 F with malaise and poor oral intake.   In ED, pt is hemodynamically stable with stable VS, blood work notable for WBC 11.2, Hg 8.5, creatinine decreased from 5.11 during his last hospitalization to 2.03. Pt started on vanc and zosyn in ED, urology consulted and [facility] asked to admit for further evaluation.   Assessment and Plan: Principal Problem:   Sepsis ([place]) - pt met criteria for sepsis on admission with HR > 90, RR > 22, T 102.4, source UTI - agree with broad spectrum abx due to immunocompromised state - follow up on blood and urine cultures, narrow abx as clinically indicated  Active Problems:   Kappa light chain myeloma (HCC) - with new lytic lesions identified on the right side of the ribs - new pulmonary nodule also noted - will notify oncologist about pt's admission     Bilateral hydronephrosis - follow up on urologist recommendations     General weakness - secondary to progressive MM - PT evaluation once pt medically improved     UTI (lower urinary tract infection) - ABX as noted above - follow up on urine cultures     CKD (chronic kidney disease) stage 5, GFR less  than 15 ml/min (HCC) - Cr at baseline ~2 - BMP in AM    Antineoplastic chemotherapy induced anemia - no signs of bleeding - CBC in AM    Essential hypertension - reasonably stable on admission    On coumadin for DVT prophylaxis    Radiological Exams on Admission: Ct Abdomen Pelvis Wo Contrast 03/20/2015  New right lower lobe pulmonary nodule. 2. 5 cm left retroperitoneal mass at the level of the left renal vein, previously 2.6 cm. 3. Persistent moderate right hydronephrosis and ureterectasis. 4. Marked prostatic enlargement protruding into the lumen of the urinary bladder, with wall thickening suggesting a degree of bladder outlet obstruction. 5. Stable right seventh rib lytic/sclerotic lesion. No new bone lesions identified.   Dg Chest 2 View 03/20/2015 Resorption of the right lateral seventh rib and associated right lateral chest wall mass have increased significantly in size, now measuring 4.5 x 1.8 cm. Would correlate with prior biopsy results. 2. Lungs hypoexpanded, with elevation of the right hemidiaphragm. Borderline cardiomegaly.  Code Status: Full Family Communication: Pt at bedside Disposition Plan: Admit for further evaluation    [facility] [phone]   Review of Systems:  Constitutional: Negative for diaphoresis.  HENT: Negative for hearing loss, ear pain, nosebleeds, congestion, sore throat, neck pain, tinnitus and ear discharge.   Eyes: Negative for blurred vision, double vision, photophobia, pain, discharge and redness.  Respiratory: Negative for cough, hemoptysis, sputum production, shortness of breath, wheezing and stridor.   Cardiovascular: Negative for chest pain, palpitations, orthopnea, claudication and  leg swelling.  Gastrointestinal: Negative for heartburn, constipation, blood in stool and melena.  Genitourinary: Negative for dysuria and flank pain.  Musculoskeletal: Negative for myalgias, back pain, joint pain and falls.  Skin: Negative for itching  and rash.  Neurological: Negative for dizziness and weakness.  Endo/Heme/Allergies: Negative for environmental allergies and polydipsia. Does not bruise/bleed easily.  Psychiatric/Behavioral: Negative for suicidal ideas. The patient is not nervous/anxious.      Past Medical History  Diagnosis Date  . Pulmonary nodule, right     W/  CHEST WALL MASS--  SCHEDULED FOR BIOPSY W/ DR [name] ON FRIDAY 10-27-2013  . History of seizures as a child     AGE 18 OR 8--- NONE ISSUE SINCE  . History of GI bleed     SECONDARY TO ULCER --  DEC 2011  RESOLVED PER LAST EGD 07-05-2010  . Bladder tumor   . Hematuria   . Radiation 12/07/13-12/27/13    right anterior chest 37.5 gray  . Fracture of radius 09/29/14    left  . Cancer [facility])     multiple myeloma, bladder cancer 2015    Past Surgical History  Procedure Laterality Date  . Excision benign left axilla tumor  1993  . Esophagogastroduodenoscopy  X5   LAST ONE 07-05-2010  . Transurethral resection of bladder tumor with gyrus (turbt-gyrus) N/A 10/25/2013    Procedure: TRANSURETHRAL RESECTION OF BLADDER TUMOR WITH GYRUS (TURBT-GYRUS);  Surgeon: Sharyn Creamer, MD;  Location: [facility];  Service: Urology;  Laterality: N/A;  . Cystoscopy with biopsy N/A 10/25/2013    Procedure: CYSTOSCOPY;  Surgeon: Sharyn Creamer, MD;  Location: [facility];  Service: Urology;  Laterality: N/A;  . Bone biopsy Right 11/14/13     right seventh rib lesion  . Orif radial fracture Left 10/22/2014    Procedure: Intramedullary nailing left radius fracture;  Surgeon: Milly Jakob, MD;  Location: [place];  Service: Orthopedics;  Laterality: Left;  OPEN TREATMENT LEFT RADIUS FRACTURE  . Cystoscopy with retrograde pyelogram, ureteroscopy and stent placement N/A 02/21/2015    Procedure: CYSTOSCOPY, EXAM UNDER ANESTHESIA, FOLEY CATHETER PLACEMENT;  Surgeon: Festus Aloe, MD;  Location: [facility];  Service: Urology;  Laterality:  N/A;    Social History:  reports that he quit smoking about 45 years ago. His smoking use included Cigarettes. He has a .1 pack-year smoking history. He has never used smokeless tobacco. He reports that he does not drink alcohol or use illicit drugs.  No Known Allergies  Family History  Problem Relation Age of Onset  . Heart disease Mother   . Cancer - Prostate Brother     Prior to Admission medications   Medication Sig Start Date End Date Taking? Authorizing Provider  Bortezomib (VELCADE IJ) Inject as directed. Infusion at [facility]   Yes Historical Provider, MD  finasteride (PROSCAR) 5 MG tablet Take 1 tablet (5 mg total) by mouth daily. 02/23/15  Yes Theodis Blaze, MD  hydrALAZINE (APRESOLINE) 50 MG tablet Take 1 tablet (50 mg total) by mouth every 8 (eight) hours. 02/23/15  Yes Theodis Blaze, MD  HYDROcodone-acetaminophen (NORCO/VICODIN) 5-325 MG tablet Take 1-2 tablets by mouth every 4 (four) hours as needed for moderate pain. 02/23/15  Yes Theodis Blaze, MD  tamsulosin (FLOMAX) 0.4 MG CAPS capsule Take 1 capsule (0.4 mg total) by mouth daily. 02/23/15  Yes Theodis Blaze, MD  warfarin (COUMADIN) 2 MG tablet Take 1 tablet (2 mg total) by mouth  daily. 01/31/15  Yes [name], MD    Physical Exam: Filed Vitals:   03/20/15 1658 03/20/15 1718 03/20/15 1802 03/20/15 1848  BP:  139/88 145/86 126/77  Pulse:  110 105 86  Temp:  100.3 F (37.9 C)  99.4 F (37.4 C)  TempSrc:  Oral  Oral  Resp:  _0 Weight: 88.451 kg (195 lb)     SpO2:  94% 97% 96%    Physical Exam  Constitutional: Appears well-developed and well-nourished. No distress.  HENT: Normocephalic. External right and left ear normal. Oropharynx is clear and moist.  Eyes: Conjunctivae and EOM are normal. PERRLA, no scleral icterus.  Neck: Normal ROM. Neck supple. No JVD. No tracheal deviation. No thyromegaly.  CVS: RRR, S1/S2 +, no murmurs, no gallops, no carotid bruit.  Pulmonary: Effort and breath sounds normal,  no stridor, rhonchi, wheezes, rales.  Abdominal: Soft. BS +,  no distension, tenderness, rebound or guarding.  Musculoskeletal: Normal range of motion. No edema and no tenderness.  Lymphadenopathy: No lymphadenopathy noted, cervical, inguinal. Neuro: Alert. Normal reflexes, muscle tone coordination. No cranial nerve deficit. Skin: Skin is warm and dry. No rash noted. Not diaphoretic. No erythema. No pallor.  Psychiatric: Normal mood and affect. Behavior, judgment, thought content normal.   Labs on Admission:  Basic Metabolic Panel:  Recent Labs Lab 03/14/15 1351 03/20/15 1640  NA 138 135  K 4.6 4.3  CL  --  106  CO2 18* 20*  GLUCOSE 104 121*  BUN 24.0 30*  CREATININE 2.0* 2.03*  CALCIUM 9.4 9.0   Liver Function Tests:  Recent Labs Lab 03/14/15 1351 03/20/15 1640  AST 11 32  ALT 12 26  ALKPHOS 72 65  BILITOT 0.65 1.0  PROT 7.2 7.4  ALBUMIN 3.5 3.8   CBC:  Recent Labs Lab 03/14/15 1350 03/20/15 1640  WBC 4.6 11.2*  NEUTROABS 2.4 8.9*  HGB 8.6* 8.5*  HCT 25.6* 25.1*  MCV 89.8 91.3  PLT 157 201   EKG: pending   If 7PM-7AM, please contact night-coverage [email or web address] Password [facility] 03/20/2015, 7:19 PM

## 2015-03-20 NOTE — Progress Notes (Addendum)
3ANTIBIOTIC CONSULT NOTE - INITIAL  Pharmacy Consult for Vancomycin / Zosyn Indication: Sepsis secondary to possible UTI  No Known Allergies  Patient Measurements:   Adjusted Body Weight:   Vital Signs: Temp: 102.4 F (39.1 C) (11/16 1627) Temp Source: Oral (11/16 1627) BP: 152/94 mmHg (11/16 1627) Pulse Rate: 118 (11/16 1650) Intake/Output from previous day:   Intake/Output from this shift:    Labs: No results for input(s): WBC, HGB, PLT, LABCREA, CREATININE in the last 72 hours. Estimated Creatinine Clearance: 38.7 mL/min (by C-G formula based on Cr of 2). No results for input(s): VANCOTROUGH, VANCOPEAK, VANCORANDOM, GENTTROUGH, GENTPEAK, GENTRANDOM, TOBRATROUGH, TOBRAPEAK, TOBRARND, AMIKACINPEAK, AMIKACINTROU, AMIKACIN in the last 72 hours.   Microbiology: Recent Results (from the past 720 hour(s))  Surgical pcr screen     Status: Abnormal   Collection Time: 02/21/15  1:49 PM  Result Value Ref Range Status   MRSA, PCR NEGATIVE NEGATIVE Final   Staphylococcus aureus POSITIVE (A) NEGATIVE Final    Comment:        The Xpert SA Assay (FDA approved for NASAL specimens in patients over 59 years of age), is one component of a comprehensive surveillance program.  Test performance has been validated by [facility] for patients greater than or equal to 40 year old. It is not intended to diagnose infection nor to guide or monitor treatment.     Medical History: Past Medical History  Diagnosis Date  . Pulmonary nodule, right     W/  CHEST WALL MASS--  SCHEDULED FOR BIOPSY W/ DR [name] ON FRIDAY 10-27-2013  . History of seizures as a child     AGE 79 OR 8--- NONE ISSUE SINCE  . History of GI bleed     SECONDARY TO ULCER --  DEC 2011  RESOLVED PER LAST EGD 07-05-2010  . Bladder tumor   . Hematuria   . Radiation 12/07/13-12/27/13    right anterior chest 37.5 gray  . Fracture of radius 09/29/14    left  . Cancer [facility])     multiple myeloma, bladder cancer 2015    Assessment: 6 yoM with hx multiple myeloma on chemotherapy and warfarin for VTE px, last received velcade on 03/14/15 , recent hospitalization for HTN, ARF 2/2 bilateral hydronephrosis, and weakness presents to [facility] on 11/16 with fever and HTN.   Pharmacy consulted to start broad spectrum antibiotics for sepsis secondary to possible UTI.   Anti-infectives 11/16 >> Vancomycin  >> 11/16 >> Zosyn >>    Vitals/Labs WBC: 11.2 (ANC 8.9) Tm24h: 102.4 Renal: SCr 2.03, CrCl ~38 CG (N36) LA WNL  Cultures 11/16 bloodx2: IP  Goal of Therapy:  Vancomycin trough level 15-20 mcg/ml  Eradication of infection  Plan:  Vancomycin 2049m IV x1, then 12562mIV q24h Zosyn 3.375g IV x1 over 30 min, then Zosyn 3.375g IV q8h (infuse over 4 hours) Follow renal function, cultures, drug levels as warranted, clinical course  CoRalene BathePharmD, BCPS 03/20/2015, 5:58 PM  Pager: [phone]

## 2015-03-20 NOTE — Progress Notes (Signed)
Patient noted to have been admitted and discharged from hospital from 10/16-10/22 with bilateral hydronephrosis and accelerated hypertension.  EDCM spoke to patient and his wife at bedside.  Patient's wife reports patient has been seen by his oncologist and urologist since discharge, not his pcp.  Patient's wife confirms patient's pcp is Dr. Burnice Logan.  Patient's wife reports patient did receive a discharge follow up phone call at home.  Patient's wife reports the patient is able to perform his ADL's without difficulty.  Patient and patient's wife deny the need for home health needs and dme at this time.  No further EDCM needs at this time.

## 2015-03-20 NOTE — Progress Notes (Signed)
ANTICOAGULATION CONSULT NOTE - Initial Consult  Pharmacy Consult for Warfarin  Indication: VTE prophylaxis  No Known Allergies  Patient Measurements: Weight: 198 lb [name].4 oz (89.994 kg)  Vital Signs: Temp: 99.4 F (37.4 C) (11/16 1929) Temp Source: Oral (11/16 1929) BP: 147/83 mmHg (11/16 1929) Pulse Rate: 99 (11/16 1929)  Labs:  Recent Labs  03/20/15 1640  HGB 8.5*  HCT 25.1*  PLT 201  APTT 37  LABPROT 15.0  INR 1.16  CREATININE 2.03*    Estimated Creatinine Clearance: 38.5 mL/min (by C-G formula based on Cr of 2.03).   Medical History: Past Medical History  Diagnosis Date  . Pulmonary nodule, right     W/  CHEST WALL MASS--  SCHEDULED FOR BIOPSY W/ DR [name] ON FRIDAY 10-27-2013  . History of seizures as a child     AGE Wesley [name]--- NONE ISSUE SINCE  . History of GI bleed     SECONDARY TO ULCER --  DEC 2011  RESOLVED PER LAST EGD 07-05-2010  . Bladder tumor   . Hematuria   . Radiation 8/[name]/15-8/26/15    right anterior chest 37.5 gray  . Fracture of radius 09/29/14    left  . Cancer [facility])     multiple myeloma, bladder cancer 2015    Medications:  Prescriptions prior to admission  Medication Sig Dispense Refill Last Dose  . Bortezomib (VELCADE IJ) Inject as directed. Infusion at [facility]   03/14/2015  . finasteride (PROSCAR) 5 MG tablet Take 1 tablet (5 mg total) by mouth daily. 30 tablet 1 03/20/2015 at Unknown time  . hydrALAZINE (APRESOLINE) 50 MG tablet Take 1 tablet (50 mg total) by mouth every 8 (eight) hours. Wesley tablet 1 03/20/2015 at 0700  . HYDROcodone-acetaminophen (NORCO/VICODIN) 5-325 MG tablet Take 1-2 tablets by mouth every 4 (four) hours as needed for moderate pain. 65 tablet 0 unknown  . tamsulosin (FLOMAX) 0.4 MG CAPS capsule Take 1 capsule (0.4 mg total) by mouth daily. 30 capsule 1 03/20/2015 at Unknown time  . warfarin (COUMADIN) 2 MG tablet Take 1 tablet (2 mg total) by mouth daily. 30 tablet 1 03/20/2015 at 0700    Assessment: 30 yoM  admitted on 11/16 with fever, malaise, and poor PO intake.  PMH is significant for multiple myeloma currently receiving chemotherapy, and low-dose warfarin for VTE prophylaxis while on Revlimid.  Although revlimid has been held, on last admission (on 02/18/15) Dr. [name] wished to continue warfarin at 48m daily (no INR monitoring or goal).  Pharmacy is consulted to dose warfarin this admission for VTE prophylaxis.  Today, 03/20/2015: INR on admission = 1.16, subtherapeutic as expected. CBC: Hgb 8.5 (baseline appears to be 8-9s), Plt 201 No bleeding reported or documented Drug-drug interactions: broad spectrum antibiotics may prolong INR Diet: heart healthy  Goal of Therapy:  VTE prophylaxis Monitor platelets by anticoagulation protocol: Yes   Plan:   Continue Warfarin 233mPO daily.  Check PT/INR q72h  Pharmacy will sign off at this time.  Please reconsult if a change in clinical status warrants re-evaluation of dosage.  ChGretta ArabharmD, BCPS Pager [phone]/16/2016 7:41 PM

## 2015-03-20 NOTE — ED Notes (Signed)
Per patient/wife-fever today, last temp was 102-states hypertensive as well-patient states he has been experiencing some chills

## 2015-03-21 ENCOUNTER — Other Ambulatory Visit: Payer: Medicare HMO

## 2015-03-21 ENCOUNTER — Ambulatory Visit: Payer: Medicare HMO

## 2015-03-21 ENCOUNTER — Ambulatory Visit: Payer: Medicare HMO | Admitting: Nurse Practitioner

## 2015-03-21 DIAGNOSIS — Z79899 Other long term (current) drug therapy: Secondary | ICD-10-CM | POA: Diagnosis not present

## 2015-03-21 DIAGNOSIS — D638 Anemia in other chronic diseases classified elsewhere: Secondary | ICD-10-CM | POA: Diagnosis not present

## 2015-03-21 DIAGNOSIS — N320 Bladder-neck obstruction: Secondary | ICD-10-CM | POA: Diagnosis present

## 2015-03-21 DIAGNOSIS — B952 Enterococcus as the cause of diseases classified elsewhere: Secondary | ICD-10-CM | POA: Diagnosis present

## 2015-03-21 DIAGNOSIS — Z7901 Long term (current) use of anticoagulants: Secondary | ICD-10-CM | POA: Diagnosis not present

## 2015-03-21 DIAGNOSIS — N1330 Unspecified hydronephrosis: Secondary | ICD-10-CM | POA: Diagnosis present

## 2015-03-21 DIAGNOSIS — N401 Enlarged prostate with lower urinary tract symptoms: Secondary | ICD-10-CM | POA: Diagnosis present

## 2015-03-21 DIAGNOSIS — D899 Disorder involving the immune mechanism, unspecified: Secondary | ICD-10-CM | POA: Diagnosis present

## 2015-03-21 DIAGNOSIS — Z8042 Family history of malignant neoplasm of prostate: Secondary | ICD-10-CM | POA: Diagnosis not present

## 2015-03-21 DIAGNOSIS — T451X5A Adverse effect of antineoplastic and immunosuppressive drugs, initial encounter: Secondary | ICD-10-CM | POA: Diagnosis present

## 2015-03-21 DIAGNOSIS — Z79891 Long term (current) use of opiate analgesic: Secondary | ICD-10-CM | POA: Diagnosis not present

## 2015-03-21 DIAGNOSIS — C9000 Multiple myeloma not having achieved remission: Secondary | ICD-10-CM | POA: Diagnosis present

## 2015-03-21 DIAGNOSIS — R911 Solitary pulmonary nodule: Secondary | ICD-10-CM | POA: Diagnosis present

## 2015-03-21 DIAGNOSIS — A419 Sepsis, unspecified organism: Secondary | ICD-10-CM | POA: Diagnosis not present

## 2015-03-21 DIAGNOSIS — I10 Essential (primary) hypertension: Secondary | ICD-10-CM | POA: Diagnosis not present

## 2015-03-21 DIAGNOSIS — Z923 Personal history of irradiation: Secondary | ICD-10-CM | POA: Diagnosis not present

## 2015-03-21 DIAGNOSIS — R222 Localized swelling, mass and lump, trunk: Secondary | ICD-10-CM | POA: Diagnosis present

## 2015-03-21 DIAGNOSIS — Z87891 Personal history of nicotine dependence: Secondary | ICD-10-CM | POA: Diagnosis not present

## 2015-03-21 DIAGNOSIS — A4152 Sepsis due to Pseudomonas: Secondary | ICD-10-CM | POA: Diagnosis present

## 2015-03-21 DIAGNOSIS — N390 Urinary tract infection, site not specified: Secondary | ICD-10-CM | POA: Diagnosis present

## 2015-03-21 DIAGNOSIS — N138 Other obstructive and reflux uropathy: Secondary | ICD-10-CM | POA: Diagnosis present

## 2015-03-21 DIAGNOSIS — D6481 Anemia due to antineoplastic chemotherapy: Secondary | ICD-10-CM | POA: Diagnosis present

## 2015-03-21 DIAGNOSIS — Z8249 Family history of ischemic heart disease and other diseases of the circulatory system: Secondary | ICD-10-CM | POA: Diagnosis not present

## 2015-03-21 DIAGNOSIS — Z8551 Personal history of malignant neoplasm of bladder: Secondary | ICD-10-CM | POA: Diagnosis not present

## 2015-03-21 DIAGNOSIS — Z9221 Personal history of antineoplastic chemotherapy: Secondary | ICD-10-CM | POA: Diagnosis not present

## 2015-03-21 DIAGNOSIS — I120 Hypertensive chronic kidney disease with stage 5 chronic kidney disease or end stage renal disease: Secondary | ICD-10-CM | POA: Diagnosis present

## 2015-03-21 DIAGNOSIS — N185 Chronic kidney disease, stage 5: Secondary | ICD-10-CM | POA: Diagnosis present

## 2015-03-21 DIAGNOSIS — R509 Fever, unspecified: Secondary | ICD-10-CM | POA: Diagnosis present

## 2015-03-21 DIAGNOSIS — R531 Weakness: Secondary | ICD-10-CM | POA: Diagnosis present

## 2015-03-21 LAB — CBC
HEMATOCRIT: 25.2 % — AB (ref 39.0–52.0)
HEMOGLOBIN: 8.3 g/dL — AB (ref 13.0–17.0)
MCH: 30.6 pg (ref 26.0–34.0)
MCHC: 32.9 g/dL (ref 30.0–36.0)
MCV: 93 fL (ref 78.0–100.0)
Platelets: 197 10*3/uL (ref 150–400)
RBC: 2.71 MIL/uL — AB (ref 4.22–5.81)
RDW: 17.5 % — AB (ref 11.5–15.5)
WBC: 9.6 10*3/uL (ref 4.0–10.5)

## 2015-03-21 LAB — BASIC METABOLIC PANEL
ANION GAP: 7 (ref 5–15)
BUN: 27 mg/dL — ABNORMAL HIGH (ref 6–20)
CHLORIDE: 111 mmol/L (ref 101–111)
CO2: 20 mmol/L — ABNORMAL LOW (ref 22–32)
Calcium: 8.8 mg/dL — ABNORMAL LOW (ref 8.9–10.3)
Creatinine, Ser: 2.13 mg/dL — ABNORMAL HIGH (ref 0.61–1.24)
GFR, EST AFRICAN AMERICAN: 35 mL/min — AB (ref >60)
GFR, EST NON AFRICAN AMERICAN: 30 mL/min — AB (ref >60)
Glucose, Bld: 119 mg/dL — ABNORMAL HIGH (ref 65–99)
POTASSIUM: 4.7 mmol/L (ref 3.5–5.1)
SODIUM: 138 mmol/L (ref 135–145)

## 2015-03-21 MED ORDER — ACETAMINOPHEN 325 MG PO TABS
650.0000 mg | ORAL_TABLET | ORAL | Status: DC | PRN
  Administered 2015-03-21 (×2): 650 mg via ORAL
  Filled 2015-03-21 (×2): qty 2

## 2015-03-21 MED ORDER — SODIUM CHLORIDE 0.9 % IV BOLUS (SEPSIS)
500.0000 mL | Freq: Once | INTRAVENOUS | Status: AC
  Administered 2015-03-21: 500 mL via INTRAVENOUS

## 2015-03-21 MED ORDER — SODIUM CHLORIDE 0.9 % IV SOLN
1250.0000 mg | INTRAVENOUS | Status: DC
  Administered 2015-03-21 – 2015-03-24 (×4): 1250 mg via INTRAVENOUS
  Filled 2015-03-21 (×4): qty 1250

## 2015-03-21 NOTE — Progress Notes (Signed)
Received report from lab for positive blood cultures as noted:  GRAM POSITIVE COCCI IN CLUSTERS  AEROBIC BOTTLE ONLY  CRITICAL RESULT CALLED TO, READ BACK BY AND VERIFIED WITH: Franne Grip RN 2009 03/21/15 A BROWNING    SRP, RN l

## 2015-03-21 NOTE — Progress Notes (Signed)
Patient ID: Wesley [name], male   DOB: 08-Apr-1946, 69 y.o.   MRN: [id number]  [facility] PROGRESS NOTE  Jamaury Gumz CWC:[id number] DOB: [birth year] DOA: 03/20/2015 PCP: [name], MD   Brief narrative:    Pt is 69 yo male with known history of multiple myeloma (follows with Dr [name]), radiation to the tumor mass at the right anterior chest under the care of Dr. [name] completed 12/07/2013 through 12/27/2013, was on systemic chemotherapy with Velcade, Revlimid and Decadron, seen in cancer center 01/31/2015 at which time decision made to hold off on tx due to worsening renal failure, recently discharged on 10/22 after being hospitalized for renal failure and bladder outlet obstruction/bilateral hydronephrosis. He now presents to [facility] ED with main concern of several days duration of fevers up to 102 F with malaise and poor oral intake.   In ED, pt is hemodynamically stable with stable VS, blood work notable for WBC 11.2, Hg 8.5, creatinine decreased from 5.11 during his last hospitalization to 2.03. Pt started on vanc and zosyn in ED, urology consulted and [facility] asked to admit for further evaluation.   Assessment/Plan:    Principal Problem:  Sepsis ([place]) - pt met criteria for sepsis on admission with HR > 90, RR > 22, T 102.4, source UTI - continue zosyn day #2, stop vanc as Cr is up this AM  - follow up on blood and urine cultures, narrow abx as clinically indicated  - fever curve trending down  Active Problems:  Kappa light chain myeloma (HCC) - with new lytic lesions identified on the right side of the ribs - new pulmonary nodule also noted - notified oncologist about pt's admission    Bilateral hydronephrosis - follow up on urologist recommendations  - currently no indications for an interventions    General weakness - secondary to progressive MM - PT evaluation requested    UTI (lower urinary tract infection) - ABX as noted above - follow up on urine  cultures    CKD (chronic kidney disease) stage 5, GFR less than 15 ml/min (HCC) - Cr at baseline ~2 - BMP in AM   Antineoplastic chemotherapy induced anemia - no signs of bleeding - CBC in AM   Essential hypertension - reasonably stable on admission   On coumadin for DVT prophylaxis   Radiological Exams on Admission: Ct Abdomen Pelvis Wo Contrast 03/20/2015 New right lower lobe pulmonary nodule. 2. 5 cm left retroperitoneal mass at the level of the left renal vein, previously 2.6 cm. 3. Persistent moderate right hydronephrosis and ureterectasis. 4. Marked prostatic enlargement protruding into the lumen of the urinary bladder, with wall thickening suggesting a degree of bladder outlet obstruction. 5. Stable right seventh rib lytic/sclerotic lesion. No new bone lesions identified.   Dg Chest 2 View 03/20/2015 Resorption of the right lateral seventh rib and associated right lateral chest wall mass have increased significantly in size, now measuring 4.5 x 1.8 cm. Would correlate with prior biopsy results. 2. Lungs hypoexpanded, with elevation of the right hemidiaphragm. Borderline cardiomegaly.  DVT prophylaxis  Code Status: Full.  Family Communication:  plan of care discussed with the patient Disposition Plan: Home when stable, possibly in 24 - 48 hours when sepsis etiology resolves    IV access:  Peripheral IV  Procedures and diagnostic studies:    Ct Abdomen Pelvis Wo Contrast  03/20/2015  CLINICAL DATA:  Fever, h/o bilat hydronephrosis, bladder tumor diagnosed 2015 EXAM: CT ABDOMEN AND PELVIS WITHOUT CONTRAST TECHNIQUE: Multidetector CT imaging of  the abdomen and pelvis was performed following the standard protocol without IV contrast. COMPARISON:  02/17/2015 FINDINGS: 1 cm subpleural nodule in the posterolateral left lower lobe, stable. New 6 mm subpleural nodule in the posterolateral right lower lobe image 2/6, and and several proximal less sharply marginated nodular  densities in the posterior basal segment right lower lobe. Mixed lytic/sclerotic lesion in the lateral aspect right seventh rib as before. Unremarkable liver, nondilated gallbladder, spleen, adrenal glands, pancreas. Unenhanced CT was performed per clinician order. Lack of IV contrast limits sensitivity and specificity, especially for evaluation of abdominal/pelvic solid viscera. Moderate right hydronephrosis and ureterectasis down to the ureteral orifice as before. The left hydronephrosis has improved. Left renal parenchymal atrophy. 5 cm mass medial to the left kidney abutting left renal vein, previously 2.6 cm. Stomach is decompressed. Small bowel nondilated. Normal appendix. Moderate proximal colonic fecal material, decompressed distally. Distended urinary bladder with diffuse wall thickening. The mastoid there is massive prostatic enlargement, protruding into the lumen of the urinary bladder in a lobular fashion. Bilateral pelvic phleboliths. Patchy iliac arterial calcifications. No ascites. No free air. No adenopathy localized. Facet DJD in the lower lumbar spine. IMPRESSION: 1. New right lower lobe pulmonary nodule. 2. 5 cm left retroperitoneal mass at the level of the left renal vein, previously 2.6 cm. 3. Persistent moderate right hydronephrosis and ureterectasis. 4. Marked prostatic enlargement protruding into the lumen of the urinary bladder, with wall thickening suggesting a degree of bladder outlet obstruction. 5. Stable right seventh rib lytic/sclerotic lesion. No new bone lesions identified. Electronically Signed   By: Lucrezia Europe M.D.   On: 03/20/2015 17:17   Dg Chest 2 View  03/20/2015  CLINICAL DATA:  Acute onset of fever. Current history of chest wall mass, status post biopsy. Initial encounter. EXAM: CHEST  2 VIEW COMPARISON:  Chest radiograph performed 02/17/2015 FINDINGS: Resorption of the right lateral seventh rib and associated right lateral chest wall mass have increased significantly in  size, now measuring 4.5 x 1.8 cm. Would correlate with prior biopsy results. The lungs are hypoexpanded, with elevation of the right hemidiaphragm. No pleural effusion or pneumothorax is seen. The heart is borderline enlarged. No acute osseous abnormalities are identified. Scattered clips are noted overlying the left axilla. IMPRESSION: 1. Resorption of the right lateral seventh rib and associated right lateral chest wall mass have increased significantly in size, now measuring 4.5 x 1.8 cm. Would correlate with prior biopsy results. 2. Lungs hypoexpanded, with elevation of the right hemidiaphragm. Borderline cardiomegaly. Electronically Signed   By: Garald Balding M.D.   On: 03/20/2015 18:47    Medical Consultants:  Urology   Other Consultants:  PT  IAnti-Infectives:   Vanc and Zosyn 11/17 -->  Faye Ramsay, MD  [facility] Pager [phone]  If 7PM-7AM, please contact night-coverage [email or web address] Password [email or web address] 03/21/2015, 12:23 PM   LOS: 1 day   HPI/Subjective: No events overnight.   Objective: Filed Vitals:   03/20/15 2202 03/21/15 0030 03/21/15 0544 03/21/15 0738  BP: 151/92 149/94 146/89 137/74  Pulse:  122 106 108  Temp: 98.8 F (37.1 C) 102.9 F (39.4 C) 100.6 F (38.1 C) 99.1 F (37.3 C)  TempSrc: Oral Oral Oral Oral  Resp:  $Remo'18 16 16  'kMJxK$ Height:      Weight:   89.7 kg (197 lb 12 oz)   SpO2:  98% 99% 98%    Intake/Output Summary (Last 24 hours) at 03/21/15 1223 Last data filed at 03/21/15 1211  Gross per 24 hour  Intake    290 ml  Output   4800 ml  Net  -4510 ml    Exam:   General:  Pt is alert, follows commands appropriately, not in acute distress  Cardiovascular: Regular rhythm, tachycardic, S1/S2, no murmurs, no rubs, no gallops  Respiratory: Clear to auscultation bilaterally, no wheezing, no crackles, no rhonchi  Abdomen: Soft, non tender, slightly distended, bowel sounds present, no guarding   Data Reviewed: Basic Metabolic Panel:  Recent  Labs Lab 03/14/15 1351 03/20/15 1640 03/21/15 0427  NA 138 135 138  K 4.6 4.3 4.7  CL  --  106 111  CO2 18* 20* 20*  GLUCOSE 104 121* 119*  BUN 24.0 30* 27*  CREATININE 2.0* 2.03* 2.13*  CALCIUM 9.4 9.0 8.8*   Liver Function Tests:  Recent Labs Lab 03/14/15 1351 03/20/15 1640  AST 11 32  ALT 12 26  ALKPHOS 72 65  BILITOT 0.65 1.0  PROT 7.2 7.4  ALBUMIN 3.5 3.8   CBC:  Recent Labs Lab 03/14/15 1350 03/20/15 1640 03/21/15 0427  WBC 4.6 11.2* 9.6  NEUTROABS 2.4 8.9*  --   HGB 8.6* 8.5* 8.3*  HCT 25.6* 25.1* 25.2*  MCV 89.8 91.3 93.0  PLT 157 201 197    Recent Results (from the past 240 hour(s))  Urine culture     Status: None (Preliminary result)   Collection Time: 03/20/15  5:33 PM  Result Value Ref Range Status   Specimen Description URINE, CATHETERIZED  Final   Special Requests Immunocompromised  Final   Culture   Final    TOO YOUNG TO READ Performed at [facility]    Report Status PENDING  Incomplete     Scheduled Meds: . finasteride  5 mg Oral Daily  . hydrALAZINE  50 mg Oral 3 times per day  . piperacillin-tazobactam (ZOSYN)  IV  3.375 g Intravenous 3 times per day  . sodium chloride  3 mL Intravenous Q12H  . tamsulosin  0.4 mg Oral Daily  . vancomycin  1,250 mg Intravenous Q24H  . warfarin  2 mg Oral Daily  . Warfarin - Physician Dosing Inpatient   Does not apply Daily   Continuous Infusions:

## 2015-03-21 NOTE — Progress Notes (Signed)
Patient with a run of SVT HR reaching 175, temp 102.9. Pt asymptomatic. NP on call notified. New orders placed. Will continue to monitor closely

## 2015-03-21 NOTE — Progress Notes (Signed)
3ANTIBIOTIC CONSULT NOTE - INITIAL  Pharmacy Consult for Vancomycin / Zosyn Indication: Sepsis secondary to possible UTI  No Known Allergies  Patient Measurements: Height: _0  (172.7 cm) Weight: 197 lb 12 oz (89.7 kg) IBW/kg (Calculated) : 68.4 Adjusted Body Weight:   Vital Signs: Temp: 99.3 F (37.4 C) (11/17 1900) Temp Source: Oral (11/17 1900) BP: 136/82 mmHg (11/17 1422) Pulse Rate: 110 (11/17 1422) Intake/Output from previous day: 11/16 0701 - 11/17 0700 In: 290 [P.O.:240; IV Piggyback:50] Out: 3600 [Urine:3600] Intake/Output from this shift:    Labs:  Recent Labs  03/20/15 1640 03/21/15 0427  WBC 11.2* 9.6  HGB 8.5* 8.3*  PLT 201 197  CREATININE 2.03* 2.13*   Estimated Creatinine Clearance: 36.6 mL/min (by C-G formula based on Cr of 2.13). No results for input(s): VANCOTROUGH, VANCOPEAK, VANCORANDOM, GENTTROUGH, GENTPEAK, GENTRANDOM, TOBRATROUGH, TOBRAPEAK, TOBRARND, AMIKACINPEAK, AMIKACINTROU, AMIKACIN in the last 72 hours.   Microbiology: Recent Results (from the past 720 hour(s))  Surgical pcr screen     Status: Abnormal   Collection Time: 02/21/15  1:49 PM  Result Value Ref Range Status   MRSA, PCR NEGATIVE NEGATIVE Final   Staphylococcus aureus POSITIVE (A) NEGATIVE Final    Comment:        The Xpert SA Assay (FDA approved for NASAL specimens in patients over 38 years of age), is one component of a comprehensive surveillance program.  Test performance has been validated by [facility] for patients greater than or equal to 67 year old. It is not intended to diagnose infection nor to guide or monitor treatment.   Blood culture (routine x 2)     Status: None (Preliminary result)   Collection Time: 03/20/15  4:40 PM  Result Value Ref Range Status   Specimen Description BLOOD RIGHT ANTECUBITAL  Final   Special Requests BOTTLES DRAWN AEROBIC AND ANAEROBIC 5CC  Final   Culture  Setup Time   Final    GRAM POSITIVE COCCI IN CLUSTERS AEROBIC  BOTTLE ONLY CRITICAL RESULT CALLED TO, READ BACK BY AND VERIFIED WITH: Franne Grip RN 2009 03/21/15 A BROWNING    Culture   Final    NO GROWTH < 24 HOURS Performed at [facility]    Report Status PENDING  Incomplete  Blood culture (routine x 2)     Status: None (Preliminary result)   Collection Time: 03/20/15  5:30 PM  Result Value Ref Range Status   Specimen Description BLOOD RIGHT HAND  Final   Special Requests BOTTLES DRAWN AEROBIC AND ANAEROBIC 5CC  Final   Culture  Setup Time   Final    GRAM NEGATIVE RODS ANAEROBIC BOTTLE ONLY CRITICAL RESULT CALLED TO, READ BACK BY AND VERIFIED WITH: A MELTON,RN AT 1551 03/21/15 BY L BENFIELD    Culture   Final    GRAM NEGATIVE RODS Performed at [facility]    Report Status PENDING  Incomplete  Urine culture     Status: None (Preliminary result)   Collection Time: 03/20/15  5:33 PM  Result Value Ref Range Status   Specimen Description URINE, CATHETERIZED  Final   Special Requests Immunocompromised  Final   Culture   Final    TOO YOUNG TO READ Performed at [facility]    Report Status PENDING  Incomplete    Medical History: Past Medical History  Diagnosis Date  . Pulmonary nodule, right     W/  CHEST WALL MASS--  SCHEDULED FOR BIOPSY W/ DR [name] ON FRIDAY 10-27-2013  .  History of seizures as a child     AGE 69 OR 8--- NONE ISSUE SINCE  . History of GI bleed     SECONDARY TO ULCER --  DEC 2011  RESOLVED PER LAST EGD 07-05-2010  . Bladder tumor   . Hematuria   . Radiation 12/07/13-12/27/13    right anterior chest 37.5 gray  . Fracture of radius 09/29/14    left  . Cancer [facility])     multiple myeloma, bladder cancer 2015   Assessment: 69 yoM with hx multiple myeloma on chemotherapy and warfarin for VTE px, last received velcade on 03/14/15 , recent hospitalization for HTN, ARF 2/2 bilateral hydronephrosis, and weakness presents to [facility] on 11/16 with fever and HTN.   Pharmacy consulted to start broad spectrum  antibiotics for sepsis secondary to possible UTI.   Anti-infectives 11/16 >> Vancomycin  >> 11/16 >> Zosyn >>    Vitals/Labs WBC: 11.2 (ANC 8.9) Tm24h: 102.4 Renal: SCr 2.03, CrCl ~38 CG (N36) LA WNL  Cultures 11/16 bloodx2: IP  Goal of Therapy:  Vancomycin trough level 15-20 mcg/ml  Eradication of infection  Plan:  Vancomycin discontinued early today, now with 1/2 blood cultures with GPC  Vancomycin 2066m IV x1 on 11/16, then continue 12545mIV q24h Zosyn 3.375g IV x1 over 30 min, then Zosyn 3.375g IV q8h (infuse over 4 hours) Follow renal function, cultures, drug levels as warranted, clinical course  GrMinda DittoharmD Pager [phone]/17/2016, 9:06 PM

## 2015-03-21 NOTE — Progress Notes (Signed)
A/P- Fever - likely UTI possible pyelonephritis giving cyclical nature of fevers. Pt stable on abx. WBC improved. Urine cx pending.  Left renal atrophy - the hydro on the left has decreased likely due to renal atrophy/poor renal function on the left side. Do not think left perc nephrostomy would improve kidney fxn at this point or is needed due to infection given minimal hydro.   Right hydronephrosis - likely related to bladder outlet obstruction from prostate. Cr improved after foley placed last hospitalization. Foley removed in office. Foley replaced on admission which I agree with to max drain system. I ordered renal U/S to determine if hydro on right improved with bladder drainage. Again , he looks well. I don't think he needs urgent nephrostomy tube.    BPH - large prostate. Pt on finasteride and tamsulosin, but he would need attempt at TURP of median lobe vs. Open simple to quickly and most effectively improve voiding and hydro. Discussed with patient and wife, but he is hesitant to have any surgery. He said he was voiding "350" at a time with a good flow on his urinal at home.   S: Pt reports feeling better. No  Complaints.   Objective: Vital signs in last 24 hours: Temp:  [98.8 F (37.1 C)-102.9 F (39.4 C)] 101.2 F (38.4 C) (11/17 1422) Pulse Rate:  [86-122] 110 (11/17 1422) Resp:  [15-25] 18 (11/17 1422) BP: (126-152)/(74-94) 136/82 mmHg (11/17 1422) SpO2:  [94 %-99 %] 99 % (11/17 1422) Weight:  [88.451 kg (195 lb)-89.994 kg (198 lb 6.4 oz)] 89.7 kg (197 lb 12 oz) (11/17 0544)  Intake/Output from previous day: 11/16 0701 - 11/17 0700 In: 290 [P.O.:240; IV Piggyback:50] Out: 3600 [Urine:3600] Intake/Output this shift: Total I/O In: -  Out: 1200 [Urine:1200]  Physical Exam:  NAD Urine clear in tubing - good UOP.  No CVA tenderness.    Lab Results:  Recent Labs  03/20/15 1640 03/21/15 0427  HGB 8.5* 8.3*  HCT 25.1* 25.2*   BMET  Recent Labs   03/20/15 1640 03/21/15 0427  NA 135 138  K 4.3 4.7  CL 106 111  CO2 20* 20*  GLUCOSE 121* 119*  BUN 30* 27*  CREATININE 2.03* 2.13*  CALCIUM 9.0 8.8*    Recent Labs  03/20/15 1640  INR 1.16   No results for input(s): LABURIN in the last 72 hours. Results for orders placed or performed during the hospital encounter of 03/20/15  Blood culture (routine x 2)     Status: None (Preliminary result)   Collection Time: 03/20/15  4:40 PM  Result Value Ref Range Status   Specimen Description BLOOD RIGHT ANTECUBITAL  Final   Special Requests BOTTLES DRAWN AEROBIC AND ANAEROBIC 5CC  Final   Culture   Final    NO GROWTH < 24 HOURS Performed at [facility]    Report Status PENDING  Incomplete  Blood culture (routine x 2)     Status: None (Preliminary result)   Collection Time: 03/20/15  5:30 PM  Result Value Ref Range Status   Specimen Description BLOOD RIGHT HAND  Final   Special Requests BOTTLES DRAWN AEROBIC AND ANAEROBIC 5CC  Final   Culture  Setup Time   Final    GRAM NEGATIVE RODS ANAEROBIC BOTTLE ONLY CRITICAL RESULT CALLED TO, READ BACK BY AND VERIFIED WITH: A MELTON,RN AT 1551 03/21/15 BY L BENFIELD    Culture   Final    GRAM NEGATIVE RODS Performed at [facility]    Report Status PENDING  Incomplete  Urine culture     Status: None (Preliminary result)   Collection Time: 03/20/15  5:33 PM  Result Value Ref Range Status   Specimen Description URINE, CATHETERIZED  Final   Special Requests Immunocompromised  Final   Culture   Final    TOO YOUNG TO READ Performed at [facility]    Report Status PENDING  Incomplete    Studies/Results: Ct Abdomen Pelvis Wo Contrast  03/20/2015  CLINICAL DATA:  Fever, h/o bilat hydronephrosis, bladder tumor diagnosed 2015 EXAM: CT ABDOMEN AND PELVIS WITHOUT CONTRAST TECHNIQUE: Multidetector CT imaging of the abdomen and pelvis was performed following the standard protocol without IV contrast. COMPARISON:   02/17/2015 FINDINGS: 1 cm subpleural nodule in the posterolateral left lower lobe, stable. New 6 mm subpleural nodule in the posterolateral right lower lobe image 2/6, and and several proximal less sharply marginated nodular densities in the posterior basal segment right lower lobe. Mixed lytic/sclerotic lesion in the lateral aspect right seventh rib as before. Unremarkable liver, nondilated gallbladder, spleen, adrenal glands, pancreas. Unenhanced CT was performed per clinician order. Lack of IV contrast limits sensitivity and specificity, especially for evaluation of abdominal/pelvic solid viscera. Moderate right hydronephrosis and ureterectasis down to the ureteral orifice as before. The left hydronephrosis has improved. Left renal parenchymal atrophy. 5 cm mass medial to the left kidney abutting left renal vein, previously 2.6 cm. Stomach is decompressed. Small bowel nondilated. Normal appendix. Moderate proximal colonic fecal material, decompressed distally. Distended urinary bladder with diffuse wall thickening. The mastoid there is massive prostatic enlargement, protruding into the lumen of the urinary bladder in a lobular fashion. Bilateral pelvic phleboliths. Patchy iliac arterial calcifications. No ascites. No free air. No adenopathy localized. Facet DJD in the lower lumbar spine. IMPRESSION: 1. New right lower lobe pulmonary nodule. 2. 5 cm left retroperitoneal mass at the level of the left renal vein, previously 2.6 cm. 3. Persistent moderate right hydronephrosis and ureterectasis. 4. Marked prostatic enlargement protruding into the lumen of the urinary bladder, with wall thickening suggesting a degree of bladder outlet obstruction. 5. Stable right seventh rib lytic/sclerotic lesion. No new bone lesions identified. Electronically Signed   By: Lucrezia Europe M.D.   On: 03/20/2015 17:17   Dg Chest 2 View  03/20/2015  CLINICAL DATA:  Acute onset of fever. Current history of chest wall mass, status post  biopsy. Initial encounter. EXAM: CHEST  2 VIEW COMPARISON:  Chest radiograph performed 02/17/2015 FINDINGS: Resorption of the right lateral seventh rib and associated right lateral chest wall mass have increased significantly in size, now measuring 4.5 x 1.8 cm. Would correlate with prior biopsy results. The lungs are hypoexpanded, with elevation of the right hemidiaphragm. No pleural effusion or pneumothorax is seen. The heart is borderline enlarged. No acute osseous abnormalities are identified. Scattered clips are noted overlying the left axilla. IMPRESSION: 1. Resorption of the right lateral seventh rib and associated right lateral chest wall mass have increased significantly in size, now measuring 4.5 x 1.8 cm. Would correlate with prior biopsy results. 2. Lungs hypoexpanded, with elevation of the right hemidiaphragm. Borderline cardiomegaly. Electronically Signed   By: Garald Balding M.D.   On: 03/20/2015 18:47       LOS: 1 day   Cortavius Montesinos 03/21/2015, 4:25 PM

## 2015-03-21 NOTE — Progress Notes (Signed)
MD notified  Orders received. SRP, RN

## 2015-03-21 NOTE — Progress Notes (Signed)
Lab representative called results of Anaerobic bottle for blood cultures, gram negative rods. Result text paged to provider.

## 2015-03-22 ENCOUNTER — Inpatient Hospital Stay (HOSPITAL_COMMUNITY): Payer: Medicare HMO

## 2015-03-22 NOTE — Care Management Important Message (Signed)
Important Message  Patient Details  Name: Wesley [name] MRN: [id number] Date of Birth: [birth year]   Medicare Important Message Given:  Yes    Shelda Altes 03/22/2015, 12:40 [place] Message  Patient Details  Name: Wesley [name] MRN: [id number] Date of Birth: 06-08-1945   Medicare Important Message Given:  Yes    Shelda Altes 03/22/2015, 12:40 PM

## 2015-03-22 NOTE — Progress Notes (Signed)
Subjective:  A/P -  Right hydronephrosis - UOP has been excellent with foley -- 4.2 L + . He has some persistent hydro on the U/S but the calyces look much less distended compared to the CT; they are not tense. I dont think a Nx tube would add much at this point.   BPH - large prostate/median lobe noted on U/S. Needs open simple to surgically treat. I discussed with patient risk of surveillance (medical tx) of recurrent UTI, bleeding, retention,  continued hydro, renal failure, etc. He will continue finasteride and tamsulosin. Discussed open simple - risks of bleeding, incontinence, stricture among others. He wants to consider.   Once cx's are back and pt on appropriate abx can d/c foley. I'll arrange for PVR in office next week or two. If pt willing can go home with foley, too, but he wanted to try and get it out.   UTI - blood cx +, urine cx mixed GPC and pseudomonas. Sensitivities pending.    S: Pt without complaints. Eating lunch.   Objective: Vital signs in last 24 hours: Temp:  [99.3 F (37.4 C)-101.2 F (38.4 C)] 99.4 F (37.4 C) (11/18 0453) Pulse Rate:  [99-110] 99 (11/18 0453) Resp:  [16-18] 16 (11/18 0453) BP: (135-141)/(78-82) 135/78 mmHg (11/18 0453) SpO2:  [99 %-100 %] 100 % (11/18 0453) Weight:  [88.3 kg (194 lb 10.7 oz)] 88.3 kg (194 lb 10.7 oz) (11/18 0453)  Intake/Output from previous day: 11/17 0701 - 11/18 0700 In: 480 [P.O.:480] Out: 4175 [Urine:4175] Intake/Output this shift: Total I/O In: 240 [P.O.:240] Out: 1000 [Urine:1000]  Physical Exam:  NAd GU - urine clear  -- another 700 ml urine in the bag.   Lab Results:  Recent Labs  03/20/15 1640 03/21/15 0427  HGB 8.5* 8.3*  HCT 25.1* 25.2*   BMET  Recent Labs  03/20/15 1640 03/21/15 0427  NA 135 138  K 4.3 4.7  CL 106 111  CO2 20* 20*  GLUCOSE 121* 119*  BUN 30* 27*  CREATININE 2.03* 2.13*  CALCIUM 9.0 8.8*    Recent Labs  03/20/15 1640  INR 1.16   No results for input(s):  LABURIN in the last 72 hours. Results for orders placed or performed during the hospital encounter of 03/20/15  Blood culture (routine x 2)     Status: None (Preliminary result)   Collection Time: 03/20/15  4:40 PM  Result Value Ref Range Status   Specimen Description BLOOD RIGHT ANTECUBITAL  Final   Special Requests BOTTLES DRAWN AEROBIC AND ANAEROBIC 5CC  Final   Culture  Setup Time   Final    GRAM POSITIVE COCCI IN CLUSTERS AEROBIC BOTTLE ONLY CRITICAL RESULT CALLED TO, READ BACK BY AND VERIFIED WITH: Franne Grip RN 2009 03/21/15 A BROWNING    Culture   Final    CULTURE REINCUBATED FOR BETTER GROWTH Performed at [facility]    Report Status PENDING  Incomplete  Blood culture (routine x 2)     Status: None (Preliminary result)   Collection Time: 03/20/15  5:30 PM  Result Value Ref Range Status   Specimen Description BLOOD RIGHT HAND  Final   Special Requests BOTTLES DRAWN AEROBIC AND ANAEROBIC 5CC  Final   Culture  Setup Time   Final    GRAM NEGATIVE RODS ANAEROBIC BOTTLE ONLY CRITICAL RESULT CALLED TO, READ BACK BY AND VERIFIED WITH: A MELTON,RN AT 1551 03/21/15 BY L BENFIELD    Culture   Final    GRAM NEGATIVE  RODS Performed at [facility]    Report Status PENDING  Incomplete  Urine culture     Status: None (Preliminary result)   Collection Time: 03/20/15  5:33 PM  Result Value Ref Range Status   Specimen Description URINE, CATHETERIZED  Final   Special Requests Immunocompromised  Final   Culture   Final    >=100,000 COLONIES/mL PSEUDOMONAS AERUGINOSA 80,000 COLONIES/ml GRAM POSITIVE COCCI Performed at [facility]    Report Status PENDING  Incomplete    Studies/Results: Ct Abdomen Pelvis Wo Contrast  03/20/2015  CLINICAL DATA:  Fever, h/o bilat hydronephrosis, bladder tumor diagnosed 2015 EXAM: CT ABDOMEN AND PELVIS WITHOUT CONTRAST TECHNIQUE: Multidetector CT imaging of the abdomen and pelvis was performed following the standard protocol  without IV contrast. COMPARISON:  02/17/2015 FINDINGS: 1 cm subpleural nodule in the posterolateral left lower lobe, stable. New 6 mm subpleural nodule in the posterolateral right lower lobe image 2/6, and and several proximal less sharply marginated nodular densities in the posterior basal segment right lower lobe. Mixed lytic/sclerotic lesion in the lateral aspect right seventh rib as before. Unremarkable liver, nondilated gallbladder, spleen, adrenal glands, pancreas. Unenhanced CT was performed per clinician order. Lack of IV contrast limits sensitivity and specificity, especially for evaluation of abdominal/pelvic solid viscera. Moderate right hydronephrosis and ureterectasis down to the ureteral orifice as before. The left hydronephrosis has improved. Left renal parenchymal atrophy. 5 cm mass medial to the left kidney abutting left renal vein, previously 2.6 cm. Stomach is decompressed. Small bowel nondilated. Normal appendix. Moderate proximal colonic fecal material, decompressed distally. Distended urinary bladder with diffuse wall thickening. The mastoid there is massive prostatic enlargement, protruding into the lumen of the urinary bladder in a lobular fashion. Bilateral pelvic phleboliths. Patchy iliac arterial calcifications. No ascites. No free air. No adenopathy localized. Facet DJD in the lower lumbar spine. IMPRESSION: 1. New right lower lobe pulmonary nodule. 2. 5 cm left retroperitoneal mass at the level of the left renal vein, previously 2.6 cm. 3. Persistent moderate right hydronephrosis and ureterectasis. 4. Marked prostatic enlargement protruding into the lumen of the urinary bladder, with wall thickening suggesting a degree of bladder outlet obstruction. 5. Stable right seventh rib lytic/sclerotic lesion. No new bone lesions identified. Electronically Signed   By: Lucrezia Europe M.D.   On: 03/20/2015 17:17   Dg Chest 2 View  03/20/2015  CLINICAL DATA:  Acute onset of fever. Current history of  chest wall mass, status post biopsy. Initial encounter. EXAM: CHEST  2 VIEW COMPARISON:  Chest radiograph performed 02/17/2015 FINDINGS: Resorption of the right lateral seventh rib and associated right lateral chest wall mass have increased significantly in size, now measuring 4.5 x 1.8 cm. Would correlate with prior biopsy results. The lungs are hypoexpanded, with elevation of the right hemidiaphragm. No pleural effusion or pneumothorax is seen. The heart is borderline enlarged. No acute osseous abnormalities are identified. Scattered clips are noted overlying the left axilla. IMPRESSION: 1. Resorption of the right lateral seventh rib and associated right lateral chest wall mass have increased significantly in size, now measuring 4.5 x 1.8 cm. Would correlate with prior biopsy results. 2. Lungs hypoexpanded, with elevation of the right hemidiaphragm. Borderline cardiomegaly. Electronically Signed   By: Garald Balding M.D.   On: 03/20/2015 18:47   US Renal  03/22/2015  CLINICAL DATA:  Right-sided hydronephrosis on recent CT examination EXAM: RENAL / URINARY TRACT ULTRASOUND COMPLETE COMPARISON:  03/20/2015 FINDINGS: Right Kidney: Length: 11.8 cm.  Moderate hydronephrosis  is noted. Left Kidney: Length: 9.4 cm. A hypoechoic mass lesion measuring 4.7 cm in greatest dimension is noted adjacent to the left kidney corresponding to that seen on the prior CT examination. Bladder: Decompressed by Foley catheter. Large soft tissue mass lesion is noted within similar to that noted on prior CT. IMPRESSION: Persistent right hydronephrosis despite decompression of the urinary bladder. This raises suspicion of obstructive changes at the ureterovesical junction on the right. Intraluminal mass within the bladder. Hypoechoic mass corresponding to the soft tissue lesion adjacent to the left kidney seen on recent CT. Electronically Signed   By: Inez Catalina M.D.   On: 03/22/2015 11:09    LOS: 2 days   Elsie Sakuma,  Coryn Mosso 03/22/2015, 11:41 AM

## 2015-03-22 NOTE — Evaluation (Signed)
Physical Therapy One Time Evaluation Patient Details Name: Wesley [name] MRN: [id number] DOB: [birth year] Today's Date: 03/22/2015   History of Present Illness  69 yo male with known history of multiple myeloma, radiation to the tumor mass at the right anterior chest under the care of Dr. [name], was on systemic chemotherapy however seen in cancer center 01/31/2015 at which time decision made to hold off on tx due to worsening renal failure, recently discharged on 10/22 after being hospitalized for renal failure and bladder outlet obstruction/bilateral hydronephrosis and admitted 03/20/15 for Sepsis, due to pyelonephritis, bacteremia g- rods and g+ cocci in blood, preliminary report   Clinical Impression  Patient evaluated by Physical Therapy with no further acute PT needs identified. All education has been completed and the patient has no further questions.  Pt mobilizing very well and no further needs identified at this time.  PT is signing off. Thank you for this referral.     Follow Up Recommendations No PT follow up    Equipment Recommendations  None recommended by PT    Recommendations for Other Services       Precautions / Restrictions Precautions Precautions: Fall      Mobility  Bed Mobility Overal bed mobility: Modified Independent                Transfers Overall transfer level: Modified independent                  Ambulation/Gait Ambulation/Gait assistance: Modified independent (Device/Increase time);Supervision Ambulation Distance (Feet): 400 Feet Assistive device: None Gait Pattern/deviations: WFL(Within Functional Limits)     General Gait Details: no unsteady gait or LOB observed, no symptoms during ambulation, HR 116  Stairs            Wheelchair Mobility    Modified Rankin (Stroke Patients Only)       Balance Overall balance assessment: No apparent balance deficits (not formally assessed)                                           Pertinent Vitals/Pain Pain Assessment: No/denies pain    Home Living Family/patient expects to be discharged to:: Private residence Living Arrangements: Spouse/significant other Available Help at Discharge: Family Type of Home: House       Home Layout: One level Home Equipment: None      Prior Function Level of Independence: Independent               Hand Dominance        Extremity/Trunk Assessment   Upper Extremity Assessment: Overall WFL for tasks assessed           Lower Extremity Assessment: Overall WFL for tasks assessed      Cervical / Trunk Assessment: Normal  Communication   Communication: No difficulties  Cognition Arousal/Alertness: Awake/alert Behavior During Therapy: WFL for tasks assessed/performed Overall Cognitive Status: Within Functional Limits for tasks assessed                      General Comments      Exercises        Assessment/Plan    PT Assessment Patent does not need any further PT services  PT Diagnosis Difficulty walking   PT Problem List    PT Treatment Interventions     PT Goals (Current goals can be found in the Care Plan section)  Acute Rehab PT Goals PT Goal Formulation: All assessment and education complete, DC therapy    Frequency     Barriers to discharge        Co-evaluation               End of Session   Activity Tolerance: Patient tolerated treatment well Patient left: in chair;with call bell/phone within reach;with chair alarm set Nurse Communication: Mobility status         Time: 9861-4830 PT Time Calculation (min) (ACUTE ONLY): 10 min   Charges:   PT Evaluation $Initial PT Evaluation Tier I: 1 Procedure     PT G Codes:        Grayden Burley,KATHrine E 03/22/2015, 1:16 PM Carmelia Bake, PT, DPT 03/22/2015 Pager: [phone]

## 2015-03-22 NOTE — Progress Notes (Signed)
Patient ID: Wesley [name], male   DOB: 11/21/1945, 69 y.o.   MRN: [id number]  [facility] PROGRESS NOTE  Wesley [name] NOM:[id number] DOB: 1945/09/14 DOA: 03/20/2015 PCP: [name], MD   Brief narrative:    Pt is 69 yo male with known history of multiple myeloma (follows with Dr [name]), radiation to the tumor mass at the right anterior chest under the care of Dr. [name] completed 12/07/2013 through 12/27/2013, was on systemic chemotherapy with Velcade, Revlimid and Decadron, seen in cancer center 01/31/2015 at which time decision made to hold off on tx due to worsening renal failure, recently discharged on 10/22 after being hospitalized for renal failure and bladder outlet obstruction/bilateral hydronephrosis. He now presents to [facility] ED with main concern of several days duration of fevers up to 102 F with malaise and poor oral intake.   In ED, pt is hemodynamically stable with stable VS, blood work notable for WBC 11.2, Hg 8.5, creatinine decreased from 5.11 during his last hospitalization to 2.03. Pt started on vanc and zosyn in ED, urology consulted and [facility] asked to admit for further evaluation.   Assessment/Plan:    Principal Problem:  Sepsis ([place]), due to pyelonephritis, bacteremia g- rods and g+ cocci in blood, preliminary report  - pt met criteria for sepsis on admission with HR > 90, RR > 22, T 102.4, source UTI - continue zosyn day #3, vanc re added as prelim blood cultures with g+ cocci  - follow up on blood and urine cultures, narrow abx as clinically indicated  - fever curve trending down  Active Problems:  Kappa light chain myeloma (HCC) - with new lytic lesions identified on the right side of the ribs - new pulmonary nodule also noted - notified oncologist about pt's admission    Bilateral hydronephrosis - renal US requested by urologist  - currently no indications for an interventions    General weakness - secondary to progressive MM - PT  evaluation requested    UTI (lower urinary tract infection) - ABX as noted above - follow up on urine cultures    CKD (chronic kidney disease) stage 5, GFR less than 15 ml/min (HCC) - Cr at baseline ~2 - BMP in AM   Antineoplastic chemotherapy induced anemia - no signs of bleeding - CBC in AM   Essential hypertension - reasonably stable on admission   On coumadin for DVT prophylaxis   Radiological Exams on Admission: Ct Abdomen Pelvis Wo Contrast 03/20/2015 New right lower lobe pulmonary nodule. 2. 5 cm left retroperitoneal mass at the level of the left renal vein, previously 2.6 cm. 3. Persistent moderate right hydronephrosis and ureterectasis. 4. Marked prostatic enlargement protruding into the lumen of the urinary bladder, with wall thickening suggesting a degree of bladder outlet obstruction. 5. Stable right seventh rib lytic/sclerotic lesion. No new bone lesions identified.   Dg Chest 2 View 03/20/2015 Resorption of the right lateral seventh rib and associated right lateral chest wall mass have increased significantly in size, now measuring 4.5 x 1.8 cm. Would correlate with prior biopsy results. 2. Lungs hypoexpanded, with elevation of the right hemidiaphragm. Borderline cardiomegaly.  DVT prophylaxis  Code Status: Full.  Family Communication:  plan of care discussed with the patient Disposition Plan: Home when stable, possibly in 24 - 48 hours when sepsis etiology resolves    IV access:  Peripheral IV  Procedures and diagnostic studies:    Ct Abdomen Pelvis Wo Contrast  03/20/2015  CLINICAL DATA:  Fever, h/o bilat hydronephrosis,  bladder tumor diagnosed 2015 EXAM: CT ABDOMEN AND PELVIS WITHOUT CONTRAST TECHNIQUE: Multidetector CT imaging of the abdomen and pelvis was performed following the standard protocol without IV contrast. COMPARISON:  02/17/2015 FINDINGS: 1 cm subpleural nodule in the posterolateral left lower lobe, stable. New 6 mm subpleural nodule in  the posterolateral right lower lobe image 2/6, and and several proximal less sharply marginated nodular densities in the posterior basal segment right lower lobe. Mixed lytic/sclerotic lesion in the lateral aspect right seventh rib as before. Unremarkable liver, nondilated gallbladder, spleen, adrenal glands, pancreas. Unenhanced CT was performed per clinician order. Lack of IV contrast limits sensitivity and specificity, especially for evaluation of abdominal/pelvic solid viscera. Moderate right hydronephrosis and ureterectasis down to the ureteral orifice as before. The left hydronephrosis has improved. Left renal parenchymal atrophy. 5 cm mass medial to the left kidney abutting left renal vein, previously 2.6 cm. Stomach is decompressed. Small bowel nondilated. Normal appendix. Moderate proximal colonic fecal material, decompressed distally. Distended urinary bladder with diffuse wall thickening. The mastoid there is massive prostatic enlargement, protruding into the lumen of the urinary bladder in a lobular fashion. Bilateral pelvic phleboliths. Patchy iliac arterial calcifications. No ascites. No free air. No adenopathy localized. Facet DJD in the lower lumbar spine. IMPRESSION: 1. New right lower lobe pulmonary nodule. 2. 5 cm left retroperitoneal mass at the level of the left renal vein, previously 2.6 cm. 3. Persistent moderate right hydronephrosis and ureterectasis. 4. Marked prostatic enlargement protruding into the lumen of the urinary bladder, with wall thickening suggesting a degree of bladder outlet obstruction. 5. Stable right seventh rib lytic/sclerotic lesion. No new bone lesions identified. Electronically Signed   By: Lucrezia Europe M.D.   On: 03/20/2015 17:17   Dg Chest 2 View  03/20/2015  CLINICAL DATA:  Acute onset of fever. Current history of chest wall mass, status post biopsy. Initial encounter. EXAM: CHEST  2 VIEW COMPARISON:  Chest radiograph performed 02/17/2015 FINDINGS: Resorption of the  right lateral seventh rib and associated right lateral chest wall mass have increased significantly in size, now measuring 4.5 x 1.8 cm. Would correlate with prior biopsy results. The lungs are hypoexpanded, with elevation of the right hemidiaphragm. No pleural effusion or pneumothorax is seen. The heart is borderline enlarged. No acute osseous abnormalities are identified. Scattered clips are noted overlying the left axilla. IMPRESSION: 1. Resorption of the right lateral seventh rib and associated right lateral chest wall mass have increased significantly in size, now measuring 4.5 x 1.8 cm. Would correlate with prior biopsy results. 2. Lungs hypoexpanded, with elevation of the right hemidiaphragm. Borderline cardiomegaly. Electronically Signed   By: Garald Balding M.D.   On: 03/20/2015 18:47    Medical Consultants:  Urology   Other Consultants:  PT  IAnti-Infectives:   Vanc and Zosyn 11/17 -->  Faye Ramsay, MD  [facility] Pager [phone]  If 7PM-7AM, please contact night-coverage [email or web address] Password [email or web address] 03/22/2015, 11:11 AM   LOS: 2 days   HPI/Subjective: No events overnight.   Objective: Filed Vitals:   03/21/15 1422 03/21/15 1900 03/21/15 2319 03/22/15 0453  BP: 136/82  141/78 135/78  Pulse: 110  100 99  Temp: 101.2 F (38.4 C) 99.3 F (37.4 C) 99.9 F (37.7 C) 99.4 F (37.4 C)  TempSrc: Oral Oral Oral Oral  Resp: $Remo'18  16 16  'ouxrb$ Height:      Weight:    88.3 kg (194 lb 10.7 oz)  SpO2: 99%  100% 100%    Intake/Output Summary (Last  24 hours) at 03/22/15 1111 Last data filed at 03/22/15 0900  Gross per 24 hour  Intake    720 ml  Output   3775 ml  Net  -3055 ml    Exam:   General:  Pt is alert, follows commands appropriately, not in acute distress  Cardiovascular: Regular rhythm, tachycardic, S1/S2, no murmurs, no rubs, no gallops  Respiratory: Clear to auscultation bilaterally, no wheezing, no crackles, no rhonchi  Abdomen: Soft, non tender, slightly  distended, bowel sounds present, no guarding   Data Reviewed: Basic Metabolic Panel:  Recent Labs Lab 03/20/15 1640 03/21/15 0427  NA 135 138  K 4.3 4.7  CL 106 111  CO2 20* 20*  GLUCOSE 121* 119*  BUN 30* 27*  CREATININE 2.03* 2.13*  CALCIUM 9.0 8.8*   Liver Function Tests:  Recent Labs Lab 03/20/15 1640  AST 32  ALT 26  ALKPHOS 65  BILITOT 1.0  PROT 7.4  ALBUMIN 3.8   CBC:  Recent Labs Lab 03/20/15 1640 03/21/15 0427  WBC 11.2* 9.6  NEUTROABS 8.9*  --   HGB 8.5* 8.3*  HCT 25.1* 25.2*  MCV 91.3 93.0  PLT 201 197    Recent Results (from the past 240 hour(s))  Blood culture (routine x 2)     Status: None (Preliminary result)   Collection Time: 03/20/15  4:40 PM  Result Value Ref Range Status   Specimen Description BLOOD RIGHT ANTECUBITAL  Final   Special Requests BOTTLES DRAWN AEROBIC AND ANAEROBIC 5CC  Final   Culture  Setup Time   Final    GRAM POSITIVE COCCI IN CLUSTERS AEROBIC BOTTLE ONLY CRITICAL RESULT CALLED TO, READ BACK BY AND VERIFIED WITH: Franne Grip RN 2009 03/21/15 A BROWNING    Culture   Final    CULTURE REINCUBATED FOR BETTER GROWTH Performed at [facility]    Report Status PENDING  Incomplete  Blood culture (routine x 2)     Status: None (Preliminary result)   Collection Time: 03/20/15  5:30 PM  Result Value Ref Range Status   Specimen Description BLOOD RIGHT HAND  Final   Special Requests BOTTLES DRAWN AEROBIC AND ANAEROBIC 5CC  Final   Culture  Setup Time   Final    GRAM NEGATIVE RODS ANAEROBIC BOTTLE ONLY CRITICAL RESULT CALLED TO, READ BACK BY AND VERIFIED WITH: A MELTON,RN AT 1551 03/21/15 BY L BENFIELD    Culture   Final    GRAM NEGATIVE RODS Performed at [facility]    Report Status PENDING  Incomplete  Urine culture     Status: None (Preliminary result)   Collection Time: 03/20/15  5:33 PM  Result Value Ref Range Status   Specimen Description URINE, CATHETERIZED  Final   Special Requests  Immunocompromised  Final   Culture   Final    TOO YOUNG TO READ Performed at [facility]    Report Status PENDING  Incomplete     Scheduled Meds: . finasteride  5 mg Oral Daily  . hydrALAZINE  50 mg Oral 3 times per day  . piperacillin-tazobactam (ZOSYN)  IV  3.375 g Intravenous 3 times per day  . sodium chloride  3 mL Intravenous Q12H  . tamsulosin  0.4 mg Oral Daily  . vancomycin  1,250 mg Intravenous Q24H  . warfarin  2 mg Oral Daily  . Warfarin - Physician Dosing Inpatient   Does not apply Daily   Continuous Infusions:

## 2015-03-23 LAB — BASIC METABOLIC PANEL
Anion gap: 10 (ref 5–15)
BUN: 26 mg/dL — AB (ref 6–20)
CALCIUM: 9.1 mg/dL (ref 8.9–10.3)
CHLORIDE: 109 mmol/L (ref 101–111)
CO2: 21 mmol/L — AB (ref 22–32)
CREATININE: 2.19 mg/dL — AB (ref 0.61–1.24)
GFR calc non Af Amer: 29 mL/min — ABNORMAL LOW (ref >60)
GFR, EST AFRICAN AMERICAN: 34 mL/min — AB (ref >60)
GLUCOSE: 112 mg/dL — AB (ref 65–99)
Potassium: 4.6 mmol/L (ref 3.5–5.1)
Sodium: 140 mmol/L (ref 135–145)

## 2015-03-23 LAB — CBC
HCT: 23.8 % — ABNORMAL LOW (ref 39.0–52.0)
Hemoglobin: 8.1 g/dL — ABNORMAL LOW (ref 13.0–17.0)
MCH: 31 pg (ref 26.0–34.0)
MCHC: 34 g/dL (ref 30.0–36.0)
MCV: 91.2 fL (ref 78.0–100.0)
PLATELETS: 195 10*3/uL (ref 150–400)
RBC: 2.61 MIL/uL — ABNORMAL LOW (ref 4.22–5.81)
RDW: 17.3 % — AB (ref 11.5–15.5)
WBC: 4.9 10*3/uL (ref 4.0–10.5)

## 2015-03-23 LAB — PROTIME-INR
INR: 1.18 (ref 0.00–1.49)
PROTHROMBIN TIME: 15.1 s (ref 11.6–15.2)

## 2015-03-23 MED ORDER — TAMSULOSIN HCL 0.4 MG PO CAPS: 0.4000 mg | ORAL_CAPSULE | Freq: Every day | ORAL | Status: DC

## 2015-03-23 NOTE — Progress Notes (Signed)
Patient ID: Wesley [name], male   DOB: 1945-10-09, 69 y.o.   MRN: [id number]  [facility] PROGRESS NOTE  Wesley [name] XYV:[id number] DOB: 18-Jun-1945 DOA: 03/20/2015 PCP: [name], MD   Brief narrative:    Pt is 69 yo male with known history of multiple myeloma (follows with Dr [name]), radiation to the tumor mass at the right anterior chest under the care of Dr. [name] completed 12/07/2013 through 12/27/2013, was on systemic chemotherapy with Velcade, Revlimid and Decadron, seen in cancer center 01/31/2015 at which time decision made to hold off on tx due to worsening renal failure, recently discharged on 10/22 after being hospitalized for renal failure and bladder outlet obstruction/bilateral hydronephrosis. He now presents to [facility] ED with main concern of several days duration of fevers up to 102 F with malaise and poor oral intake.   In ED, pt is hemodynamically stable with stable VS, blood work notable for WBC 11.2, Hg 8.5, creatinine decreased from 5.11 during his last hospitalization to 2.03. Pt started on vanc and zosyn in ED, urology consulted and [facility] asked to admit for further evaluation.   Assessment/Plan:    Principal Problem:  Sepsis ([place]), due to pyelonephritis, bacteremia g- rods and g+ cocci in blood, preliminary report, urine culture with pseudomonas and enterococcus  - pt met criteria for sepsis on admission with HR > 90, RR > 22, T 102.4, source UTI - continue zosyn and vanc day #4, vanc, can not narrow down yet until final cultures are back  - fever curve trending down, WBC is WNL   Active Problems:  Kappa light chain myeloma (HCC) - with new lytic lesions identified on the right side of the ribs - new pulmonary nodule also noted - notified oncologist about pt's admission    Bilateral hydronephrosis - renal US requested by urologist  - currently no indications for an interventions  - follow up on urology recommendations    General weakness -  secondary to progressive MM - PT evaluation requested    UTI (lower urinary tract infection), pseudomonas and enterococcus  - ABX as noted above - follow up on urine cultures final report    CKD (chronic kidney disease) stage 5, GFR less than 15 ml/min (HCC) - Cr at baseline ~2 - Cr remains around baseline  - BMP in AM   Antineoplastic chemotherapy induced anemia - no signs of bleeding - CBC in AM   Essential hypertension - reasonably stable on admission   On coumadin for DVT prophylaxis   Radiological Exams on Admission: Ct Abdomen Pelvis Wo Contrast 03/20/2015 New right lower lobe pulmonary nodule. 2. 5 cm left retroperitoneal mass at the level of the left renal vein, previously 2.6 cm. 3. Persistent moderate right hydronephrosis and ureterectasis. 4. Marked prostatic enlargement protruding into the lumen of the urinary bladder, with wall thickening suggesting a degree of bladder outlet obstruction. 5. Stable right seventh rib lytic/sclerotic lesion. No new bone lesions identified.   Dg Chest 2 View 03/20/2015 Resorption of the right lateral seventh rib and associated right lateral chest wall mass have increased significantly in size, now measuring 4.5 x 1.8 cm. Would correlate with prior biopsy results. 2. Lungs hypoexpanded, with elevation of the right hemidiaphragm. Borderline cardiomegaly.  DVT prophylaxis  Code Status: Full.  Family Communication:  plan of care discussed with the patient Disposition Plan: Home when stable, possibly in 24 - 48 hours when blood and urine cultures back final report   IV access:  Peripheral IV  Procedures  and diagnostic studies:    Ct Abdomen Pelvis Wo Contrast 03/20/2015   New right lower lobe pulmonary nodule. 2. 5 cm left retroperitoneal mass at the level of the left renal vein, previously 2.6 cm. 3. Persistent moderate right hydronephrosis and ureterectasis. 4. Marked prostatic enlargement protruding into the lumen of the urinary  bladder, with wall thickening suggesting a degree of bladder outlet obstruction. 5. Stable right seventh rib lytic/sclerotic lesion. No new bone lesions identified.   Dg Chest 2 View 03/20/2015  Resorption of the right lateral seventh rib and associated right lateral chest wall mass have increased significantly in size, now measuring 4.5 x 1.8 cm. Would correlate with prior biopsy results. 2. Lungs hypoexpanded, with elevation of the right hemidiaphragm. Borderline cardiomegaly.  US Renal 03/22/2015 Persistent right hydronephrosis despite decompression of the urinary bladder. This raises suspicion of obstructive changes at the ureterovesical junction on the right. Intraluminal mass within the bladder. Hypoechoic mass corresponding to the soft tissue lesion adjacent to the left kidney seen on recent CT.   Medical Consultants:  Urology   Other Consultants:  PT  IAnti-Infectives:   Vanc and Zosyn 11/17 -->  Faye Ramsay, MD  [facility] Pager [phone]  If 7PM-7AM, please contact night-coverage [email or web address] Password [email or web address] 03/23/2015, 10:28 AM   LOS: 3 days   HPI/Subjective: No events overnight.   Objective: Filed Vitals:   03/22/15 1359 03/22/15 1415 03/22/15 2007 03/23/15 0522  BP: 129/64  104/63 129/80  Pulse: 102  94 85  Temp:  99.5 F (37.5 C) 98.6 F (37 C) 98.2 F (36.8 C)  TempSrc:  Oral Oral Oral  Resp: $Remo'18  16 16  'Ixqgh$ Height:      Weight:    89.495 kg (197 lb 4.8 oz)  SpO2:  100% 100% 99%    Intake/Output Summary (Last 24 hours) at 03/23/15 1028 Last data filed at 03/23/15 0555  Gross per 24 hour  Intake   1160 ml  Output   3400 ml  Net  -2240 ml    Exam:   General:  Pt is alert, follows commands appropriately, not in acute distress  Cardiovascular: Regular rhythm, tachycardic, S1/S2, no murmurs, no rubs, no gallops  Respiratory: Clear to auscultation bilaterally, no wheezing, no crackles, no rhonchi  Abdomen: Soft, non tender, slightly distended, bowel  sounds present, no guarding   Data Reviewed: Basic Metabolic Panel:  Recent Labs Lab 03/20/15 1640 03/21/15 0427 03/23/15 0510  NA 135 138 140  K 4.3 4.7 4.6  CL 106 111 109  CO2 20* 20* 21*  GLUCOSE 121* 119* 112*  BUN 30* 27* 26*  CREATININE 2.03* 2.13* 2.19*  CALCIUM 9.0 8.8* 9.1   Liver Function Tests:  Recent Labs Lab 03/20/15 1640  AST 32  ALT 26  ALKPHOS 65  BILITOT 1.0  PROT 7.4  ALBUMIN 3.8   CBC:  Recent Labs Lab 03/20/15 1640 03/21/15 0427 03/23/15 0510  WBC 11.2* 9.6 4.9  NEUTROABS 8.9*  --   --   HGB 8.5* 8.3* 8.1*  HCT 25.1* 25.2* 23.8*  MCV 91.3 93.0 91.2  PLT 201 197 195    Recent Results (from the past 240 hour(s))  Blood culture (routine x 2)     Status: None (Preliminary result)   Collection Time: 03/20/15  4:40 PM  Result Value Ref Range Status   Specimen Description BLOOD RIGHT ANTECUBITAL  Final   Special Requests BOTTLES DRAWN AEROBIC AND ANAEROBIC 5CC  Final   Culture  Setup Time  Final    GRAM POSITIVE COCCI IN CLUSTERS AEROBIC BOTTLE ONLY CRITICAL RESULT CALLED TO, READ BACK BY AND VERIFIED WITH: Franne Grip RN 2009 03/21/15 A BROWNING    Culture   Final    GRAM POSITIVE COCCI Performed at [facility]    Report Status PENDING  Incomplete  Blood culture (routine x 2)     Status: None (Preliminary result)   Collection Time: 03/20/15  5:30 PM  Result Value Ref Range Status   Specimen Description BLOOD RIGHT HAND  Final   Special Requests BOTTLES DRAWN AEROBIC AND ANAEROBIC 5CC  Final   Culture  Setup Time   Final    GRAM NEGATIVE RODS ANAEROBIC BOTTLE ONLY CRITICAL RESULT CALLED TO, READ BACK BY AND VERIFIED WITH: A MELTON,RN AT 1551 03/21/15 BY L BENFIELD    Culture   Final    PSEUDOMONAS AERUGINOSA SUSCEPTIBILITIES TO FOLLOW Performed at [facility]    Report Status PENDING  Incomplete  Urine culture     Status: None (Preliminary result)   Collection Time: 03/20/15  5:33 PM  Result Value Ref Range  Status   Specimen Description URINE, CATHETERIZED  Final   Special Requests Immunocompromised  Final   Culture   Final    >=100,000 COLONIES/mL PSEUDOMONAS AERUGINOSA REPEATING SENSITIVITIES 80,000 COLONIES/ml ENTEROCOCCUS FAECALIS Performed at [facility]    Report Status PENDING  Incomplete   Organism ID, Bacteria ENTEROCOCCUS FAECALIS  Final      Susceptibility   Enterococcus faecalis - MIC*    AMPICILLIN <=2 SENSITIVE Sensitive     LEVOFLOXACIN 0.25 SENSITIVE Sensitive     NITROFURANTOIN <=16 SENSITIVE Sensitive     VANCOMYCIN 1 SENSITIVE Sensitive     LINEZOLID 2 SENSITIVE Sensitive     * 80,000 COLONIES/ml ENTEROCOCCUS FAECALIS     Scheduled Meds: . finasteride  5 mg Oral Daily  . hydrALAZINE  50 mg Oral 3 times per day  . piperacillin-tazobactam (ZOSYN)  IV  3.375 g Intravenous 3 times per day  . sodium chloride  3 mL Intravenous Q12H  . tamsulosin  0.4 mg Oral Daily  . vancomycin  1,250 mg Intravenous Q24H  . warfarin  2 mg Oral Daily  . Warfarin - Physician Dosing Inpatient   Does not apply Daily   Continuous Infusions:

## 2015-03-23 NOTE — Progress Notes (Signed)
Pharmacy Antibiotic Time-Out Note  Wesley [name] is a 69 y.o. year-old male admitted on 03/20/2015.  The patient is currently on Vancomycin and Zosyn for sepsis 2/2 UTI, bacteremia.  Assessment: 45 yoM with hx multiple myeloma on chemotherapy and warfarin for VTE px, last received velcade on 03/14/15 , recent hospitalization for HTN, ARF 2/2 bilateral hydronephrosis, and weakness presents to [facility] on 11/16 with urosepsis, bacteremia.  Plan: This patient's current antibiotics will be continued without adjustments.  Awaiting cultures to finalize before narrowing per Dr. Tresa Moore.  Temp (24hrs), Avg:98.8 F (37.1 C), Min:98.2 F (36.8 C), Max:99.5 F (37.5 C)   Recent Labs Lab 03/20/15 1640 03/21/15 0427 03/23/15 0510  WBC 11.2* 9.6 4.9    Recent Labs Lab 03/20/15 1640 03/21/15 0427 03/23/15 0510  CREATININE 2.03* 2.13* 2.19*   Estimated Creatinine Clearance: 35.6 mL/min (by C-G formula based on Cr of 2.19).   Antimicrobial allergies: no known  Antimicrobials this admission: 1/16 >> Vanc >> 11/16 >> Zosyn >>  Microbiology Results: 11/16 Blood x 2: 1 with GPC in aerobic bottle only, 1 with Pseudomonas in anaerobic bottle only 11/16 Urine: Pseudomonas (sensitivities pending), Enterococcus (pan-sensitive) 11/19 Blood x 2: collected  Thank you for allowing pharmacy to be a part of this patient's care.  Hershal Coria PharmD 03/23/2015 12:14 PM

## 2015-03-23 NOTE — Progress Notes (Signed)
Subjective:  1 - Urosepsis / Bacteremia - psuedomonas and enterococcus in [place], [place] 03/20/15 on eval fevers and clinical urosepsis. Now on Vanc + Zosyn pharmacy dosed with final CX pending.  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 Has h/o multiple myeloma and onchemo for this  3 - Chronic Renal Insufficiency - Cr baseline 2's. Known partially atrophic left kidney from pararenal mass. Also likely some outlet obstruction.  4 - Very Large Prostate - on finasteride + tamsulosin for mild obstructive symptoms from very large prostate. Vol 24mL+ by CT calculation 03/2015.   5 - Mild Chronic Right Hydronephrosis - mild hydro to level of thickened bladder by CT and [place] x several 2016, likely from BPH related outlet obstruction. Hydro does remain despite foley, though GFR at baseline.   Today "Louie Casa" is stable. Still sith some low grade fevers, but trending better. UCX,BCX with psuedomonas and enterococcus, likely urinary source.   Objective: Vital signs in last 24 hours: Temp:  [98.2 F (36.8 C)-99.5 F (37.5 C)] 98.2 F (36.8 C) (11/19 0522) Pulse Rate:  [85-102] 85 (11/19 0522) Resp:  [16-18] 16 (11/19 0522) BP: (104-129)/(63-80) 129/80 mmHg (11/19 0522) SpO2:  [99 %-100 %] 99 % (11/19 0522) Weight:  [89.495 kg (197 lb 4.8 oz)] 89.495 kg (197 lb 4.8 oz) (11/19 0522) Last BM Date: 03/22/15  Intake/Output from previous day: 11/18 0701 - 11/19 0700 In: 1400 [P.O.:600; IV Piggyback:800] Out: 3400 [Urine:3400] Intake/Output this shift:    General appearance: alert and cooperative Eyes: negative Nose: Nares normal. Septum midline. Mucosa normal. No drainage or sinus tenderness. Throat: lips, mucosa, and tongue normal; teeth and gums normal Neck: no adenopathy, no carotid bruit, no JVD, supple, symmetrical, trachea midline and thyroid not enlarged, symmetric, no tenderness/mass/nodules Back: symmetric, no curvature. ROM normal. No CVA  tenderness. Chest wall: no tenderness Cardio: Nl rate GI: soft, non-tender; bowel sounds normal; no masses,  no organomegaly Male genitalia: normal, foley c/d/i with very light tea-colored urine, no clots.  Extremities: extremities normal, atraumatic, no cyanosis or edema Pulses: 2+ and symmetric Skin: Skin color, texture, turgor normal. No rashes or lesions Lymph nodes: Cervical, supraclavicular, and axillary nodes normal. Neurologic: Grossly normal  Lab Results:   Recent Labs  03/21/15 0427 03/23/15 0510  WBC 9.6 4.9  HGB 8.3* 8.1*  HCT 25.2* 23.8*  PLT 197 195   BMET  Recent Labs  03/21/15 0427 03/23/15 0510  NA 138 140  K 4.7 4.6  CL 111 109  CO2 20* 21*  GLUCOSE 119* 112*  BUN 27* 26*  CREATININE 2.13* 2.19*  CALCIUM 8.8* 9.1   PT/INR  Recent Labs  03/20/15 1640 03/23/15 0510  LABPROT 15.0 15.1  INR 1.16 1.18   ABG No results for input(s): PHART, HCO3 in the last 72 hours.  Invalid input(s): PCO2, PO2  Studies/Results: US Renal  03/22/2015  CLINICAL DATA:  Right-sided hydronephrosis on recent CT examination EXAM: RENAL / URINARY TRACT ULTRASOUND COMPLETE COMPARISON:  03/20/2015 FINDINGS: Right Kidney: Length: 11.8 cm.  Moderate hydronephrosis is noted. Left Kidney: Length: 9.4 cm. A hypoechoic mass lesion measuring 4.7 cm in greatest dimension is noted adjacent to the left kidney corresponding to that seen on the prior CT examination. Bladder: Decompressed by Foley catheter. Large soft tissue mass lesion is noted within similar to that noted on prior CT. IMPRESSION: Persistent right hydronephrosis despite decompression of the urinary bladder. This raises suspicion of obstructive changes at the ureterovesical junction on the right. Intraluminal  mass within the bladder. Hypoechoic mass corresponding to the soft tissue lesion adjacent to the left kidney seen on recent CT. Electronically Signed   By: Inez Catalina M.D.   On: 03/22/2015 11:09     Anti-infectives: Anti-infectives    Start     Dose/Rate Route Frequency Ordered Stop   03/21/15 2200  vancomycin (VANCOCIN) 1,250 mg in sodium chloride 0.9 % 250 mL IVPB     1,250 mg 166.7 mL/hr over 90 Minutes Intravenous Every 24 hours 03/21/15 2110     03/21/15 1800  vancomycin (VANCOCIN) 1,250 mg in sodium chloride 0.9 % 250 mL IVPB  Status:  Discontinued     1,250 mg 166.7 mL/hr over 90 Minutes Intravenous Every 24 hours 03/20/15 1758 03/21/15 1229   03/21/15 0200  piperacillin-tazobactam (ZOSYN) IVPB 3.375 g     3.375 g 12.5 mL/hr over 240 Minutes Intravenous 3 times per day 03/20/15 1800     03/20/15 1715  vancomycin (VANCOCIN) 2,000 mg in sodium chloride 0.9 % 500 mL IVPB     2,000 mg 250 mL/hr over 120 Minutes Intravenous NOW 03/20/15 1709 03/20/15 1958   03/20/15 1715  piperacillin-tazobactam (ZOSYN) IVPB 3.375 g     3.375 g 100 mL/hr over 30 Minutes Intravenous STAT 03/20/15 1709 03/20/15 1800      Assessment/Plan:  1 - Urosepsis / Bacteremia - agree with current ABX pending final CX. He is improving clinically.   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   3 - Chronic Renal Insufficiency - likelyk multifactorial with left renal atrophy, medical renal disease, and some outlet obstruction / right sided hydro. GFR currently at baseline.   4 - Very Large Prostate - continue medical therapy. Will plan on DC foley when afebrile for trial of void.   5 - Mild Chronic Right Hydronephrosis - likely BPH / UVJ angulation changes from very large prostate. This is stable. May consider further eval if GFR declines.   6 - Will follow.  [facility], Carolene Gitto 03/23/2015

## 2015-03-24 ENCOUNTER — Inpatient Hospital Stay (HOSPITAL_COMMUNITY): Payer: Medicare HMO

## 2015-03-24 DIAGNOSIS — A4152 Sepsis due to Pseudomonas: Secondary | ICD-10-CM | POA: Diagnosis present

## 2015-03-24 DIAGNOSIS — N401 Enlarged prostate with lower urinary tract symptoms: Secondary | ICD-10-CM | POA: Diagnosis present

## 2015-03-24 DIAGNOSIS — B952 Enterococcus as the cause of diseases classified elsewhere: Secondary | ICD-10-CM | POA: Diagnosis present

## 2015-03-24 DIAGNOSIS — N390 Urinary tract infection, site not specified: Secondary | ICD-10-CM

## 2015-03-24 DIAGNOSIS — N138 Other obstructive and reflux uropathy: Secondary | ICD-10-CM | POA: Diagnosis present

## 2015-03-24 LAB — URINE CULTURE

## 2015-03-24 LAB — CULTURE, BLOOD (ROUTINE X 2)

## 2015-03-24 NOTE — Progress Notes (Signed)
Subjective:  1 - Urosepsis / Bacteremia - psuedomonas and enterococcus in [place], [place] 03/20/15 on eval fevers and clinical urosepsis. Now on Vanc + Zosyn pharmacy dosed.  Enterococcus pan-sensitive. Pseudomans sensitivities still pending. Repeat BCX 11/19 NGTD.  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 Has h/o multiple myeloma and on chemo for this  3 - Chronic Renal Insufficiency - Cr baseline 2's. Known partially atrophic left kidney from pararenal mass. Also likely some outlet obstruction.  4 - Very Large Prostate - on finasteride + tamsulosin for mild obstructive symptoms from very large prostate. Vol 250mL+ by CT calculation 03/2015. Manged with foley initially this hospitalization, removed for trial of void 11/20.   5 - Mild Chronic Right Hydronephrosis - mild hydro to level of thickened bladder by CT and [place] x several 2016, likely from BPH related outlet obstruction. Hydro does remain despite foley, though GFR at baseline.   Today "Wesley [name]" is stable. Now afebrile. Enterococcus pan-sensitive, but Pseudomonas CX still penidng.   Objective: Vital signs in last 24 hours: Temp:  [97.8 F (36.6 C)-98.1 F (36.7 C)] 97.8 F (36.6 C) (11/20 0543) Pulse Rate:  [81-104] 81 (11/20 0543) Resp:  [18] 18 (11/20 0543) BP: (100-150)/(47-80) 150/80 mmHg (11/20 0543) SpO2:  [96 %-100 %] 100 % (11/20 0543) Weight:  [87.998 kg (194 lb)] 87.998 kg (194 lb) (11/20 0500) Last BM Date: 03/22/15  Intake/Output from previous day: 11/19 0701 - 11/20 0700 In: 350 [IV Piggyback:350] Out: 2700 [Urine:2700] Intake/Output this shift:    EXAM: NAD, finishing breakfase NCAT Breathing non-labored SNTND, NO CVAT Foley with clear urine, removed NO c/c/e  Lab Results:   Recent Labs  03/23/15 0510  WBC 4.9  HGB 8.1*  HCT 23.8*  PLT 195   BMET  Recent Labs  03/23/15 0510  NA 140  K 4.6  CL 109  CO2 21*  GLUCOSE 112*  BUN 26*  CREATININE 2.19*   CALCIUM 9.1   PT/INR  Recent Labs  03/23/15 0510  LABPROT 15.1  INR 1.18   ABG No results for input(s): PHART, HCO3 in the last 72 hours.  Invalid input(s): PCO2, PO2  Studies/Results: US Renal  03/22/2015  CLINICAL DATA:  Right-sided hydronephrosis on recent CT examination EXAM: RENAL / URINARY TRACT ULTRASOUND COMPLETE COMPARISON:  03/20/2015 FINDINGS: Right Kidney: Length: 11.8 cm.  Moderate hydronephrosis is noted. Left Kidney: Length: 9.4 cm. A hypoechoic mass lesion measuring 4.7 cm in greatest dimension is noted adjacent to the left kidney corresponding to that seen on the prior CT examination. Bladder: Decompressed by Foley catheter. Large soft tissue mass lesion is noted within similar to that noted on prior CT. IMPRESSION: Persistent right hydronephrosis despite decompression of the urinary bladder. This raises suspicion of obstructive changes at the ureterovesical junction on the right. Intraluminal mass within the bladder. Hypoechoic mass corresponding to the soft tissue lesion adjacent to the left kidney seen on recent CT. Electronically Signed   By: Inez Catalina M.D.   On: 03/22/2015 11:09    Anti-infectives: Anti-infectives    Start     Dose/Rate Route Frequency Ordered Stop   03/21/15 2200  vancomycin (VANCOCIN) 1,250 mg in sodium chloride 0.9 % 250 mL IVPB     1,250 mg 166.7 mL/hr over 90 Minutes Intravenous Every 24 hours 03/21/15 2110     03/21/15 1800  vancomycin (VANCOCIN) 1,250 mg in sodium chloride 0.9 % 250 mL IVPB  Status:  Discontinued     1,250 mg 166.7  mL/hr over 90 Minutes Intravenous Every 24 hours 03/20/15 1758 03/21/15 1229   03/21/15 0200  piperacillin-tazobactam (ZOSYN) IVPB 3.375 g     3.375 g 12.5 mL/hr over 240 Minutes Intravenous 3 times per day 03/20/15 1800     03/20/15 1715  vancomycin (VANCOCIN) 2,000 mg in sodium chloride 0.9 % 500 mL IVPB     2,000 mg 250 mL/hr over 120 Minutes Intravenous NOW 03/20/15 1709 03/20/15 1958   03/20/15  1715  piperacillin-tazobactam (ZOSYN) IVPB 3.375 g     3.375 g 100 mL/hr over 30 Minutes Intravenous STAT 03/20/15 1709 03/20/15 1800      Assessment/Plan:   1 - Urosepsis / Bacteremia - agree with current ABX pending final CX (psudemonas CX). He is improving clinically.   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   3 - Chronic Renal Insufficiency - likelyk multifactorial with left renal atrophy, medical renal disease, and some outlet obstruction / right sided hydro. GFR currently at baseline.   4 - Very Large Prostate - continue medical therapy. Removed foley today for trial of void. Reiterated to pt and NSG for adequate UOP monitoring and bedside urinal given to pt.   5 - Mild Chronic Right Hydronephrosis - likely BPH / UVJ angulation changes from very large prostate. This is stable. May consider further eval if GFR declines.   6 - Will follow.   [facility], Brooke Payes 03/24/2015

## 2015-03-24 NOTE — Progress Notes (Signed)
Patient ID: Wesley [name], male   DOB: 07/30/1945, 69 y.o.   MRN: [id number]  [facility] PROGRESS NOTE  Wesley [name] MRN:[id number] DOB: 03/13/1946 DOA: 03/20/2015 PCP: [id number],PETER FRANK, MD   Brief narrative:    Pt is 69 yo male with known history of multiple myeloma (follows with Dr Mohamed), radiation to the tumor mass at the right anterior chest under the care of Dr. Kinard completed 12/07/2013 through 12/27/2013, was on systemic chemotherapy with Velcade, Revlimid and Decadron, seen in cancer center 01/31/2015 at which time decision made to hold off on tx due to worsening renal failure, recently discharged on 10/22 after being hospitalized for renal failure and bladder outlet obstruction/bilateral hydronephrosis. He now presents to [facility] ED with main concern of several days duration of fevers up to 102 F with malaise and poor oral intake.   In ED, pt is hemodynamically stable with stable VS, blood work notable for WBC 11.2, Hg 8.5, creatinine decreased from 5.11 during his last hospitalization to 2.03. Pt started on vanc and zosyn in ED, urology consulted and [facility] asked to admit for further evaluation.   Assessment/Plan:    Principal Problem:  Sepsis (HCC), due to pseudomonas bacteremia, pseudomonas and enterococcus UTI - pt met criteria for sepsis on admission with HR > 90, RR > 22, T 102.4, source UTI - continue zosyn and vanc day #5, discussed with ID on call, can change ABX to Cipro and Augmentin upon discharge - pt needs total of 10 days of ABX including what pt already got here  - fever curve trending down, WBC remains WNL   Active Problems:  Kappa light chain myeloma (HCC) - with new lytic lesions identified on the right side of the ribs - new pulmonary nodule also noted - notified oncologist about pt's admission    Bilateral hydronephrosis/Enlarged prostate with outlet obstruction  - foley removed today, monitor urine output  - currently no indications for an  interventions  - follow up on urology recommendations     Head bump - plan for head CT as pt is worried about MM causing it    General weakness - improved, ambulating    UTI (lower urinary tract infection), pseudomonas and enterococcus  - ABX as noted above   CKD (chronic kidney disease) stage 5, GFR less than 15 ml/min (HCC) - Cr at baseline ~2 - Cr remains around baseline  - BMP in AM   Antineoplastic chemotherapy induced anemia - no signs of bleeding - CBC in AM   Essential hypertension - reasonably stable on admission   On coumadin for DVT prophylaxis   Code Status: Full.  Family Communication:  plan of care discussed with the patient Disposition Plan: Home in AM if urology team OK with plan, change ABX to Cipro and Augmentin   IV access:  Peripheral IV  Procedures and diagnostic studies:    Ct Abdomen Pelvis Wo Contrast 03/20/2015   New right lower lobe pulmonary nodule. 2. 5 cm left retroperitoneal mass at the level of the left renal vein, previously 2.6 cm. 3. Persistent moderate right hydronephrosis and ureterectasis. 4. Marked prostatic enlargement protruding into the lumen of the urinary bladder, with wall thickening suggesting a degree of bladder outlet obstruction. 5. Stable right seventh rib lytic/sclerotic lesion. No new bone lesions identified.   Dg Chest 2 View 03/20/2015  Resorption of the right lateral seventh rib and associated right lateral chest wall mass have increased significantly in size, now measuring 4.5 x 1.8 cm. Would correlate   with prior biopsy results. 2. Lungs hypoexpanded, with elevation of the right hemidiaphragm. Borderline cardiomegaly.  US Renal 03/22/2015 Persistent right hydronephrosis despite decompression of the urinary bladder. This raises suspicion of obstructive changes at the ureterovesical junction on the right. Intraluminal mass within the bladder. Hypoechoic mass corresponding to the soft tissue lesion adjacent to the  left kidney seen on recent CT.   Medical Consultants:  Urology  ID over the phone   Other Consultants:  PT  IAnti-Infectives:   Vanc and Zosyn 11/17 --> change to Cipro and Augmentin in AM 11/21   Faye Ramsay, MD  [facility] Pager [phone]  If 7PM-7AM, please contact night-coverage [email or web address] Password [email or web address] 03/24/2015, 2:14 PM   LOS: 4 days   HPI/Subjective: No events overnight.   Objective: Filed Vitals:   03/23/15 2113 03/24/15 0500 03/24/15 0543 03/24/15 1300  BP: 126/77  150/80 110/60  Pulse: 86  81   Temp: 97.9 F (36.6 C)  97.8 F (36.6 C)   TempSrc: Oral  Oral   Resp: 18  18   Height:      Weight:  87.998 kg (194 lb)    SpO2: 100%  100%     Intake/Output Summary (Last 24 hours) at 03/24/15 1414 Last data filed at 03/24/15 1300  Gross per 24 hour  Intake    590 ml  Output   2900 ml  Net  -2310 ml    Exam:   General:  Pt is alert, follows commands appropriately, not in acute distress  Cardiovascular: Regular rhythm, S1/S2, no murmurs, no rubs, no gallops  Respiratory: Clear to auscultation bilaterally, no wheezing, no crackles, no rhonchi  Abdomen: Soft, non tender, slightly distended, bowel sounds present, no guarding   Data Reviewed: Basic Metabolic Panel:  Recent Labs Lab 03/20/15 1640 03/21/15 0427 03/23/15 0510  NA 135 138 140  K 4.3 4.7 4.6  CL 106 111 109  CO2 20* 20* 21*  GLUCOSE 121* 119* 112*  BUN 30* 27* 26*  CREATININE 2.03* 2.13* 2.19*  CALCIUM 9.0 8.8* 9.1   Liver Function Tests:  Recent Labs Lab 03/20/15 1640  AST 32  ALT 26  ALKPHOS 65  BILITOT 1.0  PROT 7.4  ALBUMIN 3.8   CBC:  Recent Labs Lab 03/20/15 1640 03/21/15 0427 03/23/15 0510  WBC 11.2* 9.6 4.9  NEUTROABS 8.9*  --   --   HGB 8.5* 8.3* 8.1*  HCT 25.1* 25.2* 23.8*  MCV 91.3 93.0 91.2  PLT 201 197 195    Recent Results (from the past 240 hour(s))  Blood culture (routine x 2)     Status: None (Preliminary result)   Collection Time:  03/20/15  4:40 PM  Result Value Ref Range Status   Specimen Description BLOOD RIGHT ANTECUBITAL  Final   Special Requests BOTTLES DRAWN AEROBIC AND ANAEROBIC 5CC  Final   Culture  Setup Time   Final    GRAM POSITIVE COCCI IN CLUSTERS AEROBIC BOTTLE ONLY CRITICAL RESULT CALLED TO, READ BACK BY AND VERIFIED WITH: Franne Grip RN 2009 03/21/15 A BROWNING    Culture   Final    STAPHYLOCOCCUS SPECIES (COAGULASE NEGATIVE) Performed at [facility]    Report Status PENDING  Incomplete  Blood culture (routine x 2)     Status: None   Collection Time: 03/20/15  5:30 PM  Result Value Ref Range Status   Specimen Description BLOOD RIGHT HAND  Final   Special Requests BOTTLES DRAWN AEROBIC AND ANAEROBIC 5CC  Final  Culture  Setup Time   Final    GRAM NEGATIVE RODS ANAEROBIC BOTTLE ONLY CRITICAL RESULT CALLED TO, READ BACK BY AND VERIFIED WITH: A MELTON,RN AT 3016 03/21/15 BY L BENFIELD    Culture   Final    PSEUDOMONAS AERUGINOSA Performed at [facility]    Report Status 03/24/2015 FINAL  Final   Organism ID, Bacteria PSEUDOMONAS AERUGINOSA  Final      Susceptibility   Pseudomonas aeruginosa - MIC*    CEFTAZIDIME 4 SENSITIVE Sensitive     CIPROFLOXACIN <=0.25 SENSITIVE Sensitive     GENTAMICIN 4 SENSITIVE Sensitive     PIP/TAZO 8 SENSITIVE Sensitive     CEFEPIME 2 SENSITIVE Sensitive     * PSEUDOMONAS AERUGINOSA  Urine culture     Status: None   Collection Time: 03/20/15  5:33 PM  Result Value Ref Range Status   Specimen Description URINE, CATHETERIZED  Final   Special Requests Immunocompromised  Final   Culture   Final    >=100,000 COLONIES/mL PSEUDOMONAS AERUGINOSA 80,000 COLONIES/ml ENTEROCOCCUS FAECALIS Performed at [facility]    Report Status 03/24/2015 FINAL  Final   Organism ID, Bacteria ENTEROCOCCUS FAECALIS  Final   Organism ID, Bacteria PSEUDOMONAS AERUGINOSA  Final      Susceptibility   Pseudomonas aeruginosa - MIC*    CEFTAZIDIME 4 SENSITIVE  Sensitive     CIPROFLOXACIN <=0.25 SENSITIVE Sensitive     GENTAMICIN 8 INTERMEDIATE Intermediate     PIP/TAZO 8 SENSITIVE Sensitive     CEFEPIME 2 SENSITIVE Sensitive     * >=100,000 COLONIES/mL PSEUDOMONAS AERUGINOSA   Enterococcus faecalis - MIC*    AMPICILLIN <=2 SENSITIVE Sensitive     LEVOFLOXACIN 0.25 SENSITIVE Sensitive     NITROFURANTOIN <=16 SENSITIVE Sensitive     VANCOMYCIN 1 SENSITIVE Sensitive     * 80,000 COLONIES/ml ENTEROCOCCUS FAECALIS     Scheduled Meds: . finasteride  5 mg Oral Daily  . hydrALAZINE  50 mg Oral 3 times per day  . piperacillin-tazobactam (ZOSYN)  IV  3.375 g Intravenous 3 times per day  . sodium chloride  3 mL Intravenous Q12H  . tamsulosin  0.4 mg Oral Daily  . vancomycin  1,250 mg Intravenous Q24H  . warfarin  2 mg Oral Daily  . Warfarin - Physician Dosing Inpatient   Does not apply Daily   Continuous Infusions:

## 2015-03-25 ENCOUNTER — Other Ambulatory Visit: Payer: Self-pay | Admitting: Nurse Practitioner

## 2015-03-25 DIAGNOSIS — A4152 Sepsis due to Pseudomonas: Principal | ICD-10-CM

## 2015-03-25 DIAGNOSIS — C9000 Multiple myeloma not having achieved remission: Secondary | ICD-10-CM

## 2015-03-25 DIAGNOSIS — N1330 Unspecified hydronephrosis: Secondary | ICD-10-CM

## 2015-03-25 DIAGNOSIS — D6481 Anemia due to antineoplastic chemotherapy: Secondary | ICD-10-CM

## 2015-03-25 DIAGNOSIS — B965 Pseudomonas (aeruginosa) (mallei) (pseudomallei) as the cause of diseases classified elsewhere: Secondary | ICD-10-CM

## 2015-03-25 DIAGNOSIS — R7881 Bacteremia: Secondary | ICD-10-CM

## 2015-03-25 DIAGNOSIS — N390 Urinary tract infection, site not specified: Secondary | ICD-10-CM

## 2015-03-25 DIAGNOSIS — N2889 Other specified disorders of kidney and ureter: Secondary | ICD-10-CM

## 2015-03-25 DIAGNOSIS — I10 Essential (primary) hypertension: Secondary | ICD-10-CM

## 2015-03-25 DIAGNOSIS — N185 Chronic kidney disease, stage 5: Secondary | ICD-10-CM

## 2015-03-25 DIAGNOSIS — T451X5A Adverse effect of antineoplastic and immunosuppressive drugs, initial encounter: Secondary | ICD-10-CM

## 2015-03-25 DIAGNOSIS — B952 Enterococcus as the cause of diseases classified elsewhere: Secondary | ICD-10-CM

## 2015-03-25 DIAGNOSIS — N401 Enlarged prostate with lower urinary tract symptoms: Secondary | ICD-10-CM

## 2015-03-25 LAB — CBC
HCT: 24.1 % — ABNORMAL LOW (ref 39.0–52.0)
Hemoglobin: 8.1 g/dL — ABNORMAL LOW (ref 13.0–17.0)
MCH: 30.6 pg (ref 26.0–34.0)
MCHC: 33.6 g/dL (ref 30.0–36.0)
MCV: 90.9 fL (ref 78.0–100.0)
PLATELETS: 215 10*3/uL (ref 150–400)
RBC: 2.65 MIL/uL — AB (ref 4.22–5.81)
RDW: 17.2 % — ABNORMAL HIGH (ref 11.5–15.5)
WBC: 5.5 10*3/uL (ref 4.0–10.5)

## 2015-03-25 LAB — BASIC METABOLIC PANEL
Anion gap: 9 (ref 5–15)
BUN: 28 mg/dL — AB (ref 6–20)
CO2: 21 mmol/L — ABNORMAL LOW (ref 22–32)
CREATININE: 2.18 mg/dL — AB (ref 0.61–1.24)
Calcium: 8.9 mg/dL (ref 8.9–10.3)
Chloride: 108 mmol/L (ref 101–111)
GFR, EST AFRICAN AMERICAN: 34 mL/min — AB (ref >60)
GFR, EST NON AFRICAN AMERICAN: 30 mL/min — AB (ref >60)
Glucose, Bld: 106 mg/dL — ABNORMAL HIGH (ref 65–99)
POTASSIUM: 4.6 mmol/L (ref 3.5–5.1)
SODIUM: 138 mmol/L (ref 135–145)

## 2015-03-25 LAB — CULTURE, BLOOD (ROUTINE X 2)

## 2015-03-25 MED ORDER — CIPROFLOXACIN HCL 500 MG PO TABS: 500.0000 mg | ORAL_TABLET | Freq: Two times a day (BID) | ORAL | Status: DC

## 2015-03-25 MED ORDER — ACETAMINOPHEN 325 MG PO TABS: 650.0000 mg | ORAL_TABLET | ORAL | Status: AC | PRN

## 2015-03-25 MED ORDER — AMOXICILLIN-POT CLAVULANATE 875-125 MG PO TABS: 1.0000 | ORAL_TABLET | Freq: Two times a day (BID) | ORAL | Status: DC

## 2015-03-25 NOTE — Discharge Summary (Addendum)
Physician Discharge Summary  Wesley [name] DTO:[id number] DOB: 02-24-1946 DOA: 03/20/2015  PCP: Wesley Cowden, MD  Admit date: 03/20/2015 Discharge date: 03/25/2015  Recommendations for Outpatient Follow-up:  Continue Cipro and Augmentin for 5 more days on discharge. I spoke with oncology PA Wesley [name] who will f/u with Dr. Julien [name] of pt's new findings on CT head and f/u appt will be scheduled in their center.  Discharge Diagnoses:  Principal Problem:   Sepsis due to Pseudomonas UTI and bacteremia ([place]) Active Problems:   Myeloma kidney (HCC)   CKD (chronic kidney disease) stage 5, GFR less than 15 ml/min (HCC)   Kappa light chain myeloma (HCC)   Bilateral hydronephrosis   Antineoplastic chemotherapy induced anemia   Essential hypertension   UTI (urinary tract infection) due to Enterococcus and pseudomonas    Enlarged prostate with urinary obstruction   Discharge Condition: stable   Diet recommendation: as tolerated   History of present illness:  69 yo male with known history of multiple myeloma (follows with Dr Wesley [name]), radiation to the tumor mass at the right anterior chest under the care of Dr. [name] completed 12/07/2013 through 12/27/2013, was on systemic chemotherapy with Velcade, Revlimid and Decadron, seen in cancer center 01/31/2015 at which time decision made to hold off on tx due to worsening renal failure, recently discharged on 10/22 after being hospitalized for renal failure and bladder outlet obstruction/bilateral hydronephrosis.  Pt presented to [facility] ED with main concern for fevers up to 102 F,  malaise and poor oral intake.  Pt was found to have pseudomonas and enterococcus UTI and pseudomonas bacteremia.  Repeat blood cultures 11/19 showed no growth so far.  Hospital Course:   Assessment/Plan:    Principal Problem:  Sepsis ([place]), due to pseudomonas bacteremia and pseudomonas and enterococcus UTI - Sepsis criteria met on admission with HR > 90, RR >  22, T 102.56F. Now we know source is pseudomonas bacteremia and pseudomonas and enterococcus UTI - Per ID, will continue cipro and Augmentin for 5 more days on discharge. Pt was on zosynand vanco until through today. - Repeat blood cultures 11/19 showed no growth to date - No fevers overnight - WBC count WNL   Active Problems:  Kappa light chain myeloma (HCC) - New lytic lesions identified on the right side of the ribs - New pulmonary nodule also noted - Pt will follow up with oncology per scheduled appt    Bilateral hydronephrosis/Enlarged prostate with outlet obstruction  - Foley removed 03/24/2015. - No further recommendations per GU - Continue flomax and proscar   Head bump - Soft tissue mass and bony destruction seen on CT head but no acute findings. Oncology aware of this finding.    General weakness - Improved   CKD (chronic kidney disease) stage 5, GFR less than 15 ml/min (HCC) - Cr at baseline ~2   Antineoplastic chemotherapy induced anemia - Hemoglobin stable    Essential hypertension - Hydralazine per home regimen  DVT prophylaxis  - On coumadin 2 mg daily  - No reports of bleeding   Code Status: Full.  Family Communication: plan of care discussed with the patient   IV access:  Peripheral IV  Procedures and diagnostic studies:   Ct Abdomen Pelvis Wo Contrast 03/20/2015 New right lower lobe pulmonary nodule. 2. 5 cm left retroperitoneal mass at the level of the left renal vein, previously 2.6 cm. 3. Persistent moderate right hydronephrosis and ureterectasis. 4. Marked prostatic enlargement protruding into the lumen of the urinary bladder, with  wall thickening suggesting a degree of bladder outlet obstruction. 5. Stable right seventh rib lytic/sclerotic lesion. No new bone lesions identified.   Dg Chest 2 View 03/20/2015 Resorption of the right lateral seventh rib and associated right lateral chest wall mass have increased significantly in  size, now measuring 4.5 x 1.8 cm. Would correlate with prior biopsy results. 2. Lungs hypoexpanded, with elevation of the right hemidiaphragm. Borderline cardiomegaly.  US Renal 03/22/2015 Persistent right hydronephrosis despite decompression of the urinary bladder. This raises suspicion of obstructive changes at the ureterovesical junction on the right. Intraluminal mass within the bladder. Hypoechoic mass corresponding to the soft tissue lesion adjacent to the left kidney seen on recent CT.   Medical Consultants:  Urology  ID over the phone   Other Consultants:  PT  IAnti-Infectives:   Vanc and Zosyn 11/17 --> change to Cipro and Augmentin on discharge for 5 more days    Signed:  Leisa Lenz, MD  [facility] 03/25/2015, 10:10 AM  Pager #: [phone]  Time spent in minutes: more than 30 minutes    Discharge Exam: Filed Vitals:   03/24/15 2044 03/25/15 0525  BP: 127/70 146/73  Pulse: 95 103  Temp: 98.3 F (36.8 C) 98.2 F (36.8 C)  Resp: 18 18   Filed Vitals:   03/24/15 1521 03/24/15 2044 03/25/15 0500 03/25/15 0525  BP: 118/62 127/70  146/73  Pulse: 107 95  103  Temp: 97.8 F (36.6 C) 98.3 F (36.8 C)  98.2 F (36.8 C)  TempSrc: Oral Oral  Oral  Resp: $Remo'20 18  18  'yGjjt$ Height:      Weight:   88.587 kg (195 lb 4.8 oz)   SpO2: 100% 98%  100%    General: Pt is alert, follows commands appropriately, not in acute distress Cardiovascular: Regular rate and rhythm, S1/S2 + Respiratory: Clear to auscultation bilaterally, no wheezing, no crackles, no rhonchi Abdominal: Soft, non tender, non distended, bowel sounds +, no guarding Extremities: no edema, no cyanosis, pulses palpable bilaterally DP and PT Neuro: Grossly nonfocal  Discharge Instructions  Discharge Instructions    Call MD for:  difficulty breathing, headache or visual disturbances    Complete by:  As directed      Call MD for:  persistant dizziness or light-headedness    Complete by:  As  directed      Call MD for:  persistant nausea and vomiting    Complete by:  As directed      Call MD for:  severe uncontrolled pain    Complete by:  As directed      Diet - low sodium heart healthy    Complete by:  As directed      Discharge instructions    Complete by:  As directed   Continue Cipro and Augmentin for 5 more days on discharge.     Increase activity slowly    Complete by:  As directed             Medication List    TAKE these medications        acetaminophen 325 MG tablet  Commonly known as:  TYLENOL  Take 2 tablets (650 mg total) by mouth every 4 (four) hours as needed for mild pain, fever or headache.     amoxicillin-clavulanate 875-125 MG tablet  Commonly known as:  AUGMENTIN  Take 1 tablet by mouth 2 (two) times daily.     ciprofloxacin 500 MG tablet  Commonly known as:  CIPRO  Take  1 tablet (500 mg total) by mouth 2 (two) times daily.     finasteride 5 MG tablet  Commonly known as:  PROSCAR  Take 1 tablet (5 mg total) by mouth daily.     hydrALAZINE 50 MG tablet  Commonly known as:  APRESOLINE  Take 1 tablet (50 mg total) by mouth every 8 (eight) hours.     HYDROcodone-acetaminophen 5-325 MG tablet  Commonly known as:  NORCO/VICODIN  Take 1-2 tablets by mouth every 4 (four) hours as needed for moderate pain.     tamsulosin 0.4 MG Caps capsule  Commonly known as:  FLOMAX  Take 1 capsule (0.4 mg total) by mouth daily.     VELCADE IJ  Inject as directed. Infusion at [facility]     warfarin 2 MG tablet  Commonly known as:  COUMADIN  Take 1 tablet (2 mg total) by mouth daily.           Follow-up Information    Follow up with Wesley Cowden, MD. Schedule an appointment as soon as possible for a visit in 1 week.   Specialty:  Internal Medicine   Why:  Follow up appt after recent hospitalization   Contact information:   [address] [place] [address] [phone]        The results of significant diagnostics from  this hospitalization (including imaging, microbiology, ancillary and laboratory) are listed below for reference.    Significant Diagnostic Studies: Ct Abdomen Pelvis Wo Contrast  03/20/2015  CLINICAL DATA:  Fever, h/o bilat hydronephrosis, bladder tumor diagnosed 2015 EXAM: CT ABDOMEN AND PELVIS WITHOUT CONTRAST TECHNIQUE: Multidetector CT imaging of the abdomen and pelvis was performed following the standard protocol without IV contrast. COMPARISON:  02/17/2015 FINDINGS: 1 cm subpleural nodule in the posterolateral left lower lobe, stable. New 6 mm subpleural nodule in the posterolateral right lower lobe image 2/6, and and several proximal less sharply marginated nodular densities in the posterior basal segment right lower lobe. Mixed lytic/sclerotic lesion in the lateral aspect right seventh rib as before. Unremarkable liver, nondilated gallbladder, spleen, adrenal glands, pancreas. Unenhanced CT was performed per clinician order. Lack of IV contrast limits sensitivity and specificity, especially for evaluation of abdominal/pelvic solid viscera. Moderate right hydronephrosis and ureterectasis down to the ureteral orifice as before. The left hydronephrosis has improved. Left renal parenchymal atrophy. 5 cm mass medial to the left kidney abutting left renal vein, previously 2.6 cm. Stomach is decompressed. Small bowel nondilated. Normal appendix. Moderate proximal colonic fecal material, decompressed distally. Distended urinary bladder with diffuse wall thickening. The mastoid there is massive prostatic enlargement, protruding into the lumen of the urinary bladder in a lobular fashion. Bilateral pelvic phleboliths. Patchy iliac arterial calcifications. No ascites. No free air. No adenopathy localized. Facet DJD in the lower lumbar spine. IMPRESSION: 1. New right lower lobe pulmonary nodule. 2. 5 cm left retroperitoneal mass at the level of the left renal vein, previously 2.6 cm. 3. Persistent moderate right  hydronephrosis and ureterectasis. 4. Marked prostatic enlargement protruding into the lumen of the urinary bladder, with wall thickening suggesting a degree of bladder outlet obstruction. 5. Stable right seventh rib lytic/sclerotic lesion. No new bone lesions identified. Electronically Signed   By: Lucrezia Europe M.D.   On: 03/20/2015 17:17   Dg Chest 2 View  03/20/2015  CLINICAL DATA:  Acute onset of fever. Current history of chest wall mass, status post biopsy. Initial encounter. EXAM: CHEST  2 VIEW COMPARISON:  Chest radiograph performed 02/17/2015 FINDINGS:  Resorption of the right lateral seventh rib and associated right lateral chest wall mass have increased significantly in size, now measuring 4.5 x 1.8 cm. Would correlate with prior biopsy results. The lungs are hypoexpanded, with elevation of the right hemidiaphragm. No pleural effusion or pneumothorax is seen. The heart is borderline enlarged. No acute osseous abnormalities are identified. Scattered clips are noted overlying the left axilla. IMPRESSION: 1. Resorption of the right lateral seventh rib and associated right lateral chest wall mass have increased significantly in size, now measuring 4.5 x 1.8 cm. Would correlate with prior biopsy results. 2. Lungs hypoexpanded, with elevation of the right hemidiaphragm. Borderline cardiomegaly. Electronically Signed   By: Garald Balding M.D.   On: 03/20/2015 18:47   Ct Head Wo Contrast  03/24/2015  CLINICAL DATA:  Multiple myeloma.  Skull lesion on exam. EXAM: CT HEAD WITHOUT CONTRAST TECHNIQUE: Contiguous axial images were obtained from the base of the skull through the vertex without intravenous contrast. COMPARISON:  None. FINDINGS: Mild atrophy. Negative for hydrocephalus. Negative for acute or chronic infarction. Negative for intracranial hemorrhage. Destructive lesion in the right frontal bone. Soft tissue mass extends into the scalp as well as posteriorly through the inner table. Margins are  ill-defined and this is most compatible with neoplasm. There is a similar destructive lesion in the frontal bone involving the frontal sinus in the midline. There is associated hyperdense soft tissue mass extending into the anterior subarachnoid space and into the frontal sinus. There is bony destruction with ill-defined margins. There is low-density in the frontal sinus which is most likely a mucocele adjacent to the mass lesion. No other skull lesions. IMPRESSION: Right frontal skull lesion with bony destruction. There is a second lesion involving the frontal sinus with soft tissue mass and bony destruction. Findings are most consistent with metastatic disease or myeloma. There is a probable mucocele in the frontal sinus. No acute abnormality of the brain. Electronically Signed   By: Franchot Gallo M.D.   On: 03/24/2015 15:31   US Renal  03/22/2015  CLINICAL DATA:  Right-sided hydronephrosis on recent CT examination EXAM: RENAL / URINARY TRACT ULTRASOUND COMPLETE COMPARISON:  03/20/2015 FINDINGS: Right Kidney: Length: 11.8 cm.  Moderate hydronephrosis is noted. Left Kidney: Length: 9.4 cm. A hypoechoic mass lesion measuring 4.7 cm in greatest dimension is noted adjacent to the left kidney corresponding to that seen on the prior CT examination. Bladder: Decompressed by Foley catheter. Large soft tissue mass lesion is noted within similar to that noted on prior CT. IMPRESSION: Persistent right hydronephrosis despite decompression of the urinary bladder. This raises suspicion of obstructive changes at the ureterovesical junction on the right. Intraluminal mass within the bladder. Hypoechoic mass corresponding to the soft tissue lesion adjacent to the left kidney seen on recent CT. Electronically Signed   By: Inez Catalina M.D.   On: 03/22/2015 11:09    Microbiology: Blood culture (routine x 2)     Status: None (Preliminary result)   Collection Time: 03/20/15  4:40 PM  Result Value Ref Range Status    Specimen Description BLOOD RIGHT ANTECUBITAL  Final   Special Requests BOTTLES DRAWN AEROBIC AND ANAEROBIC 5CC  Final   Culture  Setup Time   Final   Culture   Final    STAPHYLOCOCCUS SPECIES (COAGULASE NEGATIVE)    Report Status PENDING  Incomplete  Blood culture (routine x 2)     Status: None   Collection Time: 03/20/15  5:30 PM  Result Value Ref Range  Status   Specimen Description BLOOD RIGHT HAND  Final   Special Requests BOTTLES DRAWN AEROBIC AND ANAEROBIC 5CC  Final   Culture  Setup Time   Final   Culture   Final   Report Status 03/24/2015 FINAL  Final   Organism ID, Bacteria PSEUDOMONAS AERUGINOSA  Final      Susceptibility   Pseudomonas aeruginosa - MIC*    CEFTAZIDIME 4 SENSITIVE Sensitive     CIPROFLOXACIN <=0.25 SENSITIVE Sensitive     GENTAMICIN 4 SENSITIVE Sensitive     PIP/TAZO 8 SENSITIVE Sensitive     CEFEPIME 2 SENSITIVE Sensitive     * PSEUDOMONAS AERUGINOSA  Urine culture     Status: None   Collection Time: 03/20/15  5:33 PM  Result Value Ref Range Status   Specimen Description URINE, CATHETERIZED  Final   Special Requests Immunocompromis  Final   Culture   Final   Report Status 03/24/2015 FINAL  Final   Organism ID, Bacteria ENTEROCOCCUS FAECALIS  Final   Organism ID, Bacteria PSEUDOMONAS AERUGINOSA  Final      Susceptibility   Pseudomonas aeruginosa - MIC*    CEFTAZIDIME 4 SENSITIVE Sensitive     CIPROFLOXACIN <=0.25 SENSITIVE Sensitive     GENTAMICIN 8 INTERMEDIATE Intermediate     PIP/TAZO 8 SENSITIVE Sensitive     CEFEPIME 2 SENSITIVE Sensitive     * >=100,000 COLONIES/mL PSEUDOMONAS AERUGINOSA   Enterococcus faecalis - MIC*    AMPICILLIN <=2 SENSITIVE Sensitive     LEVOFLOXACIN 0.25 SENSITIVE Sensitive     NITROFURANTOIN <=16 SENSITIVE Sensitive     VANCOMYCIN 1 SENSITIVE Sensitive     * 80,000 COLONIES/ml ENTEROCOCCUS FAECALIS  Culture, blood (routine x 2)     Status: None (Preliminary result)   Collection Time: 03/23/15 11:00 AM  Result  Value Ref Range Status   Specimen Description BLOOD LEFT ANTECUBITAL  Final   Special Requests BOTTLES DRAWN AEROBIC AND ANAEROBIC 10CC  Final   Culture   Final    NO GROWTH < 24 HOURS Performed at [facility]    Report Status PENDING  Incomplete  Culture, blood (routine x 2)     Status: None (Preliminary result)   Collection Time: 03/23/15 11:00 AM  Result Value Ref Range Status   Specimen Description BLOOD RIGHT HAND  Final   Special Requests BOTTLES DRAWN AEROBIC AND ANAEROBIC 10CC  Final   Culture   Final    NO GROWTH < 24 HOURS Performed at [facility]    Report Status PENDING  Incomplete     Labs: Basic Metabolic Panel:  Recent Labs Lab 03/20/15 1640 03/21/15 0427 03/23/15 0510 03/25/15 0432  NA 135 138 140 138  K 4.3 4.7 4.6 4.6  CL 106 111 109 108  CO2 20* 20* 21* 21*  GLUCOSE 121* 119* 112* 106*  BUN 30* 27* 26* 28*  CREATININE 2.03* 2.13* 2.19* 2.18*  CALCIUM 9.0 8.8* 9.1 8.9   Liver Function Tests:  Recent Labs Lab 03/20/15 1640  AST 32  ALT 26  ALKPHOS 65  BILITOT 1.0  PROT 7.4  ALBUMIN 3.8   No results for input(s): LIPASE, AMYLASE in the last 168 hours. No results for input(s): AMMONIA in the last 168 hours. CBC:  Recent Labs Lab 03/20/15 1640 03/21/15 0427 03/23/15 0510 03/25/15 0432  WBC 11.2* 9.6 4.9 5.5  NEUTROABS 8.9*  --   --   --   HGB 8.5* 8.3* 8.1* 8.1*  HCT 25.1* 25.2*  23.8* 24.1*  MCV 91.3 93.0 91.2 90.9  PLT 201 197 195 215   Cardiac Enzymes: No results for input(s): CKTOTAL, CKMB, CKMBINDEX, TROPONINI in the last 168 hours. BNP: BNP (last 3 results) No results for input(s): BNP in the last 8760 hours.  ProBNP (last 3 results) No results for input(s): PROBNP in the last 8760 hours.  CBG: No results for input(s): GLUCAP in the last 168 hours.

## 2015-03-25 NOTE — Progress Notes (Signed)
  Pt without complaints. Dressed and walking around room. Ready to go home. Cr stable today. He reports he is voiding well with a good flow. No incontinence.   A/P - UTI (urinary tract infection) due to Enterococcus and pseudomonas and pseudomonas bacteremia - Per ID, will continue cipro and Augmentin for 5 more days on discharge. BPH, incomplete bladder emptying, hydronephrosis - will have pt seen for a PVR, kidney fxn in 1 week, my office.

## 2015-03-25 NOTE — Discharge Instructions (Signed)
Amoxicillin; Clavulanic Acid extended-release tablets °What is this medicine? °AMOXICILLIN; CLAVULANIC ACID (a mox i SILL in; KLAV yoo lan ic AS id) is a penicillin antibiotic. It is used to treat certain kinds of bacterial infections. It will not work for colds, flu, or other viral infections. °This medicine may be used for other purposes; ask your health care provider or pharmacist if you have questions. °What should I tell my health care provider before I take this medicine? °They need to know if you have any of these conditions: °-bowel disease, like colitis °-kidney disease °-liver disease °-mononucleosis °-an unusual or allergic reaction to amoxicillin, penicillin, cephalosporin, other antibiotics, clavulanic acid, other medicines, foods, dyes, or preservatives °-pregnant or trying to get pregnant °-breast-feeding °How should I use this medicine? °Take this medicine by mouth with a full glass of water. Follow the directions on the prescription label. Take at the start of a meal. Do not crush or chew. You may cut this medicine in half at the score line for easier swallowing. Take your medicine at regular intervals. Do not take your medicine more often than directed. Take all of your medicine as directed even if you think you are better. Do not skip doses or stop your medicine early. °Contact your pediatrician or health care professional regarding the use of this medicine in children. This medicine has been used in children as young as 16 years of age. °Overdosage: If you think you have taken too much of this medicine contact a poison control center or emergency room at once. °NOTE: This medicine is only for you. Do not share this medicine with others. °What if I miss a dose? °If you miss a dose, take it as soon as you can. If it is almost time for your next dose, take only that dose. Do not take double or extra doses. °What may interact with this medicine? °-allopurinol °-anticoagulants °-birth control  pills °-methotrexate °-probenecid °This list may not describe all possible interactions. Give your health care provider a list of all the medicines, herbs, non-prescription drugs, or dietary supplements you use. Also tell them if you smoke, drink alcohol, or use illegal drugs. Some items may interact with your medicine. °What should I watch for while using this medicine? °Tell your doctor or health care professional if your symptoms do not improve. °Do not treat diarrhea with over the counter products. Contact your doctor if you have diarrhea that lasts more than 2 days or if it is severe and watery. °If you have diabetes, you may get a false-positive result for sugar in your urine. Check with your doctor or health care professional. °Birth control pills may not work properly while you are taking this medicine. Talk to your doctor about using an extra method of birth control. °What side effects may I notice from receiving this medicine? °Side effects that you should report to your doctor or health care professional as soon as possible: °-allergic reactions like skin rash, itching or hives, swelling of the face, lips, or tongue °-breathing problems °-dark urine °-fever or chills, sore throat °-redness, blistering, peeling or loosening of the skin, including inside the mouth °-seizures °-trouble passing urine or change in the amount of urine °-unusual bleeding, bruising °-unusually weak or tired °-white patches or sores in the mouth or throat °Side effects that usually do not require medical attention (report to your doctor or health care professional if they continue or are bothersome): °-diarrhea °-dizziness °-headache °-nausea, vomiting °-stomach upset °-vaginal or anal irritation °  This list may not describe all possible side effects. Call your doctor for medical advice about side effects. You may report side effects to FDA at 1-800-FDA-1088. Where should I keep my medicine? Keep out of the reach of  children. Store at room temperature below 25 degrees C (77 degrees F). Keep container tightly closed. Throw away any unused medicine after the expiration date. NOTE: This sheet is a summary. It may not cover all possible information. If you have questions about this medicine, talk to your doctor, pharmacist, or health care provider.    2016, [employer]. (2007-07-12 14:32:45) Ciprofloxacin extended-release tablets What is this medicine? CIPROFLOXACIN (sip roe FLOX a sin) is a quinolone antibiotic. It is used to treat certain kinds of bacterial infections. It will not work for colds, flu, or other viral infections. This medicine may be used for other purposes; ask your health care provider or pharmacist if you have questions. What should I tell my health care provider before I take this medicine? They need to know if you have any of these conditions: -bone problems -cerebral disease -history of low potassium levels in the blood -irregular heartbeat -joint problems -kidney disease -myasthenia gravis -seizures -tendon problems -tingling of the fingers or toes, or other nerve disorder -an unusual or allergic reaction to ciprofloxacin, other antibiotics or medicines, foods, dyes, or preservatives -pregnant or trying to get pregnant -breast-feeding How should I use this medicine? Take this medicine by mouth with a full glass of water. Follow the directions on the prescription label. Do not split, crush, or chew the tablet. Take your medicine at regular intervals. Do not take your medicine more often than directed. Take all of your medicine as directed even if you think your are better. Do not skip doses or stop your medicine early. You can take this medicine with food or on an empty stomach. It can be taken with a meal that contains dairy or calcium, but do not take it alone with a dairy product, like milk or yogurt, or calcium-fortified juice. A special MedGuide will be given to you  by the pharmacist with each prescription and refill. Be sure to read this information carefully each time. Talk to your pediatrician regarding the use of this medicine in children. Special care may be needed. Overdosage: If you think you have taken too much of this medicine contact a poison control center or emergency room at once. NOTE: This medicine is only for you. Do not share this medicine with others. What if I miss a dose? If you miss a dose, take it as soon as you can. If it is almost time for your next dose, take only that dose. Do not take double or extra doses. Do not take more than one dose in a day. What may interact with this medicine? Do not take this medicine with any of the following medications: -cisapride -droperidol -terfenadine -tizanidine This medicine may also interact with the following medications: -antacids -birth control pills -caffeine -cyclosporin -didanosine (ddI) buffered tablets or powder -medicines for diabetes -medicines for inflammation like ibuprofen, naproxen -methotrexate -multivitamins -omeprazole -phenytoin -probenecid -sucralfate -theophylline -warfarin This list may not describe all possible interactions. Give your health care provider a list of all the medicines, herbs, non-prescription drugs, or dietary supplements you use. Also tell them if you smoke, drink alcohol, or use illegal drugs. Some items may interact with your medicine. What should I watch for while using this medicine? Tell your doctor or health care professional if your symptoms  do not improve. Do not treat diarrhea with over the counter products. Contact your doctor if you have diarrhea that lasts more than 2 days or if it is severe and watery. You may get drowsy or dizzy. Do not drive, use machinery, or do anything that needs mental alertness until you know how this medicine affects you. Do not stand or sit up quickly, especially if you are an older patient. This reduces the  risk of dizzy or fainting spells. This medicine can make you more sensitive to the sun. Keep out of the sun. If you cannot avoid being in the sun, wear protective clothing and use sunscreen. Do not use sun lamps or tanning beds/booths. Avoid antacids, aluminum, calcium, iron, magnesium, and zinc products for 6 hours before and 2 hours after taking a dose of this medicine. What side effects may I notice from receiving this medicine? Side effects that you should report to your doctor or health care professional as soon as possible: -allergic reactions like skin rash or hives, swelling of the face, lips, or tongue -anxious -confusion -depressed mood -diarrhea -fast, irregular heartbeat -hallucination, loss of contact with reality -joint, muscle, or tendon pain or swelling -pain, tingling, numbness in the hands or feet -suicidal thoughts or other mood changes -sunburn -unusually weak or tired Side effects that usually do not require medical attention (report to your doctor or health care professional if they continue or are bothersome): -dry mouth -headache -nausea -trouble sleeping This list may not describe all possible side effects. Call your doctor for medical advice about side effects. You may report side effects to FDA at 1-800-FDA-1088. Where should I keep my medicine? Keep out of the reach of children. Store at room temperature between 15 to 30 degrees C (59 to 86 degrees F). Keep container tightly closed. Throw away any unused medicine after the expiration date. NOTE: This sheet is a summary. It may not cover all possible information. If you have questions about this medicine, talk to your doctor, pharmacist, or health care provider.    2016, [employer]. (2014-11-29 12:49:29)

## 2015-03-27 ENCOUNTER — Telehealth: Payer: Self-pay | Admitting: *Deleted

## 2015-03-27 NOTE — Telephone Encounter (Signed)
Patient was recently discharged from the hospital.  The wife is concerned about a "bump" on the patient's forehead.  She states it was there before he was admitted to the hospital, about the size of a quarter, at his hairline, but no complaints of pain.  She states that it was looked at while the patient was in the hospital.    Discharge instructions state patient should be see ASAP 1 week after discharged.  Please advise as to where to schedule the patient.

## 2015-03-27 NOTE — Telephone Encounter (Signed)
Transition Care Management Follow-up Telephone Call  How have you been since you were released from the hospital? Doing okay moving around   Do you understand why you were in the hospital? yes   Do you understand the discharge instrcutions? yes  Items Reviewed:  Medications reviewed: yes  Allergies reviewed: yes  Dietary changes reviewed: yes  Referrals reviewed: yes   Functional Questionnaire:   Activities of Daily Living (ADLs):   He states they are independent in the following: ambulation, bathing and hygiene, feeding, continence, grooming, toileting and dressing States they require assistance with the following: none   Any transportation issues/concerns?: no   Any patient concerns? Yes - he has a knot on the top of his forehead   Confirmed importance and date/time of follow-up visits scheduled:    Confirmed with patient if condition begins to worsen call PCP or go to the ER.  Patient was given the Call-a-Nurse line [phone]

## 2015-03-27 NOTE — Telephone Encounter (Signed)
Left message on machine for patient to return our call.  1st attempt.  Transitional care management.

## 2015-03-28 LAB — CULTURE, BLOOD (ROUTINE X 2)
Culture: NO GROWTH
Culture: NO GROWTH

## 2015-03-29 ENCOUNTER — Telehealth: Payer: Self-pay | Admitting: Internal Medicine

## 2015-03-29 NOTE — Telephone Encounter (Signed)
Spoke with wife about follow up appt

## 2015-04-01 NOTE — Telephone Encounter (Signed)
Please call pt and schedule this week per Dr. Raliegh Ip. Thanks

## 2015-04-01 NOTE — Telephone Encounter (Signed)
Spoke with pt and sch for 11:45am on Wed

## 2015-04-01 NOTE — Telephone Encounter (Signed)
Patient was discharged to home  Patient was discharged 03/25/15 Patient has an appointment with Dr Raliegh Ip 04/03/15

## 2015-04-01 NOTE — Telephone Encounter (Signed)
rov this week

## 2015-04-02 ENCOUNTER — Telehealth: Payer: Self-pay | Admitting: Internal Medicine

## 2015-04-02 ENCOUNTER — Ambulatory Visit (HOSPITAL_BASED_OUTPATIENT_CLINIC_OR_DEPARTMENT_OTHER): Payer: Medicare HMO | Admitting: Internal Medicine

## 2015-04-02 ENCOUNTER — Other Ambulatory Visit (HOSPITAL_BASED_OUTPATIENT_CLINIC_OR_DEPARTMENT_OTHER): Payer: Medicare HMO

## 2015-04-02 ENCOUNTER — Telehealth: Payer: Self-pay | Admitting: *Deleted

## 2015-04-02 ENCOUNTER — Encounter: Payer: Self-pay | Admitting: Internal Medicine

## 2015-04-02 VITALS — BP 164/99 | HR 97 | Temp 98.6°F | Resp 18 | Ht 68.0 in | Wt 201.5 lb

## 2015-04-02 DIAGNOSIS — N2889 Other specified disorders of kidney and ureter: Secondary | ICD-10-CM | POA: Diagnosis not present

## 2015-04-02 DIAGNOSIS — N08 Glomerular disorders in diseases classified elsewhere: Secondary | ICD-10-CM

## 2015-04-02 DIAGNOSIS — C9000 Multiple myeloma not having achieved remission: Secondary | ICD-10-CM

## 2015-04-02 LAB — COMPREHENSIVE METABOLIC PANEL (CC13)
ALBUMIN: 3.5 g/dL (ref 3.5–5.0)
ALK PHOS: 66 U/L (ref 40–150)
ALT: 11 U/L (ref 0–55)
ANION GAP: 7 meq/L (ref 3–11)
AST: 15 U/L (ref 5–34)
BILIRUBIN TOTAL: 0.61 mg/dL (ref 0.20–1.20)
BUN: 17.6 mg/dL (ref 7.0–26.0)
CALCIUM: 9.4 mg/dL (ref 8.4–10.4)
CO2: 21 mEq/L — ABNORMAL LOW (ref 22–29)
Chloride: 109 mEq/L (ref 98–109)
Creatinine: 2.1 mg/dL — ABNORMAL HIGH (ref 0.7–1.3)
EGFR: 37 mL/min/{1.73_m2} — AB (ref >90)
GLUCOSE: 93 mg/dL (ref 70–140)
POTASSIUM: 4 meq/L (ref 3.5–5.1)
Sodium: 138 mEq/L (ref 136–145)
TOTAL PROTEIN: 7.6 g/dL (ref 6.4–8.3)

## 2015-04-02 LAB — CBC WITH DIFFERENTIAL/PLATELET
BASO%: 0.5 % (ref 0.0–2.0)
Basophils Absolute: 0 10*3/uL (ref 0.0–0.1)
EOS%: 3.4 % (ref 0.0–7.0)
Eosinophils Absolute: 0.1 10*3/uL (ref 0.0–0.5)
HCT: 24.3 % — ABNORMAL LOW (ref 38.4–49.9)
HEMOGLOBIN: 8.1 g/dL — AB (ref 13.0–17.1)
LYMPH%: 17.2 % (ref 14.0–49.0)
MCH: 30.2 pg (ref 27.2–33.4)
MCHC: 33.3 g/dL (ref 32.0–36.0)
MCV: 90.7 fL (ref 79.3–98.0)
MONO#: 0.6 10*3/uL (ref 0.1–0.9)
MONO%: 14.1 % — AB (ref 0.0–14.0)
NEUT%: 64.8 % (ref 39.0–75.0)
NEUTROS ABS: 2.7 10*3/uL (ref 1.5–6.5)
Platelets: 189 10*3/uL (ref 140–400)
RBC: 2.68 10*6/uL — ABNORMAL LOW (ref 4.20–5.82)
RDW: 18.2 % — AB (ref 11.0–14.6)
WBC: 4.1 10*3/uL (ref 4.0–10.3)
lymph#: 0.7 10*3/uL — ABNORMAL LOW (ref 0.9–3.3)

## 2015-04-02 LAB — URINALYSIS, MICROSCOPIC - CHCC
Bilirubin (Urine): NEGATIVE
Blood: NEGATIVE
GLUCOSE UR CHCC: NEGATIVE mg/dL
Ketones: NEGATIVE mg/dL
NITRITE: NEGATIVE
PROTEIN: NEGATIVE mg/dL
RBC / HPF: NEGATIVE (ref 0–2)
SPECIFIC GRAVITY, URINE: 1.005 (ref 1.003–1.035)
UROBILINOGEN UR: 0.2 mg/dL (ref 0.2–1)
pH: 6 (ref 4.6–8.0)

## 2015-04-02 LAB — PROTIME-INR
INR: 1 — AB (ref 2.00–3.50)
Protime: 12 Seconds (ref 10.6–13.4)

## 2015-04-02 LAB — HOLD TUBE, BLOOD BANK

## 2015-04-02 MED ORDER — DEXAMETHASONE 4 MG PO TABS: ORAL_TABLET | ORAL | Status: DC

## 2015-04-02 NOTE — Telephone Encounter (Signed)
per pof to sch pt appt-gave pt copy of avs-sent MW email to sch trmt-willc all pt after reply °

## 2015-04-02 NOTE — Progress Notes (Signed)
[place] [phone]   [phone]  OFFICE PROGRESS NOTE  [name], MD [place] [address]  DIAGNOSIS:  Multiple myeloma diagnosed in May 2016  PRIOR THERAPY:  1) Curative radiotherapy to the tumor mass at the right anterior chest under the care of Dr. [name] completed in 12/27/2013. 2) Systemic chemotherapy with weekly subcutaneous Velcade 1.3 MG/M2, Revlimid 25 mg by mouth daily for 21 days every 4 weeks in addition to weekly Decadron 40 mg orally. Last dose of Velcade was giving 03/08/2015. This treatment was discontinued secondary to disease progression.  CURRENT THERAPY: Carfilzomib 20 MG/M2 initially first week followed by 36 MG/M2 on days 1, 2, 8, 9, 15 and 16 every 4 weeks in addition to Cytoxan 300 MG/M2 weekly as well as Decadron 40 mg on a weekly basis. First dose scheduled on 04/08/2015.  INTERVAL HISTORY: Wesley [name] 69 y.o. male returns to the clinic today for follow-up visit accompanied by his wife. The patient is feeling fine today with no specific complaints. He was found on several imaging studies recently to have evidence for disease progression with new lytic lesions in the skull as well as enlarging left retroperitoneal mass at the level of the left renal vein that measured 5.0 cm compared to 2.6 cm on the previous scan. The patient denied having any significant weakness. He denied having any chest pain, shortness of breath, cough or hemoptysis. He has no nausea or vomiting. He has enlarging skull lesion on the frontal area. He is here today for evaluation and recommendation regarding his condition.  MEDICAL HISTORY: Past Medical History  Diagnosis Date  . Pulmonary nodule, right     W/  CHEST WALL MASS--  SCHEDULED FOR BIOPSY W/ DR [name] ON FRIDAY 10-27-2013  . History of seizures as a child     AGE 53 OR 8--- NONE ISSUE SINCE  . History of GI bleed     SECONDARY TO ULCER --  DEC  2011  RESOLVED PER LAST EGD 07-05-2010  . Bladder tumor   . Hematuria   . Radiation 12/07/13-12/27/13    right anterior chest 37.5 gray  . Fracture of radius 09/29/14    left  . Cancer ([place])     multiple myeloma, bladder cancer 2015    ALLERGIES:  has No Known Allergies.  MEDICATIONS:  Current Outpatient Prescriptions  Medication Sig Dispense Refill  . acetaminophen (TYLENOL) 325 MG tablet Take 2 tablets (650 mg total) by mouth every 4 (four) hours as needed for mild pain, fever or headache. 30 tablet 0  . amoxicillin-clavulanate (AUGMENTIN) 875-125 MG tablet Take 1 tablet by mouth 2 (two) times daily. 10 tablet 0  . Bortezomib (VELCADE IJ) Inject as directed. Infusion at [facility]    . ciprofloxacin (CIPRO) 500 MG tablet Take 1 tablet (500 mg total) by mouth 2 (two) times daily. 10 tablet 0  . finasteride (PROSCAR) 5 MG tablet Take 1 tablet (5 mg total) by mouth daily. 30 tablet 1  . hydrALAZINE (APRESOLINE) 50 MG tablet Take 1 tablet (50 mg total) by mouth every 8 (eight) hours. 90 tablet 1  . HYDROcodone-acetaminophen (NORCO/VICODIN) 5-325 MG tablet Take 1-2 tablets by mouth every 4 (four) hours as needed for moderate pain. 65 tablet 0  . tamsulosin (FLOMAX) 0.4 MG CAPS capsule Take 1 capsule (0.4 mg total) by mouth daily. 30 capsule 1  . warfarin (COUMADIN) 2 MG tablet Take 1 tablet (2 mg total) by  mouth daily. 30 tablet 1   No current facility-administered medications for this visit.    SURGICAL HISTORY:  Past Surgical History  Procedure Laterality Date  . Excision benign left axilla tumor  1993  . Esophagogastroduodenoscopy  X5   LAST ONE 07-05-2010  . Transurethral resection of bladder tumor with gyrus (turbt-gyrus) N/A 10/25/2013    Procedure: TRANSURETHRAL RESECTION OF BLADDER TUMOR WITH GYRUS (TURBT-GYRUS);  Surgeon: Sharyn Creamer, MD;  Location: [facility];  Service: Urology;  Laterality: N/A;  . Cystoscopy with biopsy N/A 10/25/2013    Procedure: CYSTOSCOPY;   Surgeon: Sharyn Creamer, MD;  Location: [facility];  Service: Urology;  Laterality: N/A;  . Bone biopsy Right 11/14/13     right seventh rib lesion  . Orif radial fracture Left 10/22/2014    Procedure: Intramedullary nailing left radius fracture;  Surgeon: Milly Jakob, MD;  Location: [facility];  Service: Orthopedics;  Laterality: Left;  OPEN TREATMENT LEFT RADIUS FRACTURE  . Cystoscopy with retrograde pyelogram, ureteroscopy and stent placement N/A 02/21/2015    Procedure: CYSTOSCOPY, EXAM UNDER ANESTHESIA, FOLEY CATHETER PLACEMENT;  Surgeon: Festus Aloe, MD;  Location: [facility];  Service: Urology;  Laterality: N/A;    REVIEW OF SYSTEMS:  Constitutional: positive for fatigue Eyes: negative Ears, nose, mouth, throat, and face: negative Respiratory: negative Cardiovascular: negative Gastrointestinal: negative Genitourinary:negative Integument/breast: negative Hematologic/lymphatic: negative Musculoskeletal:positive for bone pain Neurological: negative Behavioral/Psych: negative Endocrine: negative Allergic/Immunologic: negative   PHYSICAL EXAMINATION: General appearance: alert, cooperative and no distress Head: Normocephalic, without obvious abnormality, atraumatic Neck: no adenopathy, no carotid bruit, supple, symmetrical, trachea midline and thyroid not enlarged, symmetric, no tenderness/mass/nodules Lymph nodes: Cervical, supraclavicular, and axillary nodes normal. Resp: clear to auscultation bilaterally Back: symmetric, no curvature. ROM normal. No CVA tenderness. Cardio: regular rate and rhythm, S1, S2 normal, no murmur, click, rub or gallop GI: soft, non-tender; bowel sounds normal; no masses,  no organomegaly Extremities: extremities normal, atraumatic, no cyanosis or edema Neurologic: Alert and oriented X 3, normal strength and tone. Normal symmetric reflexes. Normal coordination and gait  ECOG PERFORMANCE STATUS: 1 - Symptomatic but  completely ambulatory  Blood pressure 164/99, pulse 97, temperature 98.6 F (37 C), temperature source Oral, resp. rate 18, height $RemoveBe'5\' 8"'THoMUzUdR$  (1.727 m), weight 201 lb 8 oz (91.4 kg), SpO2 100 %.  LABORATORY DATA: Lab Results  Component Value Date   WBC 4.1 04/02/2015   HGB 8.1* 04/02/2015   HCT 24.3* 04/02/2015   MCV 90.7 04/02/2015   PLT 189 04/02/2015      Chemistry      Component Value Date/Time   NA 138 03/25/2015 0432   NA 138 03/14/2015 1351   K 4.6 03/25/2015 0432   K 4.6 03/14/2015 1351   CL 108 03/25/2015 0432   CO2 21* 03/25/2015 0432   CO2 18* 03/14/2015 1351   BUN 28* 03/25/2015 0432   BUN 24.0 03/14/2015 1351   CREATININE 2.18* 03/25/2015 0432   CREATININE 2.0* 03/14/2015 1351      Component Value Date/Time   CALCIUM 8.9 03/25/2015 0432   CALCIUM 9.4 03/14/2015 1351   ALKPHOS 65 03/20/2015 1640   ALKPHOS 72 03/14/2015 1351   AST 32 03/20/2015 1640   AST 11 03/14/2015 1351   ALT 26 03/20/2015 1640   ALT 12 03/14/2015 1351   BILITOT 1.0 03/20/2015 1640   BILITOT 0.65 03/14/2015 1351       RADIOGRAPHIC STUDIES: Ct Abdomen Pelvis Wo Contrast  03/20/2015  CLINICAL DATA:  Fever, h/o bilat hydronephrosis, bladder tumor diagnosed 2015 EXAM: CT ABDOMEN AND PELVIS WITHOUT CONTRAST TECHNIQUE: Multidetector CT imaging of the abdomen and pelvis was performed following the standard protocol without IV contrast. COMPARISON:  02/17/2015 FINDINGS: 1 cm subpleural nodule in the posterolateral left lower lobe, stable. New 6 mm subpleural nodule in the posterolateral right lower lobe image 2/6, and and several proximal less sharply marginated nodular densities in the posterior basal segment right lower lobe. Mixed lytic/sclerotic lesion in the lateral aspect right seventh rib as before. Unremarkable liver, nondilated gallbladder, spleen, adrenal glands, pancreas. Unenhanced CT was performed per clinician order. Lack of IV contrast limits sensitivity and specificity, especially for  evaluation of abdominal/pelvic solid viscera. Moderate right hydronephrosis and ureterectasis down to the ureteral orifice as before. The left hydronephrosis has improved. Left renal parenchymal atrophy. 5 cm mass medial to the left kidney abutting left renal vein, previously 2.6 cm. Stomach is decompressed. Small bowel nondilated. Normal appendix. Moderate proximal colonic fecal material, decompressed distally. Distended urinary bladder with diffuse wall thickening. The mastoid there is massive prostatic enlargement, protruding into the lumen of the urinary bladder in a lobular fashion. Bilateral pelvic phleboliths. Patchy iliac arterial calcifications. No ascites. No free air. No adenopathy localized. Facet DJD in the lower lumbar spine. IMPRESSION: 1. New right lower lobe pulmonary nodule. 2. 5 cm left retroperitoneal mass at the level of the left renal vein, previously 2.6 cm. 3. Persistent moderate right hydronephrosis and ureterectasis. 4. Marked prostatic enlargement protruding into the lumen of the urinary bladder, with wall thickening suggesting a degree of bladder outlet obstruction. 5. Stable right seventh rib lytic/sclerotic lesion. No new bone lesions identified. Electronically Signed   By: Lucrezia Europe M.D.   On: 03/20/2015 17:17   Dg Chest 2 View  03/20/2015  CLINICAL DATA:  Acute onset of fever. Current history of chest wall mass, status post biopsy. Initial encounter. EXAM: CHEST  2 VIEW COMPARISON:  Chest radiograph performed 02/17/2015 FINDINGS: Resorption of the right lateral seventh rib and associated right lateral chest wall mass have increased significantly in size, now measuring 4.5 x 1.8 cm. Would correlate with prior biopsy results. The lungs are hypoexpanded, with elevation of the right hemidiaphragm. No pleural effusion or pneumothorax is seen. The heart is borderline enlarged. No acute osseous abnormalities are identified. Scattered clips are noted overlying the left axilla.  IMPRESSION: 1. Resorption of the right lateral seventh rib and associated right lateral chest wall mass have increased significantly in size, now measuring 4.5 x 1.8 cm. Would correlate with prior biopsy results. 2. Lungs hypoexpanded, with elevation of the right hemidiaphragm. Borderline cardiomegaly. Electronically Signed   By: Garald Balding M.D.   On: 03/20/2015 18:47   Ct Head Wo Contrast  03/24/2015  CLINICAL DATA:  Multiple myeloma.  Skull lesion on exam. EXAM: CT HEAD WITHOUT CONTRAST TECHNIQUE: Contiguous axial images were obtained from the base of the skull through the vertex without intravenous contrast. COMPARISON:  None. FINDINGS: Mild atrophy. Negative for hydrocephalus. Negative for acute or chronic infarction. Negative for intracranial hemorrhage. Destructive lesion in the right frontal bone. Soft tissue mass extends into the scalp as well as posteriorly through the inner table. Margins are ill-defined and this is most compatible with neoplasm. There is a similar destructive lesion in the frontal bone involving the frontal sinus in the midline. There is associated hyperdense soft tissue mass extending into the anterior subarachnoid space and into the frontal sinus. There is bony destruction  with ill-defined margins. There is low-density in the frontal sinus which is most likely a mucocele adjacent to the mass lesion. No other skull lesions. IMPRESSION: Right frontal skull lesion with bony destruction. There is a second lesion involving the frontal sinus with soft tissue mass and bony destruction. Findings are most consistent with metastatic disease or myeloma. There is a probable mucocele in the frontal sinus. No acute abnormality of the brain. Electronically Signed   By: Franchot Gallo M.D.   On: 03/24/2015 15:31   US Renal  03/22/2015  CLINICAL DATA:  Right-sided hydronephrosis on recent CT examination EXAM: RENAL / URINARY TRACT ULTRASOUND COMPLETE COMPARISON:  03/20/2015 FINDINGS: Right  Kidney: Length: 11.8 cm.  Moderate hydronephrosis is noted. Left Kidney: Length: 9.4 cm. A hypoechoic mass lesion measuring 4.7 cm in greatest dimension is noted adjacent to the left kidney corresponding to that seen on the prior CT examination. Bladder: Decompressed by Foley catheter. Large soft tissue mass lesion is noted within similar to that noted on prior CT. IMPRESSION: Persistent right hydronephrosis despite decompression of the urinary bladder. This raises suspicion of obstructive changes at the ureterovesical junction on the right. Intraluminal mass within the bladder. Hypoechoic mass corresponding to the soft tissue lesion adjacent to the left kidney seen on recent CT. Electronically Signed   By: Inez Catalina M.D.   On: 03/22/2015 11:09    ASSESSMENT AND PLAN: This is a very pleasant 69 years old African-American male with recently diagnosed multiple myeloma.  I had a lengthy discussion with the patient and his wife about his current condition and treatment options. The patient was started on treatment with subcutaneous Velcade, Revlimid and Decadron for around 4 months but this was discontinued secondary to disease progression with increase in the retroperitoneal mass in addition to new lytic bone lesions. 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 I recommended for the patient to discontinue his current treatment with Velcade, Revlimid and Decadron. I discussed with the patient other treatment options including treatment with Carfilzomib, Cytoxan and Decadron as a second line option. I discussed with the patient adverse effect of this treatment including but not limited to mild alopecia, myelosuppression, nausea and vomiting, peripheral neuropathy, liver or renal dysfunction. He agreed to proceed with the treatment as planned and he is expected to start the first dose of this treatment next week. I will repeat myeloma  panel before starting the first dose of this treatment. He would come back for follow-up visit in 3 weeks for reevaluation and management of any adverse effect of his treatment. He was advised to call if he has any concerning symptoms in the interval. The patient voices understanding of current disease status and treatment options and is in agreement with the current care plan.  All questions were answered. The patient knows to call the clinic with any problems, questions or concerns. We can certainly see the patient much sooner if necessary.  I spent 20 minutes counseling the patient face to face. The total time spent in the appointment was 30 minutes.  Disclaimer: This note was dictated with voice recognition software. Similar sounding words can inadvertently be transcribed and may not be corrected upon review.

## 2015-04-02 NOTE — Telephone Encounter (Signed)
Per staff message and POF I have scheduled appts. Advised scheduler of appts. JMW  

## 2015-04-03 ENCOUNTER — Ambulatory Visit (INDEPENDENT_AMBULATORY_CARE_PROVIDER_SITE_OTHER): Payer: Medicare HMO | Admitting: Internal Medicine

## 2015-04-03 ENCOUNTER — Encounter: Payer: Self-pay | Admitting: Internal Medicine

## 2015-04-03 VITALS — BP 150/90 | HR 114 | Temp 98.2°F | Resp 20 | Ht 68.0 in | Wt 202.0 lb

## 2015-04-03 DIAGNOSIS — N2889 Other specified disorders of kidney and ureter: Secondary | ICD-10-CM | POA: Diagnosis not present

## 2015-04-03 DIAGNOSIS — I10 Essential (primary) hypertension: Secondary | ICD-10-CM

## 2015-04-03 DIAGNOSIS — A4152 Sepsis due to Pseudomonas: Secondary | ICD-10-CM | POA: Diagnosis not present

## 2015-04-03 DIAGNOSIS — N1330 Unspecified hydronephrosis: Secondary | ICD-10-CM | POA: Diagnosis not present

## 2015-04-03 DIAGNOSIS — C9000 Multiple myeloma not having achieved remission: Secondary | ICD-10-CM

## 2015-04-03 DIAGNOSIS — N08 Glomerular disorders in diseases classified elsewhere: Secondary | ICD-10-CM

## 2015-04-03 DIAGNOSIS — N138 Other obstructive and reflux uropathy: Secondary | ICD-10-CM

## 2015-04-03 DIAGNOSIS — N185 Chronic kidney disease, stage 5: Secondary | ICD-10-CM

## 2015-04-03 DIAGNOSIS — I120 Hypertensive chronic kidney disease with stage 5 chronic kidney disease or end stage renal disease: Secondary | ICD-10-CM

## 2015-04-03 DIAGNOSIS — N401 Enlarged prostate with lower urinary tract symptoms: Secondary | ICD-10-CM

## 2015-04-03 LAB — URINE CULTURE

## 2015-04-03 NOTE — Progress Notes (Signed)
Subjective:    Patient ID: Wesley [name], male    DOB: [birth year], 69 y.o.   MRN: [id number]  HPI Admit date: 03/20/2015 Discharge date: 03/25/2015  Recommendations for Outpatient Follow-up:  Continue Cipro and Augmentin for 5 more days on discharge. I spoke with oncology PA cynthia who will f/u with Dr. [name] of pt's new findings on CT head and f/u appt will be scheduled in their center.  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  He was seen and followed by oncology yesterday and will be started on a new chemotherapeutic regimen for myeloma.  He generally feels well today. No fever, chills.  He states that he is voiding well.  He has completed antibiotic therapy for UTI with sepsis syndrome.  Past Medical History  Diagnosis Date  . Pulmonary nodule, right     W/  CHEST WALL MASS--  SCHEDULED FOR BIOPSY W/ DR [name] ON FRIDAY 10-27-2013  . History of seizures as a child     AGE 73 OR 8--- NONE ISSUE SINCE  . History of GI bleed     SECONDARY TO ULCER --  DEC 2011  RESOLVED PER LAST EGD 07-05-2010  . Bladder tumor   . Hematuria   . Radiation 12/07/13-12/27/13    right anterior chest 37.5 gray  . Fracture of radius 09/29/14    left  . Cancer [facility])     multiple myeloma, bladder cancer 2015    Social History   Social History  . Marital Status: Married    Spouse Name: N/A  . Number of Children: 2  . Years of Education: N/A   Occupational History  . retired    Social History Main Topics  .  Smoking status: Former Smoker -- 0.10 packs/day for 1 years    Types: Cigarettes    Quit date: 05/04/1969  . Smokeless tobacco: Never Used  . Alcohol Use: No  . Drug Use: No  . Sexual Activity: Not on file   Other Topics Concern  . Not on file   Social History Narrative    Past Surgical History  Procedure Laterality Date  . Excision benign left axilla tumor  1993  . Esophagogastroduodenoscopy  X5   LAST ONE 07-05-2010  . Transurethral resection of bladder tumor with gyrus (turbt-gyrus) N/A 10/25/2013    Procedure: TRANSURETHRAL RESECTION OF BLADDER TUMOR WITH GYRUS (TURBT-GYRUS);  Surgeon: Sharyn Creamer, MD;  Location: [facility];  Service: Urology;  Laterality: N/A;  . Cystoscopy with biopsy N/A 10/25/2013    Procedure: CYSTOSCOPY;  Surgeon: Sharyn Creamer, MD;  Location: [facility];  Service: Urology;  Laterality: N/A;  . Bone biopsy Right 11/14/13     right seventh rib lesion  . Orif radial fracture Left 10/22/2014    Procedure: Intramedullary nailing left radius fracture;  Surgeon: Milly Jakob, MD;  Location: [facility];  Service: Orthopedics;  Laterality: Left;  OPEN TREATMENT LEFT RADIUS FRACTURE  . Cystoscopy with retrograde pyelogram, ureteroscopy and stent placement N/A 02/21/2015    Procedure: CYSTOSCOPY, EXAM UNDER ANESTHESIA,  FOLEY CATHETER PLACEMENT;  Surgeon: Festus Aloe, MD;  Location: [facility];  Service: Urology;  Laterality: N/A;    Family History  Problem Relation Age of Onset  . Heart disease Mother   . Cancer - Prostate Brother     No Known Allergies  Current Outpatient Prescriptions on File Prior to Visit  Medication Sig Dispense Refill  . acetaminophen (TYLENOL) 325 MG tablet Take 2 tablets (650 mg total) by mouth every 4 (four) hours as needed for mild pain, fever or headache. 30 tablet 0  . Bortezomib (VELCADE IJ) Inject as directed. Infusion at [facility]    . dexamethasone (DECADRON) 4 MG tablet 10  tablet by mouth every week starting with the first dose of chemotherapy. 80 tablet 2  . finasteride (PROSCAR) 5 MG tablet Take 1 tablet (5 mg total) by mouth daily. 30 tablet 1  . hydrALAZINE (APRESOLINE) 50 MG tablet Take 1 tablet (50 mg total) by mouth every 8 (eight) hours. 90 tablet 1  . HYDROcodone-acetaminophen (NORCO/VICODIN) 5-325 MG tablet Take 1-2 tablets by mouth every 4 (four) hours as needed for moderate pain. 65 tablet 0  . tamsulosin (FLOMAX) 0.4 MG CAPS capsule Take 1 capsule (0.4 mg total) by mouth daily. 30 capsule 1  . warfarin (COUMADIN) 2 MG tablet Take 1 tablet (2 mg total) by mouth daily. 30 tablet 1   No current facility-administered medications on file prior to visit.    BP 150/90 mmHg  Pulse 114  Temp(Src) 98.2 F (36.8 C) (Oral)  Resp 20  Ht $R'5\' 8"'dv$  (1.727 m)  Wt 202 lb (91.627 kg)  BMI 30.72 kg/m2  SpO2 98%     Discharge Condition: stable    Review of Systems  Constitutional: Negative for fever, chills, appetite change and fatigue.  HENT: Negative for congestion, dental problem, ear pain, hearing loss, sore throat, tinnitus, trouble swallowing and voice change.   Eyes: Negative for pain, discharge and visual disturbance.  Respiratory: Negative for cough, chest tightness, wheezing and stridor.   Cardiovascular: Negative for chest pain, palpitations and leg swelling.  Gastrointestinal: Negative for nausea, vomiting, abdominal pain, diarrhea, constipation, blood in stool and abdominal distention.  Genitourinary: Negative for urgency, hematuria, flank pain, discharge, difficulty urinating and genital sores.  Musculoskeletal: Negative for myalgias, back pain, joint swelling, arthralgias, gait problem and neck stiffness.  Skin: Negative for rash.  Neurological: Negative for dizziness, syncope, speech difficulty, weakness, numbness and headaches.  Hematological: Negative for adenopathy. Does not bruise/bleed easily.  Psychiatric/Behavioral: Negative for  behavioral problems and dysphoric mood. The patient is not nervous/anxious.        Objective:   Physical Exam  Constitutional: He is oriented to person, place, and time. He appears well-developed.  Blood pressure 144/90 Pulse 100  HENT:  Head: Normocephalic.  Right Ear: External ear normal.  Left Ear: External ear normal.  Eyes: Conjunctivae and EOM are normal.  Neck: Normal range of motion.  Cardiovascular: Normal rate and normal heart sounds.   Pulmonary/Chest: Breath sounds normal.  Abdominal: Bowel sounds are normal.  Musculoskeletal: Normal range of motion. He exhibits no edema or tenderness.  Trace lower extremity edema  Neurological: He is alert and oriented to person, place, and time.  Psychiatric: He has a normal mood and affect. His behavior is normal.          Assessment & Plan:  Hypertension Chronic kidney disease/ BPH and history of bilateral hydronephrosis/myeloma kidney Both myeloma Status post UTI with sepsis syndrome.  Antibiotic therapy has  been completed  Close follow-up oncology Return here 6 months or as needed

## 2015-04-03 NOTE — Patient Instructions (Signed)
Limit your sodium (Salt) intake  Return in 6 months for follow-up  

## 2015-04-03 NOTE — Progress Notes (Signed)
Pre visit review using our clinic review tool, if applicable. No additional management support is needed unless otherwise documented below in the visit note. 

## 2015-04-08 ENCOUNTER — Other Ambulatory Visit: Payer: Self-pay | Admitting: Medical Oncology

## 2015-04-08 ENCOUNTER — Ambulatory Visit (HOSPITAL_COMMUNITY)
Admission: RE | Admit: 2015-04-08 | Discharge: 2015-04-08 | Disposition: A | Discharge status: Home / Self Care | Payer: Medicare HMO | Source: Ambulatory Visit | Attending: Internal Medicine | Admitting: Internal Medicine

## 2015-04-08 ENCOUNTER — Telehealth: Payer: Self-pay | Admitting: Internal Medicine

## 2015-04-08 ENCOUNTER — Ambulatory Visit (HOSPITAL_BASED_OUTPATIENT_CLINIC_OR_DEPARTMENT_OTHER): Payer: Medicare HMO

## 2015-04-08 ENCOUNTER — Other Ambulatory Visit (HOSPITAL_BASED_OUTPATIENT_CLINIC_OR_DEPARTMENT_OTHER): Payer: Medicare HMO

## 2015-04-08 VITALS — BP 158/83 | HR 80 | Temp 98.9°F | Resp 18

## 2015-04-08 DIAGNOSIS — Z5111 Encounter for antineoplastic chemotherapy: Secondary | ICD-10-CM | POA: Diagnosis not present

## 2015-04-08 DIAGNOSIS — D649 Anemia, unspecified: Secondary | ICD-10-CM

## 2015-04-08 DIAGNOSIS — N08 Glomerular disorders in diseases classified elsewhere: Principal | ICD-10-CM

## 2015-04-08 DIAGNOSIS — C9000 Multiple myeloma not having achieved remission: Secondary | ICD-10-CM | POA: Diagnosis not present

## 2015-04-08 DIAGNOSIS — Z5112 Encounter for antineoplastic immunotherapy: Secondary | ICD-10-CM

## 2015-04-08 DIAGNOSIS — R110 Nausea: Secondary | ICD-10-CM

## 2015-04-08 LAB — CBC WITH DIFFERENTIAL/PLATELET
BASO%: 1.3 % (ref 0.0–2.0)
BASOS ABS: 0.1 10*3/uL (ref 0.0–0.1)
EOS%: 3 % (ref 0.0–7.0)
Eosinophils Absolute: 0.1 10*3/uL (ref 0.0–0.5)
HCT: 22.2 % — ABNORMAL LOW (ref 38.4–49.9)
HGB: 7.4 g/dL — ABNORMAL LOW (ref 13.0–17.1)
LYMPH%: 13.1 % — AB (ref 14.0–49.0)
MCH: 31.2 pg (ref 27.2–33.4)
MCHC: 33.3 g/dL (ref 32.0–36.0)
MCV: 93.6 fL (ref 79.3–98.0)
MONO#: 0.6 10*3/uL (ref 0.1–0.9)
MONO%: 15 % — ABNORMAL HIGH (ref 0.0–14.0)
NEUT#: 2.6 10*3/uL (ref 1.5–6.5)
NEUT%: 67.6 % (ref 39.0–75.0)
PLATELETS: 162 10*3/uL (ref 140–400)
RBC: 2.37 10*6/uL — AB (ref 4.20–5.82)
RDW: 19.6 % — ABNORMAL HIGH (ref 11.0–14.6)
WBC: 3.8 10*3/uL — ABNORMAL LOW (ref 4.0–10.3)
lymph#: 0.5 10*3/uL — ABNORMAL LOW (ref 0.9–3.3)

## 2015-04-08 LAB — COMPREHENSIVE METABOLIC PANEL
ALBUMIN: 3.3 g/dL — AB (ref 3.5–5.0)
ALT: 12 U/L (ref 0–55)
AST: 17 U/L (ref 5–34)
Alkaline Phosphatase: 68 U/L (ref 40–150)
Anion Gap: 8 mEq/L (ref 3–11)
BILIRUBIN TOTAL: 0.67 mg/dL (ref 0.20–1.20)
BUN: 22.9 mg/dL (ref 7.0–26.0)
CO2: 21 meq/L — AB (ref 22–29)
CREATININE: 2.2 mg/dL — AB (ref 0.7–1.3)
Calcium: 8.9 mg/dL (ref 8.4–10.4)
Chloride: 111 mEq/L — ABNORMAL HIGH (ref 98–109)
EGFR: 35 mL/min/{1.73_m2} — AB (ref >90)
GLUCOSE: 121 mg/dL (ref 70–140)
Potassium: 4.1 mEq/L (ref 3.5–5.1)
SODIUM: 140 meq/L (ref 136–145)
TOTAL PROTEIN: 7.1 g/dL (ref 6.4–8.3)

## 2015-04-08 LAB — LACTATE DEHYDROGENASE: LDH: 205 U/L (ref 125–245)

## 2015-04-08 MED ORDER — SODIUM CHLORIDE 0.9 % IV SOLN
Freq: Once | INTRAVENOUS | Status: AC
  Administered 2015-04-08: 15:00:00 via INTRAVENOUS

## 2015-04-08 MED ORDER — DEXTROSE 5 % IV SOLN
20.0000 mg/m2 | Freq: Once | INTRAVENOUS | Status: AC
  Administered 2015-04-08: 42 mg via INTRAVENOUS
  Filled 2015-04-08: qty 21

## 2015-04-08 MED ORDER — SODIUM CHLORIDE 0.9 % IV SOLN
Freq: Once | INTRAVENOUS | Status: AC
  Administered 2015-04-08: 16:00:00 via INTRAVENOUS
  Filled 2015-04-08: qty 4

## 2015-04-08 MED ORDER — SODIUM CHLORIDE 0.9 % IV SOLN: Freq: Once | INTRAVENOUS | Status: DC

## 2015-04-08 MED ORDER — PROCHLORPERAZINE MALEATE 10 MG PO TABS: 10.0000 mg | ORAL_TABLET | Freq: Four times a day (QID) | ORAL | Status: AC | PRN

## 2015-04-08 MED ORDER — SODIUM CHLORIDE 0.9 % IV SOLN
300.0000 mg/m2 | Freq: Once | INTRAVENOUS | Status: AC
  Administered 2015-04-08: 620 mg via INTRAVENOUS
  Filled 2015-04-08: qty 31

## 2015-04-08 NOTE — Progress Notes (Signed)
har done 

## 2015-04-08 NOTE — Patient Instructions (Signed)
[place] Discharge Instructions for Patients Receiving Chemotherapy  Today you received the following chemotherapy agents: Kyprolis and Cytoxan.  To help prevent nausea and vomiting after your treatment, we encourage you to take your nausea medication: Compazine 10 mg every 6 hours as needed.   If you develop nausea and vomiting that is not controlled by your nausea medication, call the clinic.   BELOW ARE SYMPTOMS THAT SHOULD BE REPORTED IMMEDIATELY:  *FEVER GREATER THAN 100.5 F  *CHILLS WITH OR WITHOUT FEVER  NAUSEA AND VOMITING THAT IS NOT CONTROLLED WITH YOUR NAUSEA MEDICATION  *UNUSUAL SHORTNESS OF BREATH  *UNUSUAL BRUISING OR BLEEDING  TENDERNESS IN MOUTH AND THROAT WITH OR WITHOUT PRESENCE OF ULCERS  *URINARY PROBLEMS  *BOWEL PROBLEMS  UNUSUAL RASH Items with * indicate a potential emergency and should be followed up as soon as possible.  Feel free to call the clinic you have any questions or concerns. The clinic phone number is [phone].  Please show the [place] at check-in to the Emergency Department and triage nurse.

## 2015-04-08 NOTE — Telephone Encounter (Signed)
PER 12/5 POF TYPE AND CROSS FOR 1 UNIT TOMORROW. HAR ALREADY OPENED ON APPOINTMENT DESK. SPOKE WITH INF SCHEDULER RE ADDING 1 UNIT TO TX TOMORROW - DURATION ADJUSTED TO INCLUDE BLOOD. PATIENT WILL GET AVS AND APPOINTMENTS IN INF AREA.

## 2015-04-08 NOTE — Progress Notes (Signed)
Reviewed labs. HGB is 7.4. Creatinine is 2.2. Dr. [name] made aware. Type and cross obtained for 1 unit of blood to be transfused tomorrow. OK to treat with cytoxan and kyprolis today.  No anti-emetics for home use. Abelina Bachelor, RN with Dr. [name] made aware and compazine 10 mg every 6 hours as needed will be called in to his local pharmacy

## 2015-04-09 ENCOUNTER — Ambulatory Visit (HOSPITAL_BASED_OUTPATIENT_CLINIC_OR_DEPARTMENT_OTHER): Payer: Medicare HMO

## 2015-04-09 VITALS — BP 149/91 | HR 72 | Temp 97.0°F | Resp 18

## 2015-04-09 DIAGNOSIS — N08 Glomerular disorders in diseases classified elsewhere: Principal | ICD-10-CM

## 2015-04-09 DIAGNOSIS — C9000 Multiple myeloma not having achieved remission: Secondary | ICD-10-CM

## 2015-04-09 DIAGNOSIS — D6489 Other specified anemias: Secondary | ICD-10-CM

## 2015-04-09 DIAGNOSIS — Z5112 Encounter for antineoplastic immunotherapy: Secondary | ICD-10-CM

## 2015-04-09 DIAGNOSIS — D649 Anemia, unspecified: Secondary | ICD-10-CM | POA: Diagnosis not present

## 2015-04-09 LAB — PREPARE RBC (CROSSMATCH)

## 2015-04-09 MED ORDER — SODIUM CHLORIDE 0.9 % IJ SOLN
10.0000 mL | INTRAMUSCULAR | Status: DC | PRN
  Filled 2015-04-09: qty 10

## 2015-04-09 MED ORDER — SODIUM CHLORIDE 0.9 % IV SOLN
Freq: Once | INTRAVENOUS | Status: AC
  Administered 2015-04-09: 14:00:00 via INTRAVENOUS

## 2015-04-09 MED ORDER — SODIUM CHLORIDE 0.9 % IV SOLN
Freq: Once | INTRAVENOUS | Status: AC
  Administered 2015-04-09: 15:00:00 via INTRAVENOUS
  Filled 2015-04-09: qty 4

## 2015-04-09 MED ORDER — ACETAMINOPHEN 325 MG PO TABS
650.0000 mg | ORAL_TABLET | Freq: Once | ORAL | Status: AC
  Administered 2015-04-09: 650 mg via ORAL

## 2015-04-09 MED ORDER — SODIUM CHLORIDE 0.9 % IJ SOLN
3.0000 mL | INTRAMUSCULAR | Status: DC | PRN
  Filled 2015-04-09: qty 10

## 2015-04-09 MED ORDER — HEPARIN SOD (PORK) LOCK FLUSH 100 UNIT/ML IV SOLN
250.0000 [IU] | INTRAVENOUS | Status: DC | PRN
  Filled 2015-04-09: qty 5

## 2015-04-09 MED ORDER — HEPARIN SOD (PORK) LOCK FLUSH 100 UNIT/ML IV SOLN
500.0000 [IU] | Freq: Every day | INTRAVENOUS | Status: DC | PRN
  Filled 2015-04-09: qty 5

## 2015-04-09 MED ORDER — DIPHENHYDRAMINE HCL 25 MG PO CAPS
ORAL_CAPSULE | ORAL | Status: AC
  Filled 2015-04-09: qty 1

## 2015-04-09 MED ORDER — DIPHENHYDRAMINE HCL 25 MG PO CAPS
25.0000 mg | ORAL_CAPSULE | Freq: Once | ORAL | Status: AC
  Administered 2015-04-09: 25 mg via ORAL

## 2015-04-09 MED ORDER — SODIUM CHLORIDE 0.9 % IV SOLN
250.0000 mL | Freq: Once | INTRAVENOUS | Status: AC
  Administered 2015-04-09: 250 mL via INTRAVENOUS

## 2015-04-09 MED ORDER — CARFILZOMIB CHEMO INJECTION 60 MG
20.0000 mg/m2 | Freq: Once | INTRAVENOUS | Status: AC
  Administered 2015-04-09: 42 mg via INTRAVENOUS
  Filled 2015-04-09: qty 21

## 2015-04-09 MED ORDER — ACETAMINOPHEN 325 MG PO TABS
ORAL_TABLET | ORAL | Status: AC
  Filled 2015-04-09: qty 2

## 2015-04-09 NOTE — Patient Instructions (Signed)
  [facility] Discharge Instructions for Patients Receiving Chemotherapy  Today you received the following chemotherapy agents Kryprolis  To help prevent nausea and vomiting after your treatment, we encourage you to take your nausea medication as directed   If you develop nausea and vomiting that is not controlled by your nausea medication, call the clinic.   BELOW ARE SYMPTOMS THAT SHOULD BE REPORTED IMMEDIATELY:  *FEVER GREATER THAN 100.5 F  *CHILLS WITH OR WITHOUT FEVER  NAUSEA AND VOMITING THAT IS NOT CONTROLLED WITH YOUR NAUSEA MEDICATION  *UNUSUAL SHORTNESS OF BREATH  *UNUSUAL BRUISING OR BLEEDING  TENDERNESS IN MOUTH AND THROAT WITH OR WITHOUT PRESENCE OF ULCERS  *URINARY PROBLEMS  *BOWEL PROBLEMS  UNUSUAL RASH Items with * indicate a potential emergency and should be followed up as soon as possible.  Feel free to call the clinic you have any questions or concerns. The clinic phone number is [phone].  Please show the [facility] at check-in to the Emergency Department and triage nurse.     Blood Transfusion  A blood transfusion is a procedure that gives you donated blood through an IV tube. You may need blood because of illness, surgery, or injury. The blood may come from a donor. The blood may also be your own blood that you donated earlier. The blood you get is made up of different types of cells. You may get:  9. Red blood cells. These carry oxygen and replace lost blood.  10. Platelets. These control bleeding.  11. Plasma. This helps blood to clot. If you have a clotting disorder, you may also get other types of blood products.  BEFORE THE PROCEDURE 2. You may have a blood test. This finds out what type of blood you have. It also finds out what kind of blood your body will accept.  3. If you are going to have a planned surgery, you may donate your own blood. This is done in case you need to have a transfusion.  4. If you have had  an allergic transfusion reaction before, you may be given medicine to help prevent a reaction. Take this medicine only as told by your doctor. 5. You will have your temperature, blood pressure, and pulse checked. PROCEDURE   An IV will be started in your hand or arm.   The bag of donated blood will be attached to your IV and run into your vein.   A doctor will regularly check your temperature, blood pressure, and pulse during the procedure. This is done to find any early signs of a transfusion reaction.  If you have any signs or symptoms of a reaction, the procedure may be stopped and you may be given medicine.   When the transfusion is over, your IV will be removed.   Pressure may be applied to the IV site for a few minutes.   A bandage (dressing) will be applied.  The procedure may vary among doctors and hospitals.  AFTER THE PROCEDURE  Your blood pressure, temperature, and pulse will be checked regularly.   This information is not intended to replace advice given to you by your health care provider. Make sure you discuss any questions you have with your health care provider.   Document Released: 07/17/2008 Document Revised: 05/11/2014 Document Reviewed: 02/28/2014 Elsevier Interactive Patient Education [employer].

## 2015-04-10 LAB — IGG, IGA, IGM
IGA: 27 mg/dL — AB (ref 68–379)
IGM, SERUM: 28 mg/dL — AB (ref 41–251)
IgG (Immunoglobin G), Serum: 1600 mg/dL (ref 650–1600)

## 2015-04-10 LAB — KAPPA/LAMBDA LIGHT CHAINS
Kappa free light chain: 71.1 mg/dL — ABNORMAL HIGH (ref 0.33–1.94)
Kappa:Lambda Ratio: 69.03 — ABNORMAL HIGH (ref 0.26–1.65)
LAMBDA FREE LGHT CHN: 1.03 mg/dL (ref 0.57–2.63)

## 2015-04-10 LAB — TYPE AND SCREEN
ABO/RH(D): B POS
ANTIBODY SCREEN: NEGATIVE
UNIT DIVISION: 0

## 2015-04-10 LAB — BETA 2 MICROGLOBULIN, SERUM: Beta-2 Microglobulin: 4.61 mg/L — ABNORMAL HIGH (ref <=2.51)

## 2015-04-15 ENCOUNTER — Other Ambulatory Visit (HOSPITAL_BASED_OUTPATIENT_CLINIC_OR_DEPARTMENT_OTHER): Payer: Medicare HMO

## 2015-04-15 ENCOUNTER — Ambulatory Visit (HOSPITAL_BASED_OUTPATIENT_CLINIC_OR_DEPARTMENT_OTHER): Payer: Medicare HMO

## 2015-04-15 VITALS — BP 169/94 | HR 98 | Temp 97.1°F | Resp 20

## 2015-04-15 DIAGNOSIS — Z5112 Encounter for antineoplastic immunotherapy: Secondary | ICD-10-CM

## 2015-04-15 DIAGNOSIS — Z5111 Encounter for antineoplastic chemotherapy: Secondary | ICD-10-CM

## 2015-04-15 DIAGNOSIS — C9000 Multiple myeloma not having achieved remission: Secondary | ICD-10-CM

## 2015-04-15 DIAGNOSIS — N08 Glomerular disorders in diseases classified elsewhere: Principal | ICD-10-CM

## 2015-04-15 LAB — CBC WITH DIFFERENTIAL/PLATELET
BASO%: 0.7 % (ref 0.0–2.0)
BASOS ABS: 0 10*3/uL (ref 0.0–0.1)
EOS ABS: 0.2 10*3/uL (ref 0.0–0.5)
EOS%: 3.6 % (ref 0.0–7.0)
HCT: 29.7 % — ABNORMAL LOW (ref 38.4–49.9)
HGB: 9.7 g/dL — ABNORMAL LOW (ref 13.0–17.1)
LYMPH%: 10.3 % — AB (ref 14.0–49.0)
MCH: 31 pg (ref 27.2–33.4)
MCHC: 32.8 g/dL (ref 32.0–36.0)
MCV: 94.7 fL (ref 79.3–98.0)
MONO#: 0.9 10*3/uL (ref 0.1–0.9)
MONO%: 15.9 % — AB (ref 0.0–14.0)
NEUT#: 3.8 10*3/uL (ref 1.5–6.5)
NEUT%: 69.5 % (ref 39.0–75.0)
Platelets: 135 10*3/uL — ABNORMAL LOW (ref 140–400)
RBC: 3.14 10*6/uL — AB (ref 4.20–5.82)
RDW: 18.9 % — ABNORMAL HIGH (ref 11.0–14.6)
WBC: 5.5 10*3/uL (ref 4.0–10.3)
lymph#: 0.6 10*3/uL — ABNORMAL LOW (ref 0.9–3.3)

## 2015-04-15 LAB — COMPREHENSIVE METABOLIC PANEL
ALT: 28 U/L (ref 0–55)
AST: 20 U/L (ref 5–34)
Albumin: 3.7 g/dL (ref 3.5–5.0)
Alkaline Phosphatase: 73 U/L (ref 40–150)
Anion Gap: 11 mEq/L (ref 3–11)
BUN: 30.9 mg/dL — AB (ref 7.0–26.0)
CHLORIDE: 112 meq/L — AB (ref 98–109)
CO2: 21 mEq/L — ABNORMAL LOW (ref 22–29)
Calcium: 9 mg/dL (ref 8.4–10.4)
Creatinine: 2.4 mg/dL — ABNORMAL HIGH (ref 0.7–1.3)
EGFR: 32 mL/min/{1.73_m2} — ABNORMAL LOW (ref >90)
GLUCOSE: 118 mg/dL (ref 70–140)
POTASSIUM: 4.4 meq/L (ref 3.5–5.1)
SODIUM: 143 meq/L (ref 136–145)
Total Bilirubin: 0.91 mg/dL (ref 0.20–1.20)
Total Protein: 7.7 g/dL (ref 6.4–8.3)

## 2015-04-15 MED ORDER — SODIUM CHLORIDE 0.9 % IV SOLN
Freq: Once | INTRAVENOUS | Status: AC
  Administered 2015-04-15: 11:00:00 via INTRAVENOUS

## 2015-04-15 MED ORDER — DEXTROSE 5 % IV SOLN
36.0000 mg/m2 | Freq: Once | INTRAVENOUS | Status: AC
  Administered 2015-04-15: 76 mg via INTRAVENOUS
  Filled 2015-04-15: qty 38

## 2015-04-15 MED ORDER — SODIUM CHLORIDE 0.9 % IV SOLN
Freq: Once | INTRAVENOUS | Status: AC
  Administered 2015-04-15: 11:00:00 via INTRAVENOUS
  Filled 2015-04-15: qty 4

## 2015-04-15 MED ORDER — SODIUM CHLORIDE 0.9 % IV SOLN
300.0000 mg/m2 | Freq: Once | INTRAVENOUS | Status: AC
  Administered 2015-04-15: 620 mg via INTRAVENOUS
  Filled 2015-04-15: qty 31

## 2015-04-15 NOTE — Progress Notes (Signed)
Ok to treat patient with Cr 2.4 today per Dr. [name].

## 2015-04-15 NOTE — Patient Instructions (Signed)
[facility] Discharge Instructions for Patients Receiving Chemotherapy  Today you received the following chemotherapy agents:  Kyprolis and Cytoxan  To help prevent nausea and vomiting after your treatment, we encourage you to take your nausea medication as ordered per MD.   If you develop nausea and vomiting that is not controlled by your nausea medication, call the clinic.   BELOW ARE SYMPTOMS THAT SHOULD BE REPORTED IMMEDIATELY:  *FEVER GREATER THAN 100.5 F  *CHILLS WITH OR WITHOUT FEVER  NAUSEA AND VOMITING THAT IS NOT CONTROLLED WITH YOUR NAUSEA MEDICATION  *UNUSUAL SHORTNESS OF BREATH  *UNUSUAL BRUISING OR BLEEDING  TENDERNESS IN MOUTH AND THROAT WITH OR WITHOUT PRESENCE OF ULCERS  *URINARY PROBLEMS  *BOWEL PROBLEMS  UNUSUAL RASH Items with * indicate a potential emergency and should be followed up as soon as possible.  Feel free to call the clinic you have any questions or concerns. The clinic phone number is [phone].  Please show the CHEMO ALERT CARD at check-in to the Emergency Department and triage nurse.   

## 2015-04-16 ENCOUNTER — Ambulatory Visit (HOSPITAL_BASED_OUTPATIENT_CLINIC_OR_DEPARTMENT_OTHER): Payer: Medicare HMO

## 2015-04-16 VITALS — BP 153/85 | HR 98 | Temp 98.0°F | Resp 18

## 2015-04-16 DIAGNOSIS — Z5112 Encounter for antineoplastic immunotherapy: Secondary | ICD-10-CM

## 2015-04-16 DIAGNOSIS — C9000 Multiple myeloma not having achieved remission: Secondary | ICD-10-CM

## 2015-04-16 DIAGNOSIS — N08 Glomerular disorders in diseases classified elsewhere: Principal | ICD-10-CM

## 2015-04-16 MED ORDER — DEXTROSE 5 % IV SOLN
36.0000 mg/m2 | Freq: Once | INTRAVENOUS | Status: AC
  Administered 2015-04-16: 76 mg via INTRAVENOUS
  Filled 2015-04-16: qty 38

## 2015-04-16 MED ORDER — SODIUM CHLORIDE 0.9 % IV SOLN
Freq: Once | INTRAVENOUS | Status: AC
  Administered 2015-04-16: 10:00:00 via INTRAVENOUS

## 2015-04-16 MED ORDER — SODIUM CHLORIDE 0.9 % IV SOLN
Freq: Once | INTRAVENOUS | Status: AC
  Administered 2015-04-16: 10:00:00 via INTRAVENOUS
  Filled 2015-04-16: qty 4

## 2015-04-16 NOTE — Patient Instructions (Signed)
[facility] Discharge Instructions for Patients Receiving Chemotherapy  Today you received the following chemotherapy agents: Kyprolis   To help prevent nausea and vomiting after your treatment, we encourage you to take your nausea medication as directed.    If you develop nausea and vomiting that is not controlled by your nausea medication, call the clinic.   BELOW ARE SYMPTOMS THAT SHOULD BE REPORTED IMMEDIATELY:  *FEVER GREATER THAN 100.5 F  *CHILLS WITH OR WITHOUT FEVER  NAUSEA AND VOMITING THAT IS NOT CONTROLLED WITH YOUR NAUSEA MEDICATION  *UNUSUAL SHORTNESS OF BREATH  *UNUSUAL BRUISING OR BLEEDING  TENDERNESS IN MOUTH AND THROAT WITH OR WITHOUT PRESENCE OF ULCERS  *URINARY PROBLEMS  *BOWEL PROBLEMS  UNUSUAL RASH Items with * indicate a potential emergency and should be followed up as soon as possible.  Feel free to call the clinic you have any questions or concerns. The clinic phone number is [phone].  Please show the CHEMO ALERT CARD at check-in to the Emergency Department and triage nurse.   

## 2015-04-18 ENCOUNTER — Other Ambulatory Visit: Payer: Self-pay | Admitting: *Deleted

## 2015-04-18 NOTE — Telephone Encounter (Signed)
COumadin refill reviewed with MD. Pt is no longer taking Revlimid. Coumadin will be discontinued as it was a preventative medication. Notified pt d;/c warfarin per MD. Pt verbalized understanding.

## 2015-04-22 ENCOUNTER — Telehealth: Payer: Self-pay | Admitting: Physician Assistant

## 2015-04-22 ENCOUNTER — Ambulatory Visit (HOSPITAL_BASED_OUTPATIENT_CLINIC_OR_DEPARTMENT_OTHER): Payer: Medicare HMO

## 2015-04-22 ENCOUNTER — Telehealth: Payer: Self-pay | Admitting: *Deleted

## 2015-04-22 ENCOUNTER — Ambulatory Visit (HOSPITAL_BASED_OUTPATIENT_CLINIC_OR_DEPARTMENT_OTHER): Payer: Medicare HMO | Admitting: Physician Assistant

## 2015-04-22 ENCOUNTER — Other Ambulatory Visit (HOSPITAL_BASED_OUTPATIENT_CLINIC_OR_DEPARTMENT_OTHER): Payer: Medicare HMO

## 2015-04-22 VITALS — BP 172/93 | HR 97 | Temp 97.9°F | Resp 18 | Ht 68.0 in | Wt 206.1 lb

## 2015-04-22 DIAGNOSIS — Z5112 Encounter for antineoplastic immunotherapy: Secondary | ICD-10-CM | POA: Diagnosis not present

## 2015-04-22 DIAGNOSIS — N189 Chronic kidney disease, unspecified: Secondary | ICD-10-CM

## 2015-04-22 DIAGNOSIS — C9000 Multiple myeloma not having achieved remission: Secondary | ICD-10-CM

## 2015-04-22 DIAGNOSIS — Z5111 Encounter for antineoplastic chemotherapy: Secondary | ICD-10-CM

## 2015-04-22 DIAGNOSIS — N08 Glomerular disorders in diseases classified elsewhere: Principal | ICD-10-CM

## 2015-04-22 LAB — COMPREHENSIVE METABOLIC PANEL
ALT: 16 U/L (ref 0–55)
ANION GAP: 9 meq/L (ref 3–11)
AST: 12 U/L (ref 5–34)
Albumin: 3.5 g/dL (ref 3.5–5.0)
Alkaline Phosphatase: 64 U/L (ref 40–150)
BUN: 28.9 mg/dL — AB (ref 7.0–26.0)
CHLORIDE: 113 meq/L — AB (ref 98–109)
CO2: 19 meq/L — AB (ref 22–29)
CREATININE: 2.5 mg/dL — AB (ref 0.7–1.3)
Calcium: 8.8 mg/dL (ref 8.4–10.4)
EGFR: 29 mL/min/{1.73_m2} — ABNORMAL LOW (ref >90)
Glucose: 105 mg/dl (ref 70–140)
Potassium: 4.2 mEq/L (ref 3.5–5.1)
Sodium: 140 mEq/L (ref 136–145)
Total Bilirubin: 1.14 mg/dL (ref 0.20–1.20)
Total Protein: 7.2 g/dL (ref 6.4–8.3)

## 2015-04-22 LAB — CBC WITH DIFFERENTIAL/PLATELET
BASO%: 0 % (ref 0.0–2.0)
Basophils Absolute: 0 10*3/uL (ref 0.0–0.1)
EOS%: 3.6 % (ref 0.0–7.0)
Eosinophils Absolute: 0.2 10*3/uL (ref 0.0–0.5)
HCT: 26.3 % — ABNORMAL LOW (ref 38.4–49.9)
HGB: 8.7 g/dL — ABNORMAL LOW (ref 13.0–17.1)
LYMPH%: 18.4 % (ref 14.0–49.0)
MCH: 30.5 pg (ref 27.2–33.4)
MCHC: 33.1 g/dL (ref 32.0–36.0)
MCV: 92.3 fL (ref 79.3–98.0)
MONO#: 0.5 10*3/uL (ref 0.1–0.9)
MONO%: 10.2 % (ref 0.0–14.0)
NEUT#: 3.5 10*3/uL (ref 1.5–6.5)
NEUT%: 67.8 % (ref 39.0–75.0)
Platelets: 101 10*3/uL — ABNORMAL LOW (ref 140–400)
RBC: 2.85 10*6/uL — AB (ref 4.20–5.82)
RDW: 18.1 % — ABNORMAL HIGH (ref 11.0–14.6)
WBC: 5.2 10*3/uL (ref 4.0–10.3)
lymph#: 1 10*3/uL (ref 0.9–3.3)

## 2015-04-22 MED ORDER — CARFILZOMIB CHEMO INJECTION 60 MG
36.0000 mg/m2 | Freq: Once | INTRAVENOUS | Status: AC
  Administered 2015-04-22: 76 mg via INTRAVENOUS
  Filled 2015-04-22: qty 38

## 2015-04-22 MED ORDER — SODIUM CHLORIDE 0.9 % IV SOLN
Freq: Once | INTRAVENOUS | Status: AC
  Administered 2015-04-22: 12:00:00 via INTRAVENOUS

## 2015-04-22 MED ORDER — CYCLOPHOSPHAMIDE CHEMO INJECTION 1 GM
300.0000 mg/m2 | Freq: Once | INTRAMUSCULAR | Status: AC
  Administered 2015-04-22: 620 mg via INTRAVENOUS
  Filled 2015-04-22: qty 31

## 2015-04-22 MED ORDER — DEXAMETHASONE SODIUM PHOSPHATE 100 MG/10ML IJ SOLN
Freq: Once | INTRAMUSCULAR | Status: AC
  Administered 2015-04-22: 12:00:00 via INTRAVENOUS
  Filled 2015-04-22: qty 4

## 2015-04-22 MED ORDER — SODIUM CHLORIDE 0.9 % IV SOLN
Freq: Once | INTRAVENOUS | Status: AC
  Administered 2015-04-22: 14:00:00 via INTRAVENOUS

## 2015-04-22 NOTE — Telephone Encounter (Signed)
Per staff message and POF I have scheduled appts. Advised scheduler of appts. JMW  

## 2015-04-22 NOTE — Telephone Encounter (Signed)
per pof to sch pt appt-sent MW email to sch pt trmt-pt to get updated copy b4 leaving °

## 2015-04-22 NOTE — Progress Notes (Signed)
[facility] [phone]   [phone]  OFFICE PROGRESS NOTE  Wesley Cowden, MD [place] [address]  DIAGNOSIS:  Multiple myeloma diagnosed in May 2016  PRIOR THERAPY:  1) Curative radiotherapy to the tumor mass at the right anterior chest under the care of Dr. [name] completed in 12/27/2013. 2) Systemic chemotherapy with weekly subcutaneous Velcade 1.3 MG/M2, Revlimid 25 mg by mouth daily for 21 days every 4 weeks in addition to weekly Decadron 40 mg orally. Last dose of Velcade was giving 03/08/2015. This treatment was discontinued secondary to disease progression.  CURRENT THERAPY: Carfilzomib 20 MG/M2 initially first week followed by 36 MG/M2 on days 1, 2, 8, 9, 15 and 16 every 4 weeks in addition to Cytoxan 300 MG/M2 weekly as well as Decadron 40 mg on a weekly basis. First dose on 04/08/2015.  INTERVAL HISTORY: Wesley [name] 69 y.o. male returns to the clinic today for follow-up visit accompanied by his wife. The patient is feeling fine today with no specific complaints. The patient denied having any significant weakness. He denied having any chest pain, shortness of breath, cough or hemoptysis. Appetite is normal. He has no nausea or vomiting. He has minimal tingling in his hands and feet, no nail discoloration or open lesions. He has enlarging skull lesion on the frontal area, he wishes to have it evaluated by radiation. He is here today for D15 of Cycle 1 of chemotherapy.  MEDICAL HISTORY: Past Medical History  Diagnosis Date  . Pulmonary nodule, right     W/  CHEST WALL MASS--  SCHEDULED FOR BIOPSY W/ DR [name] ON FRIDAY 10-27-2013  . History of seizures as a child     AGE 17 OR 8--- NONE ISSUE SINCE  . History of GI bleed     SECONDARY TO ULCER --  DEC 2011  RESOLVED PER LAST EGD 07-05-2010  . Bladder tumor   . Hematuria   . Radiation 12/07/13-12/27/13    right anterior chest 37.5 gray  . Fracture of  radius 09/29/14    left  . Cancer ([facility])     multiple myeloma, bladder cancer 2015    ALLERGIES:  has No Known Allergies.  MEDICATIONS:  Current Outpatient Prescriptions  Medication Sig Dispense Refill  . acetaminophen (TYLENOL) 325 MG tablet Take 2 tablets (650 mg total) by mouth every 4 (four) hours as needed for mild pain, fever or headache. 30 tablet 0  . dexamethasone (DECADRON) 4 MG tablet 10 tablet by mouth every week starting with the first dose of chemotherapy. 80 tablet 2  . finasteride (PROSCAR) 5 MG tablet Take 1 tablet (5 mg total) by mouth daily. 30 tablet 1  . hydrALAZINE (APRESOLINE) 50 MG tablet Take 1 tablet (50 mg total) by mouth every 8 (eight) hours. 90 tablet 1  . HYDROcodone-acetaminophen (NORCO/VICODIN) 5-325 MG tablet Take 1-2 tablets by mouth every 4 (four) hours as needed for moderate pain. 65 tablet 0  . prochlorperazine (COMPAZINE) 10 MG tablet Take 1 tablet (10 mg total) by mouth every 6 (six) hours as needed for nausea or vomiting. 30 tablet 0  . tamsulosin (FLOMAX) 0.4 MG CAPS capsule Take 1 capsule (0.4 mg total) by mouth daily. (Patient taking differently: Take 0.4 mg by mouth daily. Take 2 pills per day one in the morning and 1 in the evening) 30 capsule 1   No current facility-administered medications for this visit.    SURGICAL HISTORY:  Past Surgical  History  Procedure Laterality Date  . Excision benign left axilla tumor  1993  . Esophagogastroduodenoscopy  X5   LAST ONE 07-05-2010  . Transurethral resection of bladder tumor with gyrus (turbt-gyrus) N/A 10/25/2013    Procedure: TRANSURETHRAL RESECTION OF BLADDER TUMOR WITH GYRUS (TURBT-GYRUS);  Surgeon: Sharyn Creamer, MD;  Location: [facility];  Service: Urology;  Laterality: N/A;  . Cystoscopy with biopsy N/A 10/25/2013    Procedure: CYSTOSCOPY;  Surgeon: Sharyn Creamer, MD;  Location: [facility];  Service: Urology;  Laterality: N/A;  . Bone biopsy Right 11/14/13       right seventh rib lesion  . Orif radial fracture Left 10/22/2014    Procedure: Intramedullary nailing left radius fracture;  Surgeon: Milly Jakob, MD;  Location: [facility];  Service: Orthopedics;  Laterality: Left;  OPEN TREATMENT LEFT RADIUS FRACTURE  . Cystoscopy with retrograde pyelogram, ureteroscopy and stent placement N/A 02/21/2015    Procedure: CYSTOSCOPY, EXAM UNDER ANESTHESIA, FOLEY CATHETER PLACEMENT;  Surgeon: Festus Aloe, MD;  Location: [facility];  Service: Urology;  Laterality: N/A;    REVIEW OF SYSTEMS:  Constitutional: positive for fatigue Eyes: negative Ears, nose, mouth, throat, and face: negative Respiratory: negative Cardiovascular: negative Gastrointestinal: negative Genitourinary:negative Integument/breast: negative Hematologic/lymphatic: negative Musculoskeletal:positive for bone pain Neurological: negative Behavioral/Psych: negative Endocrine: negative Allergic/Immunologic: negative   PHYSICAL EXAMINATION: General appearance: alert, cooperative and no distress Head: Normocephalic, he has enlarging skull lesion on the frontal area, atraumatic Neck: no adenopathy, no carotid bruit, supple, symmetrical, trachea midline and thyroid not enlarged, symmetric, no tenderness/mass/nodules Lymph nodes: Cervical, supraclavicular, and axillary nodes normal. Resp: clear to auscultation bilaterally Back: symmetric, no curvature. ROM normal. No CVA tenderness. Cardio: regular rate and rhythm, S1, S2 normal, no murmur, click, rub or gallop GI: soft, non-tender; bowel sounds normal; no masses,  no organomegaly  Extremities: extremities normal, atraumatic, no cyanosis, minimal peripheral edema at the ankles. Neurologic: Alert and oriented X 3, normal strength and tone. Normal symmetric reflexes. Normal coordination and gait  ECOG PERFORMANCE STATUS: 1 - Symptomatic but completely ambulatory  Blood pressure 172/93, pulse 97, temperature 97.9 F (36.6  C), temperature source Oral, resp. rate 18, height _0  (1.727 m), weight 206 lb 1.6 oz (93.486 kg), SpO2 100 %.  LABORATORY DATA: CBC Latest Ref Rng 04/22/2015 04/15/2015 04/08/2015  WBC 4.0 - 10.3 10e3/uL 5.2 5.5 3.8(L)  Hemoglobin 13.0 - 17.1 g/dL 8.7(L) 9.7(L) 7.4(L)  Hematocrit 38.4 - 49.9 % 26.3(L) 29.7(L) 22.2(L)  Platelets 140 - 400 10e3/uL 101(L) 135(L) 162   CMP Latest Ref Rng 04/22/2015 04/15/2015 04/08/2015  Glucose 70 - 140 mg/dl 105 118 121  BUN 7.0 - 26.0 mg/dL 28.9(H) 30.9(H) 22.9  Creatinine 0.7 - 1.3 mg/dL 2.5(H) 2.4(H) 2.2(H)  Sodium 136 - 145 mEq/L 140 143 140  Potassium 3.5 - 5.1 mEq/L 4.2 4.4 4.1  Chloride 101 - 111 mmol/L - - -  CO2 22 - 29 mEq/L 19(L) 21(L) 21(L)  Calcium 8.4 - 10.4 mg/dL 8.8 9.0 8.9  Total Protein 6.4 - 8.3 g/dL 7.2 7.7 7.1  Total Bilirubin 0.20 - 1.20 mg/dL 1.14 0.91 0.67  Alkaline Phos 40 - 150 U/L 64 73 68  AST 5 - 34 U/L _1 ALT 0 - 55 U/L _2 IgA 27  K lt free chain 71.1 IgM 28                                                                                  Lambda free light chain 1.03 IgG 1600                                                                              K/L ratio 69. 03 B-2 Microglobulin 4.61                                                        LDH 205  RADIOGRAPHIC STUDIES: Ct Head Wo Contrast  03/24/2015  CLINICAL DATA:  Multiple myeloma.  Skull lesion on exam. EXAM: CT HEAD WITHOUT CONTRAST TECHNIQUE: Contiguous axial images were obtained from the base of the skull through the vertex without intravenous contrast. COMPARISON:  None. FINDINGS: Mild atrophy. Negative for hydrocephalus. Negative for acute or chronic infarction. Negative for intracranial hemorrhage. Destructive lesion in the right frontal bone. Soft tissue mass extends into the scalp as well as posteriorly through the inner table. Margins are ill-defined and this  is most compatible with neoplasm. There is a similar destructive lesion in the frontal bone involving the frontal sinus in the midline. There is associated hyperdense soft tissue mass extending into the anterior subarachnoid space and into the frontal sinus. There is bony destruction with ill-defined margins. There is low-density in the frontal sinus which is most likely a mucocele adjacent to the mass lesion. No other skull lesions. IMPRESSION: Right frontal skull lesion with bony destruction. There is a second lesion involving the frontal sinus with soft tissue mass and bony destruction. Findings are most consistent with metastatic disease or myeloma. There is a probable mucocele in the frontal sinus. No acute abnormality of the brain. Electronically Signed   By: Franchot Gallo M.D.   On: 03/24/2015 15:31    ASSESSMENT AND PLAN: This is a very pleasant 69 years old African-American male with recently diagnosed multiple myeloma.  I had a lengthy discussion with the patient and his wife about his current condition and treatment options. The patient was started on treatment with subcutaneous Velcade, Revlimid and Decadron for around 4 months but this was discontinued secondary to disease progression with increase in the retroperitoneal mass in addition to new lytic bone lesions. 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 His treatment with Velcade, Revlimid and Decadron was then discontinued. He began treatment with Carfilzomib (Kyprolis), Cytoxan and Decadron as a second line option, first dose on 12/5, today day 15 of cycle 1. He is tolerating it well without significant side effects He will return in January 3 with Dr.  Mohamed prior to his scheduled chemo on 1/3 and 1/4   He was again advised to take Decadron every Monday even on his off week. He was advised to call if he has any concerning symptoms in the interval. The patient  voices understanding of current disease status and treatment options and is in agreement with the current care plan.  All questions were answered. The patient knows to call the clinic with any problems, questions or concerns. We can certainly see the patient much sooner if necessary.  I spent 20 minutes counseling the patient face to face. The total time spent in the appointment was 30 minutes.  ADDENDUM: Hematology/Oncology Attending: I had a face to face encounter with the patient today. I recommended his care plan. This is a very pleasant 69 years old African-American male with progressive multiple myeloma as well as chronic renal insufficiency who is currently undergoing second line treatment with Carfilzomib, Cytoxan and Decadron status post 2 weeks of treatment. The patient has been tolerating his treatment fairly well with no significant adverse effects. The patient has lytic bone lesion in the frontal part of the skull that was getting large. I recommended for him referral to Dr. [name] for consideration of palliative radiotherapy to this area. I also recommended for the patient to continue his current treatment with day 15 and 16 of this cycle as a scheduled. He would come back for follow-up visit in 2 weeks for reevaluation before starting cycle #2. He was advised to call immediately if he has any concerning symptoms in the interval.  Disclaimer: This note was dictated with voice recognition software. Similar sounding words can inadvertently be transcribed and may be missed upon review. Eilleen Kempf., MD 04/22/2015

## 2015-04-22 NOTE — Patient Instructions (Signed)
[facility] Discharge Instructions for Patients Receiving Chemotherapy  Today you received the following chemotherapy agents: Cytoxan and Kyprolis. To help prevent nausea and vomiting after your treatment, we encourage you to take your nausea medication: Compazine 10 mg every 6 hours as needed.   If you develop nausea and vomiting that is not controlled by your nausea medication, call the clinic.   BELOW ARE SYMPTOMS THAT SHOULD BE REPORTED IMMEDIATELY:  *FEVER GREATER THAN 100.5 F  *CHILLS WITH OR WITHOUT FEVER  NAUSEA AND VOMITING THAT IS NOT CONTROLLED WITH YOUR NAUSEA MEDICATION  *UNUSUAL SHORTNESS OF BREATH  *UNUSUAL BRUISING OR BLEEDING  TENDERNESS IN MOUTH AND THROAT WITH OR WITHOUT PRESENCE OF ULCERS  *URINARY PROBLEMS  *BOWEL PROBLEMS  UNUSUAL RASH Items with * indicate a potential emergency and should be followed up as soon as possible.  Feel free to call the clinic you have any questions or concerns. The clinic phone number is [phone].  Please show the [facility] at check-in to the Emergency Department and triage nurse.

## 2015-04-22 NOTE — Progress Notes (Signed)
Reviewed labs. Noted creatinine to be 2.5.  Ok to treat per Dr. [name].

## 2015-04-23 ENCOUNTER — Ambulatory Visit: Payer: Medicare HMO | Admitting: Physician Assistant

## 2015-04-23 ENCOUNTER — Ambulatory Visit (HOSPITAL_BASED_OUTPATIENT_CLINIC_OR_DEPARTMENT_OTHER): Payer: Medicare HMO

## 2015-04-23 VITALS — BP 157/81 | HR 100 | Temp 97.7°F | Resp 19

## 2015-04-23 DIAGNOSIS — Z5112 Encounter for antineoplastic immunotherapy: Secondary | ICD-10-CM

## 2015-04-23 DIAGNOSIS — C9000 Multiple myeloma not having achieved remission: Secondary | ICD-10-CM

## 2015-04-23 DIAGNOSIS — N08 Glomerular disorders in diseases classified elsewhere: Principal | ICD-10-CM

## 2015-04-23 MED ORDER — SODIUM CHLORIDE 0.9 % IV SOLN
Freq: Once | INTRAVENOUS | Status: AC
  Administered 2015-04-23: 11:00:00 via INTRAVENOUS

## 2015-04-23 MED ORDER — SODIUM CHLORIDE 0.9 % IV SOLN
Freq: Once | INTRAVENOUS | Status: AC
  Administered 2015-04-23: 11:00:00 via INTRAVENOUS
  Filled 2015-04-23: qty 4

## 2015-04-23 MED ORDER — DEXTROSE 5 % IV SOLN
36.0000 mg/m2 | Freq: Once | INTRAVENOUS | Status: AC
  Administered 2015-04-23: 76 mg via INTRAVENOUS
  Filled 2015-04-23: qty 38

## 2015-04-23 NOTE — Patient Instructions (Signed)
[facility] Discharge Instructions for Patients Receiving Chemotherapy  Today you received the following chemotherapy agents: Kyprolis   To help prevent nausea and vomiting after your treatment, we encourage you to take your nausea medication as directed.    If you develop nausea and vomiting that is not controlled by your nausea medication, call the clinic.   BELOW ARE SYMPTOMS THAT SHOULD BE REPORTED IMMEDIATELY:  *FEVER GREATER THAN 100.5 F  *CHILLS WITH OR WITHOUT FEVER  NAUSEA AND VOMITING THAT IS NOT CONTROLLED WITH YOUR NAUSEA MEDICATION  *UNUSUAL SHORTNESS OF BREATH  *UNUSUAL BRUISING OR BLEEDING  TENDERNESS IN MOUTH AND THROAT WITH OR WITHOUT PRESENCE OF ULCERS  *URINARY PROBLEMS  *BOWEL PROBLEMS  UNUSUAL RASH Items with * indicate a potential emergency and should be followed up as soon as possible.  Feel free to call the clinic you have any questions or concerns. The clinic phone number is [phone].  Please show the CHEMO ALERT CARD at check-in to the Emergency Department and triage nurse.   

## 2015-05-08 ENCOUNTER — Other Ambulatory Visit: Payer: Self-pay | Admitting: Internal Medicine

## 2015-05-08 ENCOUNTER — Ambulatory Visit (HOSPITAL_BASED_OUTPATIENT_CLINIC_OR_DEPARTMENT_OTHER): Payer: Medicare HMO

## 2015-05-08 ENCOUNTER — Ambulatory Visit (HOSPITAL_BASED_OUTPATIENT_CLINIC_OR_DEPARTMENT_OTHER): Payer: Medicare HMO | Admitting: Physician Assistant

## 2015-05-08 ENCOUNTER — Other Ambulatory Visit (HOSPITAL_BASED_OUTPATIENT_CLINIC_OR_DEPARTMENT_OTHER): Payer: Medicare HMO

## 2015-05-08 ENCOUNTER — Telehealth: Payer: Self-pay | Admitting: Internal Medicine

## 2015-05-08 VITALS — BP 156/98 | HR 99 | Temp 98.7°F | Resp 18 | Ht 68.0 in | Wt 191.9 lb

## 2015-05-08 DIAGNOSIS — Z5111 Encounter for antineoplastic chemotherapy: Secondary | ICD-10-CM

## 2015-05-08 DIAGNOSIS — Z5112 Encounter for antineoplastic immunotherapy: Secondary | ICD-10-CM | POA: Diagnosis not present

## 2015-05-08 DIAGNOSIS — C9000 Multiple myeloma not having achieved remission: Secondary | ICD-10-CM

## 2015-05-08 DIAGNOSIS — N08 Glomerular disorders in diseases classified elsewhere: Principal | ICD-10-CM

## 2015-05-08 DIAGNOSIS — D6481 Anemia due to antineoplastic chemotherapy: Secondary | ICD-10-CM

## 2015-05-08 DIAGNOSIS — N2889 Other specified disorders of kidney and ureter: Secondary | ICD-10-CM | POA: Diagnosis not present

## 2015-05-08 DIAGNOSIS — N185 Chronic kidney disease, stage 5: Secondary | ICD-10-CM

## 2015-05-08 DIAGNOSIS — T451X5A Adverse effect of antineoplastic and immunosuppressive drugs, initial encounter: Secondary | ICD-10-CM

## 2015-05-08 LAB — CBC WITH DIFFERENTIAL/PLATELET
BASO%: 0.4 % (ref 0.0–2.0)
BASOS ABS: 0 10*3/uL (ref 0.0–0.1)
EOS%: 1.7 % (ref 0.0–7.0)
Eosinophils Absolute: 0.1 10*3/uL (ref 0.0–0.5)
HEMATOCRIT: 26.5 % — AB (ref 38.4–49.9)
HGB: 8.7 g/dL — ABNORMAL LOW (ref 13.0–17.1)
LYMPH#: 0.5 10*3/uL — AB (ref 0.9–3.3)
LYMPH%: 11.3 % — AB (ref 14.0–49.0)
MCH: 31.4 pg (ref 27.2–33.4)
MCHC: 32.8 g/dL (ref 32.0–36.0)
MCV: 95.8 fL (ref 79.3–98.0)
MONO#: 1.1 10*3/uL — ABNORMAL HIGH (ref 0.1–0.9)
MONO%: 22.2 % — ABNORMAL HIGH (ref 0.0–14.0)
NEUT#: 3.1 10*3/uL (ref 1.5–6.5)
NEUT%: 64.4 % (ref 39.0–75.0)
PLATELETS: 152 10*3/uL (ref 140–400)
RBC: 2.77 10*6/uL — AB (ref 4.20–5.82)
RDW: 18.2 % — ABNORMAL HIGH (ref 11.0–14.6)
WBC: 4.8 10*3/uL (ref 4.0–10.3)

## 2015-05-08 LAB — COMPREHENSIVE METABOLIC PANEL
ALK PHOS: 60 U/L (ref 40–150)
ALT: 12 U/L (ref 0–55)
ANION GAP: 10 meq/L (ref 3–11)
AST: 16 U/L (ref 5–34)
Albumin: 3.8 g/dL (ref 3.5–5.0)
BILIRUBIN TOTAL: 1.41 mg/dL — AB (ref 0.20–1.20)
BUN: 27.9 mg/dL — ABNORMAL HIGH (ref 7.0–26.0)
CALCIUM: 9.2 mg/dL (ref 8.4–10.4)
CO2: 19 mEq/L — ABNORMAL LOW (ref 22–29)
CREATININE: 2.4 mg/dL — AB (ref 0.7–1.3)
Chloride: 105 mEq/L (ref 98–109)
EGFR: 31 mL/min/{1.73_m2} — ABNORMAL LOW (ref >90)
Glucose: 101 mg/dl (ref 70–140)
Potassium: 4.4 mEq/L (ref 3.5–5.1)
Sodium: 135 mEq/L — ABNORMAL LOW (ref 136–145)
TOTAL PROTEIN: 8.3 g/dL (ref 6.4–8.3)

## 2015-05-08 MED ORDER — SODIUM CHLORIDE 0.9 % IV SOLN
Freq: Once | INTRAVENOUS | Status: AC
  Administered 2015-05-08: 15:00:00 via INTRAVENOUS

## 2015-05-08 MED ORDER — SODIUM CHLORIDE 0.9 % IV SOLN
300.0000 mg/m2 | Freq: Once | INTRAVENOUS | Status: AC
  Administered 2015-05-08: 620 mg via INTRAVENOUS
  Filled 2015-05-08: qty 31

## 2015-05-08 MED ORDER — SODIUM CHLORIDE 0.9 % IV SOLN
Freq: Once | INTRAVENOUS | Status: AC
  Administered 2015-05-08: 16:00:00 via INTRAVENOUS

## 2015-05-08 MED ORDER — HYDRALAZINE HCL 50 MG PO TABS: 50.0000 mg | ORAL_TABLET | Freq: Three times a day (TID) | ORAL | Status: DC

## 2015-05-08 MED ORDER — CARFILZOMIB CHEMO INJECTION 60 MG
36.0000 mg/m2 | Freq: Once | INTRAVENOUS | Status: AC
  Administered 2015-05-08: 76 mg via INTRAVENOUS
  Filled 2015-05-08: qty 38

## 2015-05-08 MED ORDER — SODIUM CHLORIDE 0.9 % IV SOLN
Freq: Once | INTRAVENOUS | Status: AC
  Administered 2015-05-08: 17:00:00 via INTRAVENOUS
  Filled 2015-05-08: qty 4

## 2015-05-08 NOTE — Progress Notes (Signed)
Reviewed labs with Dr. [name] ( Creatinine 2.4, Bilirubin 1.41), okay to treat per Dr . [name].

## 2015-05-08 NOTE — Telephone Encounter (Signed)
Gv pt appts for Jan + Feb. °

## 2015-05-08 NOTE — Patient Instructions (Signed)
[facility] Discharge Instructions for Patients Receiving Chemotherapy  Today you received the following chemotherapy agents: Cytoxan, Kyprolis   To help prevent nausea and vomiting after your treatment, we encourage you to take your nausea medication as directed.    If you develop nausea and vomiting that is not controlled by your nausea medication, call the clinic.   BELOW ARE SYMPTOMS THAT SHOULD BE REPORTED IMMEDIATELY:  *FEVER GREATER THAN 100.5 F  *CHILLS WITH OR WITHOUT FEVER  NAUSEA AND VOMITING THAT IS NOT CONTROLLED WITH YOUR NAUSEA MEDICATION  *UNUSUAL SHORTNESS OF BREATH  *UNUSUAL BRUISING OR BLEEDING  TENDERNESS IN MOUTH AND THROAT WITH OR WITHOUT PRESENCE OF ULCERS  *URINARY PROBLEMS  *BOWEL PROBLEMS  UNUSUAL RASH Items with * indicate a potential emergency and should be followed up as soon as possible.  Feel free to call the clinic you have any questions or concerns. The clinic phone number is [phone].  Please show the CHEMO ALERT CARD at check-in to the Emergency Department and triage nurse.   

## 2015-05-08 NOTE — Progress Notes (Signed)
[facility] [phone]   [phone]  OFFICE PROGRESS NOTE  Wesley Cowden, MD [place] [address]  DIAGNOSIS:  Multiple myeloma diagnosed in May 2016  PRIOR THERAPY:  1) Curative radiotherapy to the tumor mass at the right anterior chest under the care of Dr. [name] completed in 12/27/2013. 2) Systemic chemotherapy with weekly subcutaneous Velcade 1.3 MG/M2, Revlimid 25 mg by mouth daily for 21 days every 4 weeks in addition to weekly Decadron 40 mg orally. Last dose of Velcade was giving 03/08/2015. This treatment was discontinued secondary to disease progression.  CURRENT THERAPY: Carfilzomib 20 MG/M2 initially first week followed by 36 MG/M2 on days 1, 2, 8, 9, 15 and 16 every 4 weeks in addition to Cytoxan 300 MG/M2 weekly as well as Decadron 40 mg on a weekly basis. First dose on 04/08/2015.  INTERVAL HISTORY: Wesley [name] 70 y.o. male returns to the clinic today for follow-up visit. He is recovering from a cold and cough. The patient denied having any significant weakness. He denied having any chest pain, shortness of breath, or hemoptysis. Appetite is normal. He has no nausea or vomiting. He has minimal tingling in his hands and feet, no nail discoloration or open lesions. He has enlarging skull lesion on the frontal areato be evaluated by radiation tomorrow. He is here today for D1 of Cycle 2 of chemotherapy.  MEDICAL HISTORY: Past Medical History  Diagnosis Date  . Pulmonary nodule, right     W/  CHEST WALL MASS--  SCHEDULED FOR BIOPSY W/ DR [name] ON FRIDAY 10-27-2013  . History of seizures as a child     AGE 68 OR 8--- NONE ISSUE SINCE  . History of GI bleed     SECONDARY TO ULCER --  DEC 2011  RESOLVED PER LAST EGD 07-05-2010  . Bladder tumor   . Hematuria   . Radiation 12/07/13-12/27/13    right anterior chest 37.5 gray  . Fracture of radius 09/29/14    left  . Cancer ([place])     multiple myeloma,  bladder cancer 2015    ALLERGIES:  has No Known Allergies.  MEDICATIONS:  Current Outpatient Prescriptions  Medication Sig Dispense Refill  . acetaminophen (TYLENOL) 325 MG tablet Take 2 tablets (650 mg total) by mouth every 4 (four) hours as needed for mild pain, fever or headache. 30 tablet 0  . dexamethasone (DECADRON) 4 MG tablet 10 tablet by mouth every week starting with the first dose of chemotherapy. 80 tablet 2  . finasteride (PROSCAR) 5 MG tablet Take 1 tablet (5 mg total) by mouth daily. 30 tablet 1  . hydrALAZINE (APRESOLINE) 50 MG tablet Take 1 tablet (50 mg total) by mouth every 8 (eight) hours. 90 tablet 1  . HYDROcodone-acetaminophen (NORCO/VICODIN) 5-325 MG tablet Take 1-2 tablets by mouth every 4 (four) hours as needed for moderate pain. 65 tablet 0  . prochlorperazine (COMPAZINE) 10 MG tablet Take 1 tablet (10 mg total) by mouth every 6 (six) hours as needed for nausea or vomiting. 30 tablet 0  . tamsulosin (FLOMAX) 0.4 MG CAPS capsule Take 1 capsule (0.4 mg total) by mouth daily. (Patient taking differently: Take 0.4 mg by mouth daily. Take 2 pills per day one in the morning and 1 in the evening) 30 capsule 1   No current facility-administered medications for this visit.    SURGICAL HISTORY:  Past Surgical History  Procedure Laterality Date  . Excision benign left  axilla tumor  1993  . Esophagogastroduodenoscopy  X5   LAST ONE 07-05-2010  . Transurethral resection of bladder tumor with gyrus (turbt-gyrus) N/A 10/25/2013    Procedure: TRANSURETHRAL RESECTION OF BLADDER TUMOR WITH GYRUS (TURBT-GYRUS);  Surgeon: Sharyn Creamer, MD;  Location: [facility];  Service: Urology;  Laterality: N/A;  . Cystoscopy with biopsy N/A 10/25/2013    Procedure: CYSTOSCOPY;  Surgeon: Sharyn Creamer, MD;  Location[facility];  Service: Urology;  Laterality: N/A;  . Bone biopsy Right 11/14/13     right seventh rib lesion  . Orif radial fracture Left  10/22/2014    Procedure: Intramedullary nailing left radius fracture;  Surgeon: Milly Jakob, MD;  Location: [facility];  Service: Orthopedics;  Laterality: Left;  OPEN TREATMENT LEFT RADIUS FRACTURE  . Cystoscopy with retrograde pyelogram, ureteroscopy and stent placement N/A 02/21/2015    Procedure: CYSTOSCOPY, EXAM UNDER ANESTHESIA, FOLEY CATHETER PLACEMENT;  Surgeon: Festus Aloe, MD;  Location: [facility];  Service: Urology;  Laterality: N/A;    REVIEW OF SYSTEMS:  Constitutional: positive for fatigue Eyes: negative Ears, nose, mouth, throat, and face: negative Respiratory: negative Cardiovascular: negative Gastrointestinal: negative Genitourinary:negative Integument/breast: negative Hematologic/lymphatic: negative Musculoskeletal:positive for bone pain Neurological: negative Behavioral/Psych: negative Endocrine: negative Allergic/Immunologic: negative   PHYSICAL EXAMINATION: General appearance: alert, cooperative and no distress Head: Normocephalic, he has enlarging skull lesion on the frontal area, atraumatic Neck: no adenopathy, no carotid bruit, supple, symmetrical, trachea midline and thyroid not enlarged, symmetric, no tenderness/mass/nodules Lymph nodes: Cervical, supraclavicular, and axillary nodes normal. Resp: clear to auscultation bilaterally Back: symmetric, no curvature. ROM normal. No CVA tenderness. Cardio: regular rate and rhythm, S1, S2 normal, no murmur, click, rub or gallop GI: soft, non-tender; bowel sounds normal; no masses,  no organomegaly  Extremities: extremities normal, atraumatic, no cyanosis, minimal peripheral edema at the ankles. Neurologic: Alert and oriented X 3, normal strength and tone. Normal symmetric reflexes. Normal coordination and gait  ECOG PERFORMANCE STATUS: 1 - Symptomatic but completely ambulatory  Blood pressure 156/98, pulse 99, temperature 98.7 F (37.1 C), temperature source Oral, resp. rate 18, height 5' 8"  (1.727 m), weight 191 lb 14.4 oz (87.045 kg), SpO2 99 %.  LABORATORY DATA: CBC Latest Ref Rng 05/08/2015 04/22/2015 04/15/2015  WBC 4.0 - 10.3 10e3/uL 4.8 5.2 5.5  Hemoglobin 13.0 - 17.1 g/dL 8.7(L) 8.7(L) 9.7(L)  Hematocrit 38.4 - 49.9 % 26.5(L) 26.3(L) 29.7(L)  Platelets 140 - 400 10e3/uL 152 101(L) 135(L)   CMP Latest Ref Rng 05/08/2015 04/22/2015 04/15/2015  Glucose 70 - 140 mg/dl 101 105 118  BUN 7.0 - 26.0 mg/dL 27.9(H) 28.9(H) 30.9(H)  Creatinine 0.7 - 1.3 mg/dL 2.4(H) 2.5(H) 2.4(H)  Sodium 136 - 145 mEq/L 135(L) 140 143  Potassium 3.5 - 5.1 mEq/L 4.4 4.2 4.4  Chloride 101 - 111 mmol/L - - -  CO2 22 - 29 mEq/L 19(L) 19(L) 21(L)  Calcium 8.4 - 10.4 mg/dL 9.2 8.8 9.0  Total Protein 6.4 - 8.3 g/dL 8.3 7.2 7.7  Total Bilirubin 0.20 - 1.20 mg/dL 1.41(H) 1.14 0.91  Alkaline Phos 40 - 150 U/L 60 64 73  AST 5 - 34 U/L _0 ALT 0 - 55 U/L _1 IgA 27  K lt free chain 71.1 IgM 28                                                                                  Lambda free light chain 1.03 IgG 1600                                                                              K/L ratio 69. 03 B-2 Microglobulin 4.61                                                        LDH 205  RADIOGRAPHIC STUDIES: No results found.  ASSESSMENT AND PLAN: This is a very pleasant 70 years old African-American male with recently diagnosed multiple myeloma.  I had a lengthy discussion with the patient and his wife about his current condition and treatment options. The patient was started on treatment with subcutaneous Velcade, Revlimid and Decadron for around 4 months but this was discontinued secondary to disease progression with increase in the retroperitoneal mass in addition to new lytic bone lesions. 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 His  treatment with Velcade, Revlimid and Decadron was then discontinued. He began treatment with Carfilzomib (Kyprolis), Cytoxan and Decadron as a second line option, first dose on 12/5, today for Day 1 Cycle 2 He is tolerating it well without significant side effects. Will continue to monitor his renal functions  Of note, he was found to have a lytic bone lesion in the frontal part of the skull that was getting large. Recommended for him referral to Dr. [name] for consideration of palliative radiotherapy to this area.He is scheduled to see him on 05/09/15 He will return in 4 weeks prior to day 1 C3 He was again advised to take Decadron every Monday even on his off week. For his respiratory symptoms, Claritin and Tylenol to treat symptomaticaly He was advised to call if he has any concerning symptoms in the interval. The patient voices understanding of current disease status and treatment options and is in agreement with the current care plan.  All questions were answered. The patient knows to call the clinic with any problems, questions or concerns. We can certainly see the patient much sooner if necessary.    Rondel Jumbo, PA-C 05/08/2015  ADDENDUM: Hematology/Oncology Attending: I had a face to face encounter with the patient. I recommended his care plan. This is a very pleasant 70 years old African-American male with multiple myeloma currently undergoing treatment with Carfilzomib, Cytoxan and Decadron. He is status post 1 cycle of this regimen and tolerated his treatment fairly well with no significant adverse effects. I recommended for the patient to proceed with  cycle #2 today as a scheduled. He would come back for follow-up visit in 4 weeks for reevaluation before starting cycle #3 of his treatment. For the lytic lesion in the skull, I referred the patient to Dr. [name] for consideration of palliative radiotherapy to this area. The patient was advised to call immediately if he has any  concerning symptoms in the interval.  Disclaimer: This note was dictated with voice recognition software. Similar sounding words can inadvertently be transcribed and may be missed upon review.  Eilleen Kempf., MD 05/12/2015

## 2015-05-08 NOTE — Telephone Encounter (Signed)
Spoke to pt's wife Pricilla Holm, told her Rx for Hydralazine was sent to pharmacy. Pricilla Holm verbalized understanding.

## 2015-05-08 NOTE — Telephone Encounter (Signed)
Santiago Glad from the [facility] called requesting a RX refill on hydrALAZINE (APRESOLINE) 50 MG tablet    Pharmacy: [facility] on [address]

## 2015-05-09 ENCOUNTER — Ambulatory Visit
Admission: RE | Admit: 2015-05-09 | Discharge: 2015-05-09 | Disposition: A | Discharge status: Home / Self Care | Payer: Medicare HMO | Source: Ambulatory Visit | Attending: Radiation Oncology | Admitting: Radiation Oncology

## 2015-05-09 ENCOUNTER — Ambulatory Visit (HOSPITAL_BASED_OUTPATIENT_CLINIC_OR_DEPARTMENT_OTHER): Payer: Medicare HMO

## 2015-05-09 ENCOUNTER — Telehealth: Payer: Self-pay | Admitting: Internal Medicine

## 2015-05-09 VITALS — BP 155/102 | HR 98 | Temp 97.5°F | Ht 68.0 in | Wt 195.2 lb

## 2015-05-09 VITALS — BP 156/91 | HR 99 | Temp 98.0°F | Resp 19

## 2015-05-09 DIAGNOSIS — N08 Glomerular disorders in diseases classified elsewhere: Principal | ICD-10-CM

## 2015-05-09 DIAGNOSIS — M899 Disorder of bone, unspecified: Secondary | ICD-10-CM | POA: Diagnosis not present

## 2015-05-09 DIAGNOSIS — Z5112 Encounter for antineoplastic immunotherapy: Secondary | ICD-10-CM

## 2015-05-09 DIAGNOSIS — C9000 Multiple myeloma not having achieved remission: Secondary | ICD-10-CM | POA: Diagnosis not present

## 2015-05-09 DIAGNOSIS — Z923 Personal history of irradiation: Secondary | ICD-10-CM | POA: Diagnosis not present

## 2015-05-09 DIAGNOSIS — Z510 Encounter for antineoplastic radiation therapy: Secondary | ICD-10-CM | POA: Diagnosis present

## 2015-05-09 MED ORDER — SODIUM CHLORIDE 0.9 % IV SOLN
Freq: Once | INTRAVENOUS | Status: AC
  Administered 2015-05-09: 15:00:00 via INTRAVENOUS

## 2015-05-09 MED ORDER — DEXAMETHASONE SODIUM PHOSPHATE 100 MG/10ML IJ SOLN
Freq: Once | INTRAMUSCULAR | Status: AC
  Administered 2015-05-09: 15:00:00 via INTRAVENOUS
  Filled 2015-05-09: qty 4

## 2015-05-09 MED ORDER — DEXTROSE 5 % IV SOLN
36.0000 mg/m2 | Freq: Once | INTRAVENOUS | Status: AC
  Administered 2015-05-09: 76 mg via INTRAVENOUS
  Filled 2015-05-09: qty 38

## 2015-05-09 MED ORDER — SODIUM CHLORIDE 0.9 % IV SOLN
Freq: Once | INTRAVENOUS | Status: AC
  Administered 2015-05-09: 14:00:00 via INTRAVENOUS

## 2015-05-09 NOTE — Patient Instructions (Signed)
[facility] Discharge Instructions for Patients Receiving Chemotherapy  Today you received the following chemotherapy agents: Kyprolis   To help prevent nausea and vomiting after your treatment, we encourage you to take your nausea medication as directed.    If you develop nausea and vomiting that is not controlled by your nausea medication, call the clinic.   BELOW ARE SYMPTOMS THAT SHOULD BE REPORTED IMMEDIATELY:  *FEVER GREATER THAN 100.5 F  *CHILLS WITH OR WITHOUT FEVER  NAUSEA AND VOMITING THAT IS NOT CONTROLLED WITH YOUR NAUSEA MEDICATION  *UNUSUAL SHORTNESS OF BREATH  *UNUSUAL BRUISING OR BLEEDING  TENDERNESS IN MOUTH AND THROAT WITH OR WITHOUT PRESENCE OF ULCERS  *URINARY PROBLEMS  *BOWEL PROBLEMS  UNUSUAL RASH Items with * indicate a potential emergency and should be followed up as soon as possible.  Feel free to call the clinic you have any questions or concerns. The clinic phone number is [phone].  Please show the CHEMO ALERT CARD at check-in to the Emergency Department and triage nurse.   

## 2015-05-09 NOTE — Telephone Encounter (Signed)
Pt's wife came in asking to move his wed/tues appts back to mon/tues. The schedule was moved due to the holidays. Advised pt's wife I would send a message to md to advise how pt can safely be moved back to mon/tues. Moved chemo appts from pm to am on 1/11 + 1/12.

## 2015-05-09 NOTE — Progress Notes (Signed)
Histology and Location of Primary Cancer: Multiple myeloma   Location(s) of Symptomatic Metastases: lytic bone lesion in the frontal part of the skull.   Past/Anticipated chemotherapy by medical oncology, if any: Systemic chemotherapy with weekly subcutaneous Velcade 1.3 MG/M2, Revlimid 25 mg by mouth daily for 21 days every 4 weeks in addition to weekly Decadron 40 mg orally. Last dose of Velcade was giving 03/08/2015. This treatment was discontinued secondary to disease progression. CURRENT THERAPY: Carfilzomib 20 MG/M2 initially first week followed by 36 MG/M2 on days 1, 2, 8, 9, 15 and 16 every 4 weeks in addition to Cytoxan 300 MG/M2 weekly as well as Decadron 40 mg on a weekly basis. First dose on 04/08/2015.   Pain on a scale of 0-10 is: He denies any pain at this time    Ambulatory status? He is able to walk independently.   SAFETY ISSUES:  Prior radiation? 12/07/13-12/27/13 - right anterior chest 37.5 gray  Pacemaker/ICD? No  Possible current pregnancy? no  Is the patient on methotrexate? No  Current Complaints / other details:  He has no other concerns.   BP 155/102 mmHg  Pulse 98  Temp(Src) 97.5 F (36.4 C)  Ht '5\' 8"'$  (1.727 m)  Wt 195 lb 3.2 oz (88.542 kg)  BMI 29.69 kg/m2   Wt Readings from Last 3 Encounters:  05/09/15 195 lb 3.2 oz (88.542 kg)  05/08/15 191 lb 14.4 oz (87.045 kg)  04/22/15 206 lb 1.6 oz (93.486 kg)

## 2015-05-09 NOTE — Progress Notes (Signed)
Radiation Oncology         [phone] ________________________________  Name: Wesley [name] MRN: [id number]  Date: 05/09/2015  DOB: December 07, 1945  Reconsult  Note  CC: [name], MD  [name], MD    ICD-9-CM ICD-10-CM   1. 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 Denies hematuria and headaches. Reports intermittent pain in his right chest wall, dependent on how much he eats. He first noticed swelling of the frontal part of his skull "sometime in December".  The patient is anxious about the side effects of skull radiation. Denies any pain at this time. Patient states that he has a cold. Denies any nausea                               ALLERGIES:  has No Known Allergies.  Meds: Current Outpatient Prescriptions  Medication Sig Dispense Refill  . acetaminophen (TYLENOL) 325 MG tablet Take 2 tablets (650 mg total) by mouth every 4 (four) hours as needed for mild pain, fever or headache. 30 tablet 0  . dexamethasone (DECADRON) 4 MG tablet 10 tablet by mouth every week starting with the first dose of chemotherapy. 80 tablet 2  . finasteride (PROSCAR) 5 MG tablet Take 1 tablet (5 mg total) by mouth daily. 30 tablet 1  . hydrALAZINE (APRESOLINE) 50 MG tablet Take 1 tablet (50 mg total) by mouth every 8 (eight) hours. 90 tablet 1  . prochlorperazine (COMPAZINE) 10 MG tablet Take 1 tablet (10 mg total) by mouth every 6 (six) hours as needed for nausea or vomiting. 30 tablet 0  . tamsulosin (FLOMAX) 0.4 MG CAPS capsule Take 1 capsule (0.4 mg total) by mouth daily. (Patient taking differently: Take 0.4 mg by mouth daily. Take 2 pills per day one in the morning and 1 in the evening) 30 capsule 1  . HYDROcodone-acetaminophen (NORCO/VICODIN) 5-325 MG  tablet Take 1-2 tablets by mouth every 4 (four) hours as needed for moderate pain. (Patient not taking: Reported on 05/09/2015) 65 tablet 0   No current facility-administered medications for this encounter.    Physical Findings: The patient is in no acute distress. Patient is alert and oriented.  height is '5\' 8"'$  (1.727 m) and weight is 195 lb 3.2 oz (88.542 kg). His temperature is 97.5 F (36.4 C). His blood pressure is 155/102 and his pulse is 98. .    General: Alert and oriented, in no acute distress HEENT: Head is normocephalic. Extraocular movements are intact. Oropharynx is clear. Neck: Neck is supple, no palpable cervical or supraclavicular lymphadenopathy. Heart: Regular in rate and rhythm with no murmurs, rubs, or gallops. Chest: Clear to auscultation bilaterally, with no rhonchi, wheezes, or rales. Skull: 3 x 3 cm  Soft tissue deformity in the right frontal skull area. No pain on palpation.  Lab Findings: Lab Results  Component Value Date   WBC 4.8 05/08/2015   HGB 8.7* 05/08/2015   HCT 26.5* 05/08/2015   MCV 95.8 05/08/2015   PLT 152 05/08/2015    Radiographic Findings: Right frontal skull lesion with bony destruction. There is a second lesion involving the frontal sinus with soft tissue mass and bony destruction. Findings are most consistent with metastatic disease or myeloma.  There is a probable mucocele in the frontal sinus.  Impression: Multiple myeloma with lytic bone lesions in the frontal skull and frontal sinus. The patient would be a good candidate for palliative radiation treatment to these areas.   Plan:  We discussed the treatment course, benefits, potential toxicities, and side effects of radiation with the patient. The patient wishes to proceed with treatment. The patient will be scheduled for simulation and treatment planning next week.   -----------------------------------  Blair Promise, PhD, MD  This document serves as a record of services  personally performed by Gery Pray, MD. It was created on his behalf by Arlyce Harman, a trained medical scribe. The creation of this record is based on the scribe's personal observations and the provider's statements to them. This document has been checked and approved by the attending provider.

## 2015-05-12 ENCOUNTER — Encounter: Payer: Self-pay | Admitting: Physician Assistant

## 2015-05-12 DIAGNOSIS — Z5111 Encounter for antineoplastic chemotherapy: Secondary | ICD-10-CM

## 2015-05-12 HISTORY — DX: Encounter for antineoplastic chemotherapy: Z51.11

## 2015-05-13 ENCOUNTER — Ambulatory Visit: Payer: Medicare HMO

## 2015-05-13 ENCOUNTER — Other Ambulatory Visit: Payer: Medicare HMO

## 2015-05-15 ENCOUNTER — Other Ambulatory Visit (HOSPITAL_BASED_OUTPATIENT_CLINIC_OR_DEPARTMENT_OTHER): Payer: Medicare HMO

## 2015-05-15 ENCOUNTER — Other Ambulatory Visit: Payer: Self-pay | Admitting: *Deleted

## 2015-05-15 ENCOUNTER — Ambulatory Visit: Payer: Medicare HMO

## 2015-05-15 ENCOUNTER — Ambulatory Visit (HOSPITAL_BASED_OUTPATIENT_CLINIC_OR_DEPARTMENT_OTHER): Payer: Medicare HMO

## 2015-05-15 ENCOUNTER — Other Ambulatory Visit: Payer: Medicare HMO

## 2015-05-15 ENCOUNTER — Ambulatory Visit (HOSPITAL_COMMUNITY)
Admission: RE | Admit: 2015-05-15 | Discharge: 2015-05-15 | Disposition: A | Discharge status: Home / Self Care | Payer: Medicare HMO | Source: Ambulatory Visit | Attending: Internal Medicine | Admitting: Internal Medicine

## 2015-05-15 VITALS — BP 132/86 | HR 105 | Temp 97.9°F | Resp 18

## 2015-05-15 DIAGNOSIS — Z5112 Encounter for antineoplastic immunotherapy: Secondary | ICD-10-CM

## 2015-05-15 DIAGNOSIS — C9000 Multiple myeloma not having achieved remission: Secondary | ICD-10-CM

## 2015-05-15 DIAGNOSIS — D6489 Other specified anemias: Secondary | ICD-10-CM | POA: Insufficient documentation

## 2015-05-15 DIAGNOSIS — N08 Glomerular disorders in diseases classified elsewhere: Principal | ICD-10-CM

## 2015-05-15 DIAGNOSIS — Z5111 Encounter for antineoplastic chemotherapy: Secondary | ICD-10-CM

## 2015-05-15 LAB — CBC WITH DIFFERENTIAL/PLATELET
BASO%: 0.3 % (ref 0.0–2.0)
BASOS ABS: 0 10*3/uL (ref 0.0–0.1)
EOS%: 2.9 % (ref 0.0–7.0)
Eosinophils Absolute: 0.1 10*3/uL (ref 0.0–0.5)
HCT: 24.5 % — ABNORMAL LOW (ref 38.4–49.9)
HGB: 8 g/dL — ABNORMAL LOW (ref 13.0–17.1)
LYMPH%: 8.5 % — AB (ref 14.0–49.0)
MCH: 31.5 pg (ref 27.2–33.4)
MCHC: 32.9 g/dL (ref 32.0–36.0)
MCV: 95.9 fL (ref 79.3–98.0)
MONO#: 1.3 10*3/uL — AB (ref 0.1–0.9)
MONO%: 27.2 % — ABNORMAL HIGH (ref 0.0–14.0)
NEUT#: 3 10*3/uL (ref 1.5–6.5)
NEUT%: 61.1 % (ref 39.0–75.0)
PLATELETS: 102 10*3/uL — AB (ref 140–400)
RBC: 2.55 10*6/uL — AB (ref 4.20–5.82)
RDW: 17.4 % — ABNORMAL HIGH (ref 11.0–14.6)
WBC: 4.9 10*3/uL (ref 4.0–10.3)
lymph#: 0.4 10*3/uL — ABNORMAL LOW (ref 0.9–3.3)

## 2015-05-15 LAB — COMPREHENSIVE METABOLIC PANEL
ALT: <9 U/L (ref 0–55)
ANION GAP: 8 meq/L (ref 3–11)
AST: 9 U/L (ref 5–34)
Albumin: 3.4 g/dL — ABNORMAL LOW (ref 3.5–5.0)
Alkaline Phosphatase: 52 U/L (ref 40–150)
BUN: 27.7 mg/dL — ABNORMAL HIGH (ref 7.0–26.0)
CHLORIDE: 111 meq/L — AB (ref 98–109)
CO2: 18 meq/L — AB (ref 22–29)
Calcium: 9.2 mg/dL (ref 8.4–10.4)
Creatinine: 2.2 mg/dL — ABNORMAL HIGH (ref 0.7–1.3)
EGFR: 34 mL/min/{1.73_m2} — AB (ref >90)
GLUCOSE: 101 mg/dL (ref 70–140)
POTASSIUM: 4.8 meq/L (ref 3.5–5.1)
SODIUM: 137 meq/L (ref 136–145)
Total Bilirubin: 0.95 mg/dL (ref 0.20–1.20)
Total Protein: 7.7 g/dL (ref 6.4–8.3)

## 2015-05-15 MED ORDER — SODIUM CHLORIDE 0.9 % IV SOLN
300.0000 mg/m2 | Freq: Once | INTRAVENOUS | Status: AC
  Administered 2015-05-15: 620 mg via INTRAVENOUS
  Filled 2015-05-15: qty 31

## 2015-05-15 MED ORDER — DEXTROSE 5 % IV SOLN: 36.0000 mg/m2 | Freq: Once | INTRAVENOUS | Status: DC

## 2015-05-15 MED ORDER — DEXTROSE 5 % IV SOLN
36.0000 mg/m2 | Freq: Once | INTRAVENOUS | Status: AC
  Administered 2015-05-15: 76 mg via INTRAVENOUS
  Filled 2015-05-15: qty 38

## 2015-05-15 MED ORDER — SODIUM CHLORIDE 0.9 % IV SOLN
Freq: Once | INTRAVENOUS | Status: AC
  Administered 2015-05-15: 10:00:00 via INTRAVENOUS

## 2015-05-15 MED ORDER — DEXAMETHASONE SODIUM PHOSPHATE 100 MG/10ML IJ SOLN
Freq: Once | INTRAMUSCULAR | Status: AC
  Administered 2015-05-15: 10:00:00 via INTRAVENOUS
  Filled 2015-05-15: qty 4

## 2015-05-15 NOTE — Progress Notes (Signed)
Pt set up for 2 units PRBC on 1/13 at [facility].  Pt notified of plan of care and in agreement.  Reviewed with patient the importance of leaving blood bank bracelet on for Friday's transfusion.  Pt verbalized understanding.  Pt discharged without any further questions or concerns.

## 2015-05-15 NOTE — Progress Notes (Signed)
Dr. [name] aware of labs today Creat 2.2 and Hgb 8.0 and PR of 105 today.  He gave the ok to treat today, pt is asymptomatic today.  He may transfuse patient on 1/13 with RBC's if needed at that time.

## 2015-05-15 NOTE — Patient Instructions (Signed)
[facility] Discharge Instructions for Patients Receiving Chemotherapy  Today you received the following chemotherapy agents Cytoxan and Kyprolis. To help prevent nausea and vomiting after your treatment, we encourage you to take your nausea medication as directed.  If you develop nausea and vomiting that is not controlled by your nausea medication, call the clinic.   BELOW ARE SYMPTOMS THAT SHOULD BE REPORTED IMMEDIATELY:  *FEVER GREATER THAN 100.5 F  *CHILLS WITH OR WITHOUT FEVER  NAUSEA AND VOMITING THAT IS NOT CONTROLLED WITH YOUR NAUSEA MEDICATION  *UNUSUAL SHORTNESS OF BREATH  *UNUSUAL BRUISING OR BLEEDING  TENDERNESS IN MOUTH AND THROAT WITH OR WITHOUT PRESENCE OF ULCERS  *URINARY PROBLEMS  *BOWEL PROBLEMS  UNUSUAL RASH Items with * indicate a potential emergency and should be followed up as soon as possible.  Feel free to call the clinic you have any questions or concerns. The clinic phone number is [phone].  Please show the [facility] at check-in to the Emergency Department and triage nurse.  Anemia, Nonspecific Anemia is a condition in which the concentration of red blood cells or hemoglobin in the blood is below normal. Hemoglobin is a substance in red blood cells that carries oxygen to the tissues of the body. Anemia results in not enough oxygen reaching these tissues.  CAUSES  Common causes of anemia include:   Excessive bleeding. Bleeding may be internal or external. This includes excessive bleeding from periods (in women) or from the intestine.   Poor nutrition.   Chronic kidney, thyroid, and liver disease.  Bone marrow disorders that decrease red blood cell production.  Cancer and treatments for cancer.  HIV, AIDS, and their treatments.  Spleen problems that increase red blood cell destruction.  Blood disorders.  Excess destruction of red blood cells due to infection, medicines, and autoimmune disorders. SIGNS AND  SYMPTOMS   Minor weakness.   Dizziness.   Headache.  Palpitations.   Shortness of breath, especially with exercise.   Paleness.  Cold sensitivity.  Indigestion.  Nausea.  Difficulty sleeping.  Difficulty concentrating. Symptoms may occur suddenly or they may develop slowly.  DIAGNOSIS  Additional blood tests are often needed. These help your health care provider determine the best treatment. Your health care provider will check your stool for blood and look for other causes of blood loss.  TREATMENT  Treatment varies depending on the cause of the anemia. Treatment can include:   Supplements of iron, vitamin 123456, or folic acid.   Hormone medicines.   A blood transfusion. This may be needed if blood loss is severe.   Hospitalization. This may be needed if there is significant continual blood loss.   Dietary changes.  Spleen removal. HOME CARE INSTRUCTIONS Keep all follow-up appointments. It often takes many weeks to correct anemia, and having your health care provider check on your condition and your response to treatment is very important. SEEK IMMEDIATE MEDICAL CARE IF:   You develop extreme weakness, shortness of breath, or chest pain.   You become dizzy or have trouble concentrating.  You develop heavy vaginal bleeding.   You develop a rash.   You have bloody or black, tarry stools.   You faint.   You vomit up blood.   You vomit repeatedly.   You have abdominal pain.  You have a fever or persistent symptoms for more than 2-3 days.   You have a fever and your symptoms suddenly get worse.   You are dehydrated.  MAKE SURE YOU:  Understand these instructions.  Will watch your condition.  Will get help right away if you are not doing well or get worse.   This information is not intended to replace advice given to you by your health care provider. Make sure you discuss any questions you have with your health care provider.    Document Released: 05/28/2004 Document Revised: 12/21/2012 Document Reviewed: 10/14/2012 Elsevier Interactive Patient Education [employer].

## 2015-05-15 NOTE — Progress Notes (Signed)
HAR completed Orders entered Pt has appt with [facility] Friday at 9am POF sent for lab appt 1/12

## 2015-05-16 ENCOUNTER — Ambulatory Visit (HOSPITAL_BASED_OUTPATIENT_CLINIC_OR_DEPARTMENT_OTHER): Payer: Medicare HMO

## 2015-05-16 VITALS — BP 161/98 | HR 100 | Temp 98.1°F | Resp 20

## 2015-05-16 DIAGNOSIS — Z5112 Encounter for antineoplastic immunotherapy: Secondary | ICD-10-CM | POA: Diagnosis not present

## 2015-05-16 DIAGNOSIS — C9000 Multiple myeloma not having achieved remission: Secondary | ICD-10-CM

## 2015-05-16 DIAGNOSIS — N08 Glomerular disorders in diseases classified elsewhere: Principal | ICD-10-CM

## 2015-05-16 LAB — PREPARE RBC (CROSSMATCH)

## 2015-05-16 MED ORDER — SODIUM CHLORIDE 0.9 % IV SOLN
Freq: Once | INTRAVENOUS | Status: AC
  Administered 2015-05-16: 09:00:00 via INTRAVENOUS
  Filled 2015-05-16: qty 4

## 2015-05-16 MED ORDER — CARFILZOMIB CHEMO INJECTION 60 MG
36.0000 mg/m2 | Freq: Once | INTRAVENOUS | Status: AC
  Administered 2015-05-16: 76 mg via INTRAVENOUS
  Filled 2015-05-16: qty 38

## 2015-05-16 MED ORDER — SODIUM CHLORIDE 0.9 % IV SOLN
Freq: Once | INTRAVENOUS | Status: AC
  Administered 2015-05-16: 09:00:00 via INTRAVENOUS

## 2015-05-16 MED ORDER — SODIUM CHLORIDE 0.9 % IV SOLN
Freq: Once | INTRAVENOUS | Status: AC
  Administered 2015-05-16: 10:00:00 via INTRAVENOUS

## 2015-05-16 NOTE — Patient Instructions (Signed)
[facility] Discharge Instructions for Patients Receiving Chemotherapy  Today you received the following chemotherapy agents: Kyprolis   To help prevent nausea and vomiting after your treatment, we encourage you to take your nausea medication as directed.    If you develop nausea and vomiting that is not controlled by your nausea medication, call the clinic.   BELOW ARE SYMPTOMS THAT SHOULD BE REPORTED IMMEDIATELY:  *FEVER GREATER THAN 100.5 F  *CHILLS WITH OR WITHOUT FEVER  NAUSEA AND VOMITING THAT IS NOT CONTROLLED WITH YOUR NAUSEA MEDICATION  *UNUSUAL SHORTNESS OF BREATH  *UNUSUAL BRUISING OR BLEEDING  TENDERNESS IN MOUTH AND THROAT WITH OR WITHOUT PRESENCE OF ULCERS  *URINARY PROBLEMS  *BOWEL PROBLEMS  UNUSUAL RASH Items with * indicate a potential emergency and should be followed up as soon as possible.  Feel free to call the clinic you have any questions or concerns. The clinic phone number is [phone].  Please show the CHEMO ALERT CARD at check-in to the Emergency Department and triage nurse.   

## 2015-05-16 NOTE — Progress Notes (Signed)
Per Infusion note on 05/14/14 okay to treat with labs on 05/14/14.

## 2015-05-17 ENCOUNTER — Ambulatory Visit (HOSPITAL_COMMUNITY)
Admission: RE | Admit: 2015-05-17 | Discharge: 2015-05-17 | Disposition: A | Discharge status: Home / Self Care | Payer: Medicare HMO | Source: Ambulatory Visit | Attending: Internal Medicine | Admitting: Internal Medicine

## 2015-05-17 ENCOUNTER — Other Ambulatory Visit: Payer: Self-pay | Admitting: *Deleted

## 2015-05-17 VITALS — BP 151/110 | HR 84 | Temp 98.0°F | Resp 20

## 2015-05-17 DIAGNOSIS — D6489 Other specified anemias: Secondary | ICD-10-CM

## 2015-05-17 LAB — PREPARE RBC (CROSSMATCH)

## 2015-05-17 MED ORDER — SODIUM CHLORIDE 0.9 % IV SOLN
250.0000 mL | Freq: Once | INTRAVENOUS | Status: AC
  Administered 2015-05-17: 250 mL via INTRAVENOUS

## 2015-05-17 MED ORDER — ACETAMINOPHEN 325 MG PO TABS
650.0000 mg | ORAL_TABLET | Freq: Once | ORAL | Status: AC
  Administered 2015-05-17: 650 mg via ORAL
  Filled 2015-05-17: qty 2

## 2015-05-17 MED ORDER — SODIUM CHLORIDE 0.9 % IJ SOLN: 10.0000 mL | INTRAMUSCULAR | Status: DC | PRN

## 2015-05-17 MED ORDER — DIPHENHYDRAMINE HCL 25 MG PO CAPS
25.0000 mg | ORAL_CAPSULE | Freq: Once | ORAL | Status: AC
  Administered 2015-05-17: 25 mg via ORAL
  Filled 2015-05-17: qty 1

## 2015-05-17 MED ORDER — SODIUM CHLORIDE 0.9 % IJ SOLN: 3.0000 mL | INTRAMUSCULAR | Status: DC | PRN

## 2015-05-17 MED ORDER — HEPARIN SOD (PORK) LOCK FLUSH 100 UNIT/ML IV SOLN
250.0000 [IU] | INTRAVENOUS | Status: DC | PRN
Start: 2015-05-17 — End: 2015-05-18

## 2015-05-17 MED ORDER — HEPARIN SOD (PORK) LOCK FLUSH 100 UNIT/ML IV SOLN: 500.0000 [IU] | Freq: Every day | INTRAVENOUS | Status: DC | PRN

## 2015-05-17 NOTE — Progress Notes (Signed)
Diagnosis Association: Anemia due to other cause (285.8)         MD: Mayme Genta  Procedure: Pt received 2 units of PRBC's   Condition during procedure: Pt tolerated well  Post procedure: Pt alert, oriented and ambulatory

## 2015-05-20 LAB — TYPE AND SCREEN
ABO/RH(D): B POS
Antibody Screen: NEGATIVE
UNIT DIVISION: 0
UNIT DIVISION: 0

## 2015-05-22 ENCOUNTER — Other Ambulatory Visit (HOSPITAL_BASED_OUTPATIENT_CLINIC_OR_DEPARTMENT_OTHER): Payer: Medicare HMO

## 2015-05-22 ENCOUNTER — Ambulatory Visit (HOSPITAL_BASED_OUTPATIENT_CLINIC_OR_DEPARTMENT_OTHER): Payer: Medicare HMO

## 2015-05-22 VITALS — BP 138/77 | HR 107 | Temp 98.4°F | Resp 18

## 2015-05-22 DIAGNOSIS — C9000 Multiple myeloma not having achieved remission: Secondary | ICD-10-CM

## 2015-05-22 DIAGNOSIS — Z5111 Encounter for antineoplastic chemotherapy: Secondary | ICD-10-CM

## 2015-05-22 DIAGNOSIS — Z5112 Encounter for antineoplastic immunotherapy: Secondary | ICD-10-CM

## 2015-05-22 DIAGNOSIS — N08 Glomerular disorders in diseases classified elsewhere: Principal | ICD-10-CM

## 2015-05-22 LAB — COMPREHENSIVE METABOLIC PANEL
ALT: 9 U/L (ref 0–55)
ANION GAP: 8 meq/L (ref 3–11)
AST: 13 U/L (ref 5–34)
Albumin: 3.6 g/dL (ref 3.5–5.0)
Alkaline Phosphatase: 54 U/L (ref 40–150)
BUN: 26.3 mg/dL — ABNORMAL HIGH (ref 7.0–26.0)
CALCIUM: 9.1 mg/dL (ref 8.4–10.4)
CHLORIDE: 112 meq/L — AB (ref 98–109)
CO2: 19 mEq/L — ABNORMAL LOW (ref 22–29)
CREATININE: 2.3 mg/dL — AB (ref 0.7–1.3)
EGFR: 33 mL/min/{1.73_m2} — AB (ref >90)
Glucose: 130 mg/dl (ref 70–140)
POTASSIUM: 4.4 meq/L (ref 3.5–5.1)
Sodium: 139 mEq/L (ref 136–145)
Total Bilirubin: 1.02 mg/dL (ref 0.20–1.20)
Total Protein: 7.7 g/dL (ref 6.4–8.3)

## 2015-05-22 LAB — CBC WITH DIFFERENTIAL/PLATELET
BASO%: 0.4 % (ref 0.0–2.0)
BASOS ABS: 0 10*3/uL (ref 0.0–0.1)
EOS%: 3.1 % (ref 0.0–7.0)
Eosinophils Absolute: 0.1 10*3/uL (ref 0.0–0.5)
HEMATOCRIT: 29.8 % — AB (ref 38.4–49.9)
HGB: 9.7 g/dL — ABNORMAL LOW (ref 13.0–17.1)
LYMPH#: 0.3 10*3/uL — AB (ref 0.9–3.3)
LYMPH%: 6.4 % — AB (ref 14.0–49.0)
MCH: 30.5 pg (ref 27.2–33.4)
MCHC: 32.5 g/dL (ref 32.0–36.0)
MCV: 94 fL (ref 79.3–98.0)
MONO#: 1 10*3/uL — AB (ref 0.1–0.9)
MONO%: 20.4 % — ABNORMAL HIGH (ref 0.0–14.0)
NEUT#: 3.4 10*3/uL (ref 1.5–6.5)
NEUT%: 69.7 % (ref 39.0–75.0)
PLATELETS: 120 10*3/uL — AB (ref 140–400)
RBC: 3.17 10*6/uL — AB (ref 4.20–5.82)
RDW: 18.1 % — ABNORMAL HIGH (ref 11.0–14.6)
WBC: 4.9 10*3/uL (ref 4.0–10.3)

## 2015-05-22 MED ORDER — SODIUM CHLORIDE 0.9 % IV SOLN
Freq: Once | INTRAVENOUS | Status: AC
  Administered 2015-05-22: 11:00:00 via INTRAVENOUS
  Filled 2015-05-22: qty 4

## 2015-05-22 MED ORDER — SODIUM CHLORIDE 0.9 % IV SOLN
Freq: Once | INTRAVENOUS | Status: AC
  Administered 2015-05-22: 10:00:00 via INTRAVENOUS

## 2015-05-22 MED ORDER — SODIUM CHLORIDE 0.9 % IV SOLN
300.0000 mg/m2 | Freq: Once | INTRAVENOUS | Status: AC
  Administered 2015-05-22: 620 mg via INTRAVENOUS
  Filled 2015-05-22: qty 31

## 2015-05-22 MED ORDER — CARFILZOMIB CHEMO INJECTION 60 MG
36.0000 mg/m2 | Freq: Once | INTRAVENOUS | Status: AC
  Administered 2015-05-22: 76 mg via INTRAVENOUS
  Filled 2015-05-22: qty 38

## 2015-05-22 NOTE — Patient Instructions (Signed)
[facility] Discharge Instructions for Patients Receiving Chemotherapy  Today you received the following chemotherapy agents cytoxan and kyprolis  To help prevent nausea and vomiting after your treatment, we encourage you to take your nausea medication if needed.   If you develop nausea and vomiting that is not controlled by your nausea medication, call the clinic.   BELOW ARE SYMPTOMS THAT SHOULD BE REPORTED IMMEDIATELY:  *FEVER GREATER THAN 100.5 F  *CHILLS WITH OR WITHOUT FEVER  NAUSEA AND VOMITING THAT IS NOT CONTROLLED WITH YOUR NAUSEA MEDICATION  *UNUSUAL SHORTNESS OF BREATH  *UNUSUAL BRUISING OR BLEEDING  TENDERNESS IN MOUTH AND THROAT WITH OR WITHOUT PRESENCE OF ULCERS  *URINARY PROBLEMS  *BOWEL PROBLEMS  UNUSUAL RASH Items with * indicate a potential emergency and should be followed up as soon as possible.  Feel free to call the clinic you have any questions or concerns. The clinic phone number is [phone].  Please show the [facility] at check-in to the Emergency Department and triage nurse.

## 2015-05-22 NOTE — Progress Notes (Signed)
OK to treat with creatinine 2.3 per Dr [name].

## 2015-05-23 ENCOUNTER — Ambulatory Visit (HOSPITAL_BASED_OUTPATIENT_CLINIC_OR_DEPARTMENT_OTHER): Payer: Medicare HMO

## 2015-05-23 VITALS — BP 154/87 | HR 105 | Temp 98.3°F

## 2015-05-23 DIAGNOSIS — Z5112 Encounter for antineoplastic immunotherapy: Secondary | ICD-10-CM | POA: Diagnosis not present

## 2015-05-23 DIAGNOSIS — N08 Glomerular disorders in diseases classified elsewhere: Principal | ICD-10-CM

## 2015-05-23 DIAGNOSIS — C9000 Multiple myeloma not having achieved remission: Secondary | ICD-10-CM

## 2015-05-23 MED ORDER — SODIUM CHLORIDE 0.9 % IV SOLN
Freq: Once | INTRAVENOUS | Status: AC
  Administered 2015-05-23: 10:00:00 via INTRAVENOUS

## 2015-05-23 MED ORDER — DEXAMETHASONE SODIUM PHOSPHATE 100 MG/10ML IJ SOLN
Freq: Once | INTRAMUSCULAR | Status: AC
  Administered 2015-05-23: 11:00:00 via INTRAVENOUS
  Filled 2015-05-23: qty 4

## 2015-05-23 MED ORDER — DEXTROSE 5 % IV SOLN
36.0000 mg/m2 | Freq: Once | INTRAVENOUS | Status: AC
  Administered 2015-05-23: 76 mg via INTRAVENOUS
  Filled 2015-05-23: qty 38

## 2015-05-23 NOTE — Progress Notes (Signed)
CBC and CMET faxed to Dr. Junious Silk per patient's request.

## 2015-05-23 NOTE — Patient Instructions (Signed)
[facility] Discharge Instructions for Patients Receiving Chemotherapy  Today you received the following chemotherapy agents Kyprolis  To help prevent nausea and vomiting after your treatment, we encourage you to take your nausea medication    If you develop nausea and vomiting that is not controlled by your nausea medication, call the clinic.   BELOW ARE SYMPTOMS THAT SHOULD BE REPORTED IMMEDIATELY:  *FEVER GREATER THAN 100.5 F  *CHILLS WITH OR WITHOUT FEVER  NAUSEA AND VOMITING THAT IS NOT CONTROLLED WITH YOUR NAUSEA MEDICATION  *UNUSUAL SHORTNESS OF BREATH  *UNUSUAL BRUISING OR BLEEDING  TENDERNESS IN MOUTH AND THROAT WITH OR WITHOUT PRESENCE OF ULCERS  *URINARY PROBLEMS  *BOWEL PROBLEMS  UNUSUAL RASH Items with * indicate a potential emergency and should be followed up as soon as possible.  Feel free to call the clinic you have any questions or concerns. The clinic phone number is [phone].  Please show the CHEMO ALERT CARD at check-in to the Emergency Department and triage nurse.   

## 2015-06-05 ENCOUNTER — Other Ambulatory Visit (HOSPITAL_BASED_OUTPATIENT_CLINIC_OR_DEPARTMENT_OTHER): Payer: Medicare HMO

## 2015-06-05 ENCOUNTER — Other Ambulatory Visit: Payer: Self-pay | Admitting: Internal Medicine

## 2015-06-05 ENCOUNTER — Telehealth: Payer: Self-pay | Admitting: Internal Medicine

## 2015-06-05 ENCOUNTER — Ambulatory Visit (HOSPITAL_BASED_OUTPATIENT_CLINIC_OR_DEPARTMENT_OTHER): Payer: Medicare HMO | Admitting: Internal Medicine

## 2015-06-05 ENCOUNTER — Encounter: Payer: Self-pay | Admitting: Internal Medicine

## 2015-06-05 ENCOUNTER — Telehealth: Payer: Self-pay | Admitting: *Deleted

## 2015-06-05 ENCOUNTER — Ambulatory Visit (HOSPITAL_BASED_OUTPATIENT_CLINIC_OR_DEPARTMENT_OTHER): Payer: Medicare HMO

## 2015-06-05 VITALS — BP 160/95 | HR 108 | Temp 97.9°F | Resp 18 | Wt 193.4 lb

## 2015-06-05 DIAGNOSIS — C9000 Multiple myeloma not having achieved remission: Secondary | ICD-10-CM | POA: Diagnosis not present

## 2015-06-05 DIAGNOSIS — D6481 Anemia due to antineoplastic chemotherapy: Secondary | ICD-10-CM

## 2015-06-05 DIAGNOSIS — N08 Glomerular disorders in diseases classified elsewhere: Secondary | ICD-10-CM

## 2015-06-05 DIAGNOSIS — Z5111 Encounter for antineoplastic chemotherapy: Secondary | ICD-10-CM | POA: Diagnosis not present

## 2015-06-05 DIAGNOSIS — N289 Disorder of kidney and ureter, unspecified: Secondary | ICD-10-CM

## 2015-06-05 DIAGNOSIS — Z5112 Encounter for antineoplastic immunotherapy: Secondary | ICD-10-CM | POA: Diagnosis not present

## 2015-06-05 DIAGNOSIS — N185 Chronic kidney disease, stage 5: Secondary | ICD-10-CM

## 2015-06-05 DIAGNOSIS — T451X5A Adverse effect of antineoplastic and immunosuppressive drugs, initial encounter: Secondary | ICD-10-CM

## 2015-06-05 LAB — COMPREHENSIVE METABOLIC PANEL
ALBUMIN: 3.6 g/dL (ref 3.5–5.0)
ALK PHOS: 50 U/L (ref 40–150)
ALT: 12 U/L (ref 0–55)
ANION GAP: 9 meq/L (ref 3–11)
AST: 15 U/L (ref 5–34)
BILIRUBIN TOTAL: 1.1 mg/dL (ref 0.20–1.20)
BUN: 23.5 mg/dL (ref 7.0–26.0)
CO2: 18 mEq/L — ABNORMAL LOW (ref 22–29)
Calcium: 9.2 mg/dL (ref 8.4–10.4)
Chloride: 111 mEq/L — ABNORMAL HIGH (ref 98–109)
Creatinine: 2.4 mg/dL — ABNORMAL HIGH (ref 0.7–1.3)
EGFR: 31 mL/min/{1.73_m2} — AB (ref >90)
Glucose: 115 mg/dl (ref 70–140)
Potassium: 4.3 mEq/L (ref 3.5–5.1)
Sodium: 137 mEq/L (ref 136–145)
TOTAL PROTEIN: 8 g/dL (ref 6.4–8.3)

## 2015-06-05 LAB — CBC WITH DIFFERENTIAL/PLATELET
BASO%: 0.7 % (ref 0.0–2.0)
Basophils Absolute: 0 10*3/uL (ref 0.0–0.1)
EOS ABS: 0.1 10*3/uL (ref 0.0–0.5)
EOS%: 3.1 % (ref 0.0–7.0)
HCT: 26.3 % — ABNORMAL LOW (ref 38.4–49.9)
HGB: 8.9 g/dL — ABNORMAL LOW (ref 13.0–17.1)
LYMPH%: 11.1 % — AB (ref 14.0–49.0)
MCH: 31.7 pg (ref 27.2–33.4)
MCHC: 33.8 g/dL (ref 32.0–36.0)
MCV: 93.6 fL (ref 79.3–98.0)
MONO#: 0.8 10*3/uL (ref 0.1–0.9)
MONO%: 17.9 % — ABNORMAL HIGH (ref 0.0–14.0)
NEUT%: 67.2 % (ref 39.0–75.0)
NEUTROS ABS: 2.9 10*3/uL (ref 1.5–6.5)
PLATELETS: 162 10*3/uL (ref 140–400)
RBC: 2.81 10*6/uL — AB (ref 4.20–5.82)
RDW: 16.9 % — AB (ref 11.0–14.6)
WBC: 4.3 10*3/uL (ref 4.0–10.3)
lymph#: 0.5 10*3/uL — ABNORMAL LOW (ref 0.9–3.3)

## 2015-06-05 MED ORDER — CYCLOPHOSPHAMIDE CHEMO INJECTION 1 GM
300.0000 mg/m2 | Freq: Once | INTRAMUSCULAR | Status: AC
  Administered 2015-06-05: 620 mg via INTRAVENOUS
  Filled 2015-06-05: qty 31

## 2015-06-05 MED ORDER — CARFILZOMIB CHEMO INJECTION 60 MG
36.0000 mg/m2 | Freq: Once | INTRAVENOUS | Status: AC
  Administered 2015-06-05: 76 mg via INTRAVENOUS
  Filled 2015-06-05: qty 38

## 2015-06-05 MED ORDER — LIDOCAINE-PRILOCAINE 2.5-2.5 % EX CREA: 1.0000 "application " | TOPICAL_CREAM | CUTANEOUS | Status: AC | PRN

## 2015-06-05 MED ORDER — SODIUM CHLORIDE 0.9 % IV SOLN
Freq: Once | INTRAVENOUS | Status: AC
  Administered 2015-06-05: 11:00:00 via INTRAVENOUS
  Filled 2015-06-05: qty 4

## 2015-06-05 MED ORDER — SODIUM CHLORIDE 0.9 % IV SOLN
Freq: Once | INTRAVENOUS | Status: AC
  Administered 2015-06-05: 11:00:00 via INTRAVENOUS

## 2015-06-05 NOTE — Patient Instructions (Signed)
[facility] Discharge Instructions for Patients Receiving Chemotherapy  Today you received the following chemotherapy agents :  Kyprolis,  Cytoxan.  To help prevent nausea and vomiting after your treatment, we encourage you to take your nausea medication as prescribed.   If you develop nausea and vomiting that is not controlled by your nausea medication, call the clinic.   BELOW ARE SYMPTOMS THAT SHOULD BE REPORTED IMMEDIATELY:  *FEVER GREATER THAN 100.5 F  *CHILLS WITH OR WITHOUT FEVER  NAUSEA AND VOMITING THAT IS NOT CONTROLLED WITH YOUR NAUSEA MEDICATION  *UNUSUAL SHORTNESS OF BREATH  *UNUSUAL BRUISING OR BLEEDING  TENDERNESS IN MOUTH AND THROAT WITH OR WITHOUT PRESENCE OF ULCERS  *URINARY PROBLEMS  *BOWEL PROBLEMS  UNUSUAL RASH Items with * indicate a potential emergency and should be followed up as soon as possible.  Feel free to call the clinic you have any questions or concerns. The clinic phone number is [phone].  Please show the CHEMO ALERT CARD at check-in to the Emergency Department and triage nurse.   

## 2015-06-05 NOTE — Telephone Encounter (Signed)
Per staff message and POF I have scheduled appts. Advised scheduler of appts. JMW  

## 2015-06-05 NOTE — Progress Notes (Signed)
[facility] [phone]   [phone]  OFFICE PROGRESS NOTE  [name], MD [place] [address]  DIAGNOSIS:  Multiple myeloma diagnosed in May 2016  PRIOR THERAPY:  1) Curative radiotherapy to the tumor mass at the right anterior chest under the care of Dr. [name] completed in 12/27/2013. 2) Systemic chemotherapy with weekly subcutaneous Velcade 1.3 MG/M2, Revlimid 25 mg by mouth daily for 21 days every 4 weeks in addition to weekly Decadron 40 mg orally. Last dose of Velcade was giving 03/08/2015. This treatment was discontinued secondary to disease progression.  CURRENT THERAPY: Carfilzomib 20 MG/M2 initially first week followed by 36 MG/M2 on days 1, 2, 8, 9, 15 and 16 every 4 weeks in addition to Cytoxan 300 MG/M2 weekly as well as Decadron 40 mg on a weekly basis. First dose scheduled on 04/08/2015. Status post 2 cycles.  INTERVAL HISTORY: Kalmen Lollar 70 y.o. male returns to the clinic today for follow-up visit accompanied by his wife. The patient is feeling fine today with no specific complaints. He has been tolerating his treatment fairly well with no significant adverse effect except for mild fatigue. The skull lesion had significantly improved with the current systemic chemotherapy with no palliative radiation. The patient denied having any significant weakness. He denied having any chest pain, shortness of breath, cough or hemoptysis. He has no nausea or vomiting. He is here today for evaluation before starting cycle #3 of his systemic chemotherapy.  MEDICAL HISTORY: Past Medical History  Diagnosis Date  . Pulmonary nodule, right     W/  CHEST WALL MASS--  SCHEDULED FOR BIOPSY W/ DR [name] ON FRIDAY 10-27-2013  . History of seizures as a child     AGE 24 OR 8--- NONE ISSUE SINCE  . History of GI bleed     SECONDARY TO ULCER --  DEC 2011  RESOLVED PER LAST EGD 07-05-2010  . Bladder tumor   .  Hematuria   . Radiation 12/07/13-12/27/13    right anterior chest 37.5 gray  . Fracture of radius 09/29/14    left  . Cancer ([place])     multiple myeloma, bladder cancer 2015  . Encounter for antineoplastic chemotherapy 05/12/2015    ALLERGIES:  has No Known Allergies.  MEDICATIONS:  Current Outpatient Prescriptions  Medication Sig Dispense Refill  . acetaminophen (TYLENOL) 325 MG tablet Take 2 tablets (650 mg total) by mouth every 4 (four) hours as needed for mild pain, fever or headache. 30 tablet 0  . dexamethasone (DECADRON) 4 MG tablet 10 tablet by mouth every week starting with the first dose of chemotherapy. 80 tablet 2  . finasteride (PROSCAR) 5 MG tablet Take 1 tablet (5 mg total) by mouth daily. 30 tablet 1  . hydrALAZINE (APRESOLINE) 50 MG tablet Take 1 tablet (50 mg total) by mouth every 8 (eight) hours. 90 tablet 1  . prochlorperazine (COMPAZINE) 10 MG tablet Take 1 tablet (10 mg total) by mouth every 6 (six) hours as needed for nausea or vomiting. 30 tablet 0  . tamsulosin (FLOMAX) 0.4 MG CAPS capsule Take 1 capsule (0.4 mg total) by mouth daily. (Patient taking differently: Take 0.4 mg by mouth daily. Take 2 pills per day one in the morning and 1 in the evening) 30 capsule 1  . HYDROcodone-acetaminophen (NORCO/VICODIN) 5-325 MG tablet Take 1-2 tablets by mouth every 4 (four) hours as needed for moderate pain. (Patient not taking: Reported on 05/09/2015) 65 tablet  0   No current facility-administered medications for this visit.    SURGICAL HISTORY:  Past Surgical History  Procedure Laterality Date  . Excision benign left axilla tumor  1993  . Esophagogastroduodenoscopy  X5   LAST ONE 07-05-2010  . Transurethral resection of bladder tumor with gyrus (turbt-gyrus) N/A 10/25/2013    Procedure: TRANSURETHRAL RESECTION OF BLADDER TUMOR WITH GYRUS (TURBT-GYRUS);  Surgeon: Sharyn Creamer, MD;  Location: [facility];  Service: Urology;  Laterality: N/A;  . Cystoscopy  with biopsy N/A 10/25/2013    Procedure: CYSTOSCOPY;  Surgeon: Sharyn Creamer, MD;  Location: [facility];  Service: Urology;  Laterality: N/A;  . Bone biopsy Right 11/14/13     right seventh rib lesion  . Orif radial fracture Left 10/22/2014    Procedure: Intramedullary nailing left radius fracture;  Surgeon: Milly Jakob, MD;  Location: [facility];  Service: Orthopedics;  Laterality: Left;  OPEN TREATMENT LEFT RADIUS FRACTURE  . Cystoscopy with retrograde pyelogram, ureteroscopy and stent placement N/A 02/21/2015    Procedure: CYSTOSCOPY, EXAM UNDER ANESTHESIA, FOLEY CATHETER PLACEMENT;  Surgeon: Festus Aloe, MD;  Location: [facility];  Service: Urology;  Laterality: N/A;    REVIEW OF SYSTEMS:  Constitutional: positive for fatigue Eyes: negative Ears, nose, mouth, throat, and face: negative Respiratory: negative Cardiovascular: negative Gastrointestinal: negative Genitourinary:negative Integument/breast: negative Hematologic/lymphatic: negative Musculoskeletal:positive for bone pain Neurological: negative Behavioral/Psych: negative Endocrine: negative Allergic/Immunologic: negative   PHYSICAL EXAMINATION: General appearance: alert, cooperative and no distress Head: Normocephalic, without obvious abnormality, atraumatic Neck: no adenopathy, no carotid bruit, supple, symmetrical, trachea midline and thyroid not enlarged, symmetric, no tenderness/mass/nodules Lymph nodes: Cervical, supraclavicular, and axillary nodes normal. Resp: clear to auscultation bilaterally Back: symmetric, no curvature. ROM normal. No CVA tenderness. Cardio: regular rate and rhythm, S1, S2 normal, no murmur, click, rub or gallop GI: soft, non-tender; bowel sounds normal; no masses,  no organomegaly Extremities: extremities normal, atraumatic, no cyanosis or edema Neurologic: Alert and oriented X 3, normal strength and tone. Normal symmetric reflexes. Normal coordination and  gait  ECOG PERFORMANCE STATUS: 1 - Symptomatic but completely ambulatory  Blood pressure 160/95, pulse 108, temperature 97.9 F (36.6 C), temperature source Oral, resp. rate 18, weight 193 lb 6.4 oz (87.726 kg), SpO2 100 %.  LABORATORY DATA: Lab Results  Component Value Date   WBC 4.3 06/05/2015   HGB 8.9* 06/05/2015   HCT 26.3* 06/05/2015   MCV 93.6 06/05/2015   PLT 162 06/05/2015      Chemistry      Component Value Date/Time   NA 137 06/05/2015 0855   NA 138 03/25/2015 0432   K 4.3 06/05/2015 0855   K 4.6 03/25/2015 0432   CL 108 03/25/2015 0432   CO2 18* 06/05/2015 0855   CO2 21* 03/25/2015 0432   BUN 23.5 06/05/2015 0855   BUN 28* 03/25/2015 0432   CREATININE 2.4* 06/05/2015 0855   CREATININE 2.18* 03/25/2015 0432      Component Value Date/Time   CALCIUM 9.2 06/05/2015 0855   CALCIUM 8.9 03/25/2015 0432   ALKPHOS 50 06/05/2015 0855   ALKPHOS 65 03/20/2015 1640   AST 15 06/05/2015 0855   AST 32 03/20/2015 1640   ALT 12 06/05/2015 0855   ALT 26 03/20/2015 1640   BILITOT 1.10 06/05/2015 0855   BILITOT 1.0 03/20/2015 1640       RADIOGRAPHIC STUDIES: No results found.  ASSESSMENT AND PLAN: This is a very pleasant 70 years old African-American male  with recently diagnosed multiple myeloma.  I had a lengthy discussion with the patient and his wife about his current condition and treatment options. The patient was started on treatment with subcutaneous Velcade, Revlimid and Decadron for around 4 months but this was discontinued secondary to disease progression with increase in the retroperitoneal mass in addition to new lytic bone lesions. 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 I recommended for the patient to discontinue his current treatment with Velcade, Revlimid and Decadron. He is currently undergoing treatment with Carfilzomib, Cytoxan and Decadron status post 2 cycles and tolerating  his treatment well.  He would come back for follow-up visit in 4 weeks for reevaluation after repeating myeloma panel. For IV access, I will arrange for the patient to have a Port-A-Cath placed. I will call his pharmacy with prescription for Emla cream to be applied to the Port-A-Cath site before treatment. He was advised to call if he has any concerning symptoms in the interval. The patient voices understanding of current disease status and treatment options and is in agreement with the current care plan.  All questions were answered. The patient knows to call the clinic with any problems, questions or concerns. We can certainly see the patient much sooner if necessary.  Disclaimer: This note was dictated with voice recognition software. Similar sounding words can inadvertently be transcribed and may not be corrected upon review.

## 2015-06-05 NOTE — Telephone Encounter (Signed)
per pof to sch pt appt-sent MW email to sch trmt per pof-pt to get updated copy b4 leavin

## 2015-06-06 ENCOUNTER — Ambulatory Visit (HOSPITAL_BASED_OUTPATIENT_CLINIC_OR_DEPARTMENT_OTHER): Payer: Medicare HMO

## 2015-06-06 VITALS — BP 146/89 | HR 106 | Temp 98.0°F | Resp 18

## 2015-06-06 DIAGNOSIS — Z5112 Encounter for antineoplastic immunotherapy: Secondary | ICD-10-CM | POA: Diagnosis not present

## 2015-06-06 DIAGNOSIS — C9000 Multiple myeloma not having achieved remission: Secondary | ICD-10-CM

## 2015-06-06 DIAGNOSIS — N08 Glomerular disorders in diseases classified elsewhere: Principal | ICD-10-CM

## 2015-06-06 MED ORDER — DEXTROSE 5 % IV SOLN
36.0000 mg/m2 | Freq: Once | INTRAVENOUS | Status: AC
  Administered 2015-06-06: 76 mg via INTRAVENOUS
  Filled 2015-06-06: qty 38

## 2015-06-06 MED ORDER — SODIUM CHLORIDE 0.9 % IV SOLN
Freq: Once | INTRAVENOUS | Status: AC
  Administered 2015-06-06: 10:00:00 via INTRAVENOUS

## 2015-06-06 MED ORDER — SODIUM CHLORIDE 0.9 % IV SOLN
Freq: Once | INTRAVENOUS | Status: AC
  Administered 2015-06-06: 11:00:00 via INTRAVENOUS
  Filled 2015-06-06: qty 4

## 2015-06-06 NOTE — Patient Instructions (Signed)
[facility] Discharge Instructions for Patients Receiving Chemotherapy  Today you received the following chemotherapy agents: Kyprolis   To help prevent nausea and vomiting after your treatment, we encourage you to take your nausea medication as directed.    If you develop nausea and vomiting that is not controlled by your nausea medication, call the clinic.   BELOW ARE SYMPTOMS THAT SHOULD BE REPORTED IMMEDIATELY:  *FEVER GREATER THAN 100.5 F  *CHILLS WITH OR WITHOUT FEVER  NAUSEA AND VOMITING THAT IS NOT CONTROLLED WITH YOUR NAUSEA MEDICATION  *UNUSUAL SHORTNESS OF BREATH  *UNUSUAL BRUISING OR BLEEDING  TENDERNESS IN MOUTH AND THROAT WITH OR WITHOUT PRESENCE OF ULCERS  *URINARY PROBLEMS  *BOWEL PROBLEMS  UNUSUAL RASH Items with * indicate a potential emergency and should be followed up as soon as possible.  Feel free to call the clinic you have any questions or concerns. The clinic phone number is [phone].  Please show the CHEMO ALERT CARD at check-in to the Emergency Department and triage nurse.   

## 2015-06-06 NOTE — Progress Notes (Signed)
OK to treat with elevated Creatinine per Dr. [name].

## 2015-06-07 ENCOUNTER — Other Ambulatory Visit: Payer: Self-pay | Admitting: General Surgery

## 2015-06-10 ENCOUNTER — Other Ambulatory Visit: Payer: Self-pay | Admitting: Internal Medicine

## 2015-06-10 ENCOUNTER — Encounter: Payer: Self-pay | Admitting: Internal Medicine

## 2015-06-10 ENCOUNTER — Ambulatory Visit (INDEPENDENT_AMBULATORY_CARE_PROVIDER_SITE_OTHER): Payer: Medicare HMO | Admitting: Internal Medicine

## 2015-06-10 ENCOUNTER — Ambulatory Visit (HOSPITAL_COMMUNITY)
Admission: RE | Admit: 2015-06-10 | Discharge: 2015-06-10 | Disposition: A | Discharge status: Home / Self Care | Payer: Medicare HMO | Source: Ambulatory Visit | Attending: Internal Medicine | Admitting: Internal Medicine

## 2015-06-10 ENCOUNTER — Encounter (HOSPITAL_COMMUNITY): Payer: Self-pay

## 2015-06-10 VITALS — BP 172/110 | HR 100 | Temp 98.4°F | Resp 20 | Ht 68.0 in | Wt 194.0 lb

## 2015-06-10 DIAGNOSIS — I10 Essential (primary) hypertension: Secondary | ICD-10-CM

## 2015-06-10 DIAGNOSIS — T451X5A Adverse effect of antineoplastic and immunosuppressive drugs, initial encounter: Secondary | ICD-10-CM

## 2015-06-10 DIAGNOSIS — Z5111 Encounter for antineoplastic chemotherapy: Secondary | ICD-10-CM

## 2015-06-10 DIAGNOSIS — N185 Chronic kidney disease, stage 5: Secondary | ICD-10-CM | POA: Diagnosis not present

## 2015-06-10 DIAGNOSIS — C9000 Multiple myeloma not having achieved remission: Secondary | ICD-10-CM

## 2015-06-10 DIAGNOSIS — N08 Glomerular disorders in diseases classified elsewhere: Secondary | ICD-10-CM

## 2015-06-10 DIAGNOSIS — N2889 Other specified disorders of kidney and ureter: Secondary | ICD-10-CM

## 2015-06-10 DIAGNOSIS — D6481 Anemia due to antineoplastic chemotherapy: Secondary | ICD-10-CM

## 2015-06-10 LAB — CBC
HEMATOCRIT: 24.7 % — AB (ref 39.0–52.0)
HEMOGLOBIN: 8.4 g/dL — AB (ref 13.0–17.0)
MCH: 31.3 pg (ref 26.0–34.0)
MCHC: 34 g/dL (ref 30.0–36.0)
MCV: 92.2 fL (ref 78.0–100.0)
Platelets: 134 10*3/uL — ABNORMAL LOW (ref 150–400)
RBC: 2.68 MIL/uL — ABNORMAL LOW (ref 4.22–5.81)
RDW: 16.5 % — AB (ref 11.5–15.5)
WBC: 4.6 10*3/uL (ref 4.0–10.5)

## 2015-06-10 LAB — APTT: APTT: 29 s (ref 24–37)

## 2015-06-10 LAB — PROTIME-INR
INR: 1.1 (ref 0.00–1.49)
Prothrombin Time: 14.4 seconds (ref 11.6–15.2)

## 2015-06-10 MED ORDER — LIDOCAINE HCL 1 % IJ SOLN
INTRAMUSCULAR | Status: AC
  Filled 2015-06-10: qty 20

## 2015-06-10 MED ORDER — HEPARIN SOD (PORK) LOCK FLUSH 100 UNIT/ML IV SOLN
INTRAVENOUS | Status: AC | PRN
  Administered 2015-06-10: 500 [IU] via INTRAVENOUS

## 2015-06-10 MED ORDER — FENTANYL CITRATE (PF) 100 MCG/2ML IJ SOLN
INTRAMUSCULAR | Status: AC
  Filled 2015-06-10: qty 4

## 2015-06-10 MED ORDER — CEFAZOLIN SODIUM-DEXTROSE 2-3 GM-% IV SOLR
INTRAVENOUS | Status: AC
  Administered 2015-06-10: 2 g via INTRAVENOUS
  Filled 2015-06-10: qty 50

## 2015-06-10 MED ORDER — MIDAZOLAM HCL 2 MG/2ML IJ SOLN
INTRAMUSCULAR | Status: AC | PRN
  Administered 2015-06-10 (×3): 1 mg via INTRAVENOUS

## 2015-06-10 MED ORDER — HEPARIN SOD (PORK) LOCK FLUSH 100 UNIT/ML IV SOLN
INTRAVENOUS | Status: AC
  Filled 2015-06-10: qty 5

## 2015-06-10 MED ORDER — CEFAZOLIN SODIUM-DEXTROSE 2-3 GM-% IV SOLR
2.0000 g | INTRAVENOUS | Status: AC
  Administered 2015-06-10: 2 g via INTRAVENOUS

## 2015-06-10 MED ORDER — FENTANYL CITRATE (PF) 100 MCG/2ML IJ SOLN
INTRAMUSCULAR | Status: AC | PRN
  Administered 2015-06-10: 50 ug via INTRAVENOUS

## 2015-06-10 MED ORDER — AMLODIPINE BESYLATE 5 MG PO TABS: 5.0000 mg | ORAL_TABLET | Freq: Every day | ORAL | Status: AC

## 2015-06-10 MED ORDER — LIDOCAINE-EPINEPHRINE 2 %-1:100000 IJ SOLN
INTRAMUSCULAR | Status: AC
  Filled 2015-06-10: qty 1

## 2015-06-10 MED ORDER — MIDAZOLAM HCL 2 MG/2ML IJ SOLN
INTRAMUSCULAR | Status: AC
  Filled 2015-06-10: qty 6

## 2015-06-10 MED ORDER — SODIUM CHLORIDE 0.9 % IV SOLN
INTRAVENOUS | Status: DC
  Administered 2015-06-10: 08:00:00 via INTRAVENOUS

## 2015-06-10 NOTE — Discharge Instructions (Signed)
Implanted Port Home Guide °An implanted port is a type of central line that is placed under the skin. Central lines are used to provide IV access when treatment or nutrition needs to be given through a person's veins. Implanted ports are used for long-term IV access. An implanted port may be placed because:  °· You need IV medicine that would be irritating to the small veins in your hands or arms.   °· You need long-term IV medicines, such as antibiotics.   °· You need IV nutrition for a long period.   °· You need frequent blood draws for lab tests.   °· You need dialysis.   °Implanted ports are usually placed in the chest area, but they can also be placed in the upper arm, the abdomen, or the leg. An implanted port has two main parts:  °· Reservoir. The reservoir is round and will appear as a small, raised area under your skin. The reservoir is the part where a needle is inserted to give medicines or draw blood.   °· Catheter. The catheter is a thin, flexible tube that extends from the reservoir. The catheter is placed into a large vein. Medicine that is inserted into the reservoir goes into the catheter and then into the vein.   °HOW WILL I CARE FOR MY INCISION SITE? °Do not get the incision site wet. Bathe or shower as directed by your health care provider.  °HOW IS MY PORT ACCESSED? °Special steps must be taken to access the port:  °· Before the port is accessed, a numbing cream can be placed on the skin. This helps numb the skin over the port site.   °· Your health care provider uses a sterile technique to access the port. °· Your health care provider must put on a mask and sterile gloves. °· The skin over your port is cleaned carefully with an antiseptic and allowed to dry. °· The port is gently pinched between sterile gloves, and a needle is inserted into the port. °· Only "non-coring" port needles should be used to access the port. Once the port is accessed, a blood return should be checked. This helps  ensure that the port is in the vein and is not clogged.   °· If your port needs to remain accessed for a constant infusion, a clear (transparent) bandage will be placed over the needle site. The bandage and needle will need to be changed every week, or as directed by your health care provider.   °· Keep the bandage covering the needle clean and dry. Do not get it wet. Follow your health care provider's instructions on how to take a shower or bath while the port is accessed.   °· If your port does not need to stay accessed, no bandage is needed over the port.   °WHAT IS FLUSHING? °Flushing helps keep the port from getting clogged. Follow your health care provider's instructions on how and when to flush the port. Ports are usually flushed with saline solution or a medicine called heparin. The need for flushing will depend on how the port is used.  °· If the port is used for intermittent medicines or blood draws, the port will need to be flushed:   °· After medicines have been given.   °· After blood has been drawn.   °· As part of routine maintenance.   °· If a constant infusion is running, the port may not need to be flushed.   °HOW LONG WILL MY PORT STAY IMPLANTED? °The port can stay in for as long as your health care   provider thinks it is needed. When it is time for the port to come out, surgery will be done to remove it. The procedure is similar to the one performed when the port was put in.  WHEN SHOULD I SEEK IMMEDIATE MEDICAL CARE? When you have an implanted port, you should seek immediate medical care if:   You notice a bad smell coming from the incision site.   You have swelling, redness, or drainage at the incision site.   You have more swelling or pain at the port site or the surrounding area.   You have a fever that is not controlled with medicine.   This information is not intended to replace advice given to you by your health care provider. Make sure you discuss any questions you have with  your health care provider.   Document Released: 04/20/2005 Document Revised: 02/08/2013 Document Reviewed: 12/26/2012 Elsevier Interactive Patient Education 2016 [place] Insertion, Care After Refer to this sheet in the next few weeks. These instructions provide you with information on caring for yourself after your procedure. Your health care provider may also give you more specific instructions. Your treatment has been planned according to current medical practices, but problems sometimes occur. Call your health care provider if you have any problems or questions after your procedure. WHAT TO EXPECT AFTER THE PROCEDURE After your procedure, it is typical to have the following:   Discomfort at the port insertion site. Ice packs to the area will help.  Bruising on the skin over the port. This will subside in 3-4 days. HOME CARE INSTRUCTIONS  After your port is placed, you will get a manufacturer's information card. The card has information about your port. Keep this card with you at all times.   Know what kind of port you have. There are many types of ports available.   Wear a medical alert bracelet in case of an emergency. This can help alert health care workers that you have a port.   The port can stay in for as long as your health care provider believes it is necessary.   A home health care nurse may give medicines and take care of the port.   You or a family member can get special training and directions for giving medicine and taking care of the port at home.  SEEK MEDICAL CARE IF:   Your port does not flush or you are unable to get a blood return.   You have a fever or chills. SEEK IMMEDIATE MEDICAL CARE IF:  You have new fluid or pus coming from your incision.   You notice a bad smell coming from your incision site.   You have swelling, pain, or more redness at the incision or port site.   You have chest pain or shortness of  breath.  Remove dressing prior to shower 24 to 48 hours post procedure.  Keep wound clean and dry. Report signs of infection.   This information is not intended to replace advice given to you by your health care provider. Make sure you discuss any questions you have with your health care provider.   Document Released: 02/08/2013 Document Revised: 04/25/2013 Document Reviewed: 02/08/2013 Elsevier Interactive Patient Education 2016 [employer]  Moderate Conscious Sedation, Adult, Care After Refer to this sheet in the next few weeks. These instructions provide you with information on caring for yourself after your procedure. Your health care provider may also give you more specific instructions. Your treatment has been planned  according to current medical practices, but problems sometimes occur. Call your health care provider if you have any problems or questions after your procedure. WHAT TO EXPECT AFTER THE PROCEDURE  After your procedure:  You may feel sleepy, clumsy, and have poor balance for several hours.  Vomiting may occur if you eat too soon after the procedure. HOME CARE INSTRUCTIONS  Do not participate in any activities where you could become injured for at least 24 hours. Do not:  Drive.  Swim.  Ride a bicycle.  Operate heavy machinery.  Cook.  Use power tools.  Climb ladders.  Work from a high place.  Do not make important decisions or sign legal documents until you are improved.  If you vomit, drink water, juice, or soup when you can drink without vomiting. Make sure you have little or no nausea before eating solid foods.  Only take over-the-counter or prescription medicines for pain, discomfort, or fever as directed by your health care provider.  Make sure you and your family fully understand everything about the medicines given to you, including what side effects may occur.  You should not drink alcohol, take sleeping pills, or take medicines that cause  drowsiness for at least 24 hours.  If you smoke, do not smoke without supervision.  If you are feeling better, you may resume normal activities 24 hours after you were sedated.  Keep all appointments with your health care provider. SEEK MEDICAL CARE IF:  Your skin is pale or bluish in color.  You continue to feel nauseous or vomit.  Your pain is getting worse and is not helped by medicine.  You have bleeding or swelling.  You are still sleepy or feeling clumsy after 24 hours. SEEK IMMEDIATE MEDICAL CARE IF:  You develop a rash.  You have difficulty breathing.  You develop any type of allergic problem.  You have a fever. MAKE SURE YOU:  Understand these instructions.  Will watch your condition.  Will get help right away if you are not doing well or get worse.   This information is not intended to replace advice given to you by your health care provider. Make sure you discuss any questions you have with your health care provider.   Document Released: 02/08/2013 Document Revised: 05/11/2014 Document Reviewed: 02/08/2013 Elsevier Interactive Patient Education [employer].

## 2015-06-10 NOTE — Progress Notes (Signed)
Pre visit review using our clinic review tool, if applicable. No additional management support is needed unless otherwise documented below in the visit note. 

## 2015-06-10 NOTE — Patient Instructions (Addendum)
Resume  Apresoline 50 mg 3 times daily  Please check your blood pressure on a regular basis.   Please keep a diary.  Return in one week for follow-up   Amlodipine 5 mg daily  Limit your sodium (Salt) intake   Low-Sodium Eating Plan Sodium raises blood pressure and causes water to be held in the body. Getting less sodium from food will help lower your blood pressure, reduce any swelling, and protect your heart, liver, and kidneys. We get sodium by adding salt (sodium chloride) to food. Most of our sodium comes from canned, boxed, and frozen foods. Restaurant foods, fast foods, and pizza are also very high in sodium. Even if you take medicine to lower your blood pressure or to reduce fluid in your body, getting less sodium from your food is important. WHAT IS MY PLAN? Most people should limit their sodium intake to 2,300 mg a day. Your health care provider recommends that you limit your sodium intake to __________ a day.  WHAT DO I NEED TO KNOW ABOUT THIS EATING PLAN? For the low-sodium eating plan, you will follow these general guidelines:  Choose foods with a % Daily Value for sodium of less than 5% (as listed on the food label).   Use salt-free seasonings or herbs instead of table salt or sea salt.   Check with your health care provider or pharmacist before using salt substitutes.   Eat fresh foods.  Eat more vegetables and fruits.  Limit canned vegetables. If you do use them, rinse them well to decrease the sodium.   Limit cheese to 1 oz (28 g) per day.   Eat lower-sodium products, often labeled as "lower sodium" or "no salt added."  Avoid foods that contain monosodium glutamate (MSG). MSG is sometimes added to [place] food and some canned foods.  Check food labels (Nutrition Facts labels) on foods to learn how much sodium is in one serving.  Eat more home-cooked food and less restaurant, buffet, and fast food.  When eating at a restaurant, ask that your food be  prepared with less salt, or no salt if possible.  HOW DO I READ FOOD LABELS FOR SODIUM INFORMATION? The Nutrition Facts label lists the amount of sodium in one serving of the food. If you eat more than one serving, you must multiply the listed amount of sodium by the number of servings. Food labels may also identify foods as:  Sodium free--Less than 5 mg in a serving.  Very low sodium--35 mg or less in a serving.  Low sodium--140 mg or less in a serving.  Light in sodium--50% less sodium in a serving. For example, if a food that usually has 300 mg of sodium is changed to become light in sodium, it will have 150 mg of sodium.  Reduced sodium--25% less sodium in a serving. For example, if a food that usually has 400 mg of sodium is changed to reduced sodium, it will have 300 mg of sodium. WHAT FOODS CAN I EAT? Grains Low-sodium cereals, including oats, puffed wheat and rice, and shredded wheat cereals. Low-sodium crackers. Unsalted rice and pasta. Lower-sodium bread.  Vegetables Frozen or fresh vegetables. Low-sodium or reduced-sodium canned vegetables. Low-sodium or reduced-sodium tomato sauce and paste. Low-sodium or reduced-sodium tomato and vegetable juices.  Fruits Fresh, frozen, and canned fruit. Fruit juice.  Meat and Other Protein Products Low-sodium canned tuna and salmon. Fresh or frozen meat, poultry, seafood, and fish. Lamb. Unsalted nuts. Dried beans, peas, and lentils without added salt.  Unsalted canned beans. Homemade soups without salt. Eggs.  Dairy Milk. Soy milk. Ricotta cheese. Low-sodium or reduced-sodium cheeses. Yogurt.  Condiments Fresh and dried herbs and spices. Salt-free seasonings. Onion and garlic powders. Low-sodium varieties of mustard and ketchup. Fresh or refrigerated horseradish. Lemon juice.  Fats and Oils Reduced-sodium salad dressings. Unsalted butter.  Other Unsalted popcorn and pretzels.  The items listed above may not be a complete list  of recommended foods or beverages. Contact your dietitian for more options. WHAT FOODS ARE NOT RECOMMENDED? Grains Instant hot cereals. Bread stuffing, pancake, and biscuit mixes. Croutons. Seasoned rice or pasta mixes. Noodle soup cups. Boxed or frozen macaroni and cheese. Self-rising flour. Regular salted crackers. Vegetables Regular canned vegetables. Regular canned tomato sauce and paste. Regular tomato and vegetable juices. Frozen vegetables in sauces. Salted [place] fries. Olives. Angie Fava. Relishes. Sauerkraut. Salsa. Meat and Other Protein Products Salted, canned, smoked, spiced, or pickled meats, seafood, or fish. Bacon, ham, sausage, hot dogs, corned beef, chipped beef, and packaged luncheon meats. Salt pork. Jerky. Pickled herring. Anchovies, regular canned tuna, and sardines. Salted nuts. Dairy Processed cheese and cheese spreads. Cheese curds. Blue cheese and cottage cheese. Buttermilk.  Condiments Onion and garlic salt, seasoned salt, table salt, and sea salt. Canned and packaged gravies. Worcestershire sauce. Tartar sauce. Barbecue sauce. Teriyaki sauce. Soy sauce, including reduced sodium. Steak sauce. Fish sauce. Oyster sauce. Cocktail sauce. Horseradish that you find on the shelf. Regular ketchup and mustard. Meat flavorings and tenderizers. Bouillon cubes. Hot sauce. Tabasco sauce. Marinades. Taco seasonings. Relishes. Fats and Oils Regular salad dressings. Salted butter. Margarine. Ghee. Bacon fat.  Other Potato and tortilla chips. Corn chips and puffs. Salted popcorn and pretzels. Canned or dried soups. Pizza. Frozen entrees and pot pies.  The items listed above may not be a complete list of foods and beverages to avoid. Contact your dietitian for more information.   This information is not intended to replace advice given to you by your health care provider. Make sure you discuss any questions you have with your health care provider.   Document Released: 10/10/2001  Document Revised: 05/11/2014 Document Reviewed: 02/22/2013 Elsevier Interactive Patient Education [employer].

## 2015-06-10 NOTE — Progress Notes (Signed)
Notified PA, Rowe Robert of hypertension.  Advise Pt to follow up with primary care provider regarding hypertension. Pt and wife verbalize understanding to take BP med ASAP upon arrival home and to follow up with primary provider today.

## 2015-06-10 NOTE — H&P (Signed)
Referring Physician(s): Mohamed,Mohamed  Chief Complaint:  "I'm here for a port a cath"  Subjective: Pt familiar to IR service from prior bone marrow biopsy and most recently left retroperitoneal mass biopsy on 12/26/14. He has a known history of multiple myeloma and presents again today for Port-A-Cath placement for additional chemotherapy. He currently denies fever, chills, headache, chest pain dyspnea, abdominal pain, nausea, vomiting or abnormal bleeding. He does have occasional cough as well as some left flank discomfort.   Allergies: Review of patient's allergies indicates no known allergies.  Medications: Prior to Admission medications   Medication Sig Start Date End Date Taking? Authorizing Provider  acetaminophen (TYLENOL) 325 MG tablet Take 2 tablets (650 mg total) by mouth every 4 (four) hours as needed for mild pain, fever or headache. 03/25/15  Yes Robbie Lis, MD  dexamethasone (DECADRON) 4 MG tablet 10 tablet by mouth every week starting with the first dose of chemotherapy. 04/02/15  Yes [name], MD  hydrALAZINE (APRESOLINE) 50 MG tablet Take 1 tablet (50 mg total) by mouth every 8 (eight) hours. 05/08/15  Yes Marletta Lor, MD  finasteride (PROSCAR) 5 MG tablet Take 1 tablet (5 mg total) by mouth daily. 02/23/15   Theodis Blaze, MD  HYDROcodone-acetaminophen (NORCO/VICODIN) 5-325 MG tablet Take 1-2 tablets by mouth every 4 (four) hours as needed for moderate pain. Patient not taking: Reported on 05/09/2015 02/23/15   Theodis Blaze, MD  lidocaine-prilocaine (EMLA) cream Apply 1 application topically as needed. 06/05/15   [name], MD  prochlorperazine (COMPAZINE) 10 MG tablet Take 1 tablet (10 mg total) by mouth every 6 (six) hours as needed for nausea or vomiting. 04/08/15   [name], MD  tamsulosin (FLOMAX) 0.4 MG CAPS capsule Take 1 capsule (0.4 mg total) by mouth daily. Patient taking differently: Take 0.4 mg by mouth daily. Take 2 pills per  day one in the morning and 1 in the evening 02/23/15   Theodis Blaze, MD     Vital Signs: BP 177/117 mmHg  Pulse 97  Temp(Src) 98.6 F (37 C) (Oral)  Resp 18  SpO2 100%  Physical Exam patient awake, alert. Chest clear to auscultation bilaterally. Heart with regular rate and rhythm. Abdomen soft, positive bowel sounds, nontender. Extremities- full range of motion and no edema.  Imaging: No results found.  Labs:  CBC:  Recent Labs  05/15/15 0840 05/22/15 0937 06/05/15 0855 06/10/15 0747  WBC 4.9 4.9 4.3 4.6  HGB 8.0* 9.7* 8.9* 8.4*  HCT 24.5* 29.8* 26.3* 24.7*  PLT 102* 120* 162 134*    COAGS:  Recent Labs  10/02/14 0730 12/26/14 0915  02/23/15 0508 03/20/15 1640 03/23/15 0510 04/02/15 1414  INR 1.13 1.09  < > 1.20 1.16 1.18 1.00*  APTT 34 30  --   --  37  --   --   < > = values in this interval not displayed.  BMP:  Recent Labs  03/20/15 1640 03/21/15 0427 03/23/15 0510 03/25/15 0432  05/08/15 1345 05/15/15 0840 05/22/15 0937 06/05/15 0855  NA 135 138 140 138  < > 135* 137 139 137  K 4.3 4.7 4.6 4.6  < > 4.4 4.8 4.4 4.3  CL 106 111 109 108  --   --   --   --   --   CO2 20* 20* 21* 21*  < > 19* 18* 19* 18*  GLUCOSE 121* 119* 112* 106*  < > 101 101 130 115  BUN 30* 27* 26* 28*  < > 27.9* 27.7* 26.3* 23.5  CALCIUM 9.0 8.8* 9.1 8.9  < > 9.2 9.2 9.1 9.2  CREATININE 2.03* 2.13* 2.19* 2.18*  < > 2.4* 2.2* 2.3* 2.4*  GFRNONAA 32* 30* 29* 30*  --   --   --   --   --   GFRAA 37* 35* 34* 34*  --   --   --   --   --   < > = values in this interval not displayed.  LIVER FUNCTION TESTS:  Recent Labs  05/08/15 1345 05/15/15 0840 05/22/15 0937 06/05/15 0855  BILITOT 1.41* 0.95 1.02 1.10  AST '16 9 13 15  '$ ALT 12 '9 9 12  '$ ALKPHOS 60 52 54 50  PROT 8.3 7.7 7.7 8.0  ALBUMIN 3.8 3.4* 3.6 3.6    Assessment and Plan: Pt with known history of multiple myeloma ; plan today is  for Port-A-Cath placement for additional chemotherapy.Risks and benefits  discussed with the patient/wife including, but not limited to bleeding, infection, pneumothorax, or fibrin sheath development and need for additional procedures.All of the patient's questions were answered, patient is agreeable to proceed.Consent signed and in chart.     Electronically Signed: D. Rowe Robert 06/10/2015, 8:28 AM   I spent a total of 15 minutes at the the patient's bedside AND on the patient's hospital floor or unit, greater than 50% of which was counseling/coordinating care for Port-A-Cath placement

## 2015-06-10 NOTE — Progress Notes (Signed)
Subjective:    Patient ID: Wesley [name], male    DOB: 1945-12-19, 70 y.o.   MRN: [id number]  HPI  70 year old patient who has a history of hypertension and chronic kidney disease and myeloma kidney.  He has been on  Apresoline 50 mg 3 times a day for  Blood pressure control.   He is on weekly Decadron dosing 40 mg as part of his anti-neoplastic chemotherapy regimen.  He admits that he is not very compliant with a restricted salt diet and yesterday does recall liberal use of salt with chicken meal.  He had a Port-A-Cath placed earlier today and because of this procedure did not take hydralazine.  Blood pressure readings were quite high.  Pre-and post procedure.  The patient generally feels well today.  He states blood pressures are generally in the 409-811 systolic range with diastolics always in the eighties  He generally feels well today  Wt Readings from Last 3 Encounters:  06/10/15 194 lb (87.998 kg)  06/05/15 193 lb 6.4 oz (87.726 kg)  05/09/15 195 lb 3.2 oz (88.542 kg)    BP Readings from Last 3 Encounters:  06/10/15 172/110  06/10/15 170/109  06/06/15 146/89   Past Medical History  Diagnosis Date  . Pulmonary nodule, right     W/  CHEST WALL MASS--  SCHEDULED FOR BIOPSY W/ DR [name] ON FRIDAY 10-27-2013  . History of seizures as a child     AGE 52 OR 8--- NONE ISSUE SINCE  . History of GI bleed     SECONDARY TO ULCER --  DEC 2011  RESOLVED PER LAST EGD 07-05-2010  . Bladder tumor   . Hematuria   . Radiation 12/07/13-12/27/13    right anterior chest 37.5 gray  . Fracture of radius 09/29/14    left  . Cancer ([place])     multiple myeloma, bladder cancer 2015  . Encounter for antineoplastic chemotherapy 05/12/2015    Social History   Social History  . Marital Status: Married    Spouse Name: N/A  . Number of Children: 2  . Years of Education: N/A   Occupational History  . retired    Social History Main Topics  . Smoking status: Former Smoker -- 0.10 packs/day for 1  years    Types: Cigarettes    Quit date: 05/04/1969  . Smokeless tobacco: Never Used  . Alcohol Use: No  . Drug Use: No  . Sexual Activity: Not on file   Other Topics Concern  . Not on file   Social History Narrative    Past Surgical History  Procedure Laterality Date  . Excision benign left axilla tumor  1993  . Esophagogastroduodenoscopy  X5   LAST ONE 07-05-2010  . Transurethral resection of bladder tumor with gyrus (turbt-gyrus) N/A 10/25/2013    Procedure: TRANSURETHRAL RESECTION OF BLADDER TUMOR WITH GYRUS (TURBT-GYRUS);  Surgeon: Sharyn Creamer, MD;  Location: [facility];  Service: Urology;  Laterality: N/A;  . Cystoscopy with biopsy N/A 10/25/2013    Procedure: CYSTOSCOPY;  Surgeon: Sharyn Creamer, MD;  Location: [facility];  Service: Urology;  Laterality: N/A;  . Bone biopsy Right 11/14/13     right seventh rib lesion  . Orif radial fracture Left 10/22/2014    Procedure: Intramedullary nailing left radius fracture;  Surgeon: Milly Jakob, MD;  Location: [facility];  Service: Orthopedics;  Laterality: Left;  OPEN TREATMENT LEFT RADIUS FRACTURE  . Cystoscopy with retrograde pyelogram, ureteroscopy and  stent placement N/A 02/21/2015    Procedure: CYSTOSCOPY, EXAM UNDER ANESTHESIA, FOLEY CATHETER PLACEMENT;  Surgeon: Jerilee Field, MD;  Location: [facility];  Service: Urology;  Laterality: N/A;    Family History  Problem Relation Age of Onset  . Heart disease Mother   . Cancer - Prostate Brother     No Known Allergies  Current Outpatient Prescriptions on File Prior to Visit  Medication Sig Dispense Refill  . acetaminophen (TYLENOL) 325 MG tablet Take 2 tablets (650 mg total) by mouth every 4 (four) hours as needed for mild pain, fever or headache. 30 tablet 0  . dexamethasone (DECADRON) 4 MG tablet 10 tablet by mouth every week starting with the first dose of chemotherapy. 80 tablet 2  . finasteride (PROSCAR) 5 MG tablet  Take 1 tablet (5 mg total) by mouth daily. 30 tablet 1  . hydrALAZINE (APRESOLINE) 50 MG tablet Take 1 tablet (50 mg total) by mouth every 8 (eight) hours. 90 tablet 1  . HYDROcodone-acetaminophen (NORCO/VICODIN) 5-325 MG tablet Take 1-2 tablets by mouth every 4 (four) hours as needed for moderate pain. 65 tablet 0  . lidocaine-prilocaine (EMLA) cream Apply 1 application topically as needed. 30 g 0  . prochlorperazine (COMPAZINE) 10 MG tablet Take 1 tablet (10 mg total) by mouth every 6 (six) hours as needed for nausea or vomiting. 30 tablet 0  . tamsulosin (FLOMAX) 0.4 MG CAPS capsule Take 1 capsule (0.4 mg total) by mouth daily. (Patient taking differently: Take 0.4 mg by mouth daily. Take 2 pills per day one in the morning and 1 in the evening) 30 capsule 1   Current Facility-Administered Medications on File Prior to Visit  Medication Dose Route Frequency Provider Last Rate Last Dose  . 0.9 %  sodium chloride infusion   Intravenous Continuous Barnetta Chapel, PA-C 10 mL/hr at 06/10/15 0800    . lidocaine (XYLOCAINE) 1 % (with pres) injection           . lidocaine-EPINEPHrine (XYLOCAINE W/EPI) 2 %-1:100000 (with pres) injection             BP 172/110 mmHg  Pulse 100  Temp(Src) 98.4 F (36.9 C) (Oral)  Resp 20  Ht 5\' 8"  (1.727 m)  Wt 194 lb (87.998 kg)  BMI 29.50 kg/m2  SpO2 98%       Review of Systems  Constitutional: Negative for fever, chills, appetite change and fatigue.  HENT: Negative for congestion, dental problem, ear pain, hearing loss, sore throat, tinnitus, trouble swallowing and voice change.   Eyes: Negative for pain, discharge and visual disturbance.  Respiratory: Negative for cough, chest tightness, wheezing and stridor.   Cardiovascular: Negative for chest pain, palpitations and leg swelling.  Gastrointestinal: Negative for nausea, vomiting, abdominal pain, diarrhea, constipation, blood in stool and abdominal distention.  Genitourinary: Negative for urgency,  hematuria, flank pain, discharge, difficulty urinating and genital sores.  Musculoskeletal: Negative for myalgias, back pain, joint swelling, arthralgias, gait problem and neck stiffness.  Skin: Negative for rash.  Neurological: Negative for dizziness, syncope, speech difficulty, weakness, numbness and headaches.  Hematological: Negative for adenopathy. Does not bruise/bleed easily.  Psychiatric/Behavioral: Negative for behavioral problems and dysphoric mood. The patient is not nervous/anxious.        Objective:   Physical Exam  Constitutional: He is oriented to person, place, and time. He appears well-developed.   Blood pressure 165/105  HENT:  Head: Normocephalic.  Right Ear: External ear normal.  Left Ear: External ear normal.  Eyes: Conjunctivae  and EOM are normal.  Neck: Normal range of motion.  Cardiovascular: Normal rate and normal heart sounds.   Pulmonary/Chest: Breath sounds normal. No respiratory distress. He has no wheezes.  Abdominal: Bowel sounds are normal.  Musculoskeletal: Normal range of motion. He exhibits no edema or tenderness.  Neurological: He is alert and oriented to person, place, and time.  Psychiatric: He has a normal mood and affect. His behavior is normal.          Assessment & Plan:    hypertension, poor control.   Frequent home blood pressure monitoring.  Encouraged.  Low-salt diet recommended and dietary instructions dispensed.  He has been asked to keep a home blood pressure diary and to follow-up next week for further evaluation.  Amlodipine 5 mg daily will be added to his regimen.  In view of his chronic kidney disease.  We'll attempt to avoid diuretic therapy  He will resume Apresoline 3 times daily.  Will consider Catapres next week.  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

## 2015-06-10 NOTE — Progress Notes (Signed)
NAME ALERT:  Pt states his twin brother is in the system and is currently a Pt at [facility], with same birthday and similar name.

## 2015-06-11 ENCOUNTER — Encounter (HOSPITAL_COMMUNITY): Admission: RE | Payer: Self-pay | Source: Ambulatory Visit

## 2015-06-11 ENCOUNTER — Ambulatory Visit (HOSPITAL_COMMUNITY): Admission: RE | Admit: 2015-06-11 | Payer: Medicare HMO | Source: Ambulatory Visit | Admitting: Urology

## 2015-06-11 SURGERY — CYSTOSCOPY, WITH BLADDER CALCULUS LITHOLAPAXY
Anesthesia: General

## 2015-06-12 ENCOUNTER — Ambulatory Visit (HOSPITAL_BASED_OUTPATIENT_CLINIC_OR_DEPARTMENT_OTHER): Payer: Medicare HMO

## 2015-06-12 ENCOUNTER — Other Ambulatory Visit (HOSPITAL_BASED_OUTPATIENT_CLINIC_OR_DEPARTMENT_OTHER): Payer: Medicare HMO

## 2015-06-12 VITALS — BP 133/7 | HR 102 | Temp 98.0°F | Resp 20

## 2015-06-12 DIAGNOSIS — C9000 Multiple myeloma not having achieved remission: Secondary | ICD-10-CM

## 2015-06-12 DIAGNOSIS — N08 Glomerular disorders in diseases classified elsewhere: Principal | ICD-10-CM

## 2015-06-12 DIAGNOSIS — Z5112 Encounter for antineoplastic immunotherapy: Secondary | ICD-10-CM

## 2015-06-12 DIAGNOSIS — Z5111 Encounter for antineoplastic chemotherapy: Secondary | ICD-10-CM | POA: Diagnosis not present

## 2015-06-12 LAB — COMPREHENSIVE METABOLIC PANEL
ALT: <9 U/L (ref 0–55)
AST: 12 U/L (ref 5–34)
Albumin: 3.5 g/dL (ref 3.5–5.0)
Alkaline Phosphatase: 47 U/L (ref 40–150)
Anion Gap: 10 mEq/L (ref 3–11)
BUN: 29.7 mg/dL — AB (ref 7.0–26.0)
CO2: 16 meq/L — AB (ref 22–29)
Calcium: 9 mg/dL (ref 8.4–10.4)
Chloride: 112 mEq/L — ABNORMAL HIGH (ref 98–109)
Creatinine: 2.6 mg/dL — ABNORMAL HIGH (ref 0.7–1.3)
EGFR: 28 mL/min/{1.73_m2} — AB (ref >90)
GLUCOSE: 144 mg/dL — AB (ref 70–140)
POTASSIUM: 4.4 meq/L (ref 3.5–5.1)
SODIUM: 138 meq/L (ref 136–145)
TOTAL PROTEIN: 8 g/dL (ref 6.4–8.3)
Total Bilirubin: 1.14 mg/dL (ref 0.20–1.20)

## 2015-06-12 LAB — CBC WITH DIFFERENTIAL/PLATELET
BASO%: 0.3 % (ref 0.0–2.0)
Basophils Absolute: 0 10*3/uL (ref 0.0–0.1)
EOS ABS: 0.2 10*3/uL (ref 0.0–0.5)
EOS%: 4.4 % (ref 0.0–7.0)
HCT: 26 % — ABNORMAL LOW (ref 38.4–49.9)
HGB: 8.8 g/dL — ABNORMAL LOW (ref 13.0–17.1)
LYMPH%: 24.8 % (ref 14.0–49.0)
MCH: 31.8 pg (ref 27.2–33.4)
MCHC: 33.8 g/dL (ref 32.0–36.0)
MCV: 93.9 fL (ref 79.3–98.0)
MONO#: 0.4 10*3/uL (ref 0.1–0.9)
MONO%: 10.1 % (ref 0.0–14.0)
NEUT%: 60.4 % (ref 39.0–75.0)
NEUTROS ABS: 2.3 10*3/uL (ref 1.5–6.5)
Platelets: 101 10*3/uL — ABNORMAL LOW (ref 140–400)
RBC: 2.77 10*6/uL — AB (ref 4.20–5.82)
RDW: 17 % — AB (ref 11.0–14.6)
WBC: 3.9 10*3/uL — AB (ref 4.0–10.3)
lymph#: 1 10*3/uL (ref 0.9–3.3)

## 2015-06-12 MED ORDER — SODIUM CHLORIDE 0.9 % IJ SOLN
10.0000 mL | INTRAMUSCULAR | Status: DC | PRN
  Administered 2015-06-12: 10 mL
  Filled 2015-06-12: qty 10

## 2015-06-12 MED ORDER — SODIUM CHLORIDE 0.9 % IV SOLN
Freq: Once | INTRAVENOUS | Status: AC
  Administered 2015-06-12: 11:00:00 via INTRAVENOUS
  Filled 2015-06-12: qty 4

## 2015-06-12 MED ORDER — SODIUM CHLORIDE 0.9 % IV SOLN
Freq: Once | INTRAVENOUS | Status: AC
  Administered 2015-06-12: 11:00:00 via INTRAVENOUS

## 2015-06-12 MED ORDER — CYCLOPHOSPHAMIDE CHEMO INJECTION 1 GM
300.0000 mg/m2 | Freq: Once | INTRAMUSCULAR | Status: AC
  Administered 2015-06-12: 620 mg via INTRAVENOUS
  Filled 2015-06-12: qty 31

## 2015-06-12 MED ORDER — DEXTROSE 5 % IV SOLN
36.0000 mg/m2 | Freq: Once | INTRAVENOUS | Status: AC
  Administered 2015-06-12: 76 mg via INTRAVENOUS
  Filled 2015-06-12: qty 38

## 2015-06-12 MED ORDER — HEPARIN SOD (PORK) LOCK FLUSH 100 UNIT/ML IV SOLN
500.0000 [IU] | Freq: Once | INTRAVENOUS | Status: AC | PRN
  Administered 2015-06-12: 500 [IU]
  Filled 2015-06-12: qty 5

## 2015-06-12 NOTE — Progress Notes (Signed)
Dr. [name] reviewed all lab results done today.  OK to treat as per Dr. [name].

## 2015-06-12 NOTE — Patient Instructions (Signed)
[facility] Discharge Instructions for Patients Receiving Chemotherapy  Today you received the following chemotherapy agents Kyprolis, Cytoxan  To help prevent nausea and vomiting after your treatment, we encourage you to take your nausea medication    If you develop nausea and vomiting that is not controlled by your nausea medication, call the clinic.   BELOW ARE SYMPTOMS THAT SHOULD BE REPORTED IMMEDIATELY:  *FEVER GREATER THAN 100.5 F  *CHILLS WITH OR WITHOUT FEVER  NAUSEA AND VOMITING THAT IS NOT CONTROLLED WITH YOUR NAUSEA MEDICATION  *UNUSUAL SHORTNESS OF BREATH  *UNUSUAL BRUISING OR BLEEDING  TENDERNESS IN MOUTH AND THROAT WITH OR WITHOUT PRESENCE OF ULCERS  *URINARY PROBLEMS  *BOWEL PROBLEMS  UNUSUAL RASH Items with * indicate a potential emergency and should be followed up as soon as possible.  Feel free to call the clinic you have any questions or concerns. The clinic phone number is [phone].  Please show the CHEMO ALERT CARD at check-in to the Emergency Department and triage nurse.   

## 2015-06-13 ENCOUNTER — Ambulatory Visit (HOSPITAL_BASED_OUTPATIENT_CLINIC_OR_DEPARTMENT_OTHER): Payer: Medicare HMO

## 2015-06-13 VITALS — BP 146/91 | HR 126 | Temp 98.0°F

## 2015-06-13 DIAGNOSIS — Z5112 Encounter for antineoplastic immunotherapy: Secondary | ICD-10-CM

## 2015-06-13 DIAGNOSIS — N08 Glomerular disorders in diseases classified elsewhere: Principal | ICD-10-CM

## 2015-06-13 DIAGNOSIS — C9000 Multiple myeloma not having achieved remission: Secondary | ICD-10-CM

## 2015-06-13 MED ORDER — SODIUM CHLORIDE 0.9 % IV SOLN
Freq: Once | INTRAVENOUS | Status: AC
  Administered 2015-06-13: 10:00:00 via INTRAVENOUS
  Filled 2015-06-13: qty 4

## 2015-06-13 MED ORDER — DEXTROSE 5 % IV SOLN
36.0000 mg/m2 | Freq: Once | INTRAVENOUS | Status: AC
  Administered 2015-06-13: 76 mg via INTRAVENOUS
  Filled 2015-06-13: qty 30

## 2015-06-13 MED ORDER — SODIUM CHLORIDE 0.9 % IV SOLN
Freq: Once | INTRAVENOUS | Status: AC
  Administered 2015-06-13: 10:00:00 via INTRAVENOUS

## 2015-06-13 MED ORDER — HEPARIN SOD (PORK) LOCK FLUSH 100 UNIT/ML IV SOLN
500.0000 [IU] | Freq: Once | INTRAVENOUS | Status: AC | PRN
  Administered 2015-06-13: 500 [IU]
  Filled 2015-06-13: qty 5

## 2015-06-13 MED ORDER — SODIUM CHLORIDE 0.9 % IJ SOLN
10.0000 mL | INTRAMUSCULAR | Status: DC | PRN
  Administered 2015-06-13: 10 mL
  Filled 2015-06-13: qty 10

## 2015-06-13 NOTE — Patient Instructions (Signed)
[facility] Discharge Instructions for Patients Receiving Chemotherapy  Today you received the following chemotherapy agents Kyprolis  To help prevent nausea and vomiting after your treatment, we encourage you to take your nausea medication    If you develop nausea and vomiting that is not controlled by your nausea medication, call the clinic.   BELOW ARE SYMPTOMS THAT SHOULD BE REPORTED IMMEDIATELY:  *FEVER GREATER THAN 100.5 F  *CHILLS WITH OR WITHOUT FEVER  NAUSEA AND VOMITING THAT IS NOT CONTROLLED WITH YOUR NAUSEA MEDICATION  *UNUSUAL SHORTNESS OF BREATH  *UNUSUAL BRUISING OR BLEEDING  TENDERNESS IN MOUTH AND THROAT WITH OR WITHOUT PRESENCE OF ULCERS  *URINARY PROBLEMS  *BOWEL PROBLEMS  UNUSUAL RASH Items with * indicate a potential emergency and should be followed up as soon as possible.  Feel free to call the clinic you have any questions or concerns. The clinic phone number is [phone].  Please show the CHEMO ALERT CARD at check-in to the Emergency Department and triage nurse.   

## 2015-06-17 ENCOUNTER — Encounter: Payer: Self-pay | Admitting: Internal Medicine

## 2015-06-17 ENCOUNTER — Telehealth: Payer: Self-pay | Admitting: Internal Medicine

## 2015-06-17 ENCOUNTER — Other Ambulatory Visit: Payer: Self-pay | Admitting: *Deleted

## 2015-06-17 ENCOUNTER — Ambulatory Visit (INDEPENDENT_AMBULATORY_CARE_PROVIDER_SITE_OTHER): Payer: Medicare HMO | Admitting: Internal Medicine

## 2015-06-17 VITALS — BP 150/90 | HR 98 | Temp 98.2°F | Resp 20 | Ht 68.0 in | Wt 195.0 lb

## 2015-06-17 DIAGNOSIS — I10 Essential (primary) hypertension: Secondary | ICD-10-CM

## 2015-06-17 DIAGNOSIS — N08 Glomerular disorders in diseases classified elsewhere: Secondary | ICD-10-CM

## 2015-06-17 DIAGNOSIS — N185 Chronic kidney disease, stage 5: Secondary | ICD-10-CM | POA: Diagnosis not present

## 2015-06-17 DIAGNOSIS — C9000 Multiple myeloma not having achieved remission: Secondary | ICD-10-CM | POA: Diagnosis not present

## 2015-06-17 DIAGNOSIS — N2889 Other specified disorders of kidney and ureter: Secondary | ICD-10-CM

## 2015-06-17 NOTE — Patient Instructions (Signed)
Limit your sodium (Salt) intake    It is important that you exercise regularly, at least 20 minutes 3 to 4 times per week.  If you develop chest pain or shortness of breath seek  medical attention.  Return in 3 months for follow-up  

## 2015-06-17 NOTE — Progress Notes (Signed)
Pre visit review using our clinic review tool, if applicable. No additional management support is needed unless otherwise documented below in the visit note. 

## 2015-06-17 NOTE — Progress Notes (Signed)
Subjective:    Patient ID: Wesley [name], male    DOB: [birth year], 70 y.o.   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  He also has a history of bilateral hydronephrosis secondary to BPH. Amlodipine added to his regimen of apresoline.  He continues to feel well.  Only issue some mild chronic constipation. He does not have his blood pressure diary, but states his systolic readings have been 417-408 and diastolic readings 14-481.  Blood pressure arrival 150 over 90  Past Medical History  Diagnosis Date  . Pulmonary nodule, right     W/  CHEST WALL MASS--  SCHEDULED FOR BIOPSY W/ DR [name] ON FRIDAY 10-27-2013  . History of seizures as a child     AGE 70 OR 8--- NONE ISSUE SINCE  . History of GI bleed     SECONDARY TO ULCER --  DEC 2011  RESOLVED PER LAST EGD 07-05-2010  . Bladder tumor   . Hematuria   . Radiation 12/07/13-12/27/13    right anterior chest 37.5 gray  . Fracture of radius 09/29/14    left  . Cancer ([place])     multiple myeloma, bladder cancer 2015  . Encounter for antineoplastic chemotherapy 05/12/2015    Social History   Social History  . Marital Status: Married    Spouse Name: N/A  . Number of Children: 2  . Years of Education: N/A   Occupational History  . retired    Social History Main Topics  . Smoking status: Former Smoker -- 0.10 packs/day for 1 years    Types: Cigarettes    Quit date: 05/04/1969  . Smokeless tobacco: Never Used  . Alcohol Use: No  . Drug Use: No  . Sexual Activity: Not on file   Other Topics Concern  . Not on file   Social History Narrative    Past Surgical History  Procedure Laterality Date  . Excision benign left axilla tumor  1993  . Esophagogastroduodenoscopy  X5   LAST ONE 07-05-2010  . Transurethral resection of bladder tumor with gyrus (turbt-gyrus) N/A 10/25/2013    Procedure: TRANSURETHRAL RESECTION OF BLADDER TUMOR WITH GYRUS  (TURBT-GYRUS);  Surgeon: Sharyn Creamer, MD;  Location: [facility];  Service: Urology;  Laterality: N/A;  . Cystoscopy with biopsy N/A 10/25/2013    Procedure: CYSTOSCOPY;  Surgeon: Sharyn Creamer, MD;  Location: [facility];  Service: Urology;  Laterality: N/A;  . Bone biopsy Right 11/14/13     right seventh rib lesion  . Orif radial fracture Left 10/22/2014    Procedure: Intramedullary nailing left radius fracture;  Surgeon: Milly Jakob, MD;  Location: [facility];  Service: Orthopedics;  Laterality: Left;  OPEN TREATMENT LEFT RADIUS FRACTURE  . Cystoscopy with retrograde pyelogram, ureteroscopy and stent placement N/A 02/21/2015    Procedure: CYSTOSCOPY, EXAM UNDER ANESTHESIA, FOLEY CATHETER PLACEMENT;  Surgeon: Festus Aloe, MD;  Location: [facility];  Service: Urology;  Laterality: N/A;    Family History  Problem Relation Age of Onset  . Heart disease Mother   . Cancer - Prostate Brother     No Known Allergies  Current Outpatient Prescriptions on File Prior to Visit  Medication Sig Dispense Refill  . acetaminophen (TYLENOL) 325 MG tablet Take 2 tablets (650 mg total) by mouth every 4 (four) hours as needed for mild pain, fever or headache. 30 tablet 0  .  amLODipine (NORVASC) 5 MG tablet Take 1 tablet (5 mg total) by mouth daily. 90 tablet 3  . dexamethasone (DECADRON) 4 MG tablet 10 tablet by mouth every week starting with the first dose of chemotherapy. 80 tablet 2  . finasteride (PROSCAR) 5 MG tablet Take 1 tablet (5 mg total) by mouth daily. 30 tablet 1  . hydrALAZINE (APRESOLINE) 50 MG tablet Take 1 tablet (50 mg total) by mouth every 8 (eight) hours. 90 tablet 1  . HYDROcodone-acetaminophen (NORCO/VICODIN) 5-325 MG tablet Take 1-2 tablets by mouth every 4 (four) hours as needed for moderate pain. 65 tablet 0  . lidocaine-prilocaine (EMLA) cream Apply 1 application topically as needed. 30 g 0  . prochlorperazine (COMPAZINE) 10 MG  tablet Take 1 tablet (10 mg total) by mouth every 6 (six) hours as needed for nausea or vomiting. 30 tablet 0  . tamsulosin (FLOMAX) 0.4 MG CAPS capsule Take 1 capsule (0.4 mg total) by mouth daily. (Patient taking differently: Take 0.4 mg by mouth daily. Take 2 pills per day one in the morning and 1 in the evening) 30 capsule 1   No current facility-administered medications on file prior to visit.    BP 150/90 mmHg  Pulse 98  Temp(Src) 98.2 F (36.8 C) (Oral)  Resp 20  Ht '5\' 8"'$  (1.727 m)  Wt 195 lb (88.451 kg)  BMI 29.66 kg/m2  SpO2 98%     Review of Systems  Constitutional: Negative for fever, chills, appetite change and fatigue.  HENT: Negative for congestion, dental problem, ear pain, hearing loss, sore throat, tinnitus, trouble swallowing and voice change.   Eyes: Negative for pain, discharge and visual disturbance.  Respiratory: Negative for cough, chest tightness, wheezing and stridor.   Cardiovascular: Negative for chest pain, palpitations and leg swelling.  Gastrointestinal: Negative for nausea, vomiting, abdominal pain, diarrhea, constipation, blood in stool and abdominal distention.  Genitourinary: Negative for urgency, hematuria, flank pain, discharge, difficulty urinating and genital sores.  Musculoskeletal: Negative for myalgias, back pain, joint swelling, arthralgias, gait problem and neck stiffness.  Skin: Negative for rash.  Neurological: Negative for dizziness, syncope, speech difficulty, weakness, numbness and headaches.  Hematological: Negative for adenopathy. Does not bruise/bleed easily.  Psychiatric/Behavioral: Negative for behavioral problems and dysphoric mood. The patient is not nervous/anxious.        Objective:   Physical Exam  Constitutional: He appears well-developed and well-nourished. No distress.  Systolic blood pressure 509-326 Diastolic readings 71-24          Assessment & Plan:   Hypertension.  No change in therapy.  Continue salt  restricted diet Chronic kidney disease.  Follow-up renal medicine Multiple myeloma.  Follow-up oncology

## 2015-06-17 NOTE — Telephone Encounter (Signed)
Patient wife called to check on patient tx appointments being scheduled for 2/14 and 2/16 as they are usually 2 days in a row. Infusion area is at capacity 2/15 and patient scheduled for day 2 tx on 2/16. Wife informed. Also patient needs wkly labs. Added lab for 2/14 - wife aware. Copy of pof to scheduler to add remaining wkly lab appointments. Wife will get new schedule tomorrow.

## 2015-06-17 NOTE — Telephone Encounter (Signed)
Opened in error

## 2015-06-17 NOTE — Telephone Encounter (Signed)
perp of to sch weekly labs-per note from [place] pt to get updated copy of new sch

## 2015-06-18 ENCOUNTER — Ambulatory Visit (HOSPITAL_BASED_OUTPATIENT_CLINIC_OR_DEPARTMENT_OTHER): Payer: Medicare HMO

## 2015-06-18 ENCOUNTER — Other Ambulatory Visit (HOSPITAL_BASED_OUTPATIENT_CLINIC_OR_DEPARTMENT_OTHER): Payer: Medicare HMO

## 2015-06-18 VITALS — BP 160/94 | HR 102 | Temp 98.9°F | Resp 18

## 2015-06-18 DIAGNOSIS — C9000 Multiple myeloma not having achieved remission: Secondary | ICD-10-CM

## 2015-06-18 DIAGNOSIS — Z5111 Encounter for antineoplastic chemotherapy: Secondary | ICD-10-CM

## 2015-06-18 DIAGNOSIS — N08 Glomerular disorders in diseases classified elsewhere: Principal | ICD-10-CM

## 2015-06-18 DIAGNOSIS — Z5112 Encounter for antineoplastic immunotherapy: Secondary | ICD-10-CM

## 2015-06-18 LAB — CBC WITH DIFFERENTIAL/PLATELET
BASO%: 0.4 % (ref 0.0–2.0)
BASOS ABS: 0 10*3/uL (ref 0.0–0.1)
EOS ABS: 0.2 10*3/uL (ref 0.0–0.5)
EOS%: 3 % (ref 0.0–7.0)
HEMATOCRIT: 26.1 % — AB (ref 38.4–49.9)
HEMOGLOBIN: 8.6 g/dL — AB (ref 13.0–17.1)
LYMPH#: 0.3 10*3/uL — AB (ref 0.9–3.3)
LYMPH%: 4 % — ABNORMAL LOW (ref 14.0–49.0)
MCH: 31.8 pg (ref 27.2–33.4)
MCHC: 32.9 g/dL (ref 32.0–36.0)
MCV: 96.7 fL (ref 79.3–98.0)
MONO#: 0.8 10*3/uL (ref 0.1–0.9)
MONO%: 13.4 % (ref 0.0–14.0)
NEUT#: 5 10*3/uL (ref 1.5–6.5)
NEUT%: 79.2 % — AB (ref 39.0–75.0)
PLATELETS: 106 10*3/uL — AB (ref 140–400)
RBC: 2.7 10*6/uL — ABNORMAL LOW (ref 4.20–5.82)
RDW: 18.6 % — AB (ref 11.0–14.6)
WBC: 6.3 10*3/uL (ref 4.0–10.3)

## 2015-06-18 LAB — COMPREHENSIVE METABOLIC PANEL
ALBUMIN: 3.6 g/dL (ref 3.5–5.0)
ALK PHOS: 51 U/L (ref 40–150)
ALT: <9 U/L (ref 0–55)
ANION GAP: 7 meq/L (ref 3–11)
AST: 13 U/L (ref 5–34)
BUN: 26.1 mg/dL — AB (ref 7.0–26.0)
CALCIUM: 9 mg/dL (ref 8.4–10.4)
CO2: 20 mEq/L — ABNORMAL LOW (ref 22–29)
CREATININE: 2.5 mg/dL — AB (ref 0.7–1.3)
Chloride: 108 mEq/L (ref 98–109)
EGFR: 30 mL/min/{1.73_m2} — ABNORMAL LOW (ref >90)
Glucose: 112 mg/dl (ref 70–140)
POTASSIUM: 4.5 meq/L (ref 3.5–5.1)
Sodium: 135 mEq/L — ABNORMAL LOW (ref 136–145)
Total Bilirubin: 1.2 mg/dL (ref 0.20–1.20)
Total Protein: 8.4 g/dL — ABNORMAL HIGH (ref 6.4–8.3)

## 2015-06-18 MED ORDER — SODIUM CHLORIDE 0.9 % IV SOLN
Freq: Once | INTRAVENOUS | Status: AC
  Administered 2015-06-18: 14:00:00 via INTRAVENOUS
  Filled 2015-06-18: qty 4

## 2015-06-18 MED ORDER — SODIUM CHLORIDE 0.9 % IV SOLN
300.0000 mg/m2 | Freq: Once | INTRAVENOUS | Status: AC
  Administered 2015-06-18: 620 mg via INTRAVENOUS
  Filled 2015-06-18: qty 31

## 2015-06-18 MED ORDER — CARFILZOMIB CHEMO INJECTION 60 MG
36.0000 mg/m2 | Freq: Once | INTRAVENOUS | Status: AC
  Administered 2015-06-18: 76 mg via INTRAVENOUS
  Filled 2015-06-18: qty 30

## 2015-06-18 MED ORDER — SODIUM CHLORIDE 0.9 % IV SOLN
Freq: Once | INTRAVENOUS | Status: AC
  Administered 2015-06-18: 13:00:00 via INTRAVENOUS

## 2015-06-18 MED ORDER — SODIUM CHLORIDE 0.9 % IJ SOLN
10.0000 mL | INTRAMUSCULAR | Status: DC | PRN
  Administered 2015-06-18: 10 mL
  Filled 2015-06-18: qty 10

## 2015-06-18 MED ORDER — HEPARIN SOD (PORK) LOCK FLUSH 100 UNIT/ML IV SOLN
500.0000 [IU] | Freq: Once | INTRAVENOUS | Status: AC | PRN
  Administered 2015-06-18: 500 [IU]
  Filled 2015-06-18: qty 5

## 2015-06-18 MED ORDER — SODIUM CHLORIDE 0.9 % IV SOLN: Freq: Once | INTRAVENOUS | Status: DC

## 2015-06-18 NOTE — Patient Instructions (Signed)
[facility] Discharge Instructions for Patients Receiving Chemotherapy  Today you received the following chemotherapy agents Kyprolis, Cytoxan  To help prevent nausea and vomiting after your treatment, we encourage you to take your nausea medication    If you develop nausea and vomiting that is not controlled by your nausea medication, call the clinic.   BELOW ARE SYMPTOMS THAT SHOULD BE REPORTED IMMEDIATELY:  *FEVER GREATER THAN 100.5 F  *CHILLS WITH OR WITHOUT FEVER  NAUSEA AND VOMITING THAT IS NOT CONTROLLED WITH YOUR NAUSEA MEDICATION  *UNUSUAL SHORTNESS OF BREATH  *UNUSUAL BRUISING OR BLEEDING  TENDERNESS IN MOUTH AND THROAT WITH OR WITHOUT PRESENCE OF ULCERS  *URINARY PROBLEMS  *BOWEL PROBLEMS  UNUSUAL RASH Items with * indicate a potential emergency and should be followed up as soon as possible.  Feel free to call the clinic you have any questions or concerns. The clinic phone number is [phone].  Please show the CHEMO ALERT CARD at check-in to the Emergency Department and triage nurse.   

## 2015-06-18 NOTE — Progress Notes (Signed)
Labs reviewed by Dr. [name] and okay to proceed with Cytoxan and Kyprolis today.

## 2015-06-20 ENCOUNTER — Ambulatory Visit (HOSPITAL_BASED_OUTPATIENT_CLINIC_OR_DEPARTMENT_OTHER): Payer: Medicare HMO

## 2015-06-20 VITALS — BP 125/75 | HR 114 | Temp 98.4°F | Resp 20

## 2015-06-20 DIAGNOSIS — N08 Glomerular disorders in diseases classified elsewhere: Principal | ICD-10-CM

## 2015-06-20 DIAGNOSIS — Z5112 Encounter for antineoplastic immunotherapy: Secondary | ICD-10-CM | POA: Diagnosis not present

## 2015-06-20 DIAGNOSIS — C9000 Multiple myeloma not having achieved remission: Secondary | ICD-10-CM

## 2015-06-20 MED ORDER — DEXTROSE 5 % IV SOLN
36.0000 mg/m2 | Freq: Once | INTRAVENOUS | Status: AC
  Administered 2015-06-20: 76 mg via INTRAVENOUS
  Filled 2015-06-20: qty 15

## 2015-06-20 MED ORDER — SODIUM CHLORIDE 0.9 % IV SOLN
Freq: Once | INTRAVENOUS | Status: AC
  Administered 2015-06-20: 10:00:00 via INTRAVENOUS

## 2015-06-20 MED ORDER — HEPARIN SOD (PORK) LOCK FLUSH 100 UNIT/ML IV SOLN
500.0000 [IU] | Freq: Once | INTRAVENOUS | Status: AC | PRN
  Administered 2015-06-20: 500 [IU]
  Filled 2015-06-20: qty 5

## 2015-06-20 MED ORDER — SODIUM CHLORIDE 0.9 % IV SOLN
Freq: Once | INTRAVENOUS | Status: AC
  Administered 2015-06-20: 10:00:00 via INTRAVENOUS
  Filled 2015-06-20: qty 4

## 2015-06-20 MED ORDER — SODIUM CHLORIDE 0.9 % IJ SOLN
10.0000 mL | INTRAMUSCULAR | Status: DC | PRN
  Administered 2015-06-20: 10 mL
  Filled 2015-06-20: qty 10

## 2015-06-20 NOTE — Patient Instructions (Signed)
[facility] Discharge Instructions for Patients Receiving Chemotherapy  Today you received the following chemotherapy agents Kyprolis  To help prevent nausea and vomiting after your treatment, we encourage you to take your nausea medication    If you develop nausea and vomiting that is not controlled by your nausea medication, call the clinic.   BELOW ARE SYMPTOMS THAT SHOULD BE REPORTED IMMEDIATELY:  *FEVER GREATER THAN 100.5 F  *CHILLS WITH OR WITHOUT FEVER  NAUSEA AND VOMITING THAT IS NOT CONTROLLED WITH YOUR NAUSEA MEDICATION  *UNUSUAL SHORTNESS OF BREATH  *UNUSUAL BRUISING OR BLEEDING  TENDERNESS IN MOUTH AND THROAT WITH OR WITHOUT PRESENCE OF ULCERS  *URINARY PROBLEMS  *BOWEL PROBLEMS  UNUSUAL RASH Items with * indicate a potential emergency and should be followed up as soon as possible.  Feel free to call the clinic you have any questions or concerns. The clinic phone number is [phone].  Please show the CHEMO ALERT CARD at check-in to the Emergency Department and triage nurse.   

## 2015-06-26 ENCOUNTER — Emergency Department (HOSPITAL_COMMUNITY): Payer: Medicare HMO

## 2015-06-26 ENCOUNTER — Emergency Department (HOSPITAL_COMMUNITY)
Admission: EM | Admit: 2015-06-26 | Discharge: 2015-06-26 | Disposition: A | Discharge status: Home / Self Care | Payer: Medicare HMO | Attending: Emergency Medicine | Admitting: Emergency Medicine

## 2015-06-26 ENCOUNTER — Other Ambulatory Visit: Payer: Self-pay

## 2015-06-26 ENCOUNTER — Ambulatory Visit (HOSPITAL_BASED_OUTPATIENT_CLINIC_OR_DEPARTMENT_OTHER): Payer: Medicare HMO

## 2015-06-26 ENCOUNTER — Encounter (HOSPITAL_COMMUNITY): Payer: Self-pay

## 2015-06-26 ENCOUNTER — Other Ambulatory Visit (HOSPITAL_BASED_OUTPATIENT_CLINIC_OR_DEPARTMENT_OTHER): Payer: Medicare HMO

## 2015-06-26 DIAGNOSIS — R109 Unspecified abdominal pain: Secondary | ICD-10-CM | POA: Diagnosis not present

## 2015-06-26 DIAGNOSIS — Z85828 Personal history of other malignant neoplasm of skin: Secondary | ICD-10-CM | POA: Diagnosis not present

## 2015-06-26 DIAGNOSIS — C9000 Multiple myeloma not having achieved remission: Secondary | ICD-10-CM | POA: Diagnosis not present

## 2015-06-26 DIAGNOSIS — Z5111 Encounter for antineoplastic chemotherapy: Secondary | ICD-10-CM

## 2015-06-26 DIAGNOSIS — Z8781 Personal history of (healed) traumatic fracture: Secondary | ICD-10-CM | POA: Insufficient documentation

## 2015-06-26 DIAGNOSIS — Z87891 Personal history of nicotine dependence: Secondary | ICD-10-CM | POA: Diagnosis not present

## 2015-06-26 DIAGNOSIS — R079 Chest pain, unspecified: Secondary | ICD-10-CM | POA: Insufficient documentation

## 2015-06-26 DIAGNOSIS — N189 Chronic kidney disease, unspecified: Secondary | ICD-10-CM | POA: Diagnosis not present

## 2015-06-26 DIAGNOSIS — R0989 Other specified symptoms and signs involving the circulatory and respiratory systems: Secondary | ICD-10-CM | POA: Diagnosis not present

## 2015-06-26 DIAGNOSIS — Z452 Encounter for adjustment and management of vascular access device: Secondary | ICD-10-CM

## 2015-06-26 DIAGNOSIS — N08 Glomerular disorders in diseases classified elsewhere: Secondary | ICD-10-CM

## 2015-06-26 DIAGNOSIS — Z79899 Other long term (current) drug therapy: Secondary | ICD-10-CM | POA: Insufficient documentation

## 2015-06-26 DIAGNOSIS — Z923 Personal history of irradiation: Secondary | ICD-10-CM | POA: Diagnosis not present

## 2015-06-26 DIAGNOSIS — Z86018 Personal history of other benign neoplasm: Secondary | ICD-10-CM | POA: Insufficient documentation

## 2015-06-26 DIAGNOSIS — N185 Chronic kidney disease, stage 5: Secondary | ICD-10-CM

## 2015-06-26 DIAGNOSIS — D6481 Anemia due to antineoplastic chemotherapy: Secondary | ICD-10-CM

## 2015-06-26 DIAGNOSIS — R05 Cough: Secondary | ICD-10-CM | POA: Insufficient documentation

## 2015-06-26 DIAGNOSIS — Z95828 Presence of other vascular implants and grafts: Secondary | ICD-10-CM

## 2015-06-26 DIAGNOSIS — R61 Generalized hyperhidrosis: Secondary | ICD-10-CM | POA: Insufficient documentation

## 2015-06-26 DIAGNOSIS — T451X5A Adverse effect of antineoplastic and immunosuppressive drugs, initial encounter: Secondary | ICD-10-CM

## 2015-06-26 LAB — CBC
HCT: 23.8 % — ABNORMAL LOW (ref 39.0–52.0)
HEMOGLOBIN: 8 g/dL — AB (ref 13.0–17.0)
MCH: 32.5 pg (ref 26.0–34.0)
MCHC: 33.6 g/dL (ref 30.0–36.0)
MCV: 96.7 fL (ref 78.0–100.0)
Platelets: 123 10*3/uL — ABNORMAL LOW (ref 150–400)
RBC: 2.46 MIL/uL — ABNORMAL LOW (ref 4.22–5.81)
RDW: 17.7 % — ABNORMAL HIGH (ref 11.5–15.5)
WBC: 4.8 10*3/uL (ref 4.0–10.5)

## 2015-06-26 LAB — CBC WITH DIFFERENTIAL/PLATELET
BASO%: 0.2 % (ref 0.0–2.0)
Basophils Absolute: 0 10*3/uL (ref 0.0–0.1)
EOS%: 2.6 % (ref 0.0–7.0)
Eosinophils Absolute: 0.1 10*3/uL (ref 0.0–0.5)
HEMATOCRIT: 22 % — AB (ref 38.4–49.9)
HEMOGLOBIN: 7.5 g/dL — AB (ref 13.0–17.1)
LYMPH#: 1.1 10*3/uL (ref 0.9–3.3)
LYMPH%: 23 % (ref 14.0–49.0)
MCH: 32.3 pg (ref 27.2–33.4)
MCHC: 34.1 g/dL (ref 32.0–36.0)
MCV: 94.8 fL (ref 79.3–98.0)
MONO#: 0.4 10*3/uL (ref 0.1–0.9)
MONO%: 8.3 % (ref 0.0–14.0)
NEUT#: 3.1 10*3/uL (ref 1.5–6.5)
NEUT%: 65.9 % (ref 39.0–75.0)
Platelets: 109 10*3/uL — ABNORMAL LOW (ref 140–400)
RBC: 2.32 10*6/uL — ABNORMAL LOW (ref 4.20–5.82)
RDW: 17.9 % — AB (ref 11.0–14.6)
WBC: 4.7 10*3/uL (ref 4.0–10.3)

## 2015-06-26 LAB — COMPREHENSIVE METABOLIC PANEL
ALT: 11 U/L (ref 0–55)
AST: 14 U/L (ref 5–34)
Albumin: 3.4 g/dL — ABNORMAL LOW (ref 3.5–5.0)
Alkaline Phosphatase: 49 U/L (ref 40–150)
Anion Gap: 8 mEq/L (ref 3–11)
BUN: 21.8 mg/dL (ref 7.0–26.0)
CALCIUM: 8.9 mg/dL (ref 8.4–10.4)
CHLORIDE: 108 meq/L (ref 98–109)
CO2: 18 mEq/L — ABNORMAL LOW (ref 22–29)
CREATININE: 1.9 mg/dL — AB (ref 0.7–1.3)
EGFR: 41 mL/min/{1.73_m2} — ABNORMAL LOW (ref >90)
GLUCOSE: 120 mg/dL (ref 70–140)
Potassium: 4.5 mEq/L (ref 3.5–5.1)
SODIUM: 133 meq/L — AB (ref 136–145)
Total Bilirubin: 0.94 mg/dL (ref 0.20–1.20)
Total Protein: 8.3 g/dL (ref 6.4–8.3)

## 2015-06-26 LAB — BASIC METABOLIC PANEL
ANION GAP: 8 (ref 5–15)
BUN: 24 mg/dL — ABNORMAL HIGH (ref 6–20)
CALCIUM: 9 mg/dL (ref 8.9–10.3)
CO2: 19 mmol/L — ABNORMAL LOW (ref 22–32)
Chloride: 107 mmol/L (ref 101–111)
Creatinine, Ser: 2.01 mg/dL — ABNORMAL HIGH (ref 0.61–1.24)
GFR, EST AFRICAN AMERICAN: 38 mL/min — AB (ref >60)
GFR, EST NON AFRICAN AMERICAN: 33 mL/min — AB (ref >60)
GLUCOSE: 108 mg/dL — AB (ref 65–99)
Potassium: 4.5 mmol/L (ref 3.5–5.1)
SODIUM: 134 mmol/L — AB (ref 135–145)

## 2015-06-26 LAB — I-STAT TROPONIN, ED
TROPONIN I, POC: 0 ng/mL (ref 0.00–0.08)
Troponin i, poc: 0.01 ng/mL (ref 0.00–0.08)

## 2015-06-26 LAB — LACTATE DEHYDROGENASE: LDH: 428 U/L — AB (ref 125–245)

## 2015-06-26 MED ORDER — HYDRALAZINE HCL 50 MG PO TABS
50.0000 mg | ORAL_TABLET | Freq: Once | ORAL | Status: AC
  Administered 2015-06-26: 50 mg via ORAL
  Filled 2015-06-26: qty 1

## 2015-06-26 MED ORDER — HEPARIN SOD (PORK) LOCK FLUSH 100 UNIT/ML IV SOLN
500.0000 [IU] | Freq: Once | INTRAVENOUS | Status: AC
  Administered 2015-06-26: 500 [IU]
  Filled 2015-06-26: qty 5

## 2015-06-26 MED ORDER — TECHNETIUM TO 99M ALBUMIN AGGREGATED
4.3000 | Freq: Once | INTRAVENOUS | Status: AC | PRN
  Administered 2015-06-26: 4.3 via INTRAVENOUS

## 2015-06-26 MED ORDER — TECHNETIUM TC 99M DIETHYLENETRIAME-PENTAACETIC ACID: 33.3000 | Freq: Once | INTRAVENOUS | Status: DC | PRN

## 2015-06-26 MED ORDER — HEPARIN SOD (PORK) LOCK FLUSH 100 UNIT/ML IV SOLN
500.0000 [IU] | Freq: Once | INTRAVENOUS | Status: AC
  Administered 2015-06-26: 500 [IU] via INTRAVENOUS
  Filled 2015-06-26: qty 5

## 2015-06-26 MED ORDER — SODIUM CHLORIDE 0.9% FLUSH
10.0000 mL | INTRAVENOUS | Status: DC | PRN
  Administered 2015-06-26: 10 mL via INTRAVENOUS
  Filled 2015-06-26: qty 10

## 2015-06-26 NOTE — ED Notes (Signed)
Pt is not in room.  Will perform EKG on arrival back into room.

## 2015-06-26 NOTE — Discharge Instructions (Signed)
Nonspecific Chest Pain  °Chest pain can be caused by many different conditions. There is always a chance that your pain could be related to something serious, such as a heart attack or a blood clot in your lungs. Chest pain can also be caused by conditions that are not life-threatening. If you have chest pain, it is very important to follow up with your health care provider. °CAUSES  °Chest pain can be caused by: °· Heartburn. °· Pneumonia or bronchitis. °· Anxiety or stress. °· Inflammation around your heart (pericarditis) or lung (pleuritis or pleurisy). °· A blood clot in your lung. °· A collapsed lung (pneumothorax). It can develop suddenly on its own (spontaneous pneumothorax) or from trauma to the chest. °· Shingles infection (varicella-zoster virus). °· Heart attack. °· Damage to the bones, muscles, and cartilage that make up your chest wall. This can include: °¨ Bruised bones due to injury. °¨ Strained muscles or cartilage due to frequent or repeated coughing or overwork. °¨ Fracture to one or more ribs. °¨ Sore cartilage due to inflammation (costochondritis). °RISK FACTORS  °Risk factors for chest pain may include: °· Activities that increase your risk for trauma or injury to your chest. °· Respiratory infections or conditions that cause frequent coughing. °· Medical conditions or overeating that can cause heartburn. °· Heart disease or family history of heart disease. °· Conditions or health behaviors that increase your risk of developing a blood clot. °· Having had chicken pox (varicella zoster). °SIGNS AND SYMPTOMS °Chest pain can feel like: °· Burning or tingling on the surface of your chest or deep in your chest. °· Crushing, pressure, aching, or squeezing pain. °· Dull or sharp pain that is worse when you move, cough, or take a deep breath. °· Pain that is also felt in your back, neck, shoulder, or arm, or pain that spreads to any of these areas. °Your chest pain may come and go, or it may stay  constant. °DIAGNOSIS °Lab tests or other studies may be needed to find the cause of your pain. Your health care provider may have you take a test called an ambulatory ECG (electrocardiogram). An ECG records your heartbeat patterns at the time the test is performed. You may also have other tests, such as: °· Transthoracic echocardiogram (TTE). During echocardiography, sound waves are used to create a picture of all of the heart structures and to look at how blood flows through your heart. °· Transesophageal echocardiogram (TEE). This is a more advanced imaging test that obtains images from inside your body. It allows your health care provider to see your heart in finer detail. °· Cardiac monitoring. This allows your health care provider to monitor your heart rate and rhythm in real time. °· Holter monitor. This is a portable device that records your heartbeat and can help to diagnose abnormal heartbeats. It allows your health care provider to track your heart activity for several days, if needed. °· Stress tests. These can be done through exercise or by taking medicine that makes your heart beat more quickly. °· Blood tests. °· Imaging tests. °TREATMENT  °Your treatment depends on what is causing your chest pain. Treatment may include: °· Medicines. These may include: °¨ Acid blockers for heartburn. °¨ Anti-inflammatory medicine. °¨ Pain medicine for inflammatory conditions. °¨ Antibiotic medicine, if an infection is present. °¨ Medicines to dissolve blood clots. °¨ Medicines to treat coronary artery disease. °· Supportive care for conditions that do not require medicines. This may include: °¨ Resting. °¨ Applying heat   or cold packs to injured areas. °¨ Limiting activities until pain decreases. °HOME CARE INSTRUCTIONS °· If you were prescribed an antibiotic medicine, finish it all even if you start to feel better. °· Avoid any activities that bring on chest pain. °· Do not use any tobacco products, including  cigarettes, chewing tobacco, or electronic cigarettes. If you need help quitting, ask your health care provider. °· Do not drink alcohol. °· Take medicines only as directed by your health care provider. °· Keep all follow-up visits as directed by your health care provider. This is important. This includes any further testing if your chest pain does not go away. °· If heartburn is the cause for your chest pain, you may be told to keep your head raised (elevated) while sleeping. This reduces the chance that acid will go from your stomach into your esophagus. °· Make lifestyle changes as directed by your health care provider. These may include: °¨ Getting regular exercise. Ask your health care provider to suggest some activities that are safe for you. °¨ Eating a heart-healthy diet. A registered dietitian can help you to learn healthy eating options. °¨ Maintaining a healthy weight. °¨ Managing diabetes, if necessary. °¨ Reducing stress. °SEEK MEDICAL CARE IF: °· Your chest pain does not go away after treatment. °· You have a rash with blisters on your chest. °· You have a fever. °SEEK IMMEDIATE MEDICAL CARE IF:  °· Your chest pain is worse. °· You have an increasing cough, or you cough up blood. °· You have severe abdominal pain. °· You have severe weakness. °· You faint. °· You have chills. °· You have sudden, unexplained chest discomfort. °· You have sudden, unexplained discomfort in your arms, back, neck, or jaw. °· You have shortness of breath at any time. °· You suddenly start to sweat, or your skin gets clammy. °· You feel nauseous or you vomit. °· You suddenly feel light-headed or dizzy. °· Your heart begins to beat quickly, or it feels like it is skipping beats. °These symptoms may represent a serious problem that is an emergency. Do not wait to see if the symptoms will go away. Get medical help right away. Call your local emergency services (911 in the [place] Do not drive yourself to the hospital. °  °This  information is not intended to replace advice given to you by your health care provider. Make sure you discuss any questions you have with your health care provider. °  °Document Released: 01/28/2005 Document Revised: 05/11/2014 Document Reviewed: 11/24/2013 °Elsevier Interactive Patient Education ©2016 [employer] ° °

## 2015-06-26 NOTE — ED Notes (Signed)
MD at bedside. 

## 2015-06-26 NOTE — ED Notes (Signed)
He remains in no distress and has just been taken to [facility] For V-Q scan.

## 2015-06-26 NOTE — Patient Instructions (Signed)

## 2015-06-26 NOTE — ED Notes (Signed)
Pt at cancer center getting labs drawn.  Started having chest pain.  Pt has myeloma.  Chemo x 2 week.

## 2015-06-26 NOTE — ED Provider Notes (Signed)
CSN: [id number]     Arrival date & time 06/26/15  1138 History   First MD Initiated Contact with Patient 06/26/15 1640     Chief Complaint  Patient presents with  . Chest Pain     Patient is a 70 y.o. male presenting with chest pain. The history is provided by the patient. No language interpreter was used.  Chest Pain  Wesley [name] is a 70 y.o. male who presents to the Emergency Department complaining of chest pain.  He has a history of multiple myeloma, chronic renal insufficiency. He presents for evaluation of sharp left sided chest pain that started about 10 AM today. The pain began after a routine blood draw. He has associated right-sided abdominal discomfort, shortness of breath, diaphoresis at times. Overall his symptoms have resolved. He does not have any significant chest pain or shortness of breath at this time but he does have an ongoing cough for the last 2-3 weeks. He denies a lower extremity edema or pain. Symptoms are moderate, waxing and waning, improving.  Past Medical History  Diagnosis Date  . Pulmonary nodule, right     W/  CHEST WALL MASS--  SCHEDULED FOR BIOPSY W/ DR [name] ON FRIDAY 10-27-2013  . History of seizures as a child     AGE 89 OR 8--- NONE ISSUE SINCE  . History of GI bleed     SECONDARY TO ULCER --  DEC 2011  RESOLVED PER LAST EGD 07-05-2010  . Bladder tumor   . Hematuria   . Radiation 12/07/13-12/27/13    right anterior chest 37.5 gray  . Fracture of radius 09/29/14    left  . Cancer ([place])     multiple myeloma, bladder cancer 2015  . Encounter for antineoplastic chemotherapy 05/12/2015   Past Surgical History  Procedure Laterality Date  . Excision benign left axilla tumor  1993  . Esophagogastroduodenoscopy  X5   LAST ONE 07-05-2010  . Transurethral resection of bladder tumor with gyrus (turbt-gyrus) N/A 10/25/2013    Procedure: TRANSURETHRAL RESECTION OF BLADDER TUMOR WITH GYRUS (TURBT-GYRUS);  Surgeon: Sharyn Creamer, MD;  Location: [facility];  Service: Urology;  Laterality: N/A;  . Cystoscopy with biopsy N/A 10/25/2013    Procedure: CYSTOSCOPY;  Surgeon: Sharyn Creamer, MD;  Location: [facility];  Service: Urology;  Laterality: N/A;  . Bone biopsy Right 11/14/13     right seventh rib lesion  . Orif radial fracture Left 10/22/2014    Procedure: Intramedullary nailing left radius fracture;  Surgeon: Milly Jakob, MD;  Location: [place];  Service: Orthopedics;  Laterality: Left;  OPEN TREATMENT LEFT RADIUS FRACTURE  . Cystoscopy with retrograde pyelogram, ureteroscopy and stent placement N/A 02/21/2015    Procedure: CYSTOSCOPY, EXAM UNDER ANESTHESIA, FOLEY CATHETER PLACEMENT;  Surgeon: Festus Aloe, MD;  Location: [facility];  Service: Urology;  Laterality: N/A;   Family History  Problem Relation Age of Onset  . Heart disease Mother   . Cancer - Prostate Brother    Social History  Substance Use Topics  . Smoking status: Former Smoker -- 0.10 packs/day for 1 years    Types: Cigarettes    Quit date: 05/04/1969  . Smokeless tobacco: Never Used  . Alcohol Use: No    Review of Systems  Cardiovascular: Positive for chest pain.  All other systems reviewed and are negative.     Allergies  Review of patient's allergies indicates no known allergies.  Home Medications   Prior  to Admission medications   Medication Sig Start Date End Date Taking? Authorizing Provider  acetaminophen (TYLENOL) 325 MG tablet Take 2 tablets (650 mg total) by mouth every 4 (four) hours as needed for mild pain, fever or headache. 03/25/15  Yes Robbie Lis, MD  amLODipine (NORVASC) 5 MG tablet Take 1 tablet (5 mg total) by mouth daily. 06/10/15  Yes Marletta Lor, MD  dexamethasone (DECADRON) 4 MG tablet 10 tablet by mouth every week starting with the first dose of chemotherapy. 04/02/15  Yes [name], MD  finasteride (PROSCAR) 5 MG tablet Take 1 tablet (5 mg total) by mouth daily.  02/23/15  Yes Theodis Blaze, MD  hydrALAZINE (APRESOLINE) 50 MG tablet Take 1 tablet (50 mg total) by mouth every 8 (eight) hours. 05/08/15  Yes Marletta Lor, MD  lidocaine-prilocaine (EMLA) cream Apply 1 application topically as needed. 06/05/15  Yes [name], MD  prochlorperazine (COMPAZINE) 10 MG tablet Take 1 tablet (10 mg total) by mouth every 6 (six) hours as needed for nausea or vomiting. 04/08/15  Yes [name], MD  tamsulosin (FLOMAX) 0.4 MG CAPS capsule Take 1 capsule (0.4 mg total) by mouth daily. Patient taking differently: Take 0.4 mg by mouth 2 (two) times daily.  02/23/15  Yes Theodis Blaze, MD  HYDROcodone-acetaminophen (NORCO/VICODIN) 5-325 MG tablet Take 1-2 tablets by mouth every 4 (four) hours as needed for moderate pain. Patient not taking: Reported on 06/26/2015 02/23/15   Theodis Blaze, MD   BP 146/90 mmHg  Pulse 98  Temp(Src) 97.9 F (36.6 C) (Oral)  Resp 16  SpO2 99% Physical Exam  Constitutional: He is oriented to person, place, and time. He appears well-developed and well-nourished.  HENT:  Head: Normocephalic and atraumatic.  Cardiovascular: Normal rate and regular rhythm.   No murmur heard. Pulmonary/Chest: Effort normal. No respiratory distress.  Occasional rhonchi bilaterally. No chest wall tenderness.  Abdominal: Soft. There is no tenderness. There is no rebound and no guarding.  Musculoskeletal: He exhibits no edema or tenderness.  Neurological: He is alert and oriented to person, place, and time.  Skin: Skin is warm and dry.  Psychiatric: He has a normal mood and affect. His behavior is normal.  Nursing note and vitals reviewed.   ED Course  Procedures (including critical care time) Labs Review Labs Reviewed  BASIC METABOLIC PANEL - Abnormal; Notable for the following:    Sodium 134 (*)    CO2 19 (*)    Glucose, Bld 108 (*)    BUN 24 (*)    Creatinine, Ser 2.01 (*)    GFR calc non Af Amer 33 (*)    GFR calc Af Amer 38 (*)     All other components within normal limits  CBC - Abnormal; Notable for the following:    RBC 2.46 (*)    Hemoglobin 8.0 (*)    HCT 23.8 (*)    RDW 17.7 (*)    Platelets 123 (*)    All other components within normal limits  I-STAT TROPOININ, ED  Randolm Idol, ED    Imaging Review Dg Chest 2 View  06/26/2015  CLINICAL DATA:  Shortness of breath and cough.  Sharp chest pain. EXAM: CHEST  2 VIEW COMPARISON:  03/20/2015 and CT scan of the chest dated 11/23/2014 FINDINGS: There are new small bilateral pleural effusions. The nodule at the left lung base posteriorly has increased in size and measures 13 mm in diameter. This was approximately 9 mm in  size on the prior CT scan. There is suggestion of a new 13 mm nodule at the right lung base laterally adjacent to the mass destroying the lateral aspect of the right seventh rib. That mass appears essentially unchanged. No new bone lesions. There is slight cardiomegaly. Pulmonary vascularity is normal. Power port appears in good position. IMPRESSION: 1. New small bilateral pleural effusions. 2. Slight increase in size of the pulmonary nodule at the left lung base posteriorly. 3. Possible new nodule at the right lung base laterally. 4. No significant change in the mass destroying the lateral aspect of the right seventh rib. Electronically Signed   By: Lorriane Shire M.D.   On: 06/26/2015 12:51   Nm Pulmonary Perf And Vent  06/26/2015  CLINICAL DATA:  Sudden onset chest pain this afternoon with shortness of breath. EXAM: NUCLEAR MEDICINE VENTILATION - PERFUSION LUNG SCAN TECHNIQUE: Ventilation images were obtained in multiple projections using inhaled aerosol Tc-14mDTPA. Perfusion images were obtained in multiple projections after intravenous injection of Tc-924mAA. RADIOPHARMACEUTICALS:  33.3 Technetium-9920mPA aerosol inhalation and 4.3 Technetium-30m87m IV COMPARISON:  Chest x-ray from today. FINDINGS: Ventilation: No focal ventilation defect.  Perfusion: No wedge shaped peripheral perfusion defects to suggest acute pulmonary embolism. IMPRESSION: Normal bilateral lung perfusion. Electronically Signed   By: EricMisty Stanley.   On: 06/26/2015 19:57   I have personally reviewed and evaluated these images and lab results as part of my medical decision-making. ED ECG REPORT   Date: 06/27/2015  Rate: 99  Rhythm: normal sinus rhythm  QRS Axis: normal  Intervals: normal  ST/T Wave abnormalities: normal  Conduction Disutrbances:none  Narrative Interpretation:   Old EKG Reviewed: none available  I have personally reviewed the EKG tracing and agree with the computerized printout as noted.   MDM   Final diagnoses:  Chest pain   Patient here for evaluation of sharp and stabbing left-sided chest pain, currently undergoing treatment for multiple myeloma.  Labs demonstrate renal insufficiency that is similar to baseline. CBC with anemia that is also similar compared to baseline. VQ scan obtained to rule out PE, VQ scan is low probability. He shouldn't is pain-free on repeat evaluation. Plan to DC home with close outpatient follow-up with oncology. Chest x-ray does demonstrate pulmonary nodules, discussed the importance of following up with the oncologist for further evaluation.     ElizQuintella Reichert 06/27/15 [phone]

## 2015-06-27 ENCOUNTER — Ambulatory Visit (INDEPENDENT_AMBULATORY_CARE_PROVIDER_SITE_OTHER): Payer: Medicare HMO | Admitting: Internal Medicine

## 2015-06-27 ENCOUNTER — Encounter: Payer: Self-pay | Admitting: Internal Medicine

## 2015-06-27 ENCOUNTER — Ambulatory Visit: Payer: Medicare HMO | Admitting: Internal Medicine

## 2015-06-27 VITALS — BP 146/90 | HR 107 | Temp 98.3°F | Resp 20 | Ht 68.0 in | Wt 192.0 lb

## 2015-06-27 DIAGNOSIS — I10 Essential (primary) hypertension: Secondary | ICD-10-CM

## 2015-06-27 DIAGNOSIS — C9000 Multiple myeloma not having achieved remission: Secondary | ICD-10-CM

## 2015-06-27 DIAGNOSIS — K4030 Unilateral inguinal hernia, with obstruction, without gangrene, not specified as recurrent: Secondary | ICD-10-CM

## 2015-06-27 LAB — IGG, IGA, IGM
IGM (IMMUNOGLOBIN M), SRM: 9 mg/dL — AB (ref 20–172)
IgA, Qn, Serum: <5 mg/dL — ABNORMAL LOW (ref 61–437)

## 2015-06-27 LAB — BETA 2 MICROGLOBULIN, SERUM: BETA 2: 6.2 mg/L — AB (ref 0.6–2.4)

## 2015-06-27 LAB — KAPPA/LAMBDA LIGHT CHAINS
IG KAPPA FREE LIGHT CHAIN: 892.45 mg/L — AB (ref 3.30–19.40)
Ig Lambda Free Light Chain: 6.01 mg/L (ref 5.71–26.30)
Kappa/Lambda FluidC Ratio: 148.49 — ABNORMAL HIGH (ref 0.26–1.65)

## 2015-06-27 NOTE — Progress Notes (Signed)
Pre visit review using our clinic review tool, if applicable. No additional management support is needed unless otherwise documented below in the visit note. 

## 2015-06-27 NOTE — Progress Notes (Signed)
Subjective:    Patient ID: Wesley [name], male    DOB: 1946-02-12, 70 y.o.   MRN: [id number]  HPI  70 year old patient who has essential hypertension.  He is followed by oncology with a history of myeloma.  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  He presents with a 2-4 week history of a bulge in the right groin area.  He describes a vague discomfort but no significant pain He was seen in the ED yesterday with some left-sided sharp chest pain.  This has not reoccurred.  Evaluation included a VQ scan that was low risk  Past Medical History  Diagnosis Date  . Pulmonary nodule, right     W/  CHEST WALL MASS--  SCHEDULED FOR BIOPSY W/ DR [name] ON FRIDAY 10-27-2013  . History of seizures as a child     AGE 30 OR 8--- NONE ISSUE SINCE  . History of GI bleed     SECONDARY TO ULCER --  DEC 2011  RESOLVED PER LAST EGD 07-05-2010  . Bladder tumor   . Hematuria   . Radiation 12/07/13-12/27/13    right anterior chest 37.5 gray  . Fracture of radius 09/29/14    left  . Cancer ([place])     multiple myeloma, bladder cancer 2015  . Encounter for antineoplastic chemotherapy 05/12/2015    Social History   Social History  . Marital Status: Married    Spouse Name: N/A  . Number of Children: 2  . Years of Education: N/A   Occupational History  . retired    Social History Main Topics  . Smoking status: Former Smoker -- 0.10 packs/day for 1 years    Types: Cigarettes    Quit date: 05/04/1969  . Smokeless tobacco: Never Used  . Alcohol Use: No  . Drug Use: No  . Sexual Activity: Not on file   Other Topics Concern  . Not on file   Social History Narrative    Past Surgical History  Procedure Laterality Date  . Excision benign left axilla tumor  1993  . Esophagogastroduodenoscopy  X5   LAST ONE 07-05-2010  . Transurethral resection of bladder tumor with gyrus (turbt-gyrus) N/A 10/25/2013    Procedure: TRANSURETHRAL RESECTION OF BLADDER TUMOR WITH GYRUS  (TURBT-GYRUS);  Surgeon: Sharyn Creamer, MD;  Location: [facility];  Service: Urology;  Laterality: N/A;  . Cystoscopy with biopsy N/A 10/25/2013    Procedure: CYSTOSCOPY;  Surgeon: Sharyn Creamer, MD;  Location[facility];  Service: Urology;  Laterality: N/A;  . Bone biopsy Right 11/14/13     right seventh rib lesion  . Orif radial fracture Left 10/22/2014    Procedure: Intramedullary nailing left radius fracture;  Surgeon: Milly Jakob, MD;  Location: [place];  Service: Orthopedics;  Laterality: Left;  OPEN TREATMENT LEFT RADIUS FRACTURE  . Cystoscopy with retrograde pyelogram, ureteroscopy and stent placement N/A 02/21/2015    Procedure: CYSTOSCOPY, EXAM UNDER ANESTHESIA, FOLEY CATHETER PLACEMENT;  Surgeon: Festus Aloe, MD;  Location: [facility];  Service: Urology;  Laterality: N/A;    Family History  Problem Relation Age of Onset  . Heart disease Mother   . Cancer - Prostate Brother     No Known Allergies  Current Outpatient Prescriptions on File Prior to Visit  Medication Sig Dispense Refill  . acetaminophen (TYLENOL) 325 MG tablet Take 2 tablets (650 mg total) by mouth every 4 (four) hours as needed for mild pain, fever  or headache. 30 tablet 0  . amLODipine (NORVASC) 5 MG tablet Take 1 tablet (5 mg total) by mouth daily. 90 tablet 3  . dexamethasone (DECADRON) 4 MG tablet 10 tablet by mouth every week starting with the first dose of chemotherapy. 80 tablet 2  . finasteride (PROSCAR) 5 MG tablet Take 1 tablet (5 mg total) by mouth daily. 30 tablet 1  . hydrALAZINE (APRESOLINE) 50 MG tablet Take 1 tablet (50 mg total) by mouth every 8 (eight) hours. 90 tablet 1  . HYDROcodone-acetaminophen (NORCO/VICODIN) 5-325 MG tablet Take 1-2 tablets by mouth every 4 (four) hours as needed for moderate pain. 65 tablet 0  . lidocaine-prilocaine (EMLA) cream Apply 1 application topically as needed. 30 g 0  . prochlorperazine (COMPAZINE) 10 MG  tablet Take 1 tablet (10 mg total) by mouth every 6 (six) hours as needed for nausea or vomiting. 30 tablet 0  . tamsulosin (FLOMAX) 0.4 MG CAPS capsule Take 1 capsule (0.4 mg total) by mouth daily. (Patient taking differently: Take 0.4 mg by mouth 2 (two) times daily. ) 30 capsule 1   No current facility-administered medications on file prior to visit.    BP 146/90 mmHg  Pulse 107  Temp(Src) 98.3 F (36.8 C) (Oral)  Resp 20  Ht '5\' 8"'$  (1.727 m)  Wt 192 lb (87.091 kg)  BMI 29.20 kg/m2  SpO2 98%       Review of Systems  Constitutional: Negative for fever, chills, appetite change and fatigue.  HENT: Negative for congestion, dental problem, ear pain, hearing loss, sore throat, tinnitus, trouble swallowing and voice change.   Eyes: Negative for pain, discharge and visual disturbance.  Respiratory: Negative for cough, chest tightness, wheezing and stridor.   Cardiovascular: Positive for chest pain. Negative for palpitations and leg swelling.  Gastrointestinal: Negative for nausea, vomiting, abdominal pain, diarrhea, constipation, blood in stool and abdominal distention.  Genitourinary: Negative for urgency, hematuria, flank pain, discharge, difficulty urinating and genital sores.  Musculoskeletal: Negative for myalgias, back pain, joint swelling, arthralgias, gait problem and neck stiffness.  Skin: Negative for rash.  Neurological: Negative for dizziness, syncope, speech difficulty, weakness, numbness and headaches.  Hematological: Negative for adenopathy. Does not bruise/bleed easily.  Psychiatric/Behavioral: Negative for behavioral problems and dysphoric mood. The patient is not nervous/anxious.        Objective:   Physical Exam  Constitutional: He appears well-developed and well-nourished. No distress.  Repeat blood pressure 130/82, pulse 90  Cardiovascular: Normal rate, regular rhythm and normal heart sounds.   No murmur heard. Pulmonary/Chest: Effort normal and breath sounds  normal.  Abdominal: Soft. Bowel sounds are normal.  Approximate 5 cm soft tissue mass in the right groin superior to the inguinal ligament.  Not easily reducible          Assessment & Plan:   Mass right groin.  This apparently is a right femoral hernia;  is scheduled to see oncology next week.  Minimally symptomatic.  Will observe at this point Hypertension, stable Light chain myeloma.  Follow-up oncology

## 2015-06-27 NOTE — Patient Instructions (Signed)

## 2015-07-01 ENCOUNTER — Telehealth: Payer: Self-pay | Admitting: Internal Medicine

## 2015-07-01 DIAGNOSIS — K4090 Unilateral inguinal hernia, without obstruction or gangrene, not specified as recurrent: Secondary | ICD-10-CM

## 2015-07-01 NOTE — Telephone Encounter (Signed)
Please see message and advise 

## 2015-07-01 NOTE — Telephone Encounter (Signed)
Left message on personal voicemail, order for referral to General Surgeon Dr.Blackwell done and someone will be contacting you for an appt. Any questions please call the office.

## 2015-07-01 NOTE — Telephone Encounter (Signed)
Ok to refer.

## 2015-07-01 NOTE — Telephone Encounter (Signed)
Pt states his hernia is hurting, Dr Raliegh Ip advised he may need a Psychologist, sport and exercise. Pt called Dr Philipp Ovens , but needs a referral. But pt states anyone Dr Raliegh Ip recommends will be ok.

## 2015-07-02 ENCOUNTER — Other Ambulatory Visit: Payer: Medicare HMO

## 2015-07-03 ENCOUNTER — Other Ambulatory Visit (HOSPITAL_BASED_OUTPATIENT_CLINIC_OR_DEPARTMENT_OTHER): Payer: Medicare HMO

## 2015-07-03 ENCOUNTER — Ambulatory Visit (HOSPITAL_BASED_OUTPATIENT_CLINIC_OR_DEPARTMENT_OTHER): Payer: Medicare HMO | Admitting: Internal Medicine

## 2015-07-03 ENCOUNTER — Other Ambulatory Visit: Payer: Self-pay | Admitting: Medical Oncology

## 2015-07-03 ENCOUNTER — Encounter: Payer: Self-pay | Admitting: Internal Medicine

## 2015-07-03 ENCOUNTER — Ambulatory Visit (HOSPITAL_BASED_OUTPATIENT_CLINIC_OR_DEPARTMENT_OTHER): Payer: Medicare HMO

## 2015-07-03 ENCOUNTER — Ambulatory Visit (HOSPITAL_COMMUNITY)
Admission: RE | Admit: 2015-07-03 | Discharge: 2015-07-03 | Disposition: A | Discharge status: Home / Self Care | Payer: Medicare HMO | Source: Ambulatory Visit | Attending: Internal Medicine | Admitting: Internal Medicine

## 2015-07-03 ENCOUNTER — Telehealth: Payer: Self-pay | Admitting: Internal Medicine

## 2015-07-03 VITALS — BP 121/69 | HR 111 | Temp 97.9°F | Resp 18 | Ht 68.0 in | Wt 189.4 lb

## 2015-07-03 VITALS — BP 111/66 | HR 99 | Temp 98.3°F | Resp 16

## 2015-07-03 DIAGNOSIS — D6481 Anemia due to antineoplastic chemotherapy: Secondary | ICD-10-CM

## 2015-07-03 DIAGNOSIS — D649 Anemia, unspecified: Secondary | ICD-10-CM | POA: Diagnosis present

## 2015-07-03 DIAGNOSIS — N2889 Other specified disorders of kidney and ureter: Secondary | ICD-10-CM

## 2015-07-03 DIAGNOSIS — T451X5A Adverse effect of antineoplastic and immunosuppressive drugs, initial encounter: Secondary | ICD-10-CM

## 2015-07-03 DIAGNOSIS — C9000 Multiple myeloma not having achieved remission: Secondary | ICD-10-CM | POA: Diagnosis not present

## 2015-07-03 DIAGNOSIS — R590 Localized enlarged lymph nodes: Secondary | ICD-10-CM | POA: Insufficient documentation

## 2015-07-03 DIAGNOSIS — R599 Enlarged lymph nodes, unspecified: Secondary | ICD-10-CM

## 2015-07-03 DIAGNOSIS — N08 Glomerular disorders in diseases classified elsewhere: Secondary | ICD-10-CM

## 2015-07-03 DIAGNOSIS — Z5111 Encounter for antineoplastic chemotherapy: Secondary | ICD-10-CM

## 2015-07-03 LAB — COMPREHENSIVE METABOLIC PANEL
ALT: 10 U/L (ref 0–55)
AST: 14 U/L (ref 5–34)
Albumin: 3.3 g/dL — ABNORMAL LOW (ref 3.5–5.0)
Alkaline Phosphatase: 44 U/L (ref 40–150)
Anion Gap: 8 mEq/L (ref 3–11)
BUN: 20.8 mg/dL (ref 7.0–26.0)
CHLORIDE: 103 meq/L (ref 98–109)
CO2: 20 meq/L — AB (ref 22–29)
Calcium: 9.1 mg/dL (ref 8.4–10.4)
Creatinine: 2.7 mg/dL — ABNORMAL HIGH (ref 0.7–1.3)
EGFR: 27 mL/min/{1.73_m2} — AB (ref >90)
GLUCOSE: 150 mg/dL — AB (ref 70–140)
POTASSIUM: 4.8 meq/L (ref 3.5–5.1)
SODIUM: 131 meq/L — AB (ref 136–145)
Total Bilirubin: 1.04 mg/dL (ref 0.20–1.20)
Total Protein: 8.9 g/dL — ABNORMAL HIGH (ref 6.4–8.3)

## 2015-07-03 LAB — CBC WITH DIFFERENTIAL/PLATELET
BASO%: 1.1 % (ref 0.0–2.0)
BASOS ABS: 0.1 10*3/uL (ref 0.0–0.1)
EOS ABS: 0.1 10*3/uL (ref 0.0–0.5)
EOS%: 2.6 % (ref 0.0–7.0)
HCT: 22.9 % — ABNORMAL LOW (ref 38.4–49.9)
HGB: 7.5 g/dL — ABNORMAL LOW (ref 13.0–17.1)
LYMPH%: 8.4 % — AB (ref 14.0–49.0)
MCH: 32.7 pg (ref 27.2–33.4)
MCHC: 32.8 g/dL (ref 32.0–36.0)
MCV: 99.9 fL — AB (ref 79.3–98.0)
MONO#: 1.2 10*3/uL — ABNORMAL HIGH (ref 0.1–0.9)
MONO%: 25.1 % — AB (ref 0.0–14.0)
NEUT%: 62.8 % (ref 39.0–75.0)
NEUTROS ABS: 3.1 10*3/uL (ref 1.5–6.5)
Platelets: 223 10*3/uL (ref 140–400)
RBC: 2.29 10*6/uL — AB (ref 4.20–5.82)
RDW: 19.2 % — ABNORMAL HIGH (ref 11.0–14.6)
WBC: 4.9 10*3/uL (ref 4.0–10.3)
lymph#: 0.4 10*3/uL — ABNORMAL LOW (ref 0.9–3.3)

## 2015-07-03 LAB — PREPARE RBC (CROSSMATCH)

## 2015-07-03 MED ORDER — POMALIDOMIDE 3 MG PO CAPS: 3.0000 mg | ORAL_CAPSULE | Freq: Every day | ORAL | Status: DC

## 2015-07-03 MED ORDER — DIPHENHYDRAMINE HCL 25 MG PO CAPS
ORAL_CAPSULE | ORAL | Status: AC
  Filled 2015-07-03: qty 1

## 2015-07-03 MED ORDER — ACETAMINOPHEN 325 MG PO TABS
650.0000 mg | ORAL_TABLET | Freq: Once | ORAL | Status: AC
  Administered 2015-07-03: 650 mg via ORAL

## 2015-07-03 MED ORDER — SODIUM CHLORIDE 0.9 % IV SOLN
250.0000 mL | Freq: Once | INTRAVENOUS | Status: AC
  Administered 2015-07-03: 250 mL via INTRAVENOUS

## 2015-07-03 MED ORDER — ACETAMINOPHEN 325 MG PO TABS
ORAL_TABLET | ORAL | Status: AC
  Filled 2015-07-03: qty 2

## 2015-07-03 MED ORDER — DIPHENHYDRAMINE HCL 25 MG PO CAPS
25.0000 mg | ORAL_CAPSULE | Freq: Once | ORAL | Status: AC
  Administered 2015-07-03: 25 mg via ORAL

## 2015-07-03 MED ORDER — HEPARIN SOD (PORK) LOCK FLUSH 100 UNIT/ML IV SOLN
500.0000 [IU] | Freq: Every day | INTRAVENOUS | Status: AC | PRN
  Administered 2015-07-03: 500 [IU]
  Filled 2015-07-03: qty 5

## 2015-07-03 MED ORDER — SODIUM CHLORIDE 0.9% FLUSH
10.0000 mL | INTRAVENOUS | Status: AC | PRN
  Administered 2015-07-03: 10 mL
  Filled 2015-07-03: qty 10

## 2015-07-03 NOTE — Progress Notes (Signed)
[place] [phone]   [phone]  OFFICE PROGRESS NOTE  [name], MD [place] [address]  DIAGNOSIS:  Multiple myeloma diagnosed in May 2016  PRIOR THERAPY:  1) Curative radiotherapy to the tumor mass at the right anterior chest under the care of Dr. [name] completed in 12/27/2013. 2) Systemic chemotherapy with weekly subcutaneous Velcade 1.3 MG/M2, Revlimid 25 mg by mouth daily for 21 days every 4 weeks in addition to weekly Decadron 40 mg orally. Last dose of Velcade was giving 03/08/2015. This treatment was discontinued secondary to disease progression. 3) Carfilzomib 20 MG/M2 initially first week followed by 36 MG/M2 on days 1, 2, 8, 9, 15 and 16 every 4 weeks in addition to Cytoxan 300 MG/M2 weekly as well as Decadron 40 mg on a weekly basis. First dose scheduled on 04/08/2015. Status post 3 cycles. This was discontinued 07/03/2015 secondary to disease progression   CURRENT THERAPY: Carfilzomib 20 MG/M2 initially first week followed by 36 MG/M2 on days 1, 2, 8, 9, 15 and 16 every 4 weeks in addition to Pomalyst 3 mg by mouth daily for 21 days every 3 weeks as well as Decadron 40 mg on a weekly basis. First dose scheduled on March 2017.   INTERVAL HISTORY: Wesley [name] 70 y.o. male returns to the clinic today for follow-up visit accompanied by his wife. The patient is feeling fine today with no specific complaints except for fatigue and questionable lump on the right side of the sternum started after severe cough. He has been tolerating his treatment fairly well with no significant adverse effect except for the fatigue. The patient denied having any significant weakness. He denied having any chest pain, shortness of breath, cough or hemoptysis. He has no nausea or vomiting. She had repeat myeloma panel performed recently and he is here for evaluation and discussion of his lab results and treatment  options.  MEDICAL HISTORY: Past Medical History  Diagnosis Date  . Pulmonary nodule, right     W/  CHEST WALL MASS--  SCHEDULED FOR BIOPSY W/ DR [name] ON FRIDAY 10-27-2013  . History of seizures as a child     AGE 88 OR 8--- NONE ISSUE SINCE  . History of GI bleed     SECONDARY TO ULCER --  DEC 2011  RESOLVED PER LAST EGD 07-05-2010  . Bladder tumor   . Hematuria   . Radiation 12/07/13-12/27/13    right anterior chest 37.5 gray  . Fracture of radius 09/29/14    left  . Cancer ([place])     multiple myeloma, bladder cancer 2015  . Encounter for antineoplastic chemotherapy 05/12/2015    ALLERGIES:  has No Known Allergies.  MEDICATIONS:  Current Outpatient Prescriptions  Medication Sig Dispense Refill  . acetaminophen (TYLENOL) 325 MG tablet Take 2 tablets (650 mg total) by mouth every 4 (four) hours as needed for mild pain, fever or headache. 30 tablet 0  . amLODipine (NORVASC) 5 MG tablet Take 1 tablet (5 mg total) by mouth daily. 90 tablet 3  . dexamethasone (DECADRON) 4 MG tablet 10 tablet by mouth every week starting with the first dose of chemotherapy. 80 tablet 2  . finasteride (PROSCAR) 5 MG tablet Take 1 tablet (5 mg total) by mouth daily. 30 tablet 1  . hydrALAZINE (APRESOLINE) 50 MG tablet Take 1 tablet (50 mg total) by mouth every 8 (eight) hours. 90 tablet 1  . HYDROcodone-acetaminophen (NORCO/VICODIN) 5-325 MG tablet  Take 1-2 tablets by mouth every 4 (four) hours as needed for moderate pain. 65 tablet 0  . lidocaine-prilocaine (EMLA) cream Apply 1 application topically as needed. 30 g 0  . prochlorperazine (COMPAZINE) 10 MG tablet Take 1 tablet (10 mg total) by mouth every 6 (six) hours as needed for nausea or vomiting. 30 tablet 0  . tamsulosin (FLOMAX) 0.4 MG CAPS capsule Take 1 capsule (0.4 mg total) by mouth daily. (Patient taking differently: Take 0.4 mg by mouth 2 (two) times daily. ) 30 capsule 1   No current facility-administered medications for this visit.     SURGICAL HISTORY:  Past Surgical History  Procedure Laterality Date  . Excision benign left axilla tumor  1993  . Esophagogastroduodenoscopy  X5   LAST ONE 07-05-2010  . Transurethral resection of bladder tumor with gyrus (turbt-gyrus) N/A 10/25/2013    Procedure: TRANSURETHRAL RESECTION OF BLADDER TUMOR WITH GYRUS (TURBT-GYRUS);  Surgeon: Sharyn Creamer, MD;  Location: [facility];  Service: Urology;  Laterality: N/A;  . Cystoscopy with biopsy N/A 10/25/2013    Procedure: CYSTOSCOPY;  Surgeon: Sharyn Creamer, MD;  Location: [facility];  Service: Urology;  Laterality: N/A;  . Bone biopsy Right 11/14/13     right seventh rib lesion  . Orif radial fracture Left 10/22/2014    Procedure: Intramedullary nailing left radius fracture;  Surgeon: Milly Jakob, MD;  Location: [facility];  Service: Orthopedics;  Laterality: Left;  OPEN TREATMENT LEFT RADIUS FRACTURE  . Cystoscopy with retrograde pyelogram, ureteroscopy and stent placement N/A 02/21/2015    Procedure: CYSTOSCOPY, EXAM UNDER ANESTHESIA, FOLEY CATHETER PLACEMENT;  Surgeon: Festus Aloe, MD;  Location: [facility];  Service: Urology;  Laterality: N/A;    REVIEW OF SYSTEMS:  Constitutional: positive for fatigue Eyes: negative Ears, nose, mouth, throat, and face: negative Respiratory: positive for pleurisy/chest pain Cardiovascular: negative Gastrointestinal: negative Genitourinary:negative Integument/breast: negative Hematologic/lymphatic: negative Musculoskeletal:negative Neurological: negative Behavioral/Psych: negative Endocrine: negative Allergic/Immunologic: negative   PHYSICAL EXAMINATION: General appearance: alert, cooperative and no distress Head: Normocephalic, without obvious abnormality, atraumatic Neck: no adenopathy, no carotid bruit, supple, symmetrical, trachea midline and thyroid not enlarged, symmetric, no tenderness/mass/nodules Lymph nodes: Cervical,  supraclavicular, and axillary nodes normal. Resp: clear to auscultation bilaterally Back: symmetric, no curvature. ROM normal. No CVA tenderness. Cardio: regular rate and rhythm, S1, S2 normal, no murmur, click, rub or gallop GI: soft, non-tender; bowel sounds normal; no masses,  no organomegaly Extremities: extremities normal, atraumatic, no cyanosis or edema Neurologic: Alert and oriented X 3, normal strength and tone. Normal symmetric reflexes. Normal coordination and gait  ECOG PERFORMANCE STATUS: 1 - Symptomatic but completely ambulatory  Blood pressure 121/69, pulse 111, temperature 97.9 F (36.6 C), temperature source Oral, resp. rate 18, height '5\' 8"'$  (1.727 m), weight 189 lb 6.4 oz (85.911 kg), SpO2 100 %.  LABORATORY DATA: Lab Results  Component Value Date   WBC 4.9 07/03/2015   HGB 7.5* 07/03/2015   HCT 22.9* 07/03/2015   MCV 99.9* 07/03/2015   PLT 223 07/03/2015      Chemistry      Component Value Date/Time   NA 134* 06/26/2015 1250   NA 133* 06/26/2015 1007   K 4.5 06/26/2015 1250   K 4.5 06/26/2015 1007   CL 107 06/26/2015 1250   CO2 19* 06/26/2015 1250   CO2 18* 06/26/2015 1007   BUN 24* 06/26/2015 1250   BUN 21.8 06/26/2015 1007   CREATININE 2.01* 06/26/2015 1250   CREATININE  1.9* 06/26/2015 1007      Component Value Date/Time   CALCIUM 9.0 06/26/2015 1250   CALCIUM 8.9 06/26/2015 1007   ALKPHOS 49 06/26/2015 1007   ALKPHOS 65 03/20/2015 1640   AST 14 06/26/2015 1007   AST 32 03/20/2015 1640   ALT 11 06/26/2015 1007   ALT 26 03/20/2015 1640   BILITOT 0.94 06/26/2015 1007   BILITOT 1.0 03/20/2015 1640       RADIOGRAPHIC STUDIES: Dg Chest 2 View  06/26/2015  CLINICAL DATA:  Shortness of breath and cough.  Sharp chest pain. EXAM: CHEST  2 VIEW COMPARISON:  03/20/2015 and CT scan of the chest dated 11/23/2014 FINDINGS: There are new small bilateral pleural effusions. The nodule at the left lung base posteriorly has increased in size and measures 13 mm  in diameter. This was approximately 9 mm in size on the prior CT scan. There is suggestion of a new 13 mm nodule at the right lung base laterally adjacent to the mass destroying the lateral aspect of the right seventh rib. That mass appears essentially unchanged. No new bone lesions. There is slight cardiomegaly. Pulmonary vascularity is normal. Power port appears in good position. IMPRESSION: 1. New small bilateral pleural effusions. 2. Slight increase in size of the pulmonary nodule at the left lung base posteriorly. 3. Possible new nodule at the right lung base laterally. 4. No significant change in the mass destroying the lateral aspect of the right seventh rib. Electronically Signed   By: Lorriane Shire M.D.   On: 06/26/2015 12:51   Nm Pulmonary Perf And Vent  06/26/2015  CLINICAL DATA:  Sudden onset chest pain this afternoon with shortness of breath. EXAM: NUCLEAR MEDICINE VENTILATION - PERFUSION LUNG SCAN TECHNIQUE: Ventilation images were obtained in multiple projections using inhaled aerosol Tc-50mDTPA. Perfusion images were obtained in multiple projections after intravenous injection of Tc-9103mAA. RADIOPHARMACEUTICALS:  33.3 Technetium-9943mPA aerosol inhalation and 4.3 Technetium-81m93m IV COMPARISON:  Chest x-ray from today. FINDINGS: Ventilation: No focal ventilation defect. Perfusion: No wedge shaped peripheral perfusion defects to suggest acute pulmonary embolism. IMPRESSION: Normal bilateral lung perfusion. Electronically Signed   By: EricMisty Stanley.   On: 06/26/2015 19:57   Ir Fluoro Guide Cv Line Right  06/10/2015  CLINICAL DATA:  Multiple myeloma and need for porta cath for continued therapy. EXAM: IMPLANTED PORT A CATH PLACEMENT WITH ULTRASOUND AND FLUOROSCOPIC GUIDANCE ANESTHESIA/SEDATION: 3.0 mg IV Versed; 50 mcg IV Fentanyl Total Moderate Sedation Time:  37 minutes The patient's level of consciousness and physiologic status were continuously monitored during the procedure by  Radiology nursing. Additional Medications: 2 g IV Ancef. As antibiotic prophylaxis, Ancef was ordered pre-procedure and administered intravenously within one hour of incision. FLUOROSCOPY TIME:  30 seconds. PROCEDURE: The procedure, risks, benefits, and alternatives were explained to the patient. Questions regarding the procedure were encouraged and answered. The patient understands and consents to the procedure. A time-out was performed prior to the procedure. The right neck and chest were prepped with chlorhexidine in a sterile fashion, and a sterile drape was applied covering the operative field. Maximum barrier sterile technique with sterile gowns and gloves were used for the procedure. Local anesthesia was provided with 1% lidocaine. Ultrasound was used to confirm patency of the right internal jugular vein After creating a small venotomy incision, a 21 gauge needle was advanced into the right internal jugular vein under direct, real-time ultrasound guidance. Ultrasound image documentation was performed. After securing guidewire access, an 8 Fr  dilator was placed. A J-wire was kinked to measure appropriate catheter length. A subcutaneous port pocket was then created along the upper chest wall utilizing sharp and blunt dissection. Portable cautery was utilized. The pocket was irrigated with sterile saline. A single lumen power injectable port was chosen for placement. The 8 Fr catheter was tunneled from the port pocket site to the venotomy incision. The port was placed in the pocket. External catheter was trimmed to appropriate length based on guidewire measurement. At the venotomy, an 8 Fr peel-away sheath was placed over a guidewire. The catheter was then placed through the sheath and the sheath removed. Final catheter positioning was confirmed and documented with a fluoroscopic spot image. The port was accessed with a needle and aspirated and flushed with heparinized saline. The needle was removed. The  venotomy and port pocket incisions were closed with subcutaneous 3-0 Monocryl and subcuticular 4-0 Vicryl. Dermabond was applied to both incisions. COMPLICATIONS: None FINDINGS: After catheter placement, the tip lies at the cavoatrial junction. The catheter aspirates normally and is ready for immediate use. IMPRESSION: Placement of single lumen port a cath via right internal jugular vein. The catheter tip lies at the cavoatrial junction. A power injectable port a cath was placed and is ready for immediate use. Electronically Signed   By: Aletta Edouard M.D.   On: 06/10/2015 10:37   Ir US Guide Vasc Access Right  06/10/2015  CLINICAL DATA:  Multiple myeloma and need for porta cath for continued therapy. EXAM: IMPLANTED PORT A CATH PLACEMENT WITH ULTRASOUND AND FLUOROSCOPIC GUIDANCE ANESTHESIA/SEDATION: 3.0 mg IV Versed; 50 mcg IV Fentanyl Total Moderate Sedation Time:  37 minutes The patient's level of consciousness and physiologic status were continuously monitored during the procedure by Radiology nursing. Additional Medications: 2 g IV Ancef. As antibiotic prophylaxis, Ancef was ordered pre-procedure and administered intravenously within one hour of incision. FLUOROSCOPY TIME:  30 seconds. PROCEDURE: The procedure, risks, benefits, and alternatives were explained to the patient. Questions regarding the procedure were encouraged and answered. The patient understands and consents to the procedure. A time-out was performed prior to the procedure. The right neck and chest were prepped with chlorhexidine in a sterile fashion, and a sterile drape was applied covering the operative field. Maximum barrier sterile technique with sterile gowns and gloves were used for the procedure. Local anesthesia was provided with 1% lidocaine. Ultrasound was used to confirm patency of the right internal jugular vein After creating a small venotomy incision, a 21 gauge needle was advanced into the right internal jugular vein under  direct, real-time ultrasound guidance. Ultrasound image documentation was performed. After securing guidewire access, an 8 Fr dilator was placed. A J-wire was kinked to measure appropriate catheter length. A subcutaneous port pocket was then created along the upper chest wall utilizing sharp and blunt dissection. Portable cautery was utilized. The pocket was irrigated with sterile saline. A single lumen power injectable port was chosen for placement. The 8 Fr catheter was tunneled from the port pocket site to the venotomy incision. The port was placed in the pocket. External catheter was trimmed to appropriate length based on guidewire measurement. At the venotomy, an 8 Fr peel-away sheath was placed over a guidewire. The catheter was then placed through the sheath and the sheath removed. Final catheter positioning was confirmed and documented with a fluoroscopic spot image. The port was accessed with a needle and aspirated and flushed with heparinized saline. The needle was removed. The venotomy and port pocket incisions were  closed with subcutaneous 3-0 Monocryl and subcuticular 4-0 Vicryl. Dermabond was applied to both incisions. COMPLICATIONS: None FINDINGS: After catheter placement, the tip lies at the cavoatrial junction. The catheter aspirates normally and is ready for immediate use. IMPRESSION: Placement of single lumen port a cath via right internal jugular vein. The catheter tip lies at the cavoatrial junction. A power injectable port a cath was placed and is ready for immediate use. Electronically Signed   By: Aletta Edouard M.D.   On: 06/10/2015 10:37    ASSESSMENT AND PLAN: This is a very pleasant 70 years old African-American male with recently diagnosed multiple myeloma.  I had a lengthy discussion with the patient and his wife about his current condition and treatment options. The patient was started on treatment with subcutaneous Velcade, Revlimid and Decadron for around 4 months but this was  discontinued secondary to disease progression with increase in the retroperitoneal mass in addition to new lytic bone lesions. 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 I recommended for the patient to discontinue his current treatment with Velcade, Revlimid and Decadron. He was started on treatment with Carfilzomib, Cytoxan and Decadron status post 3 cycles and tolerated his treatment well except for fatigue.  Unfortunately the recent myeloma panel showed evidence for disease progression. I recommended for the patient to discontinue his current treatment regimen. I discussed with him treatment with Carfilzomib, Pomalyst and Decadron. He is expected to start the first cycle of this treatment next week. For the chemotherapy-induced anemia, I will arrange for the patient to receive 2 units of PRBCs transfusion. For the palpable left supraclavicular lymphadenopathy as well as the right chest wall lump, I will arrange for the patient to have CT scan of the chest without contrast for further evaluation of these lesions. He would come back for follow-up visit in 2 weeks for reevaluation and management of any adverse effect of his treatment. The patient and his wife agreed to the current plan. He was advised to call if he has any concerning symptoms in the interval. The patient voices understanding of current disease status and treatment options and is in agreement with the current care plan.  All questions were answered. The patient knows to call the clinic with any problems, questions or concerns. We can certainly see the patient much sooner if necessary.  Disclaimer: This note was dictated with voice recognition software. Similar sounding words can inadvertently be transcribed and may not be corrected upon review.

## 2015-07-03 NOTE — Telephone Encounter (Signed)
apppt made per 3/1 pof. Pt will get printed copy of schedule in treatment room

## 2015-07-03 NOTE — Patient Instructions (Signed)

## 2015-07-04 ENCOUNTER — Ambulatory Visit: Payer: Medicare HMO

## 2015-07-04 ENCOUNTER — Encounter: Payer: Self-pay | Admitting: Internal Medicine

## 2015-07-04 LAB — TYPE AND SCREEN
ABO/RH(D): B POS
Antibody Screen: NEGATIVE
UNIT DIVISION: 0
Unit division: 0

## 2015-07-04 NOTE — Progress Notes (Signed)
Faxed notes/labs to [employer] for prior auth for pomalyst

## 2015-07-05 ENCOUNTER — Ambulatory Visit (HOSPITAL_COMMUNITY)
Admission: RE | Admit: 2015-07-05 | Discharge: 2015-07-05 | Disposition: A | Discharge status: Home / Self Care | Payer: Medicare HMO | Source: Ambulatory Visit | Attending: Internal Medicine | Admitting: Internal Medicine

## 2015-07-05 ENCOUNTER — Encounter: Payer: Self-pay | Admitting: Internal Medicine

## 2015-07-05 DIAGNOSIS — R590 Localized enlarged lymph nodes: Secondary | ICD-10-CM

## 2015-07-05 DIAGNOSIS — C9000 Multiple myeloma not having achieved remission: Secondary | ICD-10-CM

## 2015-07-05 DIAGNOSIS — T451X5A Adverse effect of antineoplastic and immunosuppressive drugs, initial encounter: Secondary | ICD-10-CM

## 2015-07-05 DIAGNOSIS — Z5111 Encounter for antineoplastic chemotherapy: Secondary | ICD-10-CM

## 2015-07-05 DIAGNOSIS — D6481 Anemia due to antineoplastic chemotherapy: Secondary | ICD-10-CM

## 2015-07-05 DIAGNOSIS — N08 Glomerular disorders in diseases classified elsewhere: Secondary | ICD-10-CM

## 2015-07-05 NOTE — Progress Notes (Signed)
Per [employer] 05/03/15-05/03/16 pomalyst ref# [id number]

## 2015-07-07 ENCOUNTER — Emergency Department (HOSPITAL_COMMUNITY): Payer: Medicare HMO

## 2015-07-07 ENCOUNTER — Inpatient Hospital Stay (HOSPITAL_COMMUNITY): Payer: Medicare HMO

## 2015-07-07 ENCOUNTER — Inpatient Hospital Stay (HOSPITAL_COMMUNITY)
Admission: EM | Admit: 2015-07-07 | Discharge: 2015-07-18 | DRG: 840 | Disposition: A | Discharge status: Hospice | Payer: Medicare HMO | Attending: Internal Medicine | Admitting: Internal Medicine

## 2015-07-07 ENCOUNTER — Other Ambulatory Visit: Payer: Self-pay

## 2015-07-07 ENCOUNTER — Encounter (HOSPITAL_COMMUNITY): Payer: Self-pay | Admitting: *Deleted

## 2015-07-07 DIAGNOSIS — J9601 Acute respiratory failure with hypoxia: Secondary | ICD-10-CM | POA: Diagnosis present

## 2015-07-07 DIAGNOSIS — Z87891 Personal history of nicotine dependence: Secondary | ICD-10-CM | POA: Diagnosis not present

## 2015-07-07 DIAGNOSIS — Z9889 Other specified postprocedural states: Secondary | ICD-10-CM | POA: Diagnosis not present

## 2015-07-07 DIAGNOSIS — C801 Malignant (primary) neoplasm, unspecified: Secondary | ICD-10-CM | POA: Diagnosis not present

## 2015-07-07 DIAGNOSIS — G893 Neoplasm related pain (acute) (chronic): Secondary | ICD-10-CM | POA: Insufficient documentation

## 2015-07-07 DIAGNOSIS — E869 Volume depletion, unspecified: Secondary | ICD-10-CM | POA: Diagnosis not present

## 2015-07-07 DIAGNOSIS — Z923 Personal history of irradiation: Secondary | ICD-10-CM | POA: Diagnosis not present

## 2015-07-07 DIAGNOSIS — C9000 Multiple myeloma not having achieved remission: Principal | ICD-10-CM | POA: Insufficient documentation

## 2015-07-07 DIAGNOSIS — J90 Pleural effusion, not elsewhere classified: Secondary | ICD-10-CM | POA: Diagnosis present

## 2015-07-07 DIAGNOSIS — Z510 Encounter for antineoplastic radiation therapy: Secondary | ICD-10-CM | POA: Diagnosis present

## 2015-07-07 DIAGNOSIS — I10 Essential (primary) hypertension: Secondary | ICD-10-CM

## 2015-07-07 DIAGNOSIS — R599 Enlarged lymph nodes, unspecified: Secondary | ICD-10-CM | POA: Diagnosis not present

## 2015-07-07 DIAGNOSIS — D6481 Anemia due to antineoplastic chemotherapy: Secondary | ICD-10-CM | POA: Diagnosis present

## 2015-07-07 DIAGNOSIS — T451X5A Adverse effect of antineoplastic and immunosuppressive drugs, initial encounter: Secondary | ICD-10-CM | POA: Diagnosis present

## 2015-07-07 DIAGNOSIS — Z515 Encounter for palliative care: Secondary | ICD-10-CM | POA: Insufficient documentation

## 2015-07-07 DIAGNOSIS — I129 Hypertensive chronic kidney disease with stage 1 through stage 4 chronic kidney disease, or unspecified chronic kidney disease: Secondary | ICD-10-CM | POA: Diagnosis present

## 2015-07-07 DIAGNOSIS — T502X5A Adverse effect of carbonic-anhydrase inhibitors, benzothiadiazides and other diuretics, initial encounter: Secondary | ICD-10-CM | POA: Diagnosis present

## 2015-07-07 DIAGNOSIS — K5900 Constipation, unspecified: Secondary | ICD-10-CM | POA: Insufficient documentation

## 2015-07-07 DIAGNOSIS — Z8042 Family history of malignant neoplasm of prostate: Secondary | ICD-10-CM | POA: Diagnosis not present

## 2015-07-07 DIAGNOSIS — Z7952 Long term (current) use of systemic steroids: Secondary | ICD-10-CM | POA: Diagnosis not present

## 2015-07-07 DIAGNOSIS — Z7189 Other specified counseling: Secondary | ICD-10-CM | POA: Insufficient documentation

## 2015-07-07 DIAGNOSIS — M899 Disorder of bone, unspecified: Secondary | ICD-10-CM | POA: Diagnosis not present

## 2015-07-07 DIAGNOSIS — Z8551 Personal history of malignant neoplasm of bladder: Secondary | ICD-10-CM

## 2015-07-07 DIAGNOSIS — E43 Unspecified severe protein-calorie malnutrition: Secondary | ICD-10-CM | POA: Diagnosis present

## 2015-07-07 DIAGNOSIS — C7989 Secondary malignant neoplasm of other specified sites: Secondary | ICD-10-CM | POA: Diagnosis present

## 2015-07-07 DIAGNOSIS — N1330 Unspecified hydronephrosis: Secondary | ICD-10-CM | POA: Diagnosis present

## 2015-07-07 DIAGNOSIS — K5641 Fecal impaction: Secondary | ICD-10-CM | POA: Diagnosis not present

## 2015-07-07 DIAGNOSIS — Z79899 Other long term (current) drug therapy: Secondary | ICD-10-CM

## 2015-07-07 DIAGNOSIS — Z8249 Family history of ischemic heart disease and other diseases of the circulatory system: Secondary | ICD-10-CM | POA: Diagnosis not present

## 2015-07-07 DIAGNOSIS — R0600 Dyspnea, unspecified: Secondary | ICD-10-CM

## 2015-07-07 DIAGNOSIS — E878 Other disorders of electrolyte and fluid balance, not elsewhere classified: Secondary | ICD-10-CM | POA: Diagnosis present

## 2015-07-07 DIAGNOSIS — Z66 Do not resuscitate: Secondary | ICD-10-CM | POA: Diagnosis present

## 2015-07-07 DIAGNOSIS — N183 Chronic kidney disease, stage 3 unspecified: Secondary | ICD-10-CM

## 2015-07-07 DIAGNOSIS — E875 Hyperkalemia: Secondary | ICD-10-CM | POA: Diagnosis present

## 2015-07-07 DIAGNOSIS — E790 Hyperuricemia without signs of inflammatory arthritis and tophaceous disease: Secondary | ICD-10-CM | POA: Diagnosis present

## 2015-07-07 DIAGNOSIS — E8770 Fluid overload, unspecified: Secondary | ICD-10-CM | POA: Diagnosis not present

## 2015-07-07 DIAGNOSIS — N179 Acute kidney failure, unspecified: Secondary | ICD-10-CM | POA: Diagnosis present

## 2015-07-07 DIAGNOSIS — D630 Anemia in neoplastic disease: Secondary | ICD-10-CM | POA: Diagnosis present

## 2015-07-07 DIAGNOSIS — E871 Hypo-osmolality and hyponatremia: Secondary | ICD-10-CM | POA: Insufficient documentation

## 2015-07-07 DIAGNOSIS — C799 Secondary malignant neoplasm of unspecified site: Secondary | ICD-10-CM | POA: Diagnosis present

## 2015-07-07 DIAGNOSIS — R000 Tachycardia, unspecified: Secondary | ICD-10-CM | POA: Diagnosis present

## 2015-07-07 DIAGNOSIS — J910 Malignant pleural effusion: Secondary | ICD-10-CM

## 2015-07-07 LAB — COMPREHENSIVE METABOLIC PANEL
ALK PHOS: 40 U/L (ref 38–126)
ALT: 13 U/L — ABNORMAL LOW (ref 17–63)
ANION GAP: 9 (ref 5–15)
AST: 20 U/L (ref 15–41)
Albumin: 3.7 g/dL (ref 3.5–5.0)
BILIRUBIN TOTAL: 1 mg/dL (ref 0.3–1.2)
BUN: 17 mg/dL (ref 6–20)
CALCIUM: 9.1 mg/dL (ref 8.9–10.3)
CO2: 19 mmol/L — ABNORMAL LOW (ref 22–32)
Chloride: 98 mmol/L — ABNORMAL LOW (ref 101–111)
Creatinine, Ser: 2.52 mg/dL — ABNORMAL HIGH (ref 0.61–1.24)
GFR, EST AFRICAN AMERICAN: 29 mL/min — AB (ref >60)
GFR, EST NON AFRICAN AMERICAN: 25 mL/min — AB (ref >60)
Glucose, Bld: 111 mg/dL — ABNORMAL HIGH (ref 65–99)
Potassium: 5.2 mmol/L — ABNORMAL HIGH (ref 3.5–5.1)
SODIUM: 126 mmol/L — AB (ref 135–145)
TOTAL PROTEIN: 9.1 g/dL — AB (ref 6.5–8.1)

## 2015-07-07 LAB — CBC
HCT: 28.1 % — ABNORMAL LOW (ref 39.0–52.0)
Hemoglobin: 9.4 g/dL — ABNORMAL LOW (ref 13.0–17.0)
MCH: 31.9 pg (ref 26.0–34.0)
MCHC: 33.5 g/dL (ref 30.0–36.0)
MCV: 95.3 fL (ref 78.0–100.0)
PLATELETS: 196 10*3/uL (ref 150–400)
RBC: 2.95 MIL/uL — AB (ref 4.22–5.81)
RDW: 16.9 % — AB (ref 11.5–15.5)
WBC: 3.3 10*3/uL — AB (ref 4.0–10.5)

## 2015-07-07 LAB — BASIC METABOLIC PANEL
Anion gap: 11 (ref 5–15)
BUN: 17 mg/dL (ref 6–20)
CALCIUM: 8.9 mg/dL (ref 8.9–10.3)
CO2: 17 mmol/L — AB (ref 22–32)
CREATININE: 2.39 mg/dL — AB (ref 0.61–1.24)
Chloride: 95 mmol/L — ABNORMAL LOW (ref 101–111)
GFR, EST AFRICAN AMERICAN: 31 mL/min — AB (ref >60)
GFR, EST NON AFRICAN AMERICAN: 26 mL/min — AB (ref >60)
Glucose, Bld: 134 mg/dL — ABNORMAL HIGH (ref 65–99)
Potassium: 5.5 mmol/L — ABNORMAL HIGH (ref 3.5–5.1)
Sodium: 123 mmol/L — ABNORMAL LOW (ref 135–145)

## 2015-07-07 LAB — CBC WITH DIFFERENTIAL/PLATELET
BASOS ABS: 0 10*3/uL (ref 0.0–0.1)
Basophils Relative: 1 %
EOS PCT: 1 %
Eosinophils Absolute: 0.1 10*3/uL (ref 0.0–0.7)
HEMATOCRIT: 27.9 % — AB (ref 39.0–52.0)
HEMOGLOBIN: 9.2 g/dL — AB (ref 13.0–17.0)
LYMPHS PCT: 21 %
Lymphs Abs: 0.8 10*3/uL (ref 0.7–4.0)
MCH: 31.4 pg (ref 26.0–34.0)
MCHC: 33 g/dL (ref 30.0–36.0)
MCV: 95.2 fL (ref 78.0–100.0)
MONO ABS: 0.5 10*3/uL (ref 0.1–1.0)
Monocytes Relative: 13 %
Neutro Abs: 2.6 10*3/uL (ref 1.7–7.7)
Neutrophils Relative %: 64 %
Platelets: 234 10*3/uL (ref 150–400)
RBC: 2.93 MIL/uL — ABNORMAL LOW (ref 4.22–5.81)
RDW: 17.3 % — ABNORMAL HIGH (ref 11.5–15.5)
WBC: 4 10*3/uL (ref 4.0–10.5)

## 2015-07-07 LAB — MRSA PCR SCREENING: MRSA BY PCR: NEGATIVE

## 2015-07-07 LAB — I-STAT CG4 LACTIC ACID, ED: LACTIC ACID, VENOUS: 1.73 mmol/L (ref 0.5–2.0)

## 2015-07-07 MED ORDER — ONDANSETRON HCL 4 MG/2ML IJ SOLN: 4.0000 mg | Freq: Four times a day (QID) | INTRAMUSCULAR | Status: DC | PRN

## 2015-07-07 MED ORDER — DEXTROSE 5 % IV SOLN: 1.0000 g | INTRAVENOUS | Status: DC

## 2015-07-07 MED ORDER — FINASTERIDE 5 MG PO TABS
5.0000 mg | ORAL_TABLET | Freq: Every day | ORAL | Status: DC
  Administered 2015-07-07 – 2015-07-18 (×12): 5 mg via ORAL
  Filled 2015-07-07 (×12): qty 1

## 2015-07-07 MED ORDER — ALBUTEROL SULFATE (2.5 MG/3ML) 0.083% IN NEBU: 2.5000 mg | INHALATION_SOLUTION | RESPIRATORY_TRACT | Status: DC | PRN

## 2015-07-07 MED ORDER — DEXTROSE 5 % IV SOLN: 500.0000 mg | INTRAVENOUS | Status: DC

## 2015-07-07 MED ORDER — FUROSEMIDE 10 MG/ML IJ SOLN
40.0000 mg | Freq: Once | INTRAMUSCULAR | Status: AC
  Administered 2015-07-07: 40 mg via INTRAVENOUS
  Filled 2015-07-07: qty 4

## 2015-07-07 MED ORDER — AMLODIPINE BESYLATE 5 MG PO TABS: 5.0000 mg | ORAL_TABLET | Freq: Every day | ORAL | Status: DC

## 2015-07-07 MED ORDER — DEXTROSE 5 % IV SOLN
1.0000 g | Freq: Once | INTRAVENOUS | Status: AC
  Administered 2015-07-07: 1 g via INTRAVENOUS
  Filled 2015-07-07: qty 10

## 2015-07-07 MED ORDER — METHYLPREDNISOLONE SODIUM SUCC 125 MG IJ SOLR: 60.0000 mg | Freq: Two times a day (BID) | INTRAMUSCULAR | Status: DC

## 2015-07-07 MED ORDER — ONDANSETRON HCL 4 MG PO TABS
4.0000 mg | ORAL_TABLET | Freq: Four times a day (QID) | ORAL | Status: DC | PRN
  Administered 2015-07-10: 4 mg via ORAL
  Filled 2015-07-07: qty 1

## 2015-07-07 MED ORDER — PROCHLORPERAZINE MALEATE 10 MG PO TABS: 10.0000 mg | ORAL_TABLET | Freq: Four times a day (QID) | ORAL | Status: DC | PRN

## 2015-07-07 MED ORDER — METHYLPREDNISOLONE SODIUM SUCC 125 MG IJ SOLR
INTRAMUSCULAR | Status: AC
  Filled 2015-07-07: qty 2

## 2015-07-07 MED ORDER — FUROSEMIDE 10 MG/ML IJ SOLN
120.0000 mg | Freq: Two times a day (BID) | INTRAVENOUS | Status: DC
  Administered 2015-07-07 – 2015-07-09 (×5): 120 mg via INTRAVENOUS
  Filled 2015-07-07 (×3): qty 2
  Filled 2015-07-07: qty 12
  Filled 2015-07-07: qty 2

## 2015-07-07 MED ORDER — HYDROCODONE-ACETAMINOPHEN 5-325 MG PO TABS
1.0000 | ORAL_TABLET | ORAL | Status: DC | PRN
  Administered 2015-07-10: 2 via ORAL
  Administered 2015-07-10: 1 via ORAL
  Administered 2015-07-10: 2 via ORAL
  Administered 2015-07-11 – 2015-07-12 (×2): 1 via ORAL
  Filled 2015-07-07: qty 2
  Filled 2015-07-07: qty 1
  Filled 2015-07-07: qty 2
  Filled 2015-07-07 (×2): qty 1

## 2015-07-07 MED ORDER — HYDRALAZINE HCL 50 MG PO TABS
50.0000 mg | ORAL_TABLET | Freq: Three times a day (TID) | ORAL | Status: DC
  Administered 2015-07-07 – 2015-07-09 (×7): 50 mg via ORAL
  Filled 2015-07-07 (×7): qty 1

## 2015-07-07 MED ORDER — SODIUM CHLORIDE 0.9 % IV SOLN: INTRAVENOUS | Status: DC

## 2015-07-07 MED ORDER — HYDRALAZINE HCL 20 MG/ML IJ SOLN: 5.0000 mg | INTRAMUSCULAR | Status: DC | PRN

## 2015-07-07 MED ORDER — ENOXAPARIN SODIUM 30 MG/0.3ML ~~LOC~~ SOLN
30.0000 mg | SUBCUTANEOUS | Status: DC
  Administered 2015-07-07: 30 mg via SUBCUTANEOUS
  Filled 2015-07-07: qty 0.3

## 2015-07-07 MED ORDER — DEXAMETHASONE SODIUM PHOSPHATE 4 MG/ML IJ SOLN
4.0000 mg | Freq: Four times a day (QID) | INTRAMUSCULAR | Status: DC
  Administered 2015-07-07 – 2015-07-11 (×15): 4 mg via INTRAVENOUS
  Filled 2015-07-07 (×16): qty 1

## 2015-07-07 MED ORDER — DEXTROSE 5 % IV SOLN
500.0000 mg | INTRAVENOUS | Status: DC
  Administered 2015-07-08 – 2015-07-10 (×3): 500 mg via INTRAVENOUS
  Filled 2015-07-07 (×3): qty 500

## 2015-07-07 MED ORDER — IPRATROPIUM-ALBUTEROL 0.5-2.5 (3) MG/3ML IN SOLN
3.0000 mL | Freq: Three times a day (TID) | RESPIRATORY_TRACT | Status: DC
  Administered 2015-07-08 – 2015-07-09 (×5): 3 mL via RESPIRATORY_TRACT
  Filled 2015-07-07 (×6): qty 3

## 2015-07-07 MED ORDER — DEXTROSE 5 % IV SOLN
500.0000 mg | Freq: Once | INTRAVENOUS | Status: AC
  Administered 2015-07-07: 500 mg via INTRAVENOUS
  Filled 2015-07-07: qty 500

## 2015-07-07 MED ORDER — SODIUM CHLORIDE 0.9% FLUSH
3.0000 mL | Freq: Two times a day (BID) | INTRAVENOUS | Status: DC
  Administered 2015-07-07 – 2015-07-18 (×13): 3 mL via INTRAVENOUS

## 2015-07-07 MED ORDER — POLYETHYLENE GLYCOL 3350 17 G PO PACK: 17.0000 g | PACK | Freq: Every day | ORAL | Status: DC | PRN

## 2015-07-07 MED ORDER — IPRATROPIUM-ALBUTEROL 0.5-2.5 (3) MG/3ML IN SOLN
3.0000 mL | Freq: Four times a day (QID) | RESPIRATORY_TRACT | Status: DC
  Administered 2015-07-07 (×2): 3 mL via RESPIRATORY_TRACT
  Filled 2015-07-07 (×2): qty 3

## 2015-07-07 MED ORDER — CEFTRIAXONE SODIUM 1 G IJ SOLR
1.0000 g | INTRAMUSCULAR | Status: DC
  Administered 2015-07-08 – 2015-07-10 (×3): 1 g via INTRAVENOUS
  Filled 2015-07-07 (×3): qty 10

## 2015-07-07 MED ORDER — TAMSULOSIN HCL 0.4 MG PO CAPS
0.4000 mg | ORAL_CAPSULE | Freq: Two times a day (BID) | ORAL | Status: DC
  Administered 2015-07-07 – 2015-07-18 (×23): 0.4 mg via ORAL
  Filled 2015-07-07 (×23): qty 1

## 2015-07-07 MED ORDER — IOHEXOL 300 MG/ML  SOLN
50.0000 mL | Freq: Once | INTRAMUSCULAR | Status: AC | PRN
  Administered 2015-07-07: 50 mL via ORAL

## 2015-07-07 MED ORDER — METHYLPREDNISOLONE SODIUM SUCC 125 MG IJ SOLR
125.0000 mg | Freq: Once | INTRAMUSCULAR | Status: AC
  Administered 2015-07-07: 125 mg via INTRAVENOUS
  Filled 2015-07-07: qty 2

## 2015-07-07 MED ORDER — IPRATROPIUM-ALBUTEROL 0.5-2.5 (3) MG/3ML IN SOLN: 3.0000 mL | RESPIRATORY_TRACT | Status: DC | PRN

## 2015-07-07 NOTE — ED Notes (Signed)
Patient is alert and oriented x4.  He is a cancer patient.  Today he is complaining of shortness of breath that  Started 2 weeks ago.  Patient also is complaining of right groin pain in the location of a hernia.  He denies any Enlargement of the hernia but states that the pain has increase over the last two weeks.

## 2015-07-07 NOTE — Progress Notes (Signed)
  Echocardiogram 2D Echocardiogram has been performed.  Darlina Sicilian M 07/07/2015, 4:22 PM

## 2015-07-07 NOTE — ED Provider Notes (Signed)
CSN: [id number]     Arrival date & time 07/07/15  0821 History   First MD Initiated Contact with Patient 07/07/15 [phone]     Chief Complaint  Patient presents with  . Shortness of Breath      HPI  Patient  presents for evaluation of shortness of breath, and a "hernia" with pain in his right groin worsening for the last several weeks.  Past medical history including multiple myeloma. Diagnosis in 2016. Followed by Dr. [name] at [facility] oncology. Status post radiation therapy to right lateral chest wall and seventh rib tumor bulk completed a 2615. Systemic chemotherapy with Velcade/Revlimid/Decadron DC'd 11/26 2/2 disease progression. Systemic chemotherapy with Carfilzomib/Cytoxan/Decadron DC'd 3/1 2/2 progression. Systemic chemotherapy with /Pomalyst/DecadronCarfilzomib Initiated 3/1. (4 days ago)  Patient has had difficulty breathing for the last few weeks. Underwent 2 unit transfusion of red blood cells this week.  Shortness of breath is persisted. Dyspnea has worsened. He describes pain and swelling in his left neck. He was seen by Dr. [name] for this this week. Outpatient CT the chest has been requested. He has told his physicians he has a "hernia" in his right groin. He states this become larger and more painful. On exam today, this would appear to be a firm mass in the right groin.  Most recent CT of the abdomen was a 11/16. Which did not show right inguinal mass. Did show: 1. New right lower lobe pulmonary nodule. 2. 5 cm left retroperitoneal mass at the level of the left renal vein, previously 2.6 cm.  He states he feels short of breath at rest at home. Has not had fevers or chills. Has no chest pain.     Past Medical History  Diagnosis Date  . Pulmonary nodule, right     W/  CHEST WALL MASS--  SCHEDULED FOR BIOPSY W/ DR [name] ON FRIDAY 10-27-2013  . History of seizures as a child     AGE 6 OR 8--- NONE ISSUE SINCE  . History of GI bleed     SECONDARY TO ULCER --  DEC  2011  RESOLVED PER LAST EGD 07-05-2010  . Bladder tumor   . Hematuria   . Radiation 12/07/13-12/27/13    right anterior chest 37.5 gray  . Fracture of radius 09/29/14    left  . Cancer ([place])     multiple myeloma, bladder cancer 2015  . Encounter for antineoplastic chemotherapy 05/12/2015   Past Surgical History  Procedure Laterality Date  . Excision benign left axilla tumor  1993  . Esophagogastroduodenoscopy  X5   LAST ONE 07-05-2010  . Transurethral resection of bladder tumor with gyrus (turbt-gyrus) N/A 10/25/2013    Procedure: TRANSURETHRAL RESECTION OF BLADDER TUMOR WITH GYRUS (TURBT-GYRUS);  Surgeon: Sharyn Creamer, MD;  Location: [facility];  Service: Urology;  Laterality: N/A;  . Cystoscopy with biopsy N/A 10/25/2013    Procedure: CYSTOSCOPY;  Surgeon: Sharyn Creamer, MD;  Location: [facility];  Service: Urology;  Laterality: N/A;  . Bone biopsy Right 11/14/13     right seventh rib lesion  . Orif radial fracture Left 10/22/2014    Procedure: Intramedullary nailing left radius fracture;  Surgeon: Milly Jakob, MD;  Location: [place];  Service: Orthopedics;  Laterality: Left;  OPEN TREATMENT LEFT RADIUS FRACTURE  . Cystoscopy with retrograde pyelogram, ureteroscopy and stent placement N/A 02/21/2015    Procedure: CYSTOSCOPY, EXAM UNDER ANESTHESIA, FOLEY CATHETER PLACEMENT;  Surgeon: Festus Aloe, MD;  Location: [facility];  Service: Urology;  Laterality: N/A;   Family History  Problem Relation Age of Onset  . Heart disease Mother   . Cancer - Prostate Brother    Social History  Substance Use Topics  . Smoking status: Former Smoker -- 0.10 packs/day for 1 years    Types: Cigarettes    Quit date: 05/04/1969  . Smokeless tobacco: Never Used  . Alcohol Use: No    Review of Systems  Constitutional: Negative for fever, chills, diaphoresis, appetite change and fatigue.  HENT: Negative for mouth sores, sore throat and trouble  swallowing.   Eyes: Negative for visual disturbance.  Respiratory: Positive for cough, shortness of breath and wheezing. Negative for chest tightness.   Cardiovascular: Negative for chest pain.  Gastrointestinal: Negative for nausea, vomiting, abdominal pain, diarrhea and abdominal distention.       Firm tender and non-mobile mass right in the region. 2 separate areas distinguishable mass  Endocrine: Negative for polydipsia, polyphagia and polyuria.  Genitourinary: Negative for dysuria, frequency and hematuria.  Musculoskeletal: Negative for gait problem.  Skin: Negative for color change, pallor and rash.  Neurological: Negative for dizziness, syncope, light-headedness and headaches.  Hematological: Does not bruise/bleed easily.  Psychiatric/Behavioral: Negative for behavioral problems and confusion.      Allergies  Review of patient's allergies indicates no known allergies.  Home Medications   Prior to Admission medications   Medication Sig Start Date End Date Taking? Authorizing Provider  acetaminophen (TYLENOL) 325 MG tablet Take 2 tablets (650 mg total) by mouth every 4 (four) hours as needed for mild pain, fever or headache. 03/25/15  Yes Robbie Lis, MD  amLODipine (NORVASC) 5 MG tablet Take 1 tablet (5 mg total) by mouth daily. 06/10/15  Yes Marletta Lor, MD  dexamethasone (DECADRON) 4 MG tablet 10 tablet by mouth every week starting with the first dose of chemotherapy. Patient taking differently: Take 40 mg by mouth once a week. starting with the first dose of chemotherapy. 04/02/15  Yes [name], MD  finasteride (PROSCAR) 5 MG tablet Take 1 tablet (5 mg total) by mouth daily. 02/23/15  Yes Theodis Blaze, MD  hydrALAZINE (APRESOLINE) 50 MG tablet Take 1 tablet (50 mg total) by mouth every 8 (eight) hours. 05/08/15  Yes Marletta Lor, MD  HYDROcodone-acetaminophen (NORCO/VICODIN) 5-325 MG tablet Take 1-2 tablets by mouth every 4 (four) hours as needed for  moderate pain. 02/23/15  Yes Theodis Blaze, MD  lactose free nutrition (BOOST) LIQD Take 237 mLs by mouth 2 (two) times daily.   Yes Historical Provider, MD  lidocaine-prilocaine (EMLA) cream Apply 1 application topically as needed. Patient taking differently: Apply 1 application topically as needed (for port).  06/05/15  Yes [name], MD  [place]   Yes Historical Provider, MD  prochlorperazine (COMPAZINE) 10 MG tablet Take 1 tablet (10 mg total) by mouth every 6 (six) hours as needed for nausea or vomiting. 04/08/15  Yes [name], MD  tamsulosin (FLOMAX) 0.4 MG CAPS capsule Take 1 capsule (0.4 mg total) by mouth daily. Patient taking differently: Take 0.4 mg by mouth 2 (two) times daily.  02/23/15  Yes Theodis Blaze, MD  pomalidomide (POMALYST) 3 MG capsule Take 1 capsule (3 mg total) by mouth daily. Take with water on days 1-21. Repeat every 28 days. Patient not taking: Reported on 07/07/2015 07/03/15   [name], MD   BP 139/96 mmHg  Pulse 117  Temp(Src) 97.6 F (36.4 C) (  Oral)  Resp 18  Ht '5\' 8"'$  (1.727 m)  Wt 189 lb 4.8 oz (85.866 kg)  BMI 28.79 kg/m2  SpO2 97% Physical Exam  Constitutional: He is oriented to person, place, and time. He appears well-developed and well-nourished. No distress.  Elderly African-American male. Dyspneic at rest.  HENT:  Head: Normocephalic.  Eyes: Conjunctivae are normal. Pupils are equal, round, and reactive to light. No scleral icterus.  Neck: Normal range of motion. Neck supple. JVD present. No thyromegaly present.    Cardiovascular: Normal rate and regular rhythm.  Exam reveals no gallop and no friction rub.   No murmur heard. Heart tones not distant.  Pulmonary/Chest: Effort normal and breath sounds normal. No respiratory distress. He has no wheezes. He has no rales.  Diminished bibasilar breath sounds, right greater than left. Wheezing, prolongation in all fields.  Abdominal: Soft. Bowel sounds are  normal. He exhibits no distension. There is no tenderness. There is no rebound.    Firm tender non mobile mass right groin  Musculoskeletal: Normal range of motion.  Neurological: He is alert and oriented to person, place, and time.  Skin: Skin is warm and dry. No rash noted.  Psychiatric: He has a normal mood and affect. His behavior is normal.    ED Course  Procedures (including critical care time) Labs Review Labs Reviewed  CBC WITH DIFFERENTIAL/PLATELET - Abnormal; Notable for the following:    RBC 2.93 (*)    Hemoglobin 9.2 (*)    HCT 27.9 (*)    RDW 17.3 (*)    All other components within normal limits  COMPREHENSIVE METABOLIC PANEL - Abnormal; Notable for the following:    Sodium 126 (*)    Potassium 5.2 (*)    Chloride 98 (*)    CO2 19 (*)    Glucose, Bld 111 (*)    Creatinine, Ser 2.52 (*)    Total Protein 9.1 (*)    ALT 13 (*)    GFR calc non Af Amer 25 (*)    GFR calc Af Amer 29 (*)    All other components within normal limits  CBC - Abnormal; Notable for the following:    WBC 3.3 (*)    RBC 2.95 (*)    Hemoglobin 9.4 (*)    HCT 28.1 (*)    RDW 16.9 (*)    All other components within normal limits  BASIC METABOLIC PANEL - Abnormal; Notable for the following:    Sodium 123 (*)    Potassium 5.5 (*)    Chloride 95 (*)    CO2 17 (*)    Glucose, Bld 134 (*)    Creatinine, Ser 2.39 (*)    GFR calc non Af Amer 26 (*)    GFR calc Af Amer 31 (*)    All other components within normal limits  BASIC METABOLIC PANEL - Abnormal; Notable for the following:    Sodium 122 (*)    Potassium 5.4 (*)    Chloride 94 (*)    CO2 19 (*)    Glucose, Bld 130 (*)    BUN 23 (*)    Creatinine, Ser 2.81 (*)    GFR calc non Af Amer 22 (*)    GFR calc Af Amer 25 (*)    All other components within normal limits  CBC - Abnormal; Notable for the following:    RBC 2.88 (*)    Hemoglobin 8.9 (*)    HCT 26.9 (*)    RDW 16.9 (*)  All other components within normal limits    PROTIME-INR - Abnormal; Notable for the following:    Prothrombin Time 16.1 (*)    All other components within normal limits  LACTATE DEHYDROGENASE, BODY FLUID - Abnormal; Notable for the following:    LD, Fluid 465 (*)    All other components within normal limits  BODY FLUID CELL COUNT WITH DIFFERENTIAL - Abnormal; Notable for the following:    Color, Fluid RED (*)    Appearance, Fluid CLOUDY (*)    WBC, Fluid 5830 (*)    Monocyte-Macrophage-Serous Fluid 30 (*)    All other components within normal limits  OSMOLALITY, URINE - Abnormal; Notable for the following:    Osmolality, Ur 216 (*)    All other components within normal limits  OSMOLALITY - Abnormal; Notable for the following:    Osmolality 268 (*)    All other components within normal limits  URIC ACID - Abnormal; Notable for the following:    Uric Acid, Serum 12.1 (*)    All other components within normal limits  COMPREHENSIVE METABOLIC PANEL - Abnormal; Notable for the following:    Sodium 126 (*)    Chloride 94 (*)    Glucose, Bld 130 (*)    BUN 34 (*)    Creatinine, Ser 2.79 (*)    Total Protein 9.0 (*)    ALT 14 (*)    Alkaline Phosphatase 36 (*)    GFR calc non Af Amer 22 (*)    GFR calc Af Amer 25 (*)    All other components within normal limits  CBC - Abnormal; Notable for the following:    RBC 2.78 (*)    Hemoglobin 8.7 (*)    HCT 26.0 (*)    RDW 16.9 (*)    All other components within normal limits  BASIC METABOLIC PANEL - Abnormal; Notable for the following:    Sodium 125 (*)    Chloride 91 (*)    Glucose, Bld 114 (*)    BUN 43 (*)    Creatinine, Ser 2.82 (*)    Calcium 8.7 (*)    GFR calc non Af Amer 22 (*)    GFR calc Af Amer 25 (*)    All other components within normal limits  CBC - Abnormal; Notable for the following:    RBC 2.77 (*)    Hemoglobin 8.7 (*)    HCT 26.0 (*)    RDW 16.4 (*)    All other components within normal limits  BASIC METABOLIC PANEL - Abnormal; Notable for the  following:    Sodium 121 (*)    Potassium 5.3 (*)    Chloride 88 (*)    Glucose, Bld 119 (*)    BUN 66 (*)    Creatinine, Ser 3.00 (*)    GFR calc non Af Amer 20 (*)    GFR calc Af Amer 23 (*)    All other components within normal limits  BASIC METABOLIC PANEL - Abnormal; Notable for the following:    Sodium 127 (*)    Chloride 90 (*)    Glucose, Bld 118 (*)    BUN 71 (*)    Creatinine, Ser 2.67 (*)    GFR calc non Af Amer 23 (*)    GFR calc Af Amer 27 (*)    All other components within normal limits  CBC - Abnormal; Notable for the following:    RBC 2.77 (*)    Hemoglobin 8.6 (*)  HCT 25.0 (*)    RDW 16.7 (*)    All other components within normal limits  BASIC METABOLIC PANEL - Abnormal; Notable for the following:    Sodium 123 (*)    Chloride 87 (*)    BUN 71 (*)    Creatinine, Ser 2.95 (*)    GFR calc non Af Amer 21 (*)    GFR calc Af Amer 24 (*)    All other components within normal limits  OSMOLALITY - Abnormal; Notable for the following:    Osmolality 302 (*)    All other components within normal limits  BASIC METABOLIC PANEL - Abnormal; Notable for the following:    Sodium 126 (*)    Chloride 91 (*)    Glucose, Bld 117 (*)    BUN 76 (*)    Creatinine, Ser 2.81 (*)    GFR calc non Af Amer 22 (*)    GFR calc Af Amer 25 (*)    All other components within normal limits  BASIC METABOLIC PANEL - Abnormal; Notable for the following:    Sodium 128 (*)    Chloride 95 (*)    CO2 21 (*)    Glucose, Bld 131 (*)    BUN 76 (*)    Creatinine, Ser 2.52 (*)    GFR calc non Af Amer 25 (*)    GFR calc Af Amer 29 (*)    All other components within normal limits  BASIC METABOLIC PANEL - Abnormal; Notable for the following:    Sodium 126 (*)    Potassium 5.3 (*)    Chloride 96 (*)    CO2 21 (*)    Glucose, Bld 128 (*)    BUN 73 (*)    Creatinine, Ser 2.36 (*)    GFR calc non Af Amer 27 (*)    GFR calc Af Amer 31 (*)    All other components within normal limits    CULTURE, BLOOD (ROUTINE X 2)  CULTURE, BLOOD (ROUTINE X 2)  MRSA PCR SCREENING  PROTEIN, BODY FLUID  GLUCOSE, SEROUS FLUID  OSMOLALITY, URINE  RENAL FUNCTION PANEL  CBC  TSH  URINALYSIS, ROUTINE W REFLEX MICROSCOPIC (NOT AT [facility])  I-STAT CG4 LACTIC ACID, ED  CYTOLOGY - NON PAP    Imaging Review Dg Chest 2 View  07/15/2015  CLINICAL DATA:  Known multiple myeloma. Shortness of breath and pleural effusions EXAM: CHEST  2 VIEW COMPARISON:  July 14, 2015 chest radiograph ; chest CT July 07, 2015 FINDINGS: Fairly small pleural effusions bilaterally remain stable. There is atelectatic change in the bases. No frank edema or consolidation. Heart is upper normal in size with pulmonary vascularity within normal limits. The anterior mediastinal mass is seen on recent CT are less well seen radiographically. There is soft tissue fullness in the anterior mediastinum, stable radiographically. There are surgical clips anteriorly on the left. There is a destructive lytic bone lesion involving much of the right anterior and lateral seventh rib. Associated soft tissue mass present in this area as well. IMPRESSION: Persistent small pleural effusions. Mild bibasilar atelectasis. Anterior mediastinal masses, better seen by CT. Destructive lesion right seventh rib with soft tissue mass. Electronically Signed   By: Bretta Bang III M.D.   On: 07/15/2015 10:12   Dg Chest 2 View  07/14/2015  CLINICAL DATA:  Bilateral pleural effusions. Personal history of right-sided pulmonary nodule cup multiple myeloma, bladder cancer. EXAM: CHEST - 2 VIEW COMPARISON:  Two-view chest x-ray 07/12/2015. CT  of the chest 07/07/2015. FINDINGS: The heart size is exaggerated by low lung volumes. Bilateral pleural effusions are slightly increased. Anterior mediastinal mass is again noted. There is no significant edema. Bibasilar airspace disease likely reflects atelectasis. Surgical clips are again seen in the left axilla. IMPRESSION: 1.  Slight increase in bilateral pleural effusions and associated atelectasis. 2. Anterior mediastinal mass. Electronically Signed   By: San Morelle M.D.   On: 07/14/2015 10:17   Dg Abd 1 View  07/14/2015  CLINICAL DATA:  Fecal impaction.  Pleural effusion. EXAM: ABDOMEN - 1 VIEW COMPARISON:  One-view abdomen 07/12/2015. FINDINGS: Moderate stool is noted throughout the colon, slightly decreased from the prior exam. There is no obstruction. The bowel gas pattern is otherwise normal. Degenerative changes are again noted in the lower lumbar spine. IMPRESSION: 1. Improving moderate stool burden. 2. No associated obstruction. Electronically Signed   By: San Morelle M.D.   On: 07/14/2015 10:11   I have personally reviewed and evaluated these images and lab results as part of my medical decision-making.   EKG Interpretation   Date/Time:  Sunday July 07 2015 08:30:14 EST Ventricular Rate:  106 PR Interval:  159 QRS Duration: 74 QT Interval:  317 QTC Calculation: 421 R Axis:   83 Text Interpretation:  Sinus tachycardia Borderline right axis deviation  Consider anterior infarct Borderline repolarization abnormality Artifact  in lead(s) V5 Confirmed by Jeneen Rinks  MD, [place] ([id number]) on 07/07/2015 8:40:19 AM      MDM   Final diagnoses:  Dyspnea  Pleural effusion  Multiple myeloma, remission status unspecified ([place])    With history of multiple myeloma with progression despite 2 different chemotherapy regimens. Known retroperitoneal mass. No new neck, and groin mass. Chest x-ray showed pneumonia. IV antibiotic requested. CT noncontrast of chest and abdomen requested due to new mass.  11:05:  Patient with new and progressive dyspnea. Worsening bilateral pleural effusions right greater than left. Marked development and worsening of diffuse adenopathy including:  Mediastinal, retrosternal--with mass effect on the heart, and suggestion of of prominent venous structures. Anterior chest wall  masses, enlargement of previously noted left renal/retroperitoneal mass, aortic lymphadenopathy, inguinal regions, paracolic gutter adenopathy, mesenteric adenopath.  Patient has JVD and effusions. Does not show other areas of plethora to suggest SVC syndrome. He is dyspneic laying supine, but is not hypoxemic and is less symptomatic sitting upright.  Does not have tamponade physiology.  I will discuss disposition with hospitalist. I think at least patient will need therapeutic thoracentesis. Will likely need echocardiogram to evaluate cardiac function with a suggestion of mass effect on the heart and JVD.  11:35:  Discussed with Dr. [name], the patient's oncologist. 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 He states that he will arrange for the patient be seen and evaluated for patient therapy considering the marked tumor bulk in his chest. Discussed case with [facility] hospitalist, patient will be admitted to a monitored bed. Also discussed with intensivist. They will attempt to see the person to discuss thoracentesis today. Not able to be obtained performed today, will be scheduled through IR tomorrow morning. They will communicate this with the admitting hospitalist.    Tanna Furry, MD 07/15/15 2100

## 2015-07-07 NOTE — Progress Notes (Signed)
PHARMACY NOTE -  CAP Treatment   Pharmacy has been assisting with dosing of Rocephin and Zithromax for CAP.  Dosage remains stable at ceftriaxone 1g IV q24 hr and azithromycin 500 mg IV q24 hr; neither medication is renally adjusted  Will sign off at this time.  Please reconsult if a change in clinical status warrants re-evaluation of dosage.  Reuel Boom, PharmD, BCPS Pager: [phone] 07/07/2015, 1:55 PM

## 2015-07-07 NOTE — H&P (Signed)
[facility]  History and Physical Bora Bost L. Ardeth Perfect, MD Pager [phone] (if 7P to 7A, page night hospitalist on [email or web address]) Cell [phone]  Bea Graff FVC:[id number] DOB: December 27, 1945 DOA: 07/07/2015  Referring physician: ED  PCP: [name], MD   Chief Complaint: dyspnea and groin pain   HPI:  Past medical history including multiple myeloma. Diagnosis in 2016. Followed by Dr. [name] at [facility] oncology. Status post radiation therapy to right lateral chest wall and seventh rib tumor bulk completed a 2615. Systemic chemotherapy with Velcade/Revlimid/Decadron DC'd 11/26 2/2 disease progression. Systemic chemotherapy with Carfilzomib/Cytoxan/Decadron DC'd 3/1 2/2 progression. Systemic chemotherapy with /Pomalyst/DecadronCarfilzomib Initiated 3/1. (4 days ago)  Patient presents w/ progressive dyspnea . CT C/A/P shows progression of involvement from MM as stated in image report below. Concerning are the pleural effusions and cardiac involvement . Dr [name] and CCM were consulted in ED . Mohamed recommended decadron '4mg'$  qid and he will see patient in AM . CCM will perform therapeutic thoracentesis in the next 24 hours . Patient is dyspneic when flat but only mildly labored when upright . Does have R groin pain . No other issues today   Chart Review:  ED notes reveiwed   Review of Systems:  Has groin pain and dyspnea   Past Medical History  Diagnosis Date  . Pulmonary nodule, right     W/  CHEST WALL MASS--  SCHEDULED FOR BIOPSY W/ DR [name] ON FRIDAY 10-27-2013  . History of seizures as a child     AGE 38 OR 8--- NONE ISSUE SINCE  . History of GI bleed     SECONDARY TO ULCER --  DEC 2011  RESOLVED PER LAST EGD 07-05-2010  . Bladder tumor   . Hematuria   . Radiation 12/07/13-12/27/13    right anterior chest 37.5 gray  . Fracture of radius 09/29/14    left  . Cancer ([place])     multiple myeloma, bladder cancer 2015  . Encounter for antineoplastic chemotherapy  05/12/2015    Past Surgical History  Procedure Laterality Date  . Excision benign left axilla tumor  1993  . Esophagogastroduodenoscopy  X5   LAST ONE 07-05-2010  . Transurethral resection of bladder tumor with gyrus (turbt-gyrus) N/A 10/25/2013    Procedure: TRANSURETHRAL RESECTION OF BLADDER TUMOR WITH GYRUS (TURBT-GYRUS);  Surgeon: Sharyn Creamer, MD;  Location: [facility]  Service: Urology;  Laterality: N/A;  . Cystoscopy with biopsy N/A 10/25/2013    Procedure: CYSTOSCOPY;  Surgeon: Sharyn Creamer, MD;  Location: [facility];  Service: Urology;  Laterality: N/A;  . Bone biopsy Right 11/14/13     right seventh rib lesion  . Orif radial fracture Left 10/22/2014    Procedure: Intramedullary nailing left radius fracture;  Surgeon: Milly Jakob, MD;  Location: [facility];  Service: Orthopedics;  Laterality: Left;  OPEN TREATMENT LEFT RADIUS FRACTURE  . Cystoscopy with retrograde pyelogram, ureteroscopy and stent placement N/A 02/21/2015    Procedure: CYSTOSCOPY, EXAM UNDER ANESTHESIA, FOLEY CATHETER PLACEMENT;  Surgeon: Festus Aloe, MD;  Location: [facility];  Service: Urology;  Laterality: N/A;    Social History:  reports that he quit smoking about 46 years ago. His smoking use included Cigarettes. He has a .1 pack-year smoking history. He has never used smokeless tobacco. He reports that he does not drink alcohol or use illicit drugs.  No Known Allergies  Family History  Problem Relation Age of Onset  . Heart disease Mother   .  Cancer - Prostate Brother      Prior to Admission medications   Medication Sig Start Date End Date Taking? Authorizing Provider  acetaminophen (TYLENOL) 325 MG tablet Take 2 tablets (650 mg total) by mouth every 4 (four) hours as needed for mild pain, fever or headache. 03/25/15   Robbie Lis, MD  amLODipine (NORVASC) 5 MG tablet Take 1 tablet (5 mg total) by mouth daily. 06/10/15   Marletta Lor, MD   dexamethasone (DECADRON) 4 MG tablet 10 tablet by mouth every week starting with the first dose of chemotherapy. 04/02/15   [name], MD  finasteride (PROSCAR) 5 MG tablet Take 1 tablet (5 mg total) by mouth daily. 02/23/15   Theodis Blaze, MD  hydrALAZINE (APRESOLINE) 50 MG tablet Take 1 tablet (50 mg total) by mouth every 8 (eight) hours. 05/08/15   Marletta Lor, MD  HYDROcodone-acetaminophen (NORCO/VICODIN) 5-325 MG tablet Take 1-2 tablets by mouth every 4 (four) hours as needed for moderate pain. 02/23/15   Theodis Blaze, MD  lidocaine-prilocaine (EMLA) cream Apply 1 application topically as needed. 06/05/15   [name], MD  pomalidomide (POMALYST) 3 MG capsule Take 1 capsule (3 mg total) by mouth daily. Take with water on days 1-21. Repeat every 28 days. 07/03/15   [name], MD  prochlorperazine (COMPAZINE) 10 MG tablet Take 1 tablet (10 mg total) by mouth every 6 (six) hours as needed for nausea or vomiting. 04/08/15   [name], MD  tamsulosin (FLOMAX) 0.4 MG CAPS capsule Take 1 capsule (0.4 mg total) by mouth daily. Patient taking differently: Take 0.4 mg by mouth 2 (two) times daily.  02/23/15   Theodis Blaze, MD   Physical Exam: Filed Vitals:   07/07/15 0825  BP: 136/96  Pulse: 107  TempSrc: Oral  Resp: 17  SpO2: 97%     General:  AAM w/ mild increase work of breathing   HEENT: dry MM, anicteric   Cardiovascular: RRR, no mRG , JVD present , no edema   Respiratory: decreased sounds in b/l bases , mild wheezes in b/l upper fileds   Abdomen: soft, NT , ND   Skin: warm, dry   Musculoskeletal: no focal deficits   Psychiatric: slight anxiety , no depression   Neurologic: no focal deficits   Wt Readings from Last 3 Encounters:  07/03/15 85.911 kg (189 lb 6.4 oz)  06/27/15 87.091 kg (192 lb)  06/17/15 88.451 kg (195 lb)    Labs on Admission:  Basic Metabolic Panel:  Recent Labs Lab 07/03/15 0936 07/07/15 0910  NA 131* 126*  K 4.8 5.2*   CL  --  98*  CO2 20* 19*  GLUCOSE 150* 111*  BUN 20.8 17  CREATININE 2.7* 2.52*  CALCIUM 9.1 9.1   Liver Function Tests:  Recent Labs Lab 07/03/15 0936 07/07/15 0910  AST 14 20  ALT 10 13*  ALKPHOS 44 40  BILITOT 1.04 1.0  PROT 8.9* 9.1*  ALBUMIN 3.3* 3.7   No results for input(s): LIPASE, AMYLASE in the last 168 hours. No results for input(s): AMMONIA in the last 168 hours. CBC:  Recent Labs Lab 07/03/15 0936 07/07/15 0910  WBC 4.9 4.0  NEUTROABS 3.1 2.6  HGB 7.5* 9.2*  HCT 22.9* 27.9*  MCV 99.9* 95.2  PLT 223 234   Cardiac Enzymes: No results for input(s): CKTOTAL, CKMB, CKMBINDEX, TROPONINI in the last 168 hours.  BNP (last 3 results) No results for input(s): BNP in the last 8760  hours.  ProBNP (last 3 results) No results for input(s): PROBNP in the last 8760 hours.  CBG: No results for input(s): GLUCAP in the last 168 hours.  Radiological Exams on Admission: Ct Abdomen Pelvis Wo Contrast  07/07/2015  CLINICAL DATA:  Shortness of breath for 2 weeks, also right groin pain with known hernia in that region, personal history of multiple myeloma and bladder cancer EXAM: CT CHEST, ABDOMEN AND PELVIS WITHOUT CONTRAST TECHNIQUE: Multidetector CT imaging of the chest, abdomen and pelvis was performed following the standard protocol without IV contrast. COMPARISON:  Numerous prior CT scans and radiographs FINDINGS: CT CHEST FINDINGS Mediastinum/Lymph Nodes: On this non contrasted study there is bulky nodularity in the retrosternal region of the superior mediastinum. This likely represents a combination of prominent venous structures +adenopathy. The heart is mildly enlarged, and there is mass effect on the heart anteriorly from bulky multinodular soft tissue density interposed between the anterior chest wall in the anterior pericardium. This extends from left to right measuring 17 cm. This process demonstrates a thickness of 3.4 cm. Lungs/Pleura: There is a small to  moderate left pleural effusion and there is a large right pleural effusion. Minimal aerated right lung is involved with atelectasis. Aerated left lung is also involved with mild compressive atelectasis. The lungs are otherwise clear allowing for the presence of respiratory motion. Musculoskeletal: There are bilateral paramedian anterior chest wall masses just inferior to the sternomanubrial joint. These were not present previously. These measure about 2.5 cm in AP diameter maximally. This process continues inferiorly to blend with the soft tissue mass anterior to the heart described above. Mass along the right lateral seventh rib again identified, smaller than on the prior study, today measuring approximately 5.7 x 3.1 cm as compared to 6.5 x 3.8 cm previously. CT ABDOMEN PELVIS FINDINGS Hepatobiliary: No focal abnormalities given limited non contrasted evaluation. Gallbladder is negative. Pancreas: Pancreatic tail is draped anteriorly around an enlarged mass arising anteriorly off of the left kidney. On 03/20/2015 this mass measured 46 x 50 mm. It is markedly enlarged today measuring 78 x 104 mm. Spleen: No significant abnormalities Adrenals/Urinary Tract: Adrenal glands negative. Right kidney demonstrates mild hydronephrosis. Right hydronephrosis and hydroureter slightly less prominent when compared to 03/20/2015. There is severe diffuse bladder wall thickening, with a lobulated bladder mass again identified similar to prior studies. Stomach/Bowel: Nonobstructive gas pattern. New no focal bowel abnormalities. Vascular/Lymphatic: No evidence of aortic dilatation. There is adenopathy inferior to the aortic bifurcation measuring about 2.7 cm in short axis. There is extensive adenopathy in the bilateral inguinal regions right greater than left. Largest right inguinal lymph node measures about 3.8 cm in short axis. Soft tissue material within the right inguinal canal appears consistent with adenopathy within inguinal  hernia. No definite bowel involvement. There is peritoneal nodularity in the pericolic gutter on the left measuring 43 by 25 mm on image number 59. This was not present previously. Mesenteric adenopathy left lower quadrant also new or increased with the largest lymph node measuring 15 mm. Along the right lateral margin of the liver there is a lentiform 5 x 2.5 cm mass indenting liver contour suggesting capsular implant. Reproductive: Bilateral inguinal hernias containing fat. Additionally, as described above, soft tissue nodules are seen within the right inguinal hernia suggesting adenopathy. Other: There is trace ascites present in the abdomen and pelvis Musculoskeletal: No evidence of vertebra plana. No acute musculoskeletal findings. IMPRESSION: 1. Small to moderate left and large right pleural effusion. Proportional bilateral volume  loss as a result. These are new from 03/20/2015. 2. Mass effect on the anterior heart border may be hemodynamically significant. The jugular veins appear more prominent than they did previously. 3. Bulky multinodular soft tissue mass interposed between the anterior chest wall and anterior pericardium causing mass effect, likely reflecting extensive mediastinal adenopathy. There is similar fullness in the superior mediastinum which likely reflects a combination of venous engorgement and adenopathy. 4. Right chest wall mass persists but is smaller when compared to prior study. 5. Market enlargement of left periaortic mass inseparable from left kidney. This causes significant mass effect upon the left kidney as well as on the tail of the pancreas and stomach. 6. Evidence of new mesenteric adenopathy and peritoneal implants as well as inguinal adenopathy. 7. Severe diffuse bladder wall thickening. Lobulated inferior posterior bladder mass again identified. 8. Moderate right hydroureteronephrosis slightly less pronounced when compared to the prior study. Electronically Signed   By:  Skipper Cliche M.D.   On: 07/07/2015 10:33   Dg Chest 2 View  07/07/2015  CLINICAL DATA:  Shortness of breath for 2 weeks with some anterior left chest pain, history of multiple myeloma and bladder cancer EXAM: CHEST  2 VIEW COMPARISON:  06/26/2015 FINDINGS: Mildly enlarged cardiac silhouette. Elevated right diaphragm. Bibasilar opacities suggesting a combination of airspace disease and pleural effusion. Stable right Port-A-Cath. IMPRESSION: Bilateral lower lobe opacity right worse than left suggesting a combination of pleural effusion and consolidation. Electronically Signed   By: Skipper Cliche M.D.   On: 07/07/2015 09:05   Ct Chest Wo Contrast  07/07/2015  CLINICAL DATA:  Shortness of breath for 2 weeks, also right groin pain with known hernia in that region, personal history of multiple myeloma and bladder cancer EXAM: CT CHEST, ABDOMEN AND PELVIS WITHOUT CONTRAST TECHNIQUE: Multidetector CT imaging of the chest, abdomen and pelvis was performed following the standard protocol without IV contrast. COMPARISON:  Numerous prior CT scans and radiographs FINDINGS: CT CHEST FINDINGS Mediastinum/Lymph Nodes: On this non contrasted study there is bulky nodularity in the retrosternal region of the superior mediastinum. This likely represents a combination of prominent venous structures +adenopathy. The heart is mildly enlarged, and there is mass effect on the heart anteriorly from bulky multinodular soft tissue density interposed between the anterior chest wall in the anterior pericardium. This extends from left to right measuring 17 cm. This process demonstrates a thickness of 3.4 cm. Lungs/Pleura: There is a small to moderate left pleural effusion and there is a large right pleural effusion. Minimal aerated right lung is involved with atelectasis. Aerated left lung is also involved with mild compressive atelectasis. The lungs are otherwise clear allowing for the presence of respiratory motion. Musculoskeletal:  There are bilateral paramedian anterior chest wall masses just inferior to the sternomanubrial joint. These were not present previously. These measure about 2.5 cm in AP diameter maximally. This process continues inferiorly to blend with the soft tissue mass anterior to the heart described above. Mass along the right lateral seventh rib again identified, smaller than on the prior study, today measuring approximately 5.7 x 3.1 cm as compared to 6.5 x 3.8 cm previously. CT ABDOMEN PELVIS FINDINGS Hepatobiliary: No focal abnormalities given limited non contrasted evaluation. Gallbladder is negative. Pancreas: Pancreatic tail is draped anteriorly around an enlarged mass arising anteriorly off of the left kidney. On 03/20/2015 this mass measured 46 x 50 mm. It is markedly enlarged today measuring 78 x 104 mm. Spleen: No significant abnormalities Adrenals/Urinary Tract: Adrenal glands  negative. Right kidney demonstrates mild hydronephrosis. Right hydronephrosis and hydroureter slightly less prominent when compared to 03/20/2015. There is severe diffuse bladder wall thickening, with a lobulated bladder mass again identified similar to prior studies. Stomach/Bowel: Nonobstructive gas pattern. New no focal bowel abnormalities. Vascular/Lymphatic: No evidence of aortic dilatation. There is adenopathy inferior to the aortic bifurcation measuring about 2.7 cm in short axis. There is extensive adenopathy in the bilateral inguinal regions right greater than left. Largest right inguinal lymph node measures about 3.8 cm in short axis. Soft tissue material within the right inguinal canal appears consistent with adenopathy within inguinal hernia. No definite bowel involvement. There is peritoneal nodularity in the pericolic gutter on the left measuring 43 by 25 mm on image number 59. This was not present previously. Mesenteric adenopathy left lower quadrant also new or increased with the largest lymph node measuring 15 mm. Along the  right lateral margin of the liver there is a lentiform 5 x 2.5 cm mass indenting liver contour suggesting capsular implant. Reproductive: Bilateral inguinal hernias containing fat. Additionally, as described above, soft tissue nodules are seen within the right inguinal hernia suggesting adenopathy. Other: There is trace ascites present in the abdomen and pelvis Musculoskeletal: No evidence of vertebra plana. No acute musculoskeletal findings. IMPRESSION: 1. Small to moderate left and large right pleural effusion. Proportional bilateral volume loss as a result. These are new from 03/20/2015. 2. Mass effect on the anterior heart border may be hemodynamically significant. The jugular veins appear more prominent than they did previously. 3. Bulky multinodular soft tissue mass interposed between the anterior chest wall and anterior pericardium causing mass effect, likely reflecting extensive mediastinal adenopathy. There is similar fullness in the superior mediastinum which likely reflects a combination of venous engorgement and adenopathy. 4. Right chest wall mass persists but is smaller when compared to prior study. 5. Market enlargement of left periaortic mass inseparable from left kidney. This causes significant mass effect upon the left kidney as well as on the tail of the pancreas and stomach. 6. Evidence of new mesenteric adenopathy and peritoneal implants as well as inguinal adenopathy. 7. Severe diffuse bladder wall thickening. Lobulated inferior posterior bladder mass again identified. 8. Moderate right hydroureteronephrosis slightly less pronounced when compared to the prior study. Electronically Signed   By: Skipper Cliche M.D.   On: 07/07/2015 10:33          Assessment/Plan 1. Multiple Myeloma - progressed w/ involvement of mediastinum , pleural , chest wall , renal , inguinal , peritoneal , and mesenteric lymphadenopathy . Dr [name] on board and will handle therapy in AM . Given extensive pleural  and cardiac involvement, will place in SDU overnight . Decadron '4mg'$  qid ordered per verbal recs from Dr [name] voiced by ED physician 2. B/L pleural effusions - R is large, L is moderate . ED to contact CCM to see if emergent therapeutic thoracentesis is needed. CCM states they will perform in next 24 hours.  Pt is not clinically decompensated and is not hypoxic at this time  3. Anterior chest wall mets - ordering TTE to be performed to ensure no signs of cardiac compromise from mass effect  4.  R hydronephrosis - chronic 2/2 mets mass effect . Is actually improved from prior image and Cr remains stable . Will montior  5. Hypochloremic Hyponatremia - hydrate w/ NS  6. Hyperkalemia - mild . Hydrate . Recheck in AM   Code Status: full Family Communication: wife at bedside Disposition Plan/Anticipated  LOS: 4+ days   Time spent: 45 minutes  Velna Hatchet, MD  Internal Medicine Pager [phone] Cell [phone] If 7PM-7AM, please contact night-coverage at [email or web address], password [facility] 07/07/2015, 11:25 AM

## 2015-07-07 NOTE — Procedures (Signed)
Thoracentesis Procedure Note  Pre-operative Diagnosis: Large R pleural effusion  Post-operative Diagnosis: same  Indications: Dyspnea, hypoxemia  Procedure Details  Consent: Informed consent was obtained. Risks of the procedure were discussed including: infection, bleeding, pain, pneumothorax.  Under sterile conditions the patient was positioned. Betadine solution and sterile drapes were utilized.  1% buffered lidocaine was used to anesthetize the 6th rib space. Fluid was obtained without any difficulties and minimal blood loss.  A dressing was applied to the wound and wound care instructions were provided.   Findings 1600 ml of cloudy serosanguinous pleural fluid was obtained. A sample was sent to Pathology for cytology, and cell counts, as well as for infection analysis.  Complications:  None; patient tolerated the procedure well.          Condition: stable  Plan A follow up chest x-ray was ordered. Bed Rest for 0 hours. Tylenol 650 mg. for pain.  Attending Attestation: I performed the procedure.  Yisroel Ramming, MD Pulmonary and Critical Care 07/07/2015 11:26 PM

## 2015-07-07 NOTE — Progress Notes (Signed)
eLink Physician-Brief Progress Note Patient Name: Wesley [name] DOB: [birth year] MRN: [id number]   Date of Service  07/07/2015  HPI/Events of Note  Asked by RN to assist - increased WOB Multiple myeloma, CKD with chest masses, BL effusions R>> L , disease progression   eICU Interventions  Lasix 120 Dc lovenox (high cr) Thora when able     Intervention Category Evaluation Type: New Patient Evaluation  [name] V. 07/07/2015, 3:27 PM

## 2015-07-08 ENCOUNTER — Ambulatory Visit
Admit: 2015-07-08 | Discharge: 2015-07-08 | Disposition: A | Discharge status: Home / Self Care | Payer: Medicare HMO | Attending: Radiation Oncology | Admitting: Radiation Oncology

## 2015-07-08 DIAGNOSIS — C9000 Multiple myeloma not having achieved remission: Secondary | ICD-10-CM | POA: Insufficient documentation

## 2015-07-08 DIAGNOSIS — J9601 Acute respiratory failure with hypoxia: Secondary | ICD-10-CM

## 2015-07-08 DIAGNOSIS — R0600 Dyspnea, unspecified: Secondary | ICD-10-CM

## 2015-07-08 DIAGNOSIS — J910 Malignant pleural effusion: Secondary | ICD-10-CM

## 2015-07-08 DIAGNOSIS — N179 Acute kidney failure, unspecified: Secondary | ICD-10-CM

## 2015-07-08 DIAGNOSIS — N183 Chronic kidney disease, stage 3 (moderate): Secondary | ICD-10-CM

## 2015-07-08 LAB — CBC
HEMATOCRIT: 26.9 % — AB (ref 39.0–52.0)
Hemoglobin: 8.9 g/dL — ABNORMAL LOW (ref 13.0–17.0)
MCH: 30.9 pg (ref 26.0–34.0)
MCHC: 33.1 g/dL (ref 30.0–36.0)
MCV: 93.4 fL (ref 78.0–100.0)
PLATELETS: 202 10*3/uL (ref 150–400)
RBC: 2.88 MIL/uL — ABNORMAL LOW (ref 4.22–5.81)
RDW: 16.9 % — AB (ref 11.5–15.5)
WBC: 6.2 10*3/uL (ref 4.0–10.5)

## 2015-07-08 LAB — LACTATE DEHYDROGENASE, PLEURAL OR PERITONEAL FLUID: LD, Fluid: 465 U/L — ABNORMAL HIGH (ref 3–23)

## 2015-07-08 LAB — OSMOLALITY, URINE: Osmolality, Ur: 216 mOsm/kg — ABNORMAL LOW (ref 300–900)

## 2015-07-08 LAB — BODY FLUID CELL COUNT WITH DIFFERENTIAL
EOS FL: 0 %
Lymphs, Fluid: 8 %
MONOCYTE-MACROPHAGE-SEROUS FLUID: 30 % — AB (ref 50–90)
Neutrophil Count, Fluid: 1 % (ref 0–25)
Other Cells, Fluid: 61 %
Total Nucleated Cell Count, Fluid: 5830 cu mm — ABNORMAL HIGH (ref 0–1000)

## 2015-07-08 LAB — BASIC METABOLIC PANEL
Anion gap: 9 (ref 5–15)
BUN: 23 mg/dL — AB (ref 6–20)
CALCIUM: 8.9 mg/dL (ref 8.9–10.3)
CO2: 19 mmol/L — ABNORMAL LOW (ref 22–32)
CREATININE: 2.81 mg/dL — AB (ref 0.61–1.24)
Chloride: 94 mmol/L — ABNORMAL LOW (ref 101–111)
GFR calc Af Amer: 25 mL/min — ABNORMAL LOW (ref >60)
GFR, EST NON AFRICAN AMERICAN: 22 mL/min — AB (ref >60)
GLUCOSE: 130 mg/dL — AB (ref 65–99)
POTASSIUM: 5.4 mmol/L — AB (ref 3.5–5.1)
SODIUM: 122 mmol/L — AB (ref 135–145)

## 2015-07-08 LAB — OSMOLALITY: OSMOLALITY: 268 mosm/kg — AB (ref 275–295)

## 2015-07-08 LAB — PROTEIN, BODY FLUID: TOTAL PROTEIN, FLUID: 6.5 g/dL

## 2015-07-08 LAB — GLUCOSE, SEROUS FLUID: GLUCOSE FL: 43 mg/dL

## 2015-07-08 LAB — PROTIME-INR
INR: 1.28 (ref 0.00–1.49)
Prothrombin Time: 16.1 seconds — ABNORMAL HIGH (ref 11.6–15.2)

## 2015-07-08 LAB — URIC ACID: URIC ACID, SERUM: 12.1 mg/dL — AB (ref 4.4–7.6)

## 2015-07-08 NOTE — Care Management Note (Signed)
Case Management Note  Patient Details  Name: Adarryl Trottier MRN: [id number] Date of Birth: Apr 09, 1946  Subjective/Objective:         Copd, pleura effusion,mm, infection          Action/Plan:Date:  July 08, 2015 Chart reviewed for concurrent status and case management needs. Will continue to follow patient for changes and needs: Velva Harman, BSN, RN, [place]   [phone]  Expected Discharge Date:                  Expected Discharge Plan:  Home/Self Care  In-House Referral:  NA  Discharge planning Services  CM Consult  Post Acute Care Choice:  NA Choice offered to:  NA  DME Arranged:    DME Agency:     HH Arranged:    HH Agency:     Status of Service:  In process, will continue to follow  Medicare Important Message Given:    Date Medicare IM Given:    Medicare IM give by:    Date Additional Medicare IM Given:    Additional Medicare Important Message give by:     If discussed at [place] of Stay Meetings, dates discussed:    Additional Comments:  Leeroy Cha, RN 07/08/2015, 10:28 AM

## 2015-07-08 NOTE — Consult Note (Signed)
Name: Wesley [name] MRN: [id number] DOB: Jan 21, 1946    ADMISSION DATE:  07/07/2015 CONSULTATION DATE:  3/6  REFERRING MD :  [facility] ([facility])   CHIEF COMPLAINT:  Pleural effusions  BRIEF PATIENT DESCRIPTION:  70yo male with hx multiple myeloma (dx may 2016, s/p radiation to R chest wall and tumor debulking 7th rib, scheduled to begin 3rd line chemotherapy r/t disease progression) admitted by [facility] 3/5 with bilateral pleural effusions.  Thoracentesis performed by PCCM, then formal consultation requested for further assistance with effusion management and plan of care.    SIGNIFICANT EVENTS  3/5 R thoracentesis >> 1.6L cloudy fluid removed>>>    STUDIES:  CT chest/abd/pelvis 3/5>>>. Small to moderate left and large right pleural effusion. Proportional bilateral volume loss as a result. These are new from 03/20/2015. 2. Mass effect on the anterior heart border may be hemodynamically significant. The jugular veins appear more prominent than they did previously. 3. Bulky multinodular soft tissue mass interposed between the anterior chest wall and anterior pericardium causing mass effect, likely reflecting extensive mediastinal adenopathy. There is similar fullness in the superior mediastinum which likely reflects a combination of venous engorgement and adenopathy. 4. Right chest wall mass persists but is smaller when compared to prior study. 5. Market enlargement of left periaortic mass inseparable from left kidney. This causes significant mass effect upon the left kidney as well as on the tail of the pancreas and stomach. 6. Evidence of new mesenteric adenopathy and peritoneal implants as well as inguinal adenopathy. 7. Severe diffuse bladder wall thickening. Lobulated inferior posterior bladder mass again identified. 8. Moderate right hydroureteronephrosis slightly less pronounced when compared to the prior study   HISTORY OF PRESENT ILLNESS:   70yo male with hx multiple myeloma (dx  may 2016, s/p radiation to R chest wall and tumor debulking 7th rib, scheduled to begin 3rd line chemotherapy r/t disease progression) admitted by [facility] 3/5 with bilateral pleural effusions.  Thoracentesis performed by PCCM, then formal consultation requested for further assistance with effusion management and plan of care.    Currently feeling much better.  On RA.  Denies chest pain, dyspnea, hemoptysis.  Mild orthopnea.   PAST MEDICAL HISTORY :   has a past medical history of Pulmonary nodule, right; History of seizures as a child; History of GI bleed; Bladder tumor; Hematuria; Radiation (12/07/13-12/27/13); Fracture of radius (09/29/14); [facility]); and Encounter for antineoplastic chemotherapy (05/12/2015).  has past surgical history that includes EXCISION BENIGN LEFT AXILLA TUMOR (1993); Esophagogastroduodenoscopy (X5   LAST ONE 07-05-2010); Transurethral resection of bladder tumor with gyrus (turbt-gyrus) (N/A, 10/25/2013); Cystoscopy with biopsy (N/A, 10/25/2013); Bone biopsy (Right, 11/14/13); ORIF radial fracture (Left, 10/22/2014); and Cystoscopy with retrograde pyelogram, ureteroscopy and stent placement (N/A, 02/21/2015). Prior to Admission medications   Medication Sig Start Date End Date Taking? Authorizing Provider  acetaminophen (TYLENOL) 325 MG tablet Take 2 tablets (650 mg total) by mouth every 4 (four) hours as needed for mild pain, fever or headache. 03/25/15  Yes Robbie Lis, MD  amLODipine (NORVASC) 5 MG tablet Take 1 tablet (5 mg total) by mouth daily. 06/10/15  Yes Marletta Lor, MD  dexamethasone (DECADRON) 4 MG tablet 10 tablet by mouth every week starting with the first dose of chemotherapy. Patient taking differently: Take 40 mg by mouth once a week. starting with the first dose of chemotherapy. 04/02/15  Yes [name], MD  finasteride (PROSCAR) 5 MG tablet Take 1 tablet (5 mg total) by mouth daily. 02/23/15  Yes Iskra  Barbera Setters, MD  hydrALAZINE (APRESOLINE) 50 MG tablet  Take 1 tablet (50 mg total) by mouth every 8 (eight) hours. 05/08/15  Yes Marletta Lor, MD  HYDROcodone-acetaminophen (NORCO/VICODIN) 5-325 MG tablet Take 1-2 tablets by mouth every 4 (four) hours as needed for moderate pain. 02/23/15  Yes Theodis Blaze, MD  lactose free nutrition (BOOST) LIQD Take 237 mLs by mouth 2 (two) times daily.   Yes Historical Provider, MD  lidocaine-prilocaine (EMLA) cream Apply 1 application topically as needed. Patient taking differently: Apply 1 application topically as needed (for port).  06/05/15  Yes [name], MD  [place]   Yes Historical Provider, MD  prochlorperazine (COMPAZINE) 10 MG tablet Take 1 tablet (10 mg total) by mouth every 6 (six) hours as needed for nausea or vomiting. 04/08/15  Yes [name], MD  tamsulosin (FLOMAX) 0.4 MG CAPS capsule Take 1 capsule (0.4 mg total) by mouth daily. Patient taking differently: Take 0.4 mg by mouth 2 (two) times daily.  02/23/15  Yes Theodis Blaze, MD  pomalidomide (POMALYST) 3 MG capsule Take 1 capsule (3 mg total) by mouth daily. Take with water on days 1-21. Repeat every 28 days. Patient not taking: Reported on 07/07/2015 07/03/15   [name], MD   No Known Allergies  FAMILY HISTORY:  family history includes Cancer - Prostate in his brother; Heart disease in his mother. SOCIAL HISTORY:  reports that he quit smoking about 46 years ago. His smoking use included Cigarettes. He has a .1 pack-year smoking history. He has never used smokeless tobacco. He reports that he does not drink alcohol or use illicit drugs.  REVIEW OF SYSTEMS:   As per HPI - All other systems reviewed and were neg.    SUBJECTIVE:   VITAL SIGNS: Temp:  [97.9 F (36.6 C)-98.6 F (37 C)] 97.9 F (36.6 C) (03/06 0806) Pulse Rate:  [101-124] 111 (03/06 0956) Resp:  [11-29] 18 (03/06 0956) BP: (120-181)/(81-117) 146/86 mmHg (03/06 0800) SpO2:  [98 %-100 %] 100 % (03/06 0956) Weight:  [88.4 kg  (194 lb 14.2 oz)] 88.4 kg (194 lb 14.2 oz) (03/06 0423)  PHYSICAL EXAMINATION: General:  Pleasant male, NAD in bed  Neuro:  Awake, alert, appropriate, MAE  HEENT:  Mm moist, no JVD  Cardiovascular:  s1s2 rrr Lungs:  resps even non labored on RA, diminished bases L>R, dullness to  Abdomen:  Round, soft, non tender, +bs  Musculoskeletal:  Warm and dry, no edema     Recent Labs Lab 07/07/15 0910 07/07/15 1349 07/08/15 0400  NA 126* 123* 122*  K 5.2* 5.5* 5.4*  CL 98* 95* 94*  CO2 19* 17* 19*  BUN 17 17 23*  CREATININE 2.52* 2.39* 2.81*  GLUCOSE 111* 134* 130*    Recent Labs Lab 07/07/15 0910 07/07/15 1349 07/08/15 0400  HGB 9.2* 9.4* 8.9*  HCT 27.9* 28.1* 26.9*  WBC 4.0 3.3* 6.2  PLT 234 196 202   Ct Abdomen Pelvis Wo Contrast  07/07/2015  CLINICAL DATA:  Shortness of breath for 2 weeks, also right groin pain with known hernia in that region, personal history of multiple myeloma and bladder cancer EXAM: CT CHEST, ABDOMEN AND PELVIS WITHOUT CONTRAST TECHNIQUE: Multidetector CT imaging of the chest, abdomen and pelvis was performed following the standard protocol without IV contrast. COMPARISON:  Numerous prior CT scans and radiographs FINDINGS: CT CHEST FINDINGS Mediastinum/Lymph Nodes: On this non contrasted study there is bulky nodularity in the retrosternal region of the  superior mediastinum. This likely represents a combination of prominent venous structures +adenopathy. The heart is mildly enlarged, and there is mass effect on the heart anteriorly from bulky multinodular soft tissue density interposed between the anterior chest wall in the anterior pericardium. This extends from left to right measuring 17 cm. This process demonstrates a thickness of 3.4 cm. Lungs/Pleura: There is a small to moderate left pleural effusion and there is a large right pleural effusion. Minimal aerated right lung is involved with atelectasis. Aerated left lung is also involved with mild compressive  atelectasis. The lungs are otherwise clear allowing for the presence of respiratory motion. Musculoskeletal: There are bilateral paramedian anterior chest wall masses just inferior to the sternomanubrial joint. These were not present previously. These measure about 2.5 cm in AP diameter maximally. This process continues inferiorly to blend with the soft tissue mass anterior to the heart described above. Mass along the right lateral seventh rib again identified, smaller than on the prior study, today measuring approximately 5.7 x 3.1 cm as compared to 6.5 x 3.8 cm previously. CT ABDOMEN PELVIS FINDINGS Hepatobiliary: No focal abnormalities given limited non contrasted evaluation. Gallbladder is negative. Pancreas: Pancreatic tail is draped anteriorly around an enlarged mass arising anteriorly off of the left kidney. On 03/20/2015 this mass measured 46 x 50 mm. It is markedly enlarged today measuring 78 x 104 mm. Spleen: No significant abnormalities Adrenals/Urinary Tract: Adrenal glands negative. Right kidney demonstrates mild hydronephrosis. Right hydronephrosis and hydroureter slightly less prominent when compared to 03/20/2015. There is severe diffuse bladder wall thickening, with a lobulated bladder mass again identified similar to prior studies. Stomach/Bowel: Nonobstructive gas pattern. New no focal bowel abnormalities. Vascular/Lymphatic: No evidence of aortic dilatation. There is adenopathy inferior to the aortic bifurcation measuring about 2.7 cm in short axis. There is extensive adenopathy in the bilateral inguinal regions right greater than left. Largest right inguinal lymph node measures about 3.8 cm in short axis. Soft tissue material within the right inguinal canal appears consistent with adenopathy within inguinal hernia. No definite bowel involvement. There is peritoneal nodularity in the pericolic gutter on the left measuring 43 by 25 mm on image number 59. This was not present previously. Mesenteric  adenopathy left lower quadrant also new or increased with the largest lymph node measuring 15 mm. Along the right lateral margin of the liver there is a lentiform 5 x 2.5 cm mass indenting liver contour suggesting capsular implant. Reproductive: Bilateral inguinal hernias containing fat. Additionally, as described above, soft tissue nodules are seen within the right inguinal hernia suggesting adenopathy. Other: There is trace ascites present in the abdomen and pelvis Musculoskeletal: No evidence of vertebra plana. No acute musculoskeletal findings. IMPRESSION: 1. Small to moderate left and large right pleural effusion. Proportional bilateral volume loss as a result. These are new from 03/20/2015. 2. Mass effect on the anterior heart border may be hemodynamically significant. The jugular veins appear more prominent than they did previously. 3. Bulky multinodular soft tissue mass interposed between the anterior chest wall and anterior pericardium causing mass effect, likely reflecting extensive mediastinal adenopathy. There is similar fullness in the superior mediastinum which likely reflects a combination of venous engorgement and adenopathy. 4. Right chest wall mass persists but is smaller when compared to prior study. 5. Market enlargement of left periaortic mass inseparable from left kidney. This causes significant mass effect upon the left kidney as well as on the tail of the pancreas and stomach. 6. Evidence of new mesenteric adenopathy and  peritoneal implants as well as inguinal adenopathy. 7. Severe diffuse bladder wall thickening. Lobulated inferior posterior bladder mass again identified. 8. Moderate right hydroureteronephrosis slightly less pronounced when compared to the prior study. Electronically Signed   By: Skipper Cliche M.D.   On: 07/07/2015 10:33   Dg Chest 1 View  07/08/2015  CLINICAL DATA:  Post right thoracentesis. History of right pulmonary nodule, bladder tumor, multiple myeloma. EXAM: CHEST  1 VIEW COMPARISON:  07/07/2015 FINDINGS: Shallow inspiration. Bilateral pleural effusions again demonstrated. Left pleural effusion appears increased since previous study, possibly due to shallow inspiration. Mild cardiac enlargement. Atelectasis in the lung bases. No pneumothorax. Power port type central venous catheter with tip over the low SVC region. IMPRESSION: Bilateral pleural effusions and basilar atelectasis, possibly increasing on the left. No pneumothorax. Electronically Signed   By: Lucienne Capers M.D.   On: 07/08/2015 00:32   Dg Chest 2 View  07/07/2015  CLINICAL DATA:  Shortness of breath for 2 weeks with some anterior left chest pain, history of multiple myeloma and bladder cancer EXAM: CHEST  2 VIEW COMPARISON:  06/26/2015 FINDINGS: Mildly enlarged cardiac silhouette. Elevated right diaphragm. Bibasilar opacities suggesting a combination of airspace disease and pleural effusion. Stable right Port-A-Cath. IMPRESSION: Bilateral lower lobe opacity right worse than left suggesting a combination of pleural effusion and consolidation. Electronically Signed   By: Skipper Cliche M.D.   On: 07/07/2015 09:05   Ct Chest Wo Contrast  07/07/2015  CLINICAL DATA:  Shortness of breath for 2 weeks, also right groin pain with known hernia in that region, personal history of multiple myeloma and bladder cancer EXAM: CT CHEST, ABDOMEN AND PELVIS WITHOUT CONTRAST TECHNIQUE: Multidetector CT imaging of the chest, abdomen and pelvis was performed following the standard protocol without IV contrast. COMPARISON:  Numerous prior CT scans and radiographs FINDINGS: CT CHEST FINDINGS Mediastinum/Lymph Nodes: On this non contrasted study there is bulky nodularity in the retrosternal region of the superior mediastinum. This likely represents a combination of prominent venous structures +adenopathy. The heart is mildly enlarged, and there is mass effect on the heart anteriorly from bulky multinodular soft tissue density  interposed between the anterior chest wall in the anterior pericardium. This extends from left to right measuring 17 cm. This process demonstrates a thickness of 3.4 cm. Lungs/Pleura: There is a small to moderate left pleural effusion and there is a large right pleural effusion. Minimal aerated right lung is involved with atelectasis. Aerated left lung is also involved with mild compressive atelectasis. The lungs are otherwise clear allowing for the presence of respiratory motion. Musculoskeletal: There are bilateral paramedian anterior chest wall masses just inferior to the sternomanubrial joint. These were not present previously. These measure about 2.5 cm in AP diameter maximally. This process continues inferiorly to blend with the soft tissue mass anterior to the heart described above. Mass along the right lateral seventh rib again identified, smaller than on the prior study, today measuring approximately 5.7 x 3.1 cm as compared to 6.5 x 3.8 cm previously. CT ABDOMEN PELVIS FINDINGS Hepatobiliary: No focal abnormalities given limited non contrasted evaluation. Gallbladder is negative. Pancreas: Pancreatic tail is draped anteriorly around an enlarged mass arising anteriorly off of the left kidney. On 03/20/2015 this mass measured 46 x 50 mm. It is markedly enlarged today measuring 78 x 104 mm. Spleen: No significant abnormalities Adrenals/Urinary Tract: Adrenal glands negative. Right kidney demonstrates mild hydronephrosis. Right hydronephrosis and hydroureter slightly less prominent when compared to 03/20/2015. There is severe diffuse  bladder wall thickening, with a lobulated bladder mass again identified similar to prior studies. Stomach/Bowel: Nonobstructive gas pattern. New no focal bowel abnormalities. Vascular/Lymphatic: No evidence of aortic dilatation. There is adenopathy inferior to the aortic bifurcation measuring about 2.7 cm in short axis. There is extensive adenopathy in the bilateral inguinal  regions right greater than left. Largest right inguinal lymph node measures about 3.8 cm in short axis. Soft tissue material within the right inguinal canal appears consistent with adenopathy within inguinal hernia. No definite bowel involvement. There is peritoneal nodularity in the pericolic gutter on the left measuring 43 by 25 mm on image number 59. This was not present previously. Mesenteric adenopathy left lower quadrant also new or increased with the largest lymph node measuring 15 mm. Along the right lateral margin of the liver there is a lentiform 5 x 2.5 cm mass indenting liver contour suggesting capsular implant. Reproductive: Bilateral inguinal hernias containing fat. Additionally, as described above, soft tissue nodules are seen within the right inguinal hernia suggesting adenopathy. Other: There is trace ascites present in the abdomen and pelvis Musculoskeletal: No evidence of vertebra plana. No acute musculoskeletal findings. IMPRESSION: 1. Small to moderate left and large right pleural effusion. Proportional bilateral volume loss as a result. These are new from 03/20/2015. 2. Mass effect on the anterior heart border may be hemodynamically significant. The jugular veins appear more prominent than they did previously. 3. Bulky multinodular soft tissue mass interposed between the anterior chest wall and anterior pericardium causing mass effect, likely reflecting extensive mediastinal adenopathy. There is similar fullness in the superior mediastinum which likely reflects a combination of venous engorgement and adenopathy. 4. Right chest wall mass persists but is smaller when compared to prior study. 5. Market enlargement of left periaortic mass inseparable from left kidney. This causes significant mass effect upon the left kidney as well as on the tail of the pancreas and stomach. 6. Evidence of new mesenteric adenopathy and peritoneal implants as well as inguinal adenopathy. 7. Severe diffuse bladder  wall thickening. Lobulated inferior posterior bladder mass again identified. 8. Moderate right hydroureteronephrosis slightly less pronounced when compared to the prior study. Electronically Signed   By: Skipper Cliche M.D.   On: 07/07/2015 10:33    ASSESSMENT / PLAN:  Bilat pleural effusion - suspect malignant effusions in setting rapidly progressive multiple myeloma.  Respiratory status much improved after thoracentesis.   AKI on CKD3- in setting diuresis and disease progression with mass effect on kidneys  Hyponatremia  Hyperkalemia - mild.  Should improve with lasix.   REC -  Hold off on further thoracentesis for now given much improved resp status clinically  Await fluid studies from thora including cytology  Empiric abx  F/u CXR  If effusion re accumulates may want to consider early pleur-x given high suspicion for malignant effusion  Oncology to see - need to discussion goals of care Consider palliative care involvement  Monitor renal function closely with diuresis  F/u chem  2D echo pending   Nickolas Madrid, NP 07/08/2015  10:52 AM Pager: [phone] or [phone]

## 2015-07-08 NOTE — Progress Notes (Signed)
Patient found on RA ([place] not in position). O2 saturations 100% at this time. [place] removed from patient and neb treatment being administered. RT will continue to monitor patient

## 2015-07-08 NOTE — Progress Notes (Signed)
PROGRESS NOTE  Wesley [name] GUR:[id number] DOB: July 23, 1945 DOA: 07/07/2015 PCP: [name], MD Brief History 70 year old male with a history of multiple myeloma followed by Dr. [name] who presently is scheduled to start chemotherapy (3rd line therapy due to disease progression) on with Carfilzomib, Pomalyst and Decadron presents with progressive dyspnea over the past 1-2 days. Patient also received 2 units PRBC prior to his chemotherapy on 07/03/2015. Workup in the emergency department showed bilateral pleural effusions on chest x-ray. CT of the chest, abdomen and pelvis on 07/07/2015 revealed a moderate left and large right pleural effusion. This was causing mass effect on the anterior heart border. Echocardiogram was ordered causes of concerns for hemodynamic effects. CT of the chest also revealed bulky lymphadenopathy/soft tissue mass between the chest and pericardium causing some mass effect. The patient had pre-existing chest wall masses and right seventh rib masses. The patient was also noted to have a left periaortic mass showing enlargement causing mass effect on the left kidney. In addition there was evidence of new mesenteric lymphadenopathy and peritoneal implants and bilateral, right greater than left inguinal adenopathy. Along the right lateral margin of the liver there is a lentiform 5 x 2.5 cm mass indenting liver contour suggesting capsular implant. The patient remains tachycardic, but hemodynamically stable presently. Assessment/Plan: Acute respiratory failure with hypoxia -Secondary to pleural effusion likely resulting from disease progression for myeloma -Status post right thoracocentesis--1600 mL -Supplemental oxygen -officially consult CCM -difficult situation-->?Pleurx vs continue lasix (worsening renal fxn) vs  thoracocentesis on Left -presently stable on 2L [place] Bilateral pleural effusions -Consult to pulmonology -Status post right  thoracocentesis--follow-up cytology -Likely malignant effusions Anterior mediastinal/retrosternal lymphadenopathy  -CT suggested causing mass effect on pericardium -consult radiation oncology  -Patient previously scheduled to have radiation to the skull -echo results pending Hyponatremia  -Likely multifactorial including acute on chronic renal failure and possible SIADH  -Urine osmolarity  -Serum osmolarity  -Uric acid  Hyperkalemia  -Expect improvement with furosemide IV  Acute on chronic renal failure (CKD stage 3) -In part due to diuresis  -Baseline creatinine 2.1-2.5 -Monitor closely with furosemide IV Hypertension  -Hold amlodipine  -Continue hydralazine  Hydronephrosis  -Right-sided hydronephrosis has actually improved  -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 [name] has been notified of the patient's admission  -Patient is scheduled to start third line chemotherapy     Family Communication:   No family at beside Disposition Plan:   Home--expect >3 day hospitalization      Procedures/Studies: Ct Abdomen Pelvis Wo Contrast  07/07/2015  CLINICAL DATA:  Shortness of breath for 2 weeks, also right groin pain with known hernia in that region, personal history of multiple myeloma and bladder cancer EXAM: CT CHEST, ABDOMEN AND PELVIS WITHOUT CONTRAST TECHNIQUE: Multidetector CT imaging of the chest, abdomen and pelvis was performed following the standard protocol without IV contrast. COMPARISON:  Numerous prior CT scans and radiographs FINDINGS: CT CHEST FINDINGS Mediastinum/Lymph Nodes: On this non contrasted study there is bulky nodularity in the retrosternal region of the superior mediastinum. This likely represents a combination of prominent venous structures +adenopathy. The heart is mildly enlarged, and there is mass effect on the heart anteriorly from bulky multinodular soft tissue density interposed between the anterior chest  wall in the anterior pericardium. This extends from left to right measuring 17 cm. This process demonstrates a thickness of 3.4 cm. Lungs/Pleura: There is a small to moderate left  pleural effusion and there is a large right pleural effusion. Minimal aerated right lung is involved with atelectasis. Aerated left lung is also involved with mild compressive atelectasis. The lungs are otherwise clear allowing for the presence of respiratory motion. Musculoskeletal: There are bilateral paramedian anterior chest wall masses just inferior to the sternomanubrial joint. These were not present previously. These measure about 2.5 cm in AP diameter maximally. This process continues inferiorly to blend with the soft tissue mass anterior to the heart described above. Mass along the right lateral seventh rib again identified, smaller than on the prior study, today measuring approximately 5.7 x 3.1 cm as compared to 6.5 x 3.8 cm previously. CT ABDOMEN PELVIS FINDINGS Hepatobiliary: No focal abnormalities given limited non contrasted evaluation. Gallbladder is negative. Pancreas: Pancreatic tail is draped anteriorly around an enlarged mass arising anteriorly off of the left kidney. On 03/20/2015 this mass measured 46 x 50 mm. It is markedly enlarged today measuring 78 x 104 mm. Spleen: No significant abnormalities Adrenals/Urinary Tract: Adrenal glands negative. Right kidney demonstrates mild hydronephrosis. Right hydronephrosis and hydroureter slightly less prominent when compared to 03/20/2015. There is severe diffuse bladder wall thickening, with a lobulated bladder mass again identified similar to prior studies. Stomach/Bowel: Nonobstructive gas pattern. New no focal bowel abnormalities. Vascular/Lymphatic: No evidence of aortic dilatation. There is adenopathy inferior to the aortic bifurcation measuring about 2.7 cm in short axis. There is extensive adenopathy in the bilateral inguinal regions right greater than left. Largest  right inguinal lymph node measures about 3.8 cm in short axis. Soft tissue material within the right inguinal canal appears consistent with adenopathy within inguinal hernia. No definite bowel involvement. There is peritoneal nodularity in the pericolic gutter on the left measuring 43 by 25 mm on image number 59. This was not present previously. Mesenteric adenopathy left lower quadrant also new or increased with the largest lymph node measuring 15 mm. Along the right lateral margin of the liver there is a lentiform 5 x 2.5 cm mass indenting liver contour suggesting capsular implant. Reproductive: Bilateral inguinal hernias containing fat. Additionally, as described above, soft tissue nodules are seen within the right inguinal hernia suggesting adenopathy. Other: There is trace ascites present in the abdomen and pelvis Musculoskeletal: No evidence of vertebra plana. No acute musculoskeletal findings. IMPRESSION: 1. Small to moderate left and large right pleural effusion. Proportional bilateral volume loss as a result. These are new from 03/20/2015. 2. Mass effect on the anterior heart border may be hemodynamically significant. The jugular veins appear more prominent than they did previously. 3. Bulky multinodular soft tissue mass interposed between the anterior chest wall and anterior pericardium causing mass effect, likely reflecting extensive mediastinal adenopathy. There is similar fullness in the superior mediastinum which likely reflects a combination of venous engorgement and adenopathy. 4. Right chest wall mass persists but is smaller when compared to prior study. 5. Market enlargement of left periaortic mass inseparable from left kidney. This causes significant mass effect upon the left kidney as well as on the tail of the pancreas and stomach. 6. Evidence of new mesenteric adenopathy and peritoneal implants as well as inguinal adenopathy. 7. Severe diffuse bladder wall thickening. Lobulated inferior  posterior bladder mass again identified. 8. Moderate right hydroureteronephrosis slightly less pronounced when compared to the prior study. Electronically Signed   By: Skipper Cliche M.D.   On: 07/07/2015 10:33   Dg Chest 1 View  07/08/2015  CLINICAL DATA:  Post right thoracentesis. History of right pulmonary nodule,  bladder tumor, multiple myeloma. EXAM: CHEST 1 VIEW COMPARISON:  07/07/2015 FINDINGS: Shallow inspiration. Bilateral pleural effusions again demonstrated. Left pleural effusion appears increased since previous study, possibly due to shallow inspiration. Mild cardiac enlargement. Atelectasis in the lung bases. No pneumothorax. Power port type central venous catheter with tip over the low SVC region. IMPRESSION: Bilateral pleural effusions and basilar atelectasis, possibly increasing on the left. No pneumothorax. Electronically Signed   By: Lucienne Capers M.D.   On: 07/08/2015 00:32   Dg Chest 2 View  07/07/2015  CLINICAL DATA:  Shortness of breath for 2 weeks with some anterior left chest pain, history of multiple myeloma and bladder cancer EXAM: CHEST  2 VIEW COMPARISON:  06/26/2015 FINDINGS: Mildly enlarged cardiac silhouette. Elevated right diaphragm. Bibasilar opacities suggesting a combination of airspace disease and pleural effusion. Stable right Port-A-Cath. IMPRESSION: Bilateral lower lobe opacity right worse than left suggesting a combination of pleural effusion and consolidation. Electronically Signed   By: Skipper Cliche M.D.   On: 07/07/2015 09:05   Dg Chest 2 View  06/26/2015  CLINICAL DATA:  Shortness of breath and cough.  Sharp chest pain. EXAM: CHEST  2 VIEW COMPARISON:  03/20/2015 and CT scan of the chest dated 11/23/2014 FINDINGS: There are new small bilateral pleural effusions. The nodule at the left lung base posteriorly has increased in size and measures 13 mm in diameter. This was approximately 9 mm in size on the prior CT scan. There is suggestion of a new 13 mm nodule  at the right lung base laterally adjacent to the mass destroying the lateral aspect of the right seventh rib. That mass appears essentially unchanged. No new bone lesions. There is slight cardiomegaly. Pulmonary vascularity is normal. Power port appears in good position. IMPRESSION: 1. New small bilateral pleural effusions. 2. Slight increase in size of the pulmonary nodule at the left lung base posteriorly. 3. Possible new nodule at the right lung base laterally. 4. No significant change in the mass destroying the lateral aspect of the right seventh rib. Electronically Signed   By: Lorriane Shire M.D.   On: 06/26/2015 12:51   Ct Chest Wo Contrast  07/07/2015  CLINICAL DATA:  Shortness of breath for 2 weeks, also right groin pain with known hernia in that region, personal history of multiple myeloma and bladder cancer EXAM: CT CHEST, ABDOMEN AND PELVIS WITHOUT CONTRAST TECHNIQUE: Multidetector CT imaging of the chest, abdomen and pelvis was performed following the standard protocol without IV contrast. COMPARISON:  Numerous prior CT scans and radiographs FINDINGS: CT CHEST FINDINGS Mediastinum/Lymph Nodes: On this non contrasted study there is bulky nodularity in the retrosternal region of the superior mediastinum. This likely represents a combination of prominent venous structures +adenopathy. The heart is mildly enlarged, and there is mass effect on the heart anteriorly from bulky multinodular soft tissue density interposed between the anterior chest wall in the anterior pericardium. This extends from left to right measuring 17 cm. This process demonstrates a thickness of 3.4 cm. Lungs/Pleura: There is a small to moderate left pleural effusion and there is a large right pleural effusion. Minimal aerated right lung is involved with atelectasis. Aerated left lung is also involved with mild compressive atelectasis. The lungs are otherwise clear allowing for the presence of respiratory motion. Musculoskeletal: There  are bilateral paramedian anterior chest wall masses just inferior to the sternomanubrial joint. These were not present previously. These measure about 2.5 cm in AP diameter maximally. This process continues inferiorly to blend with  the soft tissue mass anterior to the heart described above. Mass along the right lateral seventh rib again identified, smaller than on the prior study, today measuring approximately 5.7 x 3.1 cm as compared to 6.5 x 3.8 cm previously. CT ABDOMEN PELVIS FINDINGS Hepatobiliary: No focal abnormalities given limited non contrasted evaluation. Gallbladder is negative. Pancreas: Pancreatic tail is draped anteriorly around an enlarged mass arising anteriorly off of the left kidney. On 03/20/2015 this mass measured 46 x 50 mm. It is markedly enlarged today measuring 78 x 104 mm. Spleen: No significant abnormalities Adrenals/Urinary Tract: Adrenal glands negative. Right kidney demonstrates mild hydronephrosis. Right hydronephrosis and hydroureter slightly less prominent when compared to 03/20/2015. There is severe diffuse bladder wall thickening, with a lobulated bladder mass again identified similar to prior studies. Stomach/Bowel: Nonobstructive gas pattern. New no focal bowel abnormalities. Vascular/Lymphatic: No evidence of aortic dilatation. There is adenopathy inferior to the aortic bifurcation measuring about 2.7 cm in short axis. There is extensive adenopathy in the bilateral inguinal regions right greater than left. Largest right inguinal lymph node measures about 3.8 cm in short axis. Soft tissue material within the right inguinal canal appears consistent with adenopathy within inguinal hernia. No definite bowel involvement. There is peritoneal nodularity in the pericolic gutter on the left measuring 43 by 25 mm on image number 59. This was not present previously. Mesenteric adenopathy left lower quadrant also new or increased with the largest lymph node measuring 15 mm. Along the right  lateral margin of the liver there is a lentiform 5 x 2.5 cm mass indenting liver contour suggesting capsular implant. Reproductive: Bilateral inguinal hernias containing fat. Additionally, as described above, soft tissue nodules are seen within the right inguinal hernia suggesting adenopathy. Other: There is trace ascites present in the abdomen and pelvis Musculoskeletal: No evidence of vertebra plana. No acute musculoskeletal findings. IMPRESSION: 1. Small to moderate left and large right pleural effusion. Proportional bilateral volume loss as a result. These are new from 03/20/2015. 2. Mass effect on the anterior heart border may be hemodynamically significant. The jugular veins appear more prominent than they did previously. 3. Bulky multinodular soft tissue mass interposed between the anterior chest wall and anterior pericardium causing mass effect, likely reflecting extensive mediastinal adenopathy. There is similar fullness in the superior mediastinum which likely reflects a combination of venous engorgement and adenopathy. 4. Right chest wall mass persists but is smaller when compared to prior study. 5. Market enlargement of left periaortic mass inseparable from left kidney. This causes significant mass effect upon the left kidney as well as on the tail of the pancreas and stomach. 6. Evidence of new mesenteric adenopathy and peritoneal implants as well as inguinal adenopathy. 7. Severe diffuse bladder wall thickening. Lobulated inferior posterior bladder mass again identified. 8. Moderate right hydroureteronephrosis slightly less pronounced when compared to the prior study. Electronically Signed   By: Skipper Cliche M.D.   On: 07/07/2015 10:33   Nm Pulmonary Perf And Vent  06/26/2015  CLINICAL DATA:  Sudden onset chest pain this afternoon with shortness of breath. EXAM: NUCLEAR MEDICINE VENTILATION - PERFUSION LUNG SCAN TECHNIQUE: Ventilation images were obtained in multiple projections using inhaled  aerosol Tc-[id number]. Perfusion images were obtained in multiple projections after intravenous injection of Tc-987mAA. RADIOPHARMACEUTICALS:  33.3 Technetium-9945mPA aerosol inhalation and 4.3 Technetium-78m24m IV COMPARISON:  Chest x-ray from today. FINDINGS: Ventilation: No focal ventilation defect. Perfusion: No wedge shaped peripheral perfusion defects to suggest acute pulmonary embolism. IMPRESSION: Normal bilateral  lung perfusion. Electronically Signed   By: Kennith Center M.D.   On: 06/26/2015 19:57   Ir Fluoro Guide Cv Line Right  06/10/2015  CLINICAL DATA:  Multiple myeloma and need for porta cath for continued therapy. EXAM: IMPLANTED PORT A CATH PLACEMENT WITH ULTRASOUND AND FLUOROSCOPIC GUIDANCE ANESTHESIA/SEDATION: 3.0 mg IV Versed; 50 mcg IV Fentanyl Total Moderate Sedation Time:  37 minutes The patient's level of consciousness and physiologic status were continuously monitored during the procedure by Radiology nursing. Additional Medications: 2 g IV Ancef. As antibiotic prophylaxis, Ancef was ordered pre-procedure and administered intravenously within one hour of incision. FLUOROSCOPY TIME:  30 seconds. PROCEDURE: The procedure, risks, benefits, and alternatives were explained to the patient. Questions regarding the procedure were encouraged and answered. The patient understands and consents to the procedure. A time-out was performed prior to the procedure. The right neck and chest were prepped with chlorhexidine in a sterile fashion, and a sterile drape was applied covering the operative field. Maximum barrier sterile technique with sterile gowns and gloves were used for the procedure. Local anesthesia was provided with 1% lidocaine. Ultrasound was used to confirm patency of the right internal jugular vein After creating a small venotomy incision, a 21 gauge needle was advanced into the right internal jugular vein under direct, real-time ultrasound guidance. Ultrasound image documentation was  performed. After securing guidewire access, an 8 Fr dilator was placed. A J-wire was kinked to measure appropriate catheter length. A subcutaneous port pocket was then created along the upper chest wall utilizing sharp and blunt dissection. Portable cautery was utilized. The pocket was irrigated with sterile saline. A single lumen power injectable port was chosen for placement. The 8 Fr catheter was tunneled from the port pocket site to the venotomy incision. The port was placed in the pocket. External catheter was trimmed to appropriate length based on guidewire measurement. At the venotomy, an 8 Fr peel-away sheath was placed over a guidewire. The catheter was then placed through the sheath and the sheath removed. Final catheter positioning was confirmed and documented with a fluoroscopic spot image. The port was accessed with a needle and aspirated and flushed with heparinized saline. The needle was removed. The venotomy and port pocket incisions were closed with subcutaneous 3-0 Monocryl and subcuticular 4-0 Vicryl. Dermabond was applied to both incisions. COMPLICATIONS: None FINDINGS: After catheter placement, the tip lies at the cavoatrial junction. The catheter aspirates normally and is ready for immediate use. IMPRESSION: Placement of single lumen port a cath via right internal jugular vein. The catheter tip lies at the cavoatrial junction. A power injectable port a cath was placed and is ready for immediate use. Electronically Signed   By: Irish Lack M.D.   On: 06/10/2015 10:37   Ir US Guide Vasc Access Right  06/10/2015  CLINICAL DATA:  Multiple myeloma and need for porta cath for continued therapy. EXAM: IMPLANTED PORT A CATH PLACEMENT WITH ULTRASOUND AND FLUOROSCOPIC GUIDANCE ANESTHESIA/SEDATION: 3.0 mg IV Versed; 50 mcg IV Fentanyl Total Moderate Sedation Time:  37 minutes The patient's level of consciousness and physiologic status were continuously monitored during the procedure by Radiology  nursing. Additional Medications: 2 g IV Ancef. As antibiotic prophylaxis, Ancef was ordered pre-procedure and administered intravenously within one hour of incision. FLUOROSCOPY TIME:  30 seconds. PROCEDURE: The procedure, risks, benefits, and alternatives were explained to the patient. Questions regarding the procedure were encouraged and answered. The patient understands and consents to the procedure. A time-out was performed prior to the  procedure. The right neck and chest were prepped with chlorhexidine in a sterile fashion, and a sterile drape was applied covering the operative field. Maximum barrier sterile technique with sterile gowns and gloves were used for the procedure. Local anesthesia was provided with 1% lidocaine. Ultrasound was used to confirm patency of the right internal jugular vein After creating a small venotomy incision, a 21 gauge needle was advanced into the right internal jugular vein under direct, real-time ultrasound guidance. Ultrasound image documentation was performed. After securing guidewire access, an 8 Fr dilator was placed. A J-wire was kinked to measure appropriate catheter length. A subcutaneous port pocket was then created along the upper chest wall utilizing sharp and blunt dissection. Portable cautery was utilized. The pocket was irrigated with sterile saline. A single lumen power injectable port was chosen for placement. The 8 Fr catheter was tunneled from the port pocket site to the venotomy incision. The port was placed in the pocket. External catheter was trimmed to appropriate length based on guidewire measurement. At the venotomy, an 8 Fr peel-away sheath was placed over a guidewire. The catheter was then placed through the sheath and the sheath removed. Final catheter positioning was confirmed and documented with a fluoroscopic spot image. The port was accessed with a needle and aspirated and flushed with heparinized saline. The needle was removed. The venotomy and port  pocket incisions were closed with subcutaneous 3-0 Monocryl and subcuticular 4-0 Vicryl. Dermabond was applied to both incisions. COMPLICATIONS: None FINDINGS: After catheter placement, the tip lies at the cavoatrial junction. The catheter aspirates normally and is ready for immediate use. IMPRESSION: Placement of single lumen port a cath via right internal jugular vein. The catheter tip lies at the cavoatrial junction. A power injectable port a cath was placed and is ready for immediate use. Electronically Signed   By: Aletta Edouard M.D.   On: 06/10/2015 10:37         Subjective:  patient is breathing better after thoracocentesis. He denies any headache, fevers, chills, chest pain, nausea, vomiting, diarrhea, vomiting. No dysuria or hematuria. No rashes.  Objective: Filed Vitals:   07/08/15 0423 07/08/15 0500 07/08/15 0541 07/08/15 0600  BP:  121/84 137/98 138/86  Pulse:  109  110  Temp: 98.6 F (37 C)     TempSrc: Oral     Resp:  11  12  Weight: 88.4 kg (194 lb 14.2 oz)     SpO2:  100%  100%    Intake/Output Summary (Last 24 hours) at 07/08/15 0738 Last data filed at 07/08/15 0616  Gross per 24 hour  Intake     62 ml  Output   2750 ml  Net  -2688 ml   Weight change:  Exam:   General:  Pt is alert, follows commands appropriately, not in acute distress  HEENT: No icterus, No thrush, No neck mass, [place]/AT  Cardiovascular: RRR, S1/S2, no rubs, no gallops  Respiratory: Diminished breath sound bilateral. No wheezing. Good air movement.   Abdomen: Soft/+BS, non tender, non distended, no guarding; no hepatosplenomegaly   Extremities: No edema, No lymphangitis, No petechiae, No rashes, no synovitis  Data Reviewed: Basic Metabolic Panel:  Recent Labs Lab 07/03/15 0936 07/07/15 0910 07/07/15 1349 07/08/15 0400  NA 131* 126* 123* 122*  K 4.8 5.2* 5.5* 5.4*  CL  --  98* 95* 94*  CO2 20* 19* 17* 19*  GLUCOSE 150* 111* 134* 130*  BUN 20.'8 17 17 '$ 23*  CREATININE 2.7*  2.52* 2.39*  2.81*  CALCIUM 9.1 9.1 8.9 8.9   Liver Function Tests:  Recent Labs Lab 07/03/15 0936 07/07/15 0910  AST 14 20  ALT 10 13*  ALKPHOS 44 40  BILITOT 1.04 1.0  PROT 8.9* 9.1*  ALBUMIN 3.3* 3.7   No results for input(s): LIPASE, AMYLASE in the last 168 hours. No results for input(s): AMMONIA in the last 168 hours. CBC:  Recent Labs Lab 07/03/15 0936 07/07/15 0910 07/07/15 1349 07/08/15 0400  WBC 4.9 4.0 3.3* 6.2  NEUTROABS 3.1 2.6  --   --   HGB 7.5* 9.2* 9.4* 8.9*  HCT 22.9* 27.9* 28.1* 26.9*  MCV 99.9* 95.2 95.3 93.4  PLT 223 234 196 202   Cardiac Enzymes: No results for input(s): CKTOTAL, CKMB, CKMBINDEX, TROPONINI in the last 168 hours. BNP: Invalid input(s): POCBNP CBG: No results for input(s): GLUCAP in the last 168 hours.  Recent Results (from the past 240 hour(s))  Culture, blood (Routine X 2) w Reflex to ID Panel     Status: None (Preliminary result)   Collection Time: 07/07/15  9:31 AM  Result Value Ref Range Status   Specimen Description BLOOD RIGHT CHEST  Final   Special Requests BOTTLES DRAWN AEROBIC AND ANAEROBIC 5 CC  Final   Culture PENDING  Incomplete   Report Status PENDING  Incomplete  Culture, blood (Routine X 2) w Reflex to ID Panel     Status: None (Preliminary result)   Collection Time: 07/07/15  9:41 AM  Result Value Ref Range Status   Specimen Description BLOOD RIGHT ANTECUBITAL  Final   Special Requests BOTTLES DRAWN AEROBIC AND ANAEROBIC 5 CC  Final   Culture PENDING  Incomplete   Report Status PENDING  Incomplete  MRSA PCR Screening     Status: None   Collection Time: 07/07/15  1:09 PM  Result Value Ref Range Status   MRSA by PCR NEGATIVE NEGATIVE Final    Comment:        The GeneXpert MRSA Assay (FDA approved for NASAL specimens only), is one component of a comprehensive MRSA colonization surveillance program. It is not intended to diagnose MRSA infection nor to guide or monitor treatment for MRSA infections.        Scheduled Meds: . azithromycin  500 mg Intravenous Q24H  . cefTRIAXone (ROCEPHIN)  IV  1 g Intravenous Q24H  . dexamethasone  4 mg Intravenous 4 times per day  . finasteride  5 mg Oral Daily  . furosemide  120 mg Intravenous Q12H  . hydrALAZINE  50 mg Oral 3 times per day  . ipratropium-albuterol  3 mL Nebulization TID  . sodium chloride flush  3 mL Intravenous Q12H  . tamsulosin  0.4 mg Oral BID   Continuous Infusions:    Ezelle Surprenant, DO  [facility] Pager [phone]  If 7PM-7AM, please contact night-coverage [email or web address] Password [email or web address] 07/08/2015, 7:38 AM   LOS: 1 day

## 2015-07-08 NOTE — Progress Notes (Signed)
Radiation Oncology         [phone] ________________________________  Name: Wesley [name] MRN: [id number]  Date: 07/08/2015  DOB: 1945-05-10  Inpatient re-evaluation Note  CC: [name], MD  [name], MD    ICD-9-CM ICD-10-CM   1. Kappa light chain myeloma (HCC) 203.00 C90.00     Diagnosis:   Multiple myeloma  Interval Since Last Radiation:  557  days,  the patient completed radiation treatments to a right anterior chest wall mass, poorly differentiated  Narrative:  The patient is seen today as an inpatient. He was seen by Dr. [name] recently was scheduled to start on new therapy  (  Carfilzomib 20 MG/M2 initially first week followed by 36 MG/M2 on days 1, 2, 8, 9, 15 and 16 every 4 weeks in addition to Pomalyst 3 mg by mouth daily for 21 days every 3 weeks as well as Decadron 40 mg on a weekly basis) later this month for his myeloma. Patient was noted to have a left parasternal mass and subsequent CT scans were performed and showed significant progression of his disease. The patient was also complaining of shortness of breath And was subsequently admitted to the hospital. Patient was noted to have an enlarging right sided pleural effusion and underwent the thoracentesis yesterday with 1.6 L of cloudy fluid removed. This did improve the patient's breathing.  Pathology from this procedure is pending at this time. If it is malignant and the patient will likely have a Pleurx catheter placed. In light of the anterior chest wall mass with potential compromise of vascular structures, radiation therapy is been consulted for consideration for treatment.              ALLERGIES:  has No Known Allergies.  Meds: No current facility-administered medications for this encounter.   No current outpatient prescriptions on file.   Facility-Administered Medications Ordered in Other Encounters  Medication Dose Route Frequency Provider Last Rate Last Dose  . azithromycin (ZITHROMAX)  500 mg in dextrose 5 % 250 mL IVPB  500 mg Intravenous Q24H Cindie Laroche Wofford, RPH   500 mg at 07/08/15 1007  . cefTRIAXone (ROCEPHIN) 1 g in dextrose 5 % 50 mL IVPB  1 g Intravenous Q24H Polly Cobia, RPH   1 g at 07/08/15 0929  . dexamethasone (DECADRON) injection 4 mg  4 mg Intravenous 4 times per day Velna Hatchet, MD   4 mg at 07/08/15 1720  . finasteride (PROSCAR) tablet 5 mg  5 mg Oral Daily Velna Hatchet, MD   5 mg at 07/08/15 0865  . furosemide (LASIX) 120 mg in dextrose 5 % 50 mL IVPB  120 mg Intravenous Q12H Kara Mead V, MD   120 mg at 07/08/15 1720  . hydrALAZINE (APRESOLINE) injection 5 mg  5 mg Intravenous Q4H PRN Theodis Blaze, MD      . hydrALAZINE (APRESOLINE) tablet 50 mg  50 mg Oral 3 times per day Velna Hatchet, MD   50 mg at 07/08/15 1502  . HYDROcodone-acetaminophen (NORCO/VICODIN) 5-325 MG per tablet 1-2 tablet  1-2 tablet Oral Q4H PRN Velna Hatchet, MD      . ipratropium-albuterol (DUONEB) 0.5-2.5 (3) MG/3ML nebulizer solution 3 mL  3 mL Nebulization Q2H PRN Theodis Blaze, MD      . ipratropium-albuterol (DUONEB) 0.5-2.5 (3) MG/3ML nebulizer solution 3 mL  3 mL Nebulization TID Velna Hatchet, MD   3 mL at 07/08/15 1532  . ondansetron (ZOFRAN) tablet 4 mg  4 mg  Oral Q6H PRN Velna Hatchet, MD       Or  . ondansetron (ZOFRAN) injection 4 mg  4 mg Intravenous Q6H PRN Velna Hatchet, MD      . polyethylene glycol (MIRALAX / GLYCOLAX) packet 17 g  17 g Oral Daily PRN Velna Hatchet, MD      . prochlorperazine (COMPAZINE) tablet 10 mg  10 mg Oral Q6H PRN Velna Hatchet, MD      . sodium chloride flush (NS) 0.9 % injection 3 mL  3 mL Intravenous Q12H Velna Hatchet, MD   3 mL at 07/08/15 1000  . tamsulosin (FLOMAX) capsule 0.4 mg  0.4 mg Oral BID Velna Hatchet, MD   0.4 mg at 07/08/15 8502    Physical Findings: The patient is in no acute distress. Patient is alert and oriented. He is accompanied by several family members this evening. Patient is lying in his hospital had.  he does complain of some pain along the right groin with standing and walking. He admits to a left sternal mass but has minimal pain concerning this issue. Patient is noted to have a soft tissue mass along the left frontal area which does not cause pain for the patient. Examination of the neck reveals questionable left supraclavicular adenopathy. Lungs sounds are mildly diminished in the right lower lung field. The heart has a regular rhythm and rate. The patient has a firm mass along the left upper parasternal area consistent with myeloma, nontender to palpation. Patient also has a firm mass in the right groin approximately 3-4 cm consistent with malignancy   Lab Findings: Lab Results  Component Value Date   WBC 6.2 07/08/2015   HGB 8.9* 07/08/2015   HCT 26.9* 07/08/2015   MCV 93.4 07/08/2015   PLT 202 07/08/2015    Radiographic Findings: Ct Abdomen Pelvis Wo Contrast  07/07/2015  CLINICAL DATA:  Shortness of breath for 2 weeks, also right groin pain with known hernia in that region, personal history of multiple myeloma and bladder cancer EXAM: CT CHEST, ABDOMEN AND PELVIS WITHOUT CONTRAST TECHNIQUE: Multidetector CT imaging of the chest, abdomen and pelvis was performed following the standard protocol without IV contrast. COMPARISON:  Numerous prior CT scans and radiographs FINDINGS: CT CHEST FINDINGS Mediastinum/Lymph Nodes: On this non contrasted study there is bulky nodularity in the retrosternal region of the superior mediastinum. This likely represents a combination of prominent venous structures +adenopathy. The heart is mildly enlarged, and there is mass effect on the heart anteriorly from bulky multinodular soft tissue density interposed between the anterior chest wall in the anterior pericardium. This extends from left to right measuring 17 cm. This process demonstrates a thickness of 3.4 cm. Lungs/Pleura: There is a small to moderate left pleural effusion and there is a large right pleural  effusion. Minimal aerated right lung is involved with atelectasis. Aerated left lung is also involved with mild compressive atelectasis. The lungs are otherwise clear allowing for the presence of respiratory motion. Musculoskeletal: There are bilateral paramedian anterior chest wall masses just inferior to the sternomanubrial joint. These were not present previously. These measure about 2.5 cm in AP diameter maximally. This process continues inferiorly to blend with the soft tissue mass anterior to the heart described above. Mass along the right lateral seventh rib again identified, smaller than on the prior study, today measuring approximately 5.7 x 3.1 cm as compared to 6.5 x 3.8 cm previously. CT ABDOMEN PELVIS FINDINGS Hepatobiliary: No focal abnormalities given limited non contrasted evaluation. Gallbladder is  negative. Pancreas: Pancreatic tail is draped anteriorly around an enlarged mass arising anteriorly off of the left kidney. On 03/20/2015 this mass measured 46 x 50 mm. It is markedly enlarged today measuring 78 x 104 mm. Spleen: No significant abnormalities Adrenals/Urinary Tract: Adrenal glands negative. Right kidney demonstrates mild hydronephrosis. Right hydronephrosis and hydroureter slightly less prominent when compared to 03/20/2015. There is severe diffuse bladder wall thickening, with a lobulated bladder mass again identified similar to prior studies. Stomach/Bowel: Nonobstructive gas pattern. New no focal bowel abnormalities. Vascular/Lymphatic: No evidence of aortic dilatation. There is adenopathy inferior to the aortic bifurcation measuring about 2.7 cm in short axis. There is extensive adenopathy in the bilateral inguinal regions right greater than left. Largest right inguinal lymph node measures about 3.8 cm in short axis. Soft tissue material within the right inguinal canal appears consistent with adenopathy within inguinal hernia. No definite bowel involvement. There is peritoneal  nodularity in the pericolic gutter on the left measuring 43 by 25 mm on image number 59. This was not present previously. Mesenteric adenopathy left lower quadrant also new or increased with the largest lymph node measuring 15 mm. Along the right lateral margin of the liver there is a lentiform 5 x 2.5 cm mass indenting liver contour suggesting capsular implant. Reproductive: Bilateral inguinal hernias containing fat. Additionally, as described above, soft tissue nodules are seen within the right inguinal hernia suggesting adenopathy. Other: There is trace ascites present in the abdomen and pelvis Musculoskeletal: No evidence of vertebra plana. No acute musculoskeletal findings. IMPRESSION: 1. Small to moderate left and large right pleural effusion. Proportional bilateral volume loss as a result. These are new from 03/20/2015. 2. Mass effect on the anterior heart border may be hemodynamically significant. The jugular veins appear more prominent than they did previously. 3. Bulky multinodular soft tissue mass interposed between the anterior chest wall and anterior pericardium causing mass effect, likely reflecting extensive mediastinal adenopathy. There is similar fullness in the superior mediastinum which likely reflects a combination of venous engorgement and adenopathy. 4. Right chest wall mass persists but is smaller when compared to prior study. 5. Market enlargement of left periaortic mass inseparable from left kidney. This causes significant mass effect upon the left kidney as well as on the tail of the pancreas and stomach. 6. Evidence of new mesenteric adenopathy and peritoneal implants as well as inguinal adenopathy. 7. Severe diffuse bladder wall thickening. Lobulated inferior posterior bladder mass again identified. 8. Moderate right hydroureteronephrosis slightly less pronounced when compared to the prior study. Electronically Signed   By: Skipper Cliche M.D.   On: 07/07/2015 10:33   Dg Chest 1  View  07/08/2015  CLINICAL DATA:  Post right thoracentesis. History of right pulmonary nodule, bladder tumor, multiple myeloma. EXAM: CHEST 1 VIEW COMPARISON:  07/07/2015 FINDINGS: Shallow inspiration. Bilateral pleural effusions again demonstrated. Left pleural effusion appears increased since previous study, possibly due to shallow inspiration. Mild cardiac enlargement. Atelectasis in the lung bases. No pneumothorax. Power port type central venous catheter with tip over the low SVC region. IMPRESSION: Bilateral pleural effusions and basilar atelectasis, possibly increasing on the left. No pneumothorax. Electronically Signed   By: Lucienne Capers M.D.   On: 07/08/2015 00:32   Dg Chest 2 View  07/07/2015  CLINICAL DATA:  Shortness of breath for 2 weeks with some anterior left chest pain, history of multiple myeloma and bladder cancer EXAM: CHEST  2 VIEW COMPARISON:  06/26/2015 FINDINGS: Mildly enlarged cardiac silhouette. Elevated right diaphragm. Bibasilar  opacities suggesting a combination of airspace disease and pleural effusion. Stable right Port-A-Cath. IMPRESSION: Bilateral lower lobe opacity right worse than left suggesting a combination of pleural effusion and consolidation. Electronically Signed   By: Skipper Cliche M.D.   On: 07/07/2015 09:05   Dg Chest 2 View  06/26/2015  CLINICAL DATA:  Shortness of breath and cough.  Sharp chest pain. EXAM: CHEST  2 VIEW COMPARISON:  03/20/2015 and CT scan of the chest dated 11/23/2014 FINDINGS: There are new small bilateral pleural effusions. The nodule at the left lung base posteriorly has increased in size and measures 13 mm in diameter. This was approximately 9 mm in size on the prior CT scan. There is suggestion of a new 13 mm nodule at the right lung base laterally adjacent to the mass destroying the lateral aspect of the right seventh rib. That mass appears essentially unchanged. No new bone lesions. There is slight cardiomegaly. Pulmonary vascularity is  normal. Power port appears in good position. IMPRESSION: 1. New small bilateral pleural effusions. 2. Slight increase in size of the pulmonary nodule at the left lung base posteriorly. 3. Possible new nodule at the right lung base laterally. 4. No significant change in the mass destroying the lateral aspect of the right seventh rib. Electronically Signed   By: Lorriane Shire M.D.   On: 06/26/2015 12:51   Ct Chest Wo Contrast  07/07/2015  CLINICAL DATA:  Shortness of breath for 2 weeks, also right groin pain with known hernia in that region, personal history of multiple myeloma and bladder cancer EXAM: CT CHEST, ABDOMEN AND PELVIS WITHOUT CONTRAST TECHNIQUE: Multidetector CT imaging of the chest, abdomen and pelvis was performed following the standard protocol without IV contrast. COMPARISON:  Numerous prior CT scans and radiographs FINDINGS: CT CHEST FINDINGS Mediastinum/Lymph Nodes: On this non contrasted study there is bulky nodularity in the retrosternal region of the superior mediastinum. This likely represents a combination of prominent venous structures +adenopathy. The heart is mildly enlarged, and there is mass effect on the heart anteriorly from bulky multinodular soft tissue density interposed between the anterior chest wall in the anterior pericardium. This extends from left to right measuring 17 cm. This process demonstrates a thickness of 3.4 cm. Lungs/Pleura: There is a small to moderate left pleural effusion and there is a large right pleural effusion. Minimal aerated right lung is involved with atelectasis. Aerated left lung is also involved with mild compressive atelectasis. The lungs are otherwise clear allowing for the presence of respiratory motion. Musculoskeletal: There are bilateral paramedian anterior chest wall masses just inferior to the sternomanubrial joint. These were not present previously. These measure about 2.5 cm in AP diameter maximally. This process continues inferiorly to blend  with the soft tissue mass anterior to the heart described above. Mass along the right lateral seventh rib again identified, smaller than on the prior study, today measuring approximately 5.7 x 3.1 cm as compared to 6.5 x 3.8 cm previously. CT ABDOMEN PELVIS FINDINGS Hepatobiliary: No focal abnormalities given limited non contrasted evaluation. Gallbladder is negative. Pancreas: Pancreatic tail is draped anteriorly around an enlarged mass arising anteriorly off of the left kidney. On 03/20/2015 this mass measured 46 x 50 mm. It is markedly enlarged today measuring 78 x 104 mm. Spleen: No significant abnormalities Adrenals/Urinary Tract: Adrenal glands negative. Right kidney demonstrates mild hydronephrosis. Right hydronephrosis and hydroureter slightly less prominent when compared to 03/20/2015. There is severe diffuse bladder wall thickening, with a lobulated bladder mass again identified  similar to prior studies. Stomach/Bowel: Nonobstructive gas pattern. New no focal bowel abnormalities. Vascular/Lymphatic: No evidence of aortic dilatation. There is adenopathy inferior to the aortic bifurcation measuring about 2.7 cm in short axis. There is extensive adenopathy in the bilateral inguinal regions right greater than left. Largest right inguinal lymph node measures about 3.8 cm in short axis. Soft tissue material within the right inguinal canal appears consistent with adenopathy within inguinal hernia. No definite bowel involvement. There is peritoneal nodularity in the pericolic gutter on the left measuring 43 by 25 mm on image number 59. This was not present previously. Mesenteric adenopathy left lower quadrant also new or increased with the largest lymph node measuring 15 mm. Along the right lateral margin of the liver there is a lentiform 5 x 2.5 cm mass indenting liver contour suggesting capsular implant. Reproductive: Bilateral inguinal hernias containing fat. Additionally, as described above, soft tissue  nodules are seen within the right inguinal hernia suggesting adenopathy. Other: There is trace ascites present in the abdomen and pelvis Musculoskeletal: No evidence of vertebra plana. No acute musculoskeletal findings. IMPRESSION: 1. Small to moderate left and large right pleural effusion. Proportional bilateral volume loss as a result. These are new from 03/20/2015. 2. Mass effect on the anterior heart border may be hemodynamically significant. The jugular veins appear more prominent than they did previously. 3. Bulky multinodular soft tissue mass interposed between the anterior chest wall and anterior pericardium causing mass effect, likely reflecting extensive mediastinal adenopathy. There is similar fullness in the superior mediastinum which likely reflects a combination of venous engorgement and adenopathy. 4. Right chest wall mass persists but is smaller when compared to prior study. 5. Market enlargement of left periaortic mass inseparable from left kidney. This causes significant mass effect upon the left kidney as well as on the tail of the pancreas and stomach. 6. Evidence of new mesenteric adenopathy and peritoneal implants as well as inguinal adenopathy. 7. Severe diffuse bladder wall thickening. Lobulated inferior posterior bladder mass again identified. 8. Moderate right hydroureteronephrosis slightly less pronounced when compared to the prior study. Electronically Signed   By: Skipper Cliche M.D.   On: 07/07/2015 10:33   Nm Pulmonary Perf And Vent  06/26/2015  CLINICAL DATA:  Sudden onset chest pain this afternoon with shortness of breath. EXAM: NUCLEAR MEDICINE VENTILATION - PERFUSION LUNG SCAN TECHNIQUE: Ventilation images were obtained in multiple projections using inhaled aerosol [id number]. Perfusion images were obtained in multiple projections after intravenous injection of Tc-952mAA. RADIOPHARMACEUTICALS:  33.3 Technetium-9966mPA aerosol inhalation and 4.3 Technetium-32m8m IV  COMPARISON:  Chest x-ray from today. FINDINGS: Ventilation: No focal ventilation defect. Perfusion: No wedge shaped peripheral perfusion defects to suggest acute pulmonary embolism. IMPRESSION: Normal bilateral lung perfusion. Electronically Signed   By: EricMisty Stanley.   On: 06/26/2015 19:57   Ir Fluoro Guide Cv Line Right  06/10/2015  CLINICAL DATA:  Multiple myeloma and need for porta cath for continued therapy. EXAM: IMPLANTED PORT A CATH PLACEMENT WITH ULTRASOUND AND FLUOROSCOPIC GUIDANCE ANESTHESIA/SEDATION: 3.0 mg IV Versed; 50 mcg IV Fentanyl Total Moderate Sedation Time:  37 minutes The patient's level of consciousness and physiologic status were continuously monitored during the procedure by Radiology nursing. Additional Medications: 2 g IV Ancef. As antibiotic prophylaxis, Ancef was ordered pre-procedure and administered intravenously within one hour of incision. FLUOROSCOPY TIME:  30 seconds. PROCEDURE: The procedure, risks, benefits, and alternatives were explained to the patient. Questions regarding the procedure were encouraged and answered. The  patient understands and consents to the procedure. A time-out was performed prior to the procedure. The right neck and chest were prepped with chlorhexidine in a sterile fashion, and a sterile drape was applied covering the operative field. Maximum barrier sterile technique with sterile gowns and gloves were used for the procedure. Local anesthesia was provided with 1% lidocaine. Ultrasound was used to confirm patency of the right internal jugular vein After creating a small venotomy incision, a 21 gauge needle was advanced into the right internal jugular vein under direct, real-time ultrasound guidance. Ultrasound image documentation was performed. After securing guidewire access, an 8 Fr dilator was placed. A J-wire was kinked to measure appropriate catheter length. A subcutaneous port pocket was then created along the upper chest wall utilizing sharp  and blunt dissection. Portable cautery was utilized. The pocket was irrigated with sterile saline. A single lumen power injectable port was chosen for placement. The 8 Fr catheter was tunneled from the port pocket site to the venotomy incision. The port was placed in the pocket. External catheter was trimmed to appropriate length based on guidewire measurement. At the venotomy, an 8 Fr peel-away sheath was placed over a guidewire. The catheter was then placed through the sheath and the sheath removed. Final catheter positioning was confirmed and documented with a fluoroscopic spot image. The port was accessed with a needle and aspirated and flushed with heparinized saline. The needle was removed. The venotomy and port pocket incisions were closed with subcutaneous 3-0 Monocryl and subcuticular 4-0 Vicryl. Dermabond was applied to both incisions. COMPLICATIONS: None FINDINGS: After catheter placement, the tip lies at the cavoatrial junction. The catheter aspirates normally and is ready for immediate use. IMPRESSION: Placement of single lumen port a cath via right internal jugular vein. The catheter tip lies at the cavoatrial junction. A power injectable port a cath was placed and is ready for immediate use. Electronically Signed   By: Aletta Edouard M.D.   On: 06/10/2015 10:37   Ir US Guide Vasc Access Right  06/10/2015  CLINICAL DATA:  Multiple myeloma and need for porta cath for continued therapy. EXAM: IMPLANTED PORT A CATH PLACEMENT WITH ULTRASOUND AND FLUOROSCOPIC GUIDANCE ANESTHESIA/SEDATION: 3.0 mg IV Versed; 50 mcg IV Fentanyl Total Moderate Sedation Time:  37 minutes The patient's level of consciousness and physiologic status were continuously monitored during the procedure by Radiology nursing. Additional Medications: 2 g IV Ancef. As antibiotic prophylaxis, Ancef was ordered pre-procedure and administered intravenously within one hour of incision. FLUOROSCOPY TIME:  30 seconds. PROCEDURE: The procedure,  risks, benefits, and alternatives were explained to the patient. Questions regarding the procedure were encouraged and answered. The patient understands and consents to the procedure. A time-out was performed prior to the procedure. The right neck and chest were prepped with chlorhexidine in a sterile fashion, and a sterile drape was applied covering the operative field. Maximum barrier sterile technique with sterile gowns and gloves were used for the procedure. Local anesthesia was provided with 1% lidocaine. Ultrasound was used to confirm patency of the right internal jugular vein After creating a small venotomy incision, a 21 gauge needle was advanced into the right internal jugular vein under direct, real-time ultrasound guidance. Ultrasound image documentation was performed. After securing guidewire access, an 8 Fr dilator was placed. A J-wire was kinked to measure appropriate catheter length. A subcutaneous port pocket was then created along the upper chest wall utilizing sharp and blunt dissection. Portable cautery was utilized. The pocket was irrigated with sterile  saline. A single lumen power injectable port was chosen for placement. The 8 Fr catheter was tunneled from the port pocket site to the venotomy incision. The port was placed in the pocket. External catheter was trimmed to appropriate length based on guidewire measurement. At the venotomy, an 8 Fr peel-away sheath was placed over a guidewire. The catheter was then placed through the sheath and the sheath removed. Final catheter positioning was confirmed and documented with a fluoroscopic spot image. The port was accessed with a needle and aspirated and flushed with heparinized saline. The needle was removed. The venotomy and port pocket incisions were closed with subcutaneous 3-0 Monocryl and subcuticular 4-0 Vicryl. Dermabond was applied to both incisions. COMPLICATIONS: None FINDINGS: After catheter placement, the tip lies at the cavoatrial  junction. The catheter aspirates normally and is ready for immediate use. IMPRESSION: Placement of single lumen port a cath via right internal jugular vein. The catheter tip lies at the cavoatrial junction. A power injectable port a cath was placed and is ready for immediate use. Electronically Signed   By: Aletta Edouard M.D.   On: 06/10/2015 10:37    Impression:  Progressive multiple myeloma. The patient has significant progressing within the chest with potential vascular compromise and pleural effusions. He may be a potential candidate for palliative treatments directed at the anterior chest region. Patient also has a large periaortic region which is causing significant mass effect upon the left kidney, pancreas and stomach area. 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  Plan:  Patient may be a candidate for palliative treatments directed at the anterior chest and potentially to his large left periaortic mass and right groin but will discuss this treatment with Dr. [name]. If he feels the patient may get a significant response to his upcoming new therapy then we may potentially hold off on radiation therapy plans.  ____________________________________ Gery Pray, MD

## 2015-07-09 ENCOUNTER — Inpatient Hospital Stay (HOSPITAL_COMMUNITY): Payer: Medicare HMO

## 2015-07-09 ENCOUNTER — Ambulatory Visit
Admit: 2015-07-09 | Discharge: 2015-07-09 | Disposition: A | Discharge status: Home / Self Care | Payer: Medicare HMO | Attending: Radiation Oncology | Admitting: Radiation Oncology

## 2015-07-09 ENCOUNTER — Telehealth: Payer: Self-pay | Admitting: Oncology

## 2015-07-09 DIAGNOSIS — C9000 Multiple myeloma not having achieved remission: Principal | ICD-10-CM

## 2015-07-09 DIAGNOSIS — R599 Enlarged lymph nodes, unspecified: Secondary | ICD-10-CM

## 2015-07-09 LAB — COMPREHENSIVE METABOLIC PANEL
ALT: 14 U/L — ABNORMAL LOW (ref 17–63)
ANION GAP: 10 (ref 5–15)
AST: 21 U/L (ref 15–41)
Albumin: 3.5 g/dL (ref 3.5–5.0)
Alkaline Phosphatase: 36 U/L — ABNORMAL LOW (ref 38–126)
BILIRUBIN TOTAL: 0.6 mg/dL (ref 0.3–1.2)
BUN: 34 mg/dL — AB (ref 6–20)
CO2: 22 mmol/L (ref 22–32)
Calcium: 9 mg/dL (ref 8.9–10.3)
Chloride: 94 mmol/L — ABNORMAL LOW (ref 101–111)
Creatinine, Ser: 2.79 mg/dL — ABNORMAL HIGH (ref 0.61–1.24)
GFR, EST AFRICAN AMERICAN: 25 mL/min — AB (ref >60)
GFR, EST NON AFRICAN AMERICAN: 22 mL/min — AB (ref >60)
Glucose, Bld: 130 mg/dL — ABNORMAL HIGH (ref 65–99)
POTASSIUM: 4.7 mmol/L (ref 3.5–5.1)
Sodium: 126 mmol/L — ABNORMAL LOW (ref 135–145)
TOTAL PROTEIN: 9 g/dL — AB (ref 6.5–8.1)

## 2015-07-09 LAB — CBC
HEMATOCRIT: 26 % — AB (ref 39.0–52.0)
HEMOGLOBIN: 8.7 g/dL — AB (ref 13.0–17.0)
MCH: 31.3 pg (ref 26.0–34.0)
MCHC: 33.5 g/dL (ref 30.0–36.0)
MCV: 93.5 fL (ref 78.0–100.0)
Platelets: 215 10*3/uL (ref 150–400)
RBC: 2.78 MIL/uL — AB (ref 4.22–5.81)
RDW: 16.9 % — AB (ref 11.5–15.5)
WBC: 6.6 10*3/uL (ref 4.0–10.5)

## 2015-07-09 MED ORDER — FUROSEMIDE 40 MG PO TABS
40.0000 mg | ORAL_TABLET | Freq: Every day | ORAL | Status: DC
  Administered 2015-07-10: 40 mg via ORAL
  Filled 2015-07-09: qty 1

## 2015-07-09 MED ORDER — HYDRALAZINE HCL 25 MG PO TABS
25.0000 mg | ORAL_TABLET | Freq: Two times a day (BID) | ORAL | Status: DC
  Administered 2015-07-09: 25 mg via ORAL
  Filled 2015-07-09: qty 1

## 2015-07-09 MED ORDER — ENSURE ENLIVE PO LIQD
237.0000 mL | Freq: Two times a day (BID) | ORAL | Status: DC
  Administered 2015-07-09 – 2015-07-15 (×7): 237 mL via ORAL

## 2015-07-09 NOTE — Progress Notes (Signed)
[facility]  Chart reviewed, pleural fluid cytology pending. Will see again when studies back.  [facility] ICU medical director rounds  Patient: Wesley [name] MR: [id number]  After reviewing the chart and discussing the patient with nursing staff in the ICU, it does not appear that the patient has a condition that requires ICU or step down level of care.  In an effort to best utilize system resources I have written orders to transfer the patient to a non-ICU/SDU inpatient unit.  Roselie Awkward, MD [facility] Pager: [phone] Cell: [phone] After 3pm or if no response, call [phone]  07/09/2015@10 :25 AM

## 2015-07-09 NOTE — Progress Notes (Signed)
PROGRESS NOTE  Wesley [name] NWG:[id number] DOB: 07-Sep-1945 DOA: 07/07/2015 PCP: [name], MD  Brief History 70 year old male with a history of multiple myeloma followed by Dr. [name] who presently is scheduled to start chemotherapy (3rd line therapy due to disease progression) on with Carfilzomib, Pomalyst and Decadron presents with progressive dyspnea over the past 1-2 days. Patient also received 2 units PRBC prior to his chemotherapy on 07/03/2015. Workup in the emergency department showed bilateral pleural effusions on chest x-ray. CT of the chest, abdomen and pelvis on 07/07/2015 revealed a moderate left and large right pleural effusion. This was causing mass effect on the anterior heart border. Echocardiogram was ordered causes of concerns for hemodynamic effects. CT of the chest also revealed bulky lymphadenopathy/soft tissue mass between the chest and pericardium causing some mass effect. The patient had pre-existing chest wall masses and right seventh rib masses. The patient was also noted to have a left periaortic mass showing enlargement causing mass effect on the left kidney. In addition there was evidence of new mesenteric lymphadenopathy and peritoneal implants and bilateral, right greater than left inguinal adenopathy. Along the right lateral margin of the liver there is a lentiform 5 x 2.5 cm mass indenting liver contour suggesting capsular implant. The patient remains tachycardic, but hemodynamically stable presently. Assessment/Plan: Acute respiratory failure with hypoxia -Secondary to pleural effusion likely resulting from disease progression for myeloma -Status post right thoracocentesis--1600 mL -Supplemental oxygen-->weaned off; stable on RA now -officially consult CCM-->will need pleurx if cytology positive for malignancy -difficult situation-->?Pleurx vs continue lasix (worsening renal fxn)  -presently stable on RA Bilateral pleural  effusions -Appreciate pulmonology -Status post right thoracocentesis--follow-up cytology -Likely malignant effusions -change to po lasix am 07/10/15 Anterior mediastinal/retrosternal lymphadenopathy  -CT suggested causing mass effect on pericardium -consult radiation oncology--> patient had simulation 07/09/2015 -Patient previously scheduled to have radiation to the skull -echo--EF 65-70%, grade 1 daily, no WMA, trivial pericardial effusion Hyponatremia  -Likely multifactorial including acute on chronic renal failure and poor oral intake -Urine osmolarity--268  -Serum osmolarity--260  -Uric acid--12.1 -Osmolarities do not suggest SIADH Hyperkalemia  -Expected improvement with furosemide IV  Acute on chronic renal failure (CKD stage 3) -In part due to diuresis  -Baseline creatinine 2.1-2.5 -Anticipate worsening with furosemide IV (120 mg bid) -Plan to switch to oral furosemide 07/10/2015 Hypertension  -Hold amlodipine  -Decrease hydralazine 25 twice a day -Blood pressure is decreased due to diuresis- Hydronephrosis  -Right-sided hydronephrosis has actually improved  -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 [name] has been notified of the patient's admission  -Patient is scheduled to start third line chemotherapy     Family Communication: No family at beside Disposition Plan: Home--1-2 days    Procedures/Studies: Ct Abdomen Pelvis Wo Contrast  07/07/2015  CLINICAL DATA:  Shortness of breath for 2 weeks, also right groin pain with known hernia in that region, personal history of multiple myeloma and bladder cancer EXAM: CT CHEST, ABDOMEN AND PELVIS WITHOUT CONTRAST TECHNIQUE: Multidetector CT imaging of the chest, abdomen and pelvis was performed following the standard protocol without IV contrast. COMPARISON:  Numerous prior CT scans and radiographs FINDINGS: CT CHEST FINDINGS Mediastinum/Lymph Nodes: On this non  contrasted study there is bulky nodularity in the retrosternal region of the superior mediastinum. This likely represents a combination of prominent venous structures +adenopathy. The heart is mildly enlarged, and there is mass effect on the heart anteriorly from bulky  multinodular soft tissue density interposed between the anterior chest wall in the anterior pericardium. This extends from left to right measuring 17 cm. This process demonstrates a thickness of 3.4 cm. Lungs/Pleura: There is a small to moderate left pleural effusion and there is a large right pleural effusion. Minimal aerated right lung is involved with atelectasis. Aerated left lung is also involved with mild compressive atelectasis. The lungs are otherwise clear allowing for the presence of respiratory motion. Musculoskeletal: There are bilateral paramedian anterior chest wall masses just inferior to the sternomanubrial joint. These were not present previously. These measure about 2.5 cm in AP diameter maximally. This process continues inferiorly to blend with the soft tissue mass anterior to the heart described above. Mass along the right lateral seventh rib again identified, smaller than on the prior study, today measuring approximately 5.7 x 3.1 cm as compared to 6.5 x 3.8 cm previously. CT ABDOMEN PELVIS FINDINGS Hepatobiliary: No focal abnormalities given limited non contrasted evaluation. Gallbladder is negative. Pancreas: Pancreatic tail is draped anteriorly around an enlarged mass arising anteriorly off of the left kidney. On 03/20/2015 this mass measured 46 x 50 mm. It is markedly enlarged today measuring 78 x 104 mm. Spleen: No significant abnormalities Adrenals/Urinary Tract: Adrenal glands negative. Right kidney demonstrates mild hydronephrosis. Right hydronephrosis and hydroureter slightly less prominent when compared to 03/20/2015. There is severe diffuse bladder wall thickening, with a lobulated bladder mass again identified similar  to prior studies. Stomach/Bowel: Nonobstructive gas pattern. New no focal bowel abnormalities. Vascular/Lymphatic: No evidence of aortic dilatation. There is adenopathy inferior to the aortic bifurcation measuring about 2.7 cm in short axis. There is extensive adenopathy in the bilateral inguinal regions right greater than left. Largest right inguinal lymph node measures about 3.8 cm in short axis. Soft tissue material within the right inguinal canal appears consistent with adenopathy within inguinal hernia. No definite bowel involvement. There is peritoneal nodularity in the pericolic gutter on the left measuring 43 by 25 mm on image number 59. This was not present previously. Mesenteric adenopathy left lower quadrant also new or increased with the largest lymph node measuring 15 mm. Along the right lateral margin of the liver there is a lentiform 5 x 2.5 cm mass indenting liver contour suggesting capsular implant. Reproductive: Bilateral inguinal hernias containing fat. Additionally, as described above, soft tissue nodules are seen within the right inguinal hernia suggesting adenopathy. Other: There is trace ascites present in the abdomen and pelvis Musculoskeletal: No evidence of vertebra plana. No acute musculoskeletal findings. IMPRESSION: 1. Small to moderate left and large right pleural effusion. Proportional bilateral volume loss as a result. These are new from 03/20/2015. 2. Mass effect on the anterior heart border may be hemodynamically significant. The jugular veins appear more prominent than they did previously. 3. Bulky multinodular soft tissue mass interposed between the anterior chest wall and anterior pericardium causing mass effect, likely reflecting extensive mediastinal adenopathy. There is similar fullness in the superior mediastinum which likely reflects a combination of venous engorgement and adenopathy. 4. Right chest wall mass persists but is smaller when compared to prior study. 5. Market  enlargement of left periaortic mass inseparable from left kidney. This causes significant mass effect upon the left kidney as well as on the tail of the pancreas and stomach. 6. Evidence of new mesenteric adenopathy and peritoneal implants as well as inguinal adenopathy. 7. Severe diffuse bladder wall thickening. Lobulated inferior posterior bladder mass again identified. 8. Moderate right hydroureteronephrosis slightly less pronounced when  compared to the prior study. Electronically Signed   By: Esperanza Heir M.D.   On: 07/07/2015 10:33   Dg Chest 1 View  07/08/2015  CLINICAL DATA:  Post right thoracentesis. History of right pulmonary nodule, bladder tumor, multiple myeloma. EXAM: CHEST 1 VIEW COMPARISON:  07/07/2015 FINDINGS: Shallow inspiration. Bilateral pleural effusions again demonstrated. Left pleural effusion appears increased since previous study, possibly due to shallow inspiration. Mild cardiac enlargement. Atelectasis in the lung bases. No pneumothorax. Power port type central venous catheter with tip over the low SVC region. IMPRESSION: Bilateral pleural effusions and basilar atelectasis, possibly increasing on the left. No pneumothorax. Electronically Signed   By: Burman Nieves M.D.   On: 07/08/2015 00:32   Dg Chest 2 View  07/07/2015  CLINICAL DATA:  Shortness of breath for 2 weeks with some anterior left chest pain, history of multiple myeloma and bladder cancer EXAM: CHEST  2 VIEW COMPARISON:  06/26/2015 FINDINGS: Mildly enlarged cardiac silhouette. Elevated right diaphragm. Bibasilar opacities suggesting a combination of airspace disease and pleural effusion. Stable right Port-A-Cath. IMPRESSION: Bilateral lower lobe opacity right worse than left suggesting a combination of pleural effusion and consolidation. Electronically Signed   By: Esperanza Heir M.D.   On: 07/07/2015 09:05   Dg Chest 2 View  06/26/2015  CLINICAL DATA:  Shortness of breath and cough.  Sharp chest pain. EXAM: CHEST   2 VIEW COMPARISON:  03/20/2015 and CT scan of the chest dated 11/23/2014 FINDINGS: There are new small bilateral pleural effusions. The nodule at the left lung base posteriorly has increased in size and measures 13 mm in diameter. This was approximately 9 mm in size on the prior CT scan. There is suggestion of a new 13 mm nodule at the right lung base laterally adjacent to the mass destroying the lateral aspect of the right seventh rib. That mass appears essentially unchanged. No new bone lesions. There is slight cardiomegaly. Pulmonary vascularity is normal. Power port appears in good position. IMPRESSION: 1. New small bilateral pleural effusions. 2. Slight increase in size of the pulmonary nodule at the left lung base posteriorly. 3. Possible new nodule at the right lung base laterally. 4. No significant change in the mass destroying the lateral aspect of the right seventh rib. Electronically Signed   By: Francene Boyers M.D.   On: 06/26/2015 12:51   Ct Chest Wo Contrast  07/07/2015  CLINICAL DATA:  Shortness of breath for 2 weeks, also right groin pain with known hernia in that region, personal history of multiple myeloma and bladder cancer EXAM: CT CHEST, ABDOMEN AND PELVIS WITHOUT CONTRAST TECHNIQUE: Multidetector CT imaging of the chest, abdomen and pelvis was performed following the standard protocol without IV contrast. COMPARISON:  Numerous prior CT scans and radiographs FINDINGS: CT CHEST FINDINGS Mediastinum/Lymph Nodes: On this non contrasted study there is bulky nodularity in the retrosternal region of the superior mediastinum. This likely represents a combination of prominent venous structures +adenopathy. The heart is mildly enlarged, and there is mass effect on the heart anteriorly from bulky multinodular soft tissue density interposed between the anterior chest wall in the anterior pericardium. This extends from left to right measuring 17 cm. This process demonstrates a thickness of 3.4 cm.  Lungs/Pleura: There is a small to moderate left pleural effusion and there is a large right pleural effusion. Minimal aerated right lung is involved with atelectasis. Aerated left lung is also involved with mild compressive atelectasis. The lungs are otherwise clear allowing for the presence  of respiratory motion. Musculoskeletal: There are bilateral paramedian anterior chest wall masses just inferior to the sternomanubrial joint. These were not present previously. These measure about 2.5 cm in AP diameter maximally. This process continues inferiorly to blend with the soft tissue mass anterior to the heart described above. Mass along the right lateral seventh rib again identified, smaller than on the prior study, today measuring approximately 5.7 x 3.1 cm as compared to 6.5 x 3.8 cm previously. CT ABDOMEN PELVIS FINDINGS Hepatobiliary: No focal abnormalities given limited non contrasted evaluation. Gallbladder is negative. Pancreas: Pancreatic tail is draped anteriorly around an enlarged mass arising anteriorly off of the left kidney. On 03/20/2015 this mass measured 46 x 50 mm. It is markedly enlarged today measuring 78 x 104 mm. Spleen: No significant abnormalities Adrenals/Urinary Tract: Adrenal glands negative. Right kidney demonstrates mild hydronephrosis. Right hydronephrosis and hydroureter slightly less prominent when compared to 03/20/2015. There is severe diffuse bladder wall thickening, with a lobulated bladder mass again identified similar to prior studies. Stomach/Bowel: Nonobstructive gas pattern. New no focal bowel abnormalities. Vascular/Lymphatic: No evidence of aortic dilatation. There is adenopathy inferior to the aortic bifurcation measuring about 2.7 cm in short axis. There is extensive adenopathy in the bilateral inguinal regions right greater than left. Largest right inguinal lymph node measures about 3.8 cm in short axis. Soft tissue material within the right inguinal canal appears consistent  with adenopathy within inguinal hernia. No definite bowel involvement. There is peritoneal nodularity in the pericolic gutter on the left measuring 43 by 25 mm on image number 59. This was not present previously. Mesenteric adenopathy left lower quadrant also new or increased with the largest lymph node measuring 15 mm. Along the right lateral margin of the liver there is a lentiform 5 x 2.5 cm mass indenting liver contour suggesting capsular implant. Reproductive: Bilateral inguinal hernias containing fat. Additionally, as described above, soft tissue nodules are seen within the right inguinal hernia suggesting adenopathy. Other: There is trace ascites present in the abdomen and pelvis Musculoskeletal: No evidence of vertebra plana. No acute musculoskeletal findings. IMPRESSION: 1. Small to moderate left and large right pleural effusion. Proportional bilateral volume loss as a result. These are new from 03/20/2015. 2. Mass effect on the anterior heart border may be hemodynamically significant. The jugular veins appear more prominent than they did previously. 3. Bulky multinodular soft tissue mass interposed between the anterior chest wall and anterior pericardium causing mass effect, likely reflecting extensive mediastinal adenopathy. There is similar fullness in the superior mediastinum which likely reflects a combination of venous engorgement and adenopathy. 4. Right chest wall mass persists but is smaller when compared to prior study. 5. Market enlargement of left periaortic mass inseparable from left kidney. This causes significant mass effect upon the left kidney as well as on the tail of the pancreas and stomach. 6. Evidence of new mesenteric adenopathy and peritoneal implants as well as inguinal adenopathy. 7. Severe diffuse bladder wall thickening. Lobulated inferior posterior bladder mass again identified. 8. Moderate right hydroureteronephrosis slightly less pronounced when compared to the prior study.  Electronically Signed   By: Skipper Cliche M.D.   On: 07/07/2015 10:33   Nm Pulmonary Perf And Vent  06/26/2015  CLINICAL DATA:  Sudden onset chest pain this afternoon with shortness of breath. EXAM: NUCLEAR MEDICINE VENTILATION - PERFUSION LUNG SCAN TECHNIQUE: Ventilation images were obtained in multiple projections using inhaled aerosol Tc-6mDTPA. Perfusion images were obtained in multiple projections after intravenous injection of Tc-980mAA.  RADIOPHARMACEUTICALS:  33.3 Technetium-15mDTPA aerosol inhalation and 4.3 Technetium-975mAA IV COMPARISON:  Chest x-ray from today. FINDINGS: Ventilation: No focal ventilation defect. Perfusion: No wedge shaped peripheral perfusion defects to suggest acute pulmonary embolism. IMPRESSION: Normal bilateral lung perfusion. Electronically Signed   By: ErMisty Stanley.D.   On: 06/26/2015 19:57   Ir Fluoro Guide Cv Line Right  06/10/2015  CLINICAL DATA:  Multiple myeloma and need for porta cath for continued therapy. EXAM: IMPLANTED PORT A CATH PLACEMENT WITH ULTRASOUND AND FLUOROSCOPIC GUIDANCE ANESTHESIA/SEDATION: 3.0 mg IV Versed; 50 mcg IV Fentanyl Total Moderate Sedation Time:  37 minutes The patient's level of consciousness and physiologic status were continuously monitored during the procedure by Radiology nursing. Additional Medications: 2 g IV Ancef. As antibiotic prophylaxis, Ancef was ordered pre-procedure and administered intravenously within one hour of incision. FLUOROSCOPY TIME:  30 seconds. PROCEDURE: The procedure, risks, benefits, and alternatives were explained to the patient. Questions regarding the procedure were encouraged and answered. The patient understands and consents to the procedure. A time-out was performed prior to the procedure. The right neck and chest were prepped with chlorhexidine in a sterile fashion, and a sterile drape was applied covering the operative field. Maximum barrier sterile technique with sterile gowns and gloves were used  for the procedure. Local anesthesia was provided with 1% lidocaine. Ultrasound was used to confirm patency of the right internal jugular vein After creating a small venotomy incision, a 21 gauge needle was advanced into the right internal jugular vein under direct, real-time ultrasound guidance. Ultrasound image documentation was performed. After securing guidewire access, an 8 Fr dilator was placed. A J-wire was kinked to measure appropriate catheter length. A subcutaneous port pocket was then created along the upper chest wall utilizing sharp and blunt dissection. Portable cautery was utilized. The pocket was irrigated with sterile saline. A single lumen power injectable port was chosen for placement. The 8 Fr catheter was tunneled from the port pocket site to the venotomy incision. The port was placed in the pocket. External catheter was trimmed to appropriate length based on guidewire measurement. At the venotomy, an 8 Fr peel-away sheath was placed over a guidewire. The catheter was then placed through the sheath and the sheath removed. Final catheter positioning was confirmed and documented with a fluoroscopic spot image. The port was accessed with a needle and aspirated and flushed with heparinized saline. The needle was removed. The venotomy and port pocket incisions were closed with subcutaneous 3-0 Monocryl and subcuticular 4-0 Vicryl. Dermabond was applied to both incisions. COMPLICATIONS: None FINDINGS: After catheter placement, the tip lies at the cavoatrial junction. The catheter aspirates normally and is ready for immediate use. IMPRESSION: Placement of single lumen port a cath via right internal jugular vein. The catheter tip lies at the cavoatrial junction. A power injectable port a cath was placed and is ready for immediate use. Electronically Signed   By: GlAletta Edouard.D.   On: 06/10/2015 10:37   Ir UsKoreauide Vasc Access Right  06/10/2015  CLINICAL DATA:  Multiple myeloma and need for porta  cath for continued therapy. EXAM: IMPLANTED PORT A CATH PLACEMENT WITH ULTRASOUND AND FLUOROSCOPIC GUIDANCE ANESTHESIA/SEDATION: 3.0 mg IV Versed; 50 mcg IV Fentanyl Total Moderate Sedation Time:  37 minutes The patient's level of consciousness and physiologic status were continuously monitored during the procedure by Radiology nursing. Additional Medications: 2 g IV Ancef. As antibiotic prophylaxis, Ancef was ordered pre-procedure and administered intravenously within one hour of incision. FLUOROSCOPY  TIME:  30 seconds. PROCEDURE: The procedure, risks, benefits, and alternatives were explained to the patient. Questions regarding the procedure were encouraged and answered. The patient understands and consents to the procedure. A time-out was performed prior to the procedure. The right neck and chest were prepped with chlorhexidine in a sterile fashion, and a sterile drape was applied covering the operative field. Maximum barrier sterile technique with sterile gowns and gloves were used for the procedure. Local anesthesia was provided with 1% lidocaine. Ultrasound was used to confirm patency of the right internal jugular vein After creating a small venotomy incision, a 21 gauge needle was advanced into the right internal jugular vein under direct, real-time ultrasound guidance. Ultrasound image documentation was performed. After securing guidewire access, an 8 Fr dilator was placed. A J-wire was kinked to measure appropriate catheter length. A subcutaneous port pocket was then created along the upper chest wall utilizing sharp and blunt dissection. Portable cautery was utilized. The pocket was irrigated with sterile saline. A single lumen power injectable port was chosen for placement. The 8 Fr catheter was tunneled from the port pocket site to the venotomy incision. The port was placed in the pocket. External catheter was trimmed to appropriate length based on guidewire measurement. At the venotomy, an 8 Fr  peel-away sheath was placed over a guidewire. The catheter was then placed through the sheath and the sheath removed. Final catheter positioning was confirmed and documented with a fluoroscopic spot image. The port was accessed with a needle and aspirated and flushed with heparinized saline. The needle was removed. The venotomy and port pocket incisions were closed with subcutaneous 3-0 Monocryl and subcuticular 4-0 Vicryl. Dermabond was applied to both incisions. COMPLICATIONS: None FINDINGS: After catheter placement, the tip lies at the cavoatrial junction. The catheter aspirates normally and is ready for immediate use. IMPRESSION: Placement of single lumen port a cath via right internal jugular vein. The catheter tip lies at the cavoatrial junction. A power injectable port a cath was placed and is ready for immediate use. Electronically Signed   By: Aletta Edouard M.D.   On: 06/10/2015 10:37   Dg Chest Port 1 View  07/09/2015  CLINICAL DATA:  Follow-up pleural effusion, respiratory failure, metastatic malignancy, acute and chronic renal failure. EXAM: PORTABLE CHEST 1 VIEW COMPARISON:  Portable chest x-ray of July 07, 2015 FINDINGS: The small pleural effusion on the right is again demonstrated. Inflation of the right lung is improved. There is soft tissue density noted along the lateral thoracic wall with loss of the contour of the right seventh rib. The left lung is well-expanded. Increased density at the left lung base persists. There is a small left pleural effusion. The cardiac silhouette remains enlarged. The pulmonary vascularity is not engorged. The Port-A-Cath appliance tip projects over the midportion of the SVC. There are surgical clips overlying the left lateral chest wall. IMPRESSION: Stable appearance of small bilateral pleural effusions. Left-sided basilar atelectasis is somewhat less conspicuous. Lysis of the lateral aspect of the right seventh rib is better demonstrated today. Electronically  Signed   By: Kathan Kirker  [place] M.D.   On: 07/09/2015 07:05         Subjective: Patient is breathing much better today. Denies any fevers, chills, chest pain, shortness breath, nausea, vomiting, diarrhea, abdominal pain. No dysuria or hematuria. No hematochezia. Denies any headache or neck pain.   Objective: Filed Vitals:   07/09/15 1259 07/09/15 1300 07/09/15 1600 07/09/15 1605  BP: 113/75 113/75  113/73  Pulse:    118  Temp:   98.1 F (36.7 C)   TempSrc:      Resp:    22  Weight:      SpO2:    100%    Intake/Output Summary (Last 24 hours) at 07/09/15 1900 Last data filed at 07/09/15 1804  Gross per 24 hour  Intake    962 ml  Output   3085 ml  Net  -2123 ml   Weight change: -4.8 kg (-10 lb 9.3 oz) Exam:   General:  Pt is alert, follows commands appropriately, not in acute distress  HEENT: No icterus, No thrush, No neck mass, [place]/AT  Cardiovascular: RRR, S1/S2, no rubs, no gallops  Respiratory: Bibasilar crackles. Diminished breath sound bilateral. No wheezing.   Abdomen: Soft/+BS, non tender, non distended, no guarding; no hepatosplenomegaly   Extremities: No edema, No lymphangitis, No petechiae, No rashes, no synovitis; no clubbing or cyanosis.  Data Reviewed: Basic Metabolic Panel:  Recent Labs Lab 07/03/15 0936 07/07/15 0910 07/07/15 1349 07/08/15 0400 07/09/15 0400  NA 131* 126* 123* 122* 126*  K 4.8 5.2* 5.5* 5.4* 4.7  CL  --  98* 95* 94* 94*  CO2 20* 19* 17* 19* 22  GLUCOSE 150* 111* 134* 130* 130*  BUN 20.'8 17 17 '$ 23* 34*  CREATININE 2.7* 2.52* 2.39* 2.81* 2.79*  CALCIUM 9.1 9.1 8.9 8.9 9.0   Liver Function Tests:  Recent Labs Lab 07/03/15 0936 07/07/15 0910 07/09/15 0400  AST '14 20 21  '$ ALT 10 13* 14*  ALKPHOS 44 40 36*  BILITOT 1.04 1.0 0.6  PROT 8.9* 9.1* 9.0*  ALBUMIN 3.3* 3.7 3.5   No results for input(s): LIPASE, AMYLASE in the last 168 hours. No results for input(s): AMMONIA in the last 168 hours. CBC:  Recent Labs Lab  07/03/15 0936 07/07/15 0910 07/07/15 1349 07/08/15 0400 07/09/15 0400  WBC 4.9 4.0 3.3* 6.2 6.6  NEUTROABS 3.1 2.6  --   --   --   HGB 7.5* 9.2* 9.4* 8.9* 8.7*  HCT 22.9* 27.9* 28.1* 26.9* 26.0*  MCV 99.9* 95.2 95.3 93.4 93.5  PLT 223 234 196 202 215   Cardiac Enzymes: No results for input(s): CKTOTAL, CKMB, CKMBINDEX, TROPONINI in the last 168 hours. BNP: Invalid input(s): POCBNP CBG: No results for input(s): GLUCAP in the last 168 hours.  Recent Results (from the past 240 hour(s))  Culture, blood (Routine X 2) w Reflex to ID Panel     Status: None (Preliminary result)   Collection Time: 07/07/15  9:31 AM  Result Value Ref Range Status   Specimen Description BLOOD RIGHT CHEST  Final   Special Requests BOTTLES DRAWN AEROBIC AND ANAEROBIC 5 CC  Final   Culture   Final    NO GROWTH 2 DAYS Performed at [facility]    Report Status PENDING  Incomplete  Culture, blood (Routine X 2) w Reflex to ID Panel     Status: None (Preliminary result)   Collection Time: 07/07/15  9:41 AM  Result Value Ref Range Status   Specimen Description BLOOD RIGHT ANTECUBITAL  Final   Special Requests BOTTLES DRAWN AEROBIC AND ANAEROBIC 5 CC  Final   Culture   Final    NO GROWTH 2 DAYS Performed at [facility]    Report Status PENDING  Incomplete  MRSA PCR Screening     Status: None   Collection Time: 07/07/15  1:09 PM  Result Value Ref Range Status   MRSA by  PCR NEGATIVE NEGATIVE Final    Comment:        The GeneXpert MRSA Assay (FDA approved for NASAL specimens only), is one component of a comprehensive MRSA colonization surveillance program. It is not intended to diagnose MRSA infection nor to guide or monitor treatment for MRSA infections.      Scheduled Meds: . azithromycin  500 mg Intravenous Q24H  . cefTRIAXone (ROCEPHIN)  IV  1 g Intravenous Q24H  . dexamethasone  4 mg Intravenous 4 times per day  . feeding supplement (ENSURE ENLIVE)  237 mL Oral BID BM  .  finasteride  5 mg Oral Daily  . furosemide  120 mg Intravenous Q12H  . hydrALAZINE  50 mg Oral 3 times per day  . ipratropium-albuterol  3 mL Nebulization TID  . sodium chloride flush  3 mL Intravenous Q12H  . tamsulosin  0.4 mg Oral BID   Continuous Infusions:    Cletis Clack, DO  [facility] Pager [phone]  If 7PM-7AM, please contact night-coverage [email or web address] Password [email or web address] 07/09/2015, 7:00 PM   LOS: 2 days

## 2015-07-09 NOTE — Telephone Encounter (Signed)
Per [name], RN in ICU, Mr. Wesley [name] is OK to come to radiation for CT SIM at 1:30.  Notified Candace, RT in CT SIM.

## 2015-07-09 NOTE — Progress Notes (Signed)
Subjective: The patient is seen and examined today. He was admitted this weekend with worsening dyspnea. CT scan of the chest, abdomen and pelvis showed several masses in the chest and abdomen concerning for disease progression. This included mass effect on the anterior heart border as well as bulky multinodular soft tissue mass between the anterior chest wall and the anterior pericardium causing mass effect and extensive mediastinal adenopathy. There was also right chest wall mass and marked enlargement of left periaortic mass inseparable from the left kidney. The patient is feeling a little bit better today. He denied having any fever or chills but continues to have chest pain and shortness of breath.  Objective: Vital signs in last 24 hours: Temp:  [98 F (36.7 C)-98.3 F (36.8 C)] 98.3 F (36.8 C) (03/07 0315) Pulse Rate:  [101-121] 111 (03/07 0800) Resp:  [12-23] 17 (03/07 0800) BP: (118-142)/(63-94) 126/87 mmHg (03/07 0800) SpO2:  [95 %-100 %] 96 % (03/07 0800) Weight:  [184 lb 4.9 oz (83.6 kg)] 184 lb 4.9 oz (83.6 kg) (03/07 0500)  Intake/Output from previous day: 03/06 0701 - 03/07 0700 In: 424 [IV Piggyback:424] Out: 3720 [Urine:3720] Intake/Output this shift: Total I/O In: -  Out: 300 [Urine:300]  General appearance: alert, cooperative, fatigued and mild distress Resp: wheezes bilaterally Cardio: regular rate and rhythm, S1, S2 normal, no murmur, click, rub or gallop GI: soft, non-tender; bowel sounds normal; no masses,  no organomegaly Extremities: extremities normal, atraumatic, no cyanosis or edema  Lab Results:   Recent Labs  07/08/15 0400 07/09/15 0400  WBC 6.2 6.6  HGB 8.9* 8.7*  HCT 26.9* 26.0*  PLT 202 215   BMET  Recent Labs  07/08/15 0400 07/09/15 0400  NA 122* 126*  K 5.4* 4.7  CL 94* 94*  CO2 19* 22  GLUCOSE 130* 130*  BUN 23* 34*  CREATININE 2.81* 2.79*  CALCIUM 8.9 9.0    Studies/Results: Ct Abdomen Pelvis Wo Contrast  07/07/2015   CLINICAL DATA:  Shortness of breath for 2 weeks, also right groin pain with known hernia in that region, personal history of multiple myeloma and bladder cancer EXAM: CT CHEST, ABDOMEN AND PELVIS WITHOUT CONTRAST TECHNIQUE: Multidetector CT imaging of the chest, abdomen and pelvis was performed following the standard protocol without IV contrast. COMPARISON:  Numerous prior CT scans and radiographs FINDINGS: CT CHEST FINDINGS Mediastinum/Lymph Nodes: On this non contrasted study there is bulky nodularity in the retrosternal region of the superior mediastinum. This likely represents a combination of prominent venous structures +adenopathy. The heart is mildly enlarged, and there is mass effect on the heart anteriorly from bulky multinodular soft tissue density interposed between the anterior chest wall in the anterior pericardium. This extends from left to right measuring 17 cm. This process demonstrates a thickness of 3.4 cm. Lungs/Pleura: There is a small to moderate left pleural effusion and there is a large right pleural effusion. Minimal aerated right lung is involved with atelectasis. Aerated left lung is also involved with mild compressive atelectasis. The lungs are otherwise clear allowing for the presence of respiratory motion. Musculoskeletal: There are bilateral paramedian anterior chest wall masses just inferior to the sternomanubrial joint. These were not present previously. These measure about 2.5 cm in AP diameter maximally. This process continues inferiorly to blend with the soft tissue mass anterior to the heart described above. Mass along the right lateral seventh rib again identified, smaller than on the prior study, today measuring approximately 5.7 x 3.1 cm as compared to 6.5  x 3.8 cm previously. CT ABDOMEN PELVIS FINDINGS Hepatobiliary: No focal abnormalities given limited non contrasted evaluation. Gallbladder is negative. Pancreas: Pancreatic tail is draped anteriorly around an enlarged mass  arising anteriorly off of the left kidney. On 03/20/2015 this mass measured 46 x 50 mm. It is markedly enlarged today measuring 78 x 104 mm. Spleen: No significant abnormalities Adrenals/Urinary Tract: Adrenal glands negative. Right kidney demonstrates mild hydronephrosis. Right hydronephrosis and hydroureter slightly less prominent when compared to 03/20/2015. There is severe diffuse bladder wall thickening, with a lobulated bladder mass again identified similar to prior studies. Stomach/Bowel: Nonobstructive gas pattern. New no focal bowel abnormalities. Vascular/Lymphatic: No evidence of aortic dilatation. There is adenopathy inferior to the aortic bifurcation measuring about 2.7 cm in short axis. There is extensive adenopathy in the bilateral inguinal regions right greater than left. Largest right inguinal lymph node measures about 3.8 cm in short axis. Soft tissue material within the right inguinal canal appears consistent with adenopathy within inguinal hernia. No definite bowel involvement. There is peritoneal nodularity in the pericolic gutter on the left measuring 43 by 25 mm on image number 59. This was not present previously. Mesenteric adenopathy left lower quadrant also new or increased with the largest lymph node measuring 15 mm. Along the right lateral margin of the liver there is a lentiform 5 x 2.5 cm mass indenting liver contour suggesting capsular implant. Reproductive: Bilateral inguinal hernias containing fat. Additionally, as described above, soft tissue nodules are seen within the right inguinal hernia suggesting adenopathy. Other: There is trace ascites present in the abdomen and pelvis Musculoskeletal: No evidence of vertebra plana. No acute musculoskeletal findings. IMPRESSION: 1. Small to moderate left and large right pleural effusion. Proportional bilateral volume loss as a result. These are new from 03/20/2015. 2. Mass effect on the anterior heart border may be hemodynamically  significant. The jugular veins appear more prominent than they did previously. 3. Bulky multinodular soft tissue mass interposed between the anterior chest wall and anterior pericardium causing mass effect, likely reflecting extensive mediastinal adenopathy. There is similar fullness in the superior mediastinum which likely reflects a combination of venous engorgement and adenopathy. 4. Right chest wall mass persists but is smaller when compared to prior study. 5. Market enlargement of left periaortic mass inseparable from left kidney. This causes significant mass effect upon the left kidney as well as on the tail of the pancreas and stomach. 6. Evidence of new mesenteric adenopathy and peritoneal implants as well as inguinal adenopathy. 7. Severe diffuse bladder wall thickening. Lobulated inferior posterior bladder mass again identified. 8. Moderate right hydroureteronephrosis slightly less pronounced when compared to the prior study. Electronically Signed   By: Skipper Cliche M.D.   On: 07/07/2015 10:33   Dg Chest 1 View  07/08/2015  CLINICAL DATA:  Post right thoracentesis. History of right pulmonary nodule, bladder tumor, multiple myeloma. EXAM: CHEST 1 VIEW COMPARISON:  07/07/2015 FINDINGS: Shallow inspiration. Bilateral pleural effusions again demonstrated. Left pleural effusion appears increased since previous study, possibly due to shallow inspiration. Mild cardiac enlargement. Atelectasis in the lung bases. No pneumothorax. Power port type central venous catheter with tip over the low SVC region. IMPRESSION: Bilateral pleural effusions and basilar atelectasis, possibly increasing on the left. No pneumothorax. Electronically Signed   By: Lucienne Capers M.D.   On: 07/08/2015 00:32   Dg Chest 2 View  07/07/2015  CLINICAL DATA:  Shortness of breath for 2 weeks with some anterior left chest pain, history of multiple myeloma and  bladder cancer EXAM: CHEST  2 VIEW COMPARISON:  06/26/2015 FINDINGS: Mildly  enlarged cardiac silhouette. Elevated right diaphragm. Bibasilar opacities suggesting a combination of airspace disease and pleural effusion. Stable right Port-A-Cath. IMPRESSION: Bilateral lower lobe opacity right worse than left suggesting a combination of pleural effusion and consolidation. Electronically Signed   By: Skipper Cliche M.D.   On: 07/07/2015 09:05   Ct Chest Wo Contrast  07/07/2015  CLINICAL DATA:  Shortness of breath for 2 weeks, also right groin pain with known hernia in that region, personal history of multiple myeloma and bladder cancer EXAM: CT CHEST, ABDOMEN AND PELVIS WITHOUT CONTRAST TECHNIQUE: Multidetector CT imaging of the chest, abdomen and pelvis was performed following the standard protocol without IV contrast. COMPARISON:  Numerous prior CT scans and radiographs FINDINGS: CT CHEST FINDINGS Mediastinum/Lymph Nodes: On this non contrasted study there is bulky nodularity in the retrosternal region of the superior mediastinum. This likely represents a combination of prominent venous structures +adenopathy. The heart is mildly enlarged, and there is mass effect on the heart anteriorly from bulky multinodular soft tissue density interposed between the anterior chest wall in the anterior pericardium. This extends from left to right measuring 17 cm. This process demonstrates a thickness of 3.4 cm. Lungs/Pleura: There is a small to moderate left pleural effusion and there is a large right pleural effusion. Minimal aerated right lung is involved with atelectasis. Aerated left lung is also involved with mild compressive atelectasis. The lungs are otherwise clear allowing for the presence of respiratory motion. Musculoskeletal: There are bilateral paramedian anterior chest wall masses just inferior to the sternomanubrial joint. These were not present previously. These measure about 2.5 cm in AP diameter maximally. This process continues inferiorly to blend with the soft tissue mass anterior to  the heart described above. Mass along the right lateral seventh rib again identified, smaller than on the prior study, today measuring approximately 5.7 x 3.1 cm as compared to 6.5 x 3.8 cm previously. CT ABDOMEN PELVIS FINDINGS Hepatobiliary: No focal abnormalities given limited non contrasted evaluation. Gallbladder is negative. Pancreas: Pancreatic tail is draped anteriorly around an enlarged mass arising anteriorly off of the left kidney. On 03/20/2015 this mass measured 46 x 50 mm. It is markedly enlarged today measuring 78 x 104 mm. Spleen: No significant abnormalities Adrenals/Urinary Tract: Adrenal glands negative. Right kidney demonstrates mild hydronephrosis. Right hydronephrosis and hydroureter slightly less prominent when compared to 03/20/2015. There is severe diffuse bladder wall thickening, with a lobulated bladder mass again identified similar to prior studies. Stomach/Bowel: Nonobstructive gas pattern. New no focal bowel abnormalities. Vascular/Lymphatic: No evidence of aortic dilatation. There is adenopathy inferior to the aortic bifurcation measuring about 2.7 cm in short axis. There is extensive adenopathy in the bilateral inguinal regions right greater than left. Largest right inguinal lymph node measures about 3.8 cm in short axis. Soft tissue material within the right inguinal canal appears consistent with adenopathy within inguinal hernia. No definite bowel involvement. There is peritoneal nodularity in the pericolic gutter on the left measuring 43 by 25 mm on image number 59. This was not present previously. Mesenteric adenopathy left lower quadrant also new or increased with the largest lymph node measuring 15 mm. Along the right lateral margin of the liver there is a lentiform 5 x 2.5 cm mass indenting liver contour suggesting capsular implant. Reproductive: Bilateral inguinal hernias containing fat. Additionally, as described above, soft tissue nodules are seen within the right inguinal  hernia suggesting adenopathy. Other: There is  trace ascites present in the abdomen and pelvis Musculoskeletal: No evidence of vertebra plana. No acute musculoskeletal findings. IMPRESSION: 1. Small to moderate left and large right pleural effusion. Proportional bilateral volume loss as a result. These are new from 03/20/2015. 2. Mass effect on the anterior heart border may be hemodynamically significant. The jugular veins appear more prominent than they did previously. 3. Bulky multinodular soft tissue mass interposed between the anterior chest wall and anterior pericardium causing mass effect, likely reflecting extensive mediastinal adenopathy. There is similar fullness in the superior mediastinum which likely reflects a combination of venous engorgement and adenopathy. 4. Right chest wall mass persists but is smaller when compared to prior study. 5. Market enlargement of left periaortic mass inseparable from left kidney. This causes significant mass effect upon the left kidney as well as on the tail of the pancreas and stomach. 6. Evidence of new mesenteric adenopathy and peritoneal implants as well as inguinal adenopathy. 7. Severe diffuse bladder wall thickening. Lobulated inferior posterior bladder mass again identified. 8. Moderate right hydroureteronephrosis slightly less pronounced when compared to the prior study. Electronically Signed   By: Skipper Cliche M.D.   On: 07/07/2015 10:33   Dg Chest Port 1 View  07/09/2015  CLINICAL DATA:  Follow-up pleural effusion, respiratory failure, metastatic malignancy, acute and chronic renal failure. EXAM: PORTABLE CHEST 1 VIEW COMPARISON:  Portable chest x-ray of July 07, 2015 FINDINGS: The small pleural effusion on the right is again demonstrated. Inflation of the right lung is improved. There is soft tissue density noted along the lateral thoracic wall with loss of the contour of the right seventh rib. The left lung is well-expanded. Increased density at the left  lung base persists. There is a small left pleural effusion. The cardiac silhouette remains enlarged. The pulmonary vascularity is not engorged. The Port-A-Cath appliance tip projects over the midportion of the SVC. There are surgical clips overlying the left lateral chest wall. IMPRESSION: Stable appearance of small bilateral pleural effusions. Left-sided basilar atelectasis is somewhat less conspicuous. Lysis of the lateral aspect of the right seventh rib is better demonstrated today. Electronically Signed   By: David  Martinique M.D.   On: 07/09/2015 07:05    Medications: I have reviewed the patient's current medications.  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 Unfortunately the patient did not respond well to the previous chemotherapy regimens. He is expected to start a new systemic chemotherapy with Carfilzomib, Pomalyst and Decadron soon. I would consider the patient for referral to [facility] or [facility] for second opinion after his discharge from the hospital. I also strongly recommending for the patient to proceed with a course of palliative radiotherapy to the enlarging masses under the care of Dr. [name]. The patient may benefit from continuing treatment with steroids at this point. Thank you so much for taking good care of Mr. Millea, I will continue to follow up the patient with you and assist in his management on as-needed basis.    LOS: 2 days    Yamilex Borgwardt K. 07/09/2015

## 2015-07-09 NOTE — Progress Notes (Addendum)
Initial Nutrition Assessment  DOCUMENTATION CODES:   Severe malnutrition in context of acute illness/injury  INTERVENTION:  -Ensure Enlive po BID, each supplement provides 350 kcal and 20 grams of protein -RD to continue to monitor for needs  NUTRITION DIAGNOSIS:   Malnutrition related to acute illness as evidenced by energy intake < 75% for > 7 days, percent weight loss, moderate depletions of muscle mass.  GOAL:   Patient will meet greater than or equal to 90% of their needs  MONITOR:   PO intake, Supplement acceptance, Labs, I & O's  REASON FOR ASSESSMENT:   Malnutrition Screening Tool    ASSESSMENT:   Past medical history including multiple myeloma. Diagnosis in 2016. Followed by Dr. [name] at [facility]. Patient presents w/ progressive dyspnea . CT C/A/P shows progression of involvement from MM as stated in image report below. Concerning are the pleural effusions and cardiac involvement   Spoke with pt, family at bedside. Wesley [name] is a pleasant 70 yo male who suffers from multiple myeloma w/ mets. He states that he had a good appetite at home, but that he gets full quickly. Stated he "eats half of what he has" at breakfast, lunch, and dinner normally. He also has ensure at home that he has "drank 2 bottles of." He endorses weight loss, with a usual weight of 189#. He is 184 today for a 5#/2.6% severe wt loss in 1 week.  Nutrition-Focused physical exam completed. Findings are no fat depletion, moderate muscle depletion, and no edema.   He is s/p systematic chemo w/ velade/revlimid/decadron 11/26, Systematic chemo with carfilzomib/cytoxan/decadron 3/1 Began systematic chemo with pomalyst/decadron/carfilzomb 4 days ago.  Labs: Na 126, Cl 94, BUN 34, Cr 2.79, EGFR 25 Medications: Decadron  Diet Order:  Diet Heart Room service appropriate?: Yes; Fluid consistency:: Thin  Skin:  Reviewed, no issues  Last BM:  PTA  Height:   Ht Readings from Last 1  Encounters:  07/03/15 '5\' 8"'$  (1.727 m)    Weight:   Wt Readings from Last 1 Encounters:  07/09/15 184 lb 4.9 oz (83.6 kg)    Ideal Body Weight:  70 kg  BMI:  Body mass index is 28.03 kg/(m^2).  Estimated Nutritional Needs:   Kcal:  2000-2500  Protein:  100-125  Fluid:  >/= 2L  EDUCATION NEEDS:   No education needs identified at this time  Wesley [name]. Wesley Schmelzer, MS, RD LDN After Hours/Weekend Pager [phone]

## 2015-07-10 ENCOUNTER — Inpatient Hospital Stay (HOSPITAL_COMMUNITY): Payer: Medicare HMO

## 2015-07-10 ENCOUNTER — Ambulatory Visit
Admit: 2015-07-10 | Discharge: 2015-07-10 | Disposition: A | Discharge status: Home / Self Care | Payer: Medicare HMO | Attending: Radiation Oncology | Admitting: Radiation Oncology

## 2015-07-10 ENCOUNTER — Ambulatory Visit: Payer: Medicare HMO

## 2015-07-10 ENCOUNTER — Other Ambulatory Visit: Payer: Medicare HMO

## 2015-07-10 DIAGNOSIS — C9000 Multiple myeloma not having achieved remission: Secondary | ICD-10-CM

## 2015-07-10 DIAGNOSIS — Z9889 Other specified postprocedural states: Secondary | ICD-10-CM | POA: Insufficient documentation

## 2015-07-10 LAB — BASIC METABOLIC PANEL
ANION GAP: 12 (ref 5–15)
BUN: 43 mg/dL — ABNORMAL HIGH (ref 6–20)
CALCIUM: 8.7 mg/dL — AB (ref 8.9–10.3)
CHLORIDE: 91 mmol/L — AB (ref 101–111)
CO2: 22 mmol/L (ref 22–32)
CREATININE: 2.82 mg/dL — AB (ref 0.61–1.24)
GFR calc non Af Amer: 22 mL/min — ABNORMAL LOW (ref >60)
GFR, EST AFRICAN AMERICAN: 25 mL/min — AB (ref >60)
Glucose, Bld: 114 mg/dL — ABNORMAL HIGH (ref 65–99)
Potassium: 4.6 mmol/L (ref 3.5–5.1)
SODIUM: 125 mmol/L — AB (ref 135–145)

## 2015-07-10 MED ORDER — HYDRALAZINE HCL 50 MG PO TABS
50.0000 mg | ORAL_TABLET | Freq: Three times a day (TID) | ORAL | Status: DC
  Administered 2015-07-10 – 2015-07-18 (×24): 50 mg via ORAL
  Filled 2015-07-10 (×25): qty 1

## 2015-07-10 MED ORDER — BISACODYL 10 MG RE SUPP
10.0000 mg | Freq: Every day | RECTAL | Status: DC | PRN
  Filled 2015-07-10: qty 1

## 2015-07-10 MED ORDER — AMLODIPINE BESYLATE 5 MG PO TABS
5.0000 mg | ORAL_TABLET | Freq: Every day | ORAL | Status: DC
  Administered 2015-07-10 – 2015-07-18 (×9): 5 mg via ORAL
  Filled 2015-07-10 (×10): qty 1

## 2015-07-10 MED ORDER — POLYETHYLENE GLYCOL 3350 17 G PO PACK
17.0000 g | PACK | Freq: Every day | ORAL | Status: DC
  Administered 2015-07-10 – 2015-07-11 (×2): 17 g via ORAL
  Filled 2015-07-10 (×2): qty 1

## 2015-07-10 MED ORDER — ALLOPURINOL 100 MG PO TABS
100.0000 mg | ORAL_TABLET | Freq: Every day | ORAL | Status: DC
  Administered 2015-07-10 – 2015-07-18 (×9): 100 mg via ORAL
  Filled 2015-07-10 (×9): qty 1

## 2015-07-10 MED ORDER — SODIUM CHLORIDE 0.9% FLUSH
10.0000 mL | INTRAVENOUS | Status: DC | PRN
Start: 2015-07-10 — End: 2015-07-18

## 2015-07-10 MED ORDER — SENNA 8.6 MG PO TABS
2.0000 | ORAL_TABLET | Freq: Every day | ORAL | Status: DC
  Administered 2015-07-10 – 2015-07-11 (×2): 17.2 mg via ORAL
  Filled 2015-07-10 (×2): qty 2

## 2015-07-10 MED ORDER — SODIUM CHLORIDE 0.9% FLUSH
10.0000 mL | Freq: Two times a day (BID) | INTRAVENOUS | Status: DC
  Administered 2015-07-10 – 2015-07-17 (×10): 10 mL

## 2015-07-10 MED ORDER — ALBUTEROL SULFATE (2.5 MG/3ML) 0.083% IN NEBU
2.5000 mg | INHALATION_SOLUTION | RESPIRATORY_TRACT | Status: DC | PRN
  Administered 2015-07-13 – 2015-07-17 (×4): 2.5 mg via RESPIRATORY_TRACT
  Filled 2015-07-10 (×4): qty 3

## 2015-07-10 NOTE — Progress Notes (Signed)
[facility]  S: Feeling better today, no cough or mucus production  O:  Filed Vitals:   07/10/15 0000 07/10/15 0400 07/10/15 0510 07/10/15 0800  BP:   139/93 137/99  Pulse:   75 110  Temp: 98.2 F (36.8 C) 98.4 F (36.9 C)  98.3 F (36.8 C)  TempSrc: Oral Oral  Oral  Resp:      Weight:      SpO2:   96% 98%   RA  Gen: well appearing HENT: OP clear, neck supple PULM: Diminished left base, normal percussion CV: RRR, no mgr, trace edema GI: BS+, soft, nontender Derm: no cyanosis or rash Psyche: normal mood and affect   Prelim call from pathology> fluid looks like it has myeloma involvement.  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  He will need a Pleur-ex but he is a bit resistant to "having holes punched in him".  Will order CXR to see if fluid has recurred.  I tried to call his wife to discuss but she was not available at both numbers I called.  I would favor a pleur-ex as the fluid will very likely recur and give him symptoms.  Roselie Awkward, MD [place] Pager: [phone] Cell: [phone] After 3pm or if no response, call [phone]

## 2015-07-10 NOTE — Progress Notes (Addendum)
PROGRESS NOTE    Wesley [name]  TDD:[id number]  DOB: [birth year]  DOA: 07/07/2015 PCP: [name], MD Outpatient Specialists: Oncology: Dr. 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 [name] who presently is scheduled to start chemotherapy (3rd line therapy due to disease progression) with Carfilzomib, Pomalyst and Decadron presented with progressive dyspnea over the past 1-2 days PTA. Workup in the emergency department showed bilateral pleural effusions on chest x-ray. CT of the chest, abdomen and pelvis on 07/07/2015 revealed a moderate left and large right pleural effusion and several masses in the chest and abdomen concerning for multiple myeloma progression.   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 - Hypoxia resolved.  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 - CCM recommends Pleurx but patient is assistant to it at this time. Follow up repeat chest x-ray. - Doubt pneumonia and hence agree with discontinuing IV Rocephin and azithromycin on 3/8.  Progressive multiple myeloma - CT chest, abdomen and pelvis show several masses concerning for myeloma progression. - Oncology input 3/7 appreciated: Continue steroids, radiation oncology consulted for palliative radiation to address enlarging masses and oncology will consider transferring to [facility] as outpatient. - 2-D echo: EF 65-70 percent, grade 1 diastolic dysfunction, no wall motion abnormality and trivial pericardial  effusion (CT had suggested mass effect on pericardium)  Hyponatremia - Likely multifactorial - Serum osmolarity: 268 and urine osmolarity: 216. - Unclear etiology-? Some relationship to hyperuricemia. Stable and asymptomatic of CNS symptoms.  Hyperuricemia - Uric acid is 12. Consider starting allopurinol.  Acute on stage III chronic kidney disease - Baseline creatinine 2.1-2.5 - Now may have worsened due to diuretics. Creatinine stable in the 2.7-2.8 over the last 3 days. Continue oral Lasix.  Hyperkalemia - Resolved.  Essential hypertension - Uncontrolled. Increase hydralazine to prior home dose of 50 MG 3 times a day and resume amlodipine 5 MG daily.  Right hydroureteronephrosis - Some improvement on CT. 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  Constipation - Started bowel regimen.  Anemia of chronic disease/malignancy and chemotherapy - Stable.   DVT prophylaxis: SCDs Code Status: Full Family Communication: Discussed with patient's spouse on 07/10/15. Disposition Plan: Transfer to medical floor 3/8. DC home when medically stable.   Consultants:  Pulmonary and critical care medicine  Medical oncology  Radiation oncology  Procedures:  Right thoracentesis on 07/07/15  2-D echo 07/07/15: Study Conclusions  - Left ventricle: The cavity size was normal. Systolic function was  vigorous. The estimated ejection fraction was in the range of 65%  to 70%. Wall motion was normal; there were no regional wall  motion abnormalities. Doppler parameters are consistent with  abnormal left ventricular relaxation (grade 1 diastolic  dysfunction). - Left atrium: The atrium was mildly dilated. - Pericardium, extracardiac: A trivial pericardial effusion was  identified. Features were not consistent with tamponade  physiology. There was a moderate-sized left pleural effusion.  Antimicrobials:  Azithromycin 3/5 > 3/8  IV Rocephin 3/5 > 3/8    Subjective: Denies dyspnea, cough or chest pain. Constipation +. Passing flatus.  Last p.m. 6-7 days ago. No nausea or vomiting.  Objective: Filed Vitals:   07/10/15 0000 07/10/15 0400 07/10/15 0510 07/10/15 0800  BP:   139/93 137/99  Pulse:   75 110  Temp: 98.2 F (36.8 C) 98.4 F (36.9 C)  98.3 F (36.8 C)  TempSrc: Oral Oral  Oral  Resp:      Weight:      SpO2:   96% 98%    Intake/Output Summary (Last 24 hours) at 07/10/15 1206 Last data filed at 07/10/15 0817  Gross per 24 hour  Intake    262 ml  Output   1560 ml  Net  -1298 ml   Filed Weights   07/08/15 0423 07/09/15 0500  Weight: 88.4 kg (194 lb 14.2 oz) 83.6 kg (184 lb 4.9 oz)    Exam:  General exam: Pleasant middle-aged male lying comfortably propped up in bed this morning. Respiratory system: Slightly diminished breath sounds in the bases but otherwise clear to auscultation. No increased work of breathing. Cardiovascular system: S1 & S2 heard, RRR. No JVD, murmurs, gallops, clicks or pedal edema. Not on telemetry.  Gastrointestinal system: Abdomen is nondistended, soft and nontender. Normal bowel sounds heard. Central nervous system: Alert and oriented. No focal neurological deficits. Extremities: Symmetric 5 x 5 power.   Data Reviewed: Basic Metabolic Panel:  Recent Labs Lab 07/07/15 0910 07/07/15 1349 07/08/15 0400 07/09/15 0400 07/10/15 0505  NA 126* 123* 122* 126* 125*  K 5.2* 5.5* 5.4* 4.7 4.6  CL 98* 95* 94* 94* 91*  CO2 19* 17* 19* 22 22  GLUCOSE 111* 134* 130* 130* 114*  BUN 17 17 23* 34* 43*  CREATININE 2.52* 2.39* 2.81* 2.79* 2.82*  CALCIUM 9.1 8.9 8.9 9.0 8.7*   Liver Function Tests:  Recent Labs Lab 07/07/15 0910 07/09/15 0400  AST 20 21  ALT 13* 14*  ALKPHOS 40 36*  BILITOT 1.0 0.6  PROT 9.1* 9.0*  ALBUMIN 3.7 3.5   No results for input(s): LIPASE, AMYLASE in the last 168 hours. No results for input(s): AMMONIA in the last 168 hours. CBC:  Recent Labs Lab  07/07/15 0910 07/07/15 1349 07/08/15 0400 07/09/15 0400  WBC 4.0 3.3* 6.2 6.6  NEUTROABS 2.6  --   --   --   HGB 9.2* 9.4* 8.9* 8.7*  HCT 27.9* 28.1* 26.9* 26.0*  MCV 95.2 95.3 93.4 93.5  PLT 234 196 202 215   Cardiac Enzymes: No results for input(s): CKTOTAL, CKMB, CKMBINDEX, TROPONINI in the last 168 hours. BNP (last 3 results) No results for input(s): PROBNP in the last 8760 hours. CBG: No results for input(s): GLUCAP in the last 168 hours.  Recent Results (from the past 240 hour(s))  Culture, blood (Routine X 2) w Reflex to ID Panel     Status: None (Preliminary result)   Collection Time: 07/07/15  9:31 AM  Result Value Ref Range Status   Specimen Description BLOOD RIGHT CHEST  Final   Special Requests BOTTLES DRAWN AEROBIC AND ANAEROBIC 5 CC  Final   Culture   Final    NO GROWTH 2 DAYS Performed at [facility]    Report Status PENDING  Incomplete  Culture, blood (Routine X 2) w Reflex to ID Panel     Status: None (Preliminary result)   Collection Time: 07/07/15  9:41 AM  Result Value Ref Range Status   Specimen Description BLOOD RIGHT ANTECUBITAL  Final   Special Requests BOTTLES DRAWN AEROBIC AND ANAEROBIC 5 CC  Final   Culture   Final    NO GROWTH 2 DAYS Performed at [facility]: [facility]    Report Status PENDING  Incomplete  MRSA PCR Screening     Status: None   Collection Time: 07/07/15  1:09 PM  Result Value Ref Range Status   MRSA by PCR NEGATIVE NEGATIVE Final    Comment:        The GeneXpert MRSA Assay (FDA approved for NASAL specimens only), is one component of a comprehensive MRSA colonization surveillance program. It is not intended to diagnose MRSA infection nor to guide or monitor treatment for MRSA infections.          Studies: Dg Chest Port 1 View  07/09/2015  CLINICAL DATA:  Follow-up pleural effusion, respiratory failure, metastatic malignancy, acute and chronic renal failure. EXAM: PORTABLE CHEST 1 VIEW COMPARISON:  Portable  chest x-ray of July 07, 2015 FINDINGS: The small pleural effusion on the right is again demonstrated. Inflation of the right lung is improved. There is soft tissue density noted along the lateral thoracic wall with loss of the contour of the right seventh rib. The left lung is well-expanded. Increased density at the left lung base persists. There is a small left pleural effusion. The cardiac silhouette remains enlarged. The pulmonary vascularity is not engorged. The Port-A-Cath appliance tip projects over the midportion of the SVC. There are surgical clips overlying the left lateral chest wall. IMPRESSION: Stable appearance of small bilateral pleural effusions. Left-sided basilar atelectasis is somewhat less conspicuous. Lysis of the lateral aspect of the right seventh rib is better demonstrated today. Electronically Signed   By: David  Martinique M.D.   On: 07/09/2015 07:05    07/07/15, CT chest, abdomen and pelvis without contrast  IMPRESSION: 1. Small to moderate left and large right pleural effusion. Proportional bilateral volume loss as a result. These are new from 03/20/2015. 2. Mass effect on the anterior heart border may be hemodynamically significant. The jugular veins appear more prominent than they did previously. 3. Bulky multinodular soft tissue mass interposed between the anterior chest wall and anterior pericardium causing mass effect, likely reflecting extensive mediastinal adenopathy. There is similar fullness in the superior mediastinum which likely reflects a combination of venous engorgement and adenopathy. 4. Right chest wall mass persists but is smaller when compared to prior study. 5. Market enlargement of left periaortic mass inseparable from left kidney. This causes significant mass effect upon the left kidney as well as on the tail of the pancreas and stomach. 6. Evidence of new mesenteric adenopathy and peritoneal implants as well as inguinal adenopathy. 7. Severe diffuse  bladder wall thickening. Lobulated inferior posterior bladder mass again identified. 8. Moderate right hydroureteronephrosis slightly less pronounced when compared to the prior study.    Scheduled Meds: . amLODipine  5 mg Oral Daily  . dexamethasone  4 mg Intravenous 4 times per day  . feeding supplement (ENSURE ENLIVE)  237 mL Oral BID BM  . finasteride  5 mg Oral Daily  . furosemide  40 mg Oral Daily  . hydrALAZINE  50 mg Oral TID  . polyethylene glycol  17 g Oral Daily  . senna  2 tablet Oral Daily  . sodium chloride flush  3 mL Intravenous Q12H  . tamsulosin  0.4 mg Oral BID   Continuous Infusions:   Active Problems:   Dyspnea   Pleural effusion   Metastatic cancer ([place])   Acute respiratory failure with hypoxia (HCC)   Malignant pleural effusion  Acute renal failure superimposed on stage 3 chronic kidney disease ([place])   Multiple myeloma ([place])    Time spent: 25 minutes.    Vernell Leep, MD, FACP, FHM. [facility] Pager [phone]  If 7PM-7AM, please contact night-coverage [email or web address] Password [email or web address] 07/10/2015, 12:06 PM    LOS: 3 days

## 2015-07-10 NOTE — Progress Notes (Signed)
  Radiation Oncology         [phone] ________________________________  Name: Arpan Arnwine MRN: [id number]  Date: 07/10/2015  DOB: [birth year]  Simulation Verification Note    ICD-9-CM ICD-10-CM   1. 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 The clinical setup was verified. Then port films were obtained and uploaded to the radiation oncology medical record software.  The treatment beams were carefully compared against the planned radiation fields. The position location and shape of the radiation fields was reviewed. They targeted volume of tissue appears to be appropriately covered by the radiation beams. Organs at risk appear to be excluded as planned.  Based on my personal review, I approved the simulation verification. The patient's treatment will proceed as planned.  -----------------------------------  Blair Promise, PhD, MD

## 2015-07-10 NOTE — Progress Notes (Signed)
Report called to [address] [facility] and patient transferred to 1324

## 2015-07-11 ENCOUNTER — Ambulatory Visit
Admit: 2015-07-11 | Discharge: 2015-07-11 | Disposition: A | Discharge status: Home / Self Care | Payer: Medicare HMO | Attending: Radiation Oncology | Admitting: Radiation Oncology

## 2015-07-11 ENCOUNTER — Encounter (HOSPITAL_COMMUNITY): Payer: Self-pay | Admitting: General Surgery

## 2015-07-11 ENCOUNTER — Ambulatory Visit: Payer: Medicare HMO

## 2015-07-11 DIAGNOSIS — E875 Hyperkalemia: Secondary | ICD-10-CM

## 2015-07-11 LAB — BASIC METABOLIC PANEL
ANION GAP: 10 (ref 5–15)
Anion gap: 13 (ref 5–15)
BUN: 66 mg/dL — ABNORMAL HIGH (ref 6–20)
BUN: 71 mg/dL — AB (ref 6–20)
CALCIUM: 8.9 mg/dL (ref 8.9–10.3)
CHLORIDE: 88 mmol/L — AB (ref 101–111)
CO2: 23 mmol/L (ref 22–32)
CO2: 24 mmol/L (ref 22–32)
Calcium: 9.3 mg/dL (ref 8.9–10.3)
Chloride: 90 mmol/L — ABNORMAL LOW (ref 101–111)
Creatinine, Ser: 3 mg/dL — ABNORMAL HIGH (ref 0.61–1.24)
Creatinine, Ser: 2.67 mg/dL — ABNORMAL HIGH (ref 0.61–1.24)
GFR calc Af Amer: 23 mL/min — ABNORMAL LOW (ref >60)
GFR calc non Af Amer: 20 mL/min — ABNORMAL LOW (ref >60)
GFR calc non Af Amer: 23 mL/min — ABNORMAL LOW (ref >60)
GFR, EST AFRICAN AMERICAN: 27 mL/min — AB (ref >60)
GLUCOSE: 119 mg/dL — AB (ref 65–99)
Glucose, Bld: 118 mg/dL — ABNORMAL HIGH (ref 65–99)
POTASSIUM: 4.5 mmol/L (ref 3.5–5.1)
Potassium: 5.3 mmol/L — ABNORMAL HIGH (ref 3.5–5.1)
SODIUM: 127 mmol/L — AB (ref 135–145)
Sodium: 121 mmol/L — ABNORMAL LOW (ref 135–145)

## 2015-07-11 LAB — CBC
HCT: 26 % — ABNORMAL LOW (ref 39.0–52.0)
Hemoglobin: 8.7 g/dL — ABNORMAL LOW (ref 13.0–17.0)
MCH: 31.4 pg (ref 26.0–34.0)
MCHC: 33.5 g/dL (ref 30.0–36.0)
MCV: 93.9 fL (ref 78.0–100.0)
Platelets: 216 10*3/uL (ref 150–400)
RBC: 2.77 MIL/uL — AB (ref 4.22–5.81)
RDW: 16.4 % — ABNORMAL HIGH (ref 11.5–15.5)
WBC: 7.2 10*3/uL (ref 4.0–10.5)

## 2015-07-11 MED ORDER — DEXAMETHASONE 4 MG PO TABS
4.0000 mg | ORAL_TABLET | Freq: Four times a day (QID) | ORAL | Status: DC
  Administered 2015-07-11 – 2015-07-18 (×26): 4 mg via ORAL
  Filled 2015-07-11 (×28): qty 1

## 2015-07-11 MED ORDER — SENNA 8.6 MG PO TABS
2.0000 | ORAL_TABLET | Freq: Two times a day (BID) | ORAL | Status: DC
  Administered 2015-07-11 – 2015-07-18 (×14): 17.2 mg via ORAL
  Filled 2015-07-11 (×14): qty 2

## 2015-07-11 MED ORDER — SODIUM POLYSTYRENE SULFONATE 15 GM/60ML PO SUSP
30.0000 g | Freq: Once | ORAL | Status: AC
  Administered 2015-07-11: 30 g via ORAL
  Filled 2015-07-11: qty 120

## 2015-07-11 MED ORDER — POLYETHYLENE GLYCOL 3350 17 G PO PACK
17.0000 g | PACK | Freq: Two times a day (BID) | ORAL | Status: DC
  Administered 2015-07-11 – 2015-07-18 (×12): 17 g via ORAL
  Filled 2015-07-11 (×13): qty 1

## 2015-07-11 MED ORDER — MILK AND MOLASSES ENEMA
1.0000 | Freq: Once | RECTAL | Status: AC
  Administered 2015-07-11: 250 mL via RECTAL
  Filled 2015-07-11: qty 250

## 2015-07-11 NOTE — Consult Note (Signed)
Chief Complaint: recurrent right malignant pleural effusion  Referring Physician:Dr. Simonne Maffucci  Supervising Physician: Arne Cleveland  HPI: Wesley [name] is an 70 y.o. male who has an aggressive form of multiple myeloma who has developed a malignant recurrent right pleural effusion.  CCM did a thoracentesis on him on 3/5 and got 1600cc of cloudy serosang fluid.  It was sent for cytology and did confirm some malignant cells.  Due to the recurrence of this effusion a request has been made for a pleurX catheter to be placed to assist with management.  Past Medical History:  Past Medical History  Diagnosis Date  . Pulmonary nodule, right     W/  CHEST WALL MASS--  SCHEDULED FOR BIOPSY W/ DR [name] ON FRIDAY 10-27-2013  . History of seizures as a child     AGE 61 OR 8--- NONE ISSUE SINCE  . History of GI bleed     SECONDARY TO ULCER --  DEC 2011  RESOLVED PER LAST EGD 07-05-2010  . Bladder tumor   . Hematuria   . Radiation 12/07/13-12/27/13    right anterior chest 37.5 gray  . Fracture of radius 09/29/14    left  . Cancer ([place])     multiple myeloma, bladder cancer 2015  . Encounter for antineoplastic chemotherapy 05/12/2015    Past Surgical History:  Past Surgical History  Procedure Laterality Date  . Excision benign left axilla tumor  1993  . Esophagogastroduodenoscopy  X5   LAST ONE 07-05-2010  . Transurethral resection of bladder tumor with gyrus (turbt-gyrus) N/A 10/25/2013    Procedure: TRANSURETHRAL RESECTION OF BLADDER TUMOR WITH GYRUS (TURBT-GYRUS);  Surgeon: Sharyn Creamer, MD;  Location: [facility];  Service: Urology;  Laterality: N/A;  . Cystoscopy with biopsy N/A 10/25/2013    Procedure: CYSTOSCOPY;  Surgeon: Sharyn Creamer, MD;  Location: [facility];  Service: Urology;  Laterality: N/A;  . Bone biopsy Right 11/14/13     right seventh rib lesion  . Orif radial fracture Left 10/22/2014    Procedure: Intramedullary nailing left  radius fracture;  Surgeon: Milly Jakob, MD;  Location: [place];  Service: Orthopedics;  Laterality: Left;  OPEN TREATMENT LEFT RADIUS FRACTURE  . Cystoscopy with retrograde pyelogram, ureteroscopy and stent placement N/A 02/21/2015    Procedure: CYSTOSCOPY, EXAM UNDER ANESTHESIA, FOLEY CATHETER PLACEMENT;  Surgeon: Festus Aloe, MD;  Location: [facility];  Service: Urology;  Laterality: N/A;    Family History:  Family History  Problem Relation Age of Onset  . Heart disease Mother   . Cancer - Prostate Brother     Social History:  reports that he quit smoking about 46 years ago. His smoking use included Cigarettes. He has a .1 pack-year smoking history. He has never used smokeless tobacco. He reports that he does not drink alcohol or use illicit drugs.  Allergies: No Known Allergies  Medications:   Medication List    ASK your doctor about these medications        acetaminophen 325 MG tablet  Commonly known as:  TYLENOL  Take 2 tablets (650 mg total) by mouth every 4 (four) hours as needed for mild pain, fever or headache.     amLODipine 5 MG tablet  Commonly known as:  NORVASC  Take 1 tablet (5 mg total) by mouth daily.     dexamethasone 4 MG tablet  Commonly known as:  DECADRON  10 tablet by mouth every week starting with the  first dose of chemotherapy.     finasteride 5 MG tablet  Commonly known as:  PROSCAR  Take 1 tablet (5 mg total) by mouth daily.     hydrALAZINE 50 MG tablet  Commonly known as:  APRESOLINE  Take 1 tablet (50 mg total) by mouth every 8 (eight) hours.     HYDROcodone-acetaminophen 5-325 MG tablet  Commonly known as:  NORCO/VICODIN  Take 1-2 tablets by mouth every 4 (four) hours as needed for moderate pain.     lactose free nutrition Liqd  Take 237 mLs by mouth 2 (two) times daily.     lidocaine-prilocaine cream  Commonly known as:  EMLA  Apply 1 application topically as needed.     pomalidomide 3 MG capsule  Commonly  known as:  POMALYST  Take 1 capsule (3 mg total) by mouth daily. Take with water on days 1-21. Repeat every 28 days.     PRESCRIPTION MEDICATION  CHEMO- [facility]     prochlorperazine 10 MG tablet  Commonly known as:  COMPAZINE  Take 1 tablet (10 mg total) by mouth every 6 (six) hours as needed for nausea or vomiting.     tamsulosin 0.4 MG Caps capsule  Commonly known as:  FLOMAX  Take 1 capsule (0.4 mg total) by mouth daily.        Please HPI for pertinent positives, otherwise complete 10 system ROS negative.  Mallampati Score:    Physical Exam: BP 131/84 mmHg  Pulse 120  Temp(Src) 98.5 F (36.9 C) (Oral)  Resp 20  Ht 5' 8" (1.727 m)  Wt 181 lb 14.1 oz (82.5 kg)  BMI 27.66 kg/m2  SpO2 98% Body mass index is 27.66 kg/(m^2). General: pleasant, WD, WN black male who is laying in bed in NAD HEENT: head is normocephalic, atraumatic.  Sclera are noninjected.  PERRL.  Ears and nose without any masses or lesions.  Mouth is pink and moist Heart: regular, rate, and rhythm.  Normal s1,s2. No obvious murmurs, gallops, or rubs noted.  Palpable radial and pedal pulses bilaterally Lungs: CTAB, no wheezes, rhonchi, or rales noted.  Respiratory effort nonlabored Abd: soft, NT, ND, +BS, no masses, organomegaly.  Mass like nodule felt at umbilicus Psych: A&Ox3 with an appropriate affect.   Labs: Results for orders placed or performed during the hospital encounter of 07/07/15 (from the past 48 hour(s))  Basic metabolic panel     Status: Abnormal   Collection Time: 07/10/15  5:05 AM  Result Value Ref Range   Sodium 125 (L) 135 - 145 mmol/L   Potassium 4.6 3.5 - 5.1 mmol/L   Chloride 91 (L) 101 - 111 mmol/L   CO2 22 22 - 32 mmol/L   Glucose, Bld 114 (H) 65 - 99 mg/dL   BUN 43 (H) 6 - 20 mg/dL   Creatinine, Ser 2.82 (H) 0.61 - 1.24 mg/dL   Calcium 8.7 (L) 8.9 - 10.3 mg/dL   GFR calc non Af Amer 22 (L) >60 mL/min   GFR calc Af Amer 25 (L) >60 mL/min    Comment: (NOTE) The eGFR has been  calculated using the CKD EPI equation. This calculation has not been validated in all clinical situations. eGFR's persistently <60 mL/min signify possible Chronic Kidney Disease.    Anion gap 12 5 - 15  CBC     Status: Abnormal   Collection Time: 07/11/15  3:28 AM  Result Value Ref Range   WBC 7.2 4.0 - 10.5 K/uL   RBC 2.77 (L)  4.22 - 5.81 MIL/uL   Hemoglobin 8.7 (L) 13.0 - 17.0 g/dL   HCT 26.0 (L) 39.0 - 52.0 %   MCV 93.9 78.0 - 100.0 fL   MCH 31.4 26.0 - 34.0 pg   MCHC 33.5 30.0 - 36.0 g/dL   RDW 16.4 (H) 11.5 - 15.5 %   Platelets 216 150 - 400 K/uL  Basic metabolic panel     Status: Abnormal   Collection Time: 07/11/15  3:28 AM  Result Value Ref Range   Sodium 121 (L) 135 - 145 mmol/L   Potassium 5.3 (H) 3.5 - 5.1 mmol/L   Chloride 88 (L) 101 - 111 mmol/L   CO2 23 22 - 32 mmol/L   Glucose, Bld 119 (H) 65 - 99 mg/dL   BUN 66 (H) 6 - 20 mg/dL   Creatinine, Ser 3.00 (H) 0.61 - 1.24 mg/dL   Calcium 8.9 8.9 - 10.3 mg/dL   GFR calc non Af Amer 20 (L) >60 mL/min   GFR calc Af Amer 23 (L) >60 mL/min    Comment: (NOTE) The eGFR has been calculated using the CKD EPI equation. This calculation has not been validated in all clinical situations. eGFR's persistently <60 mL/min signify possible Chronic Kidney Disease.    Anion gap 10 5 - 15    Imaging: Dg Chest 2 View  07/10/2015  CLINICAL DATA:  Pleural effusion. EXAM: CHEST  2 VIEW COMPARISON:  July 09, 2015. FINDINGS: Stable cardiomediastinal silhouette. No pneumothorax is noted. Right internal jugular Port-A-Cath is unchanged with distal tip in expected position of the SVC. Mild bilateral pleural effusions are noted which are increased compared to prior exam. Bibasilar atelectasis or infiltrates are noted. Lytic destruction of lateral portion of right seventh rib is again noted. IMPRESSION: Mild bilateral pleural effusions are noted which are increased compared to prior exam. Bibasilar atelectasis or infiltrates are noted.  Electronically Signed   By: Marijo Conception, M.D.   On: 07/10/2015 15:34    Assessment/Plan 1. Malignant recurrent right pleural effusion -Dr. Vernard Gambles has reviewed the patient's imaging.  We will try to proceed with placement tomorrow.  If the patient does not have enough fluid then we won't be able to place the catheter tomorrow.  This was d/w the patient and he understands this. -Risks and Benefits discussed with the patient including, but not limited to bleeding, hemoptysis, respiratory failure requiring intubation, infection, pneumothorax requiring chest tube placement. All of the patient's questions were answered, patient is agreeable to proceed. Consent signed and in chart.   Thank you for this interesting consult.  I greatly enjoyed meeting Wesley [name] and look forward to participating in their care.  A copy of this report was sent to the requesting provider on this date.  Electronically Signed: Henreitta Cea 07/11/2015, 2:15 PM   I spent a total of 40 Minutes    in face to face in clinical consultation, greater than 50% of which was counseling/coordinating care for recurrent malignant right pleural effusion

## 2015-07-11 NOTE — Care Management Important Message (Signed)
Important Message  Patient Details  Name: Francis Krygowski MRN: [id number] Date of Birth: Mar 19, 1946   Medicare Important Message Given:  Yes    Camillo Flaming 07/11/2015, 12:40 [place] Message  Patient Details  Name: Juan Dorsainvil MRN: [id number] Date of Birth: 04/01/1946   Medicare Important Message Given:  Yes    Camillo Flaming 07/11/2015, 12:40 PM

## 2015-07-11 NOTE — Progress Notes (Signed)
PROGRESS NOTE    Wesley [name]  VEH:[id number]  DOB: 07/28/1945  DOA: 07/07/2015 PCP: [name], MD Outpatient Specialists: Oncology: Dr. 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 [name] who presently is scheduled to start chemotherapy (3rd line therapy due to disease progression) with Carfilzomib, Pomalyst and Decadron presented with progressive dyspnea over the past 1-2 days PTA. Workup in the emergency department showed bilateral pleural effusions on chest x-ray. CT of the chest, abdomen and pelvis on 07/07/2015 revealed a moderate left and large right pleural effusion and several masses in the chest and abdomen concerning for multiple myeloma progression. Right therapeutic and diagnostic thoracentesis on 3/5 with resolution of hypoxia and improvement of symptoms. Concern for reaccumulation of malignant pleural effusion. Pulmonary consulted IR but vision may not have much reaccumulation for Pleurx placement now and may need to follow-up with pulmonology as outpatient with weekly chest x-rays. Radiation oncology started radiation. Palliative care consulted for goals of care. Patient transferred to medical floor on 3/8.  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 - Hypoxia resolved.  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 He and his wife have discussed with me and pulmonologist and at this time he is agreeable to proceed with Pleurx. IR consulted for same. However at this time patient may not have  enough pleural fluid for Pleurx catheter placement. Unless he has significant reaccumulation prior to discharge, he may have to follow up as an outpatient with weekly chest x-rays with pulmonology and consider Pleurx catheter at that time. Discussed with Dr. [name], pulmonology on 3/9. Chest x-ray shows worsening pleural effusions. - Doubt pneumonia and hence agree with discontinued IV Rocephin and azithromycin on 3/8. - Discussed with Dr. [name], oncologist on 3/9. He indicated that patient has aggressive form of multiple myeloma and recommends palliative care consulted for goals of care. He will follow-up in the office regarding further chemotherapy and referral to [facility]. 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  Progressive multiple myeloma - CT chest, abdomen and pelvis show several masses concerning for myeloma progression. - Oncology input 3/7 appreciated: Continue steroids, radiation oncology consulted for palliative radiation to address enlarging masses and oncology will consider transferring to [facility] as outpatient. - 2-D echo: EF 65-70 percent, grade 1 diastolic dysfunction, no wall motion abnormality and trivial pericardial effusion (CT had suggested mass effect on pericardium) - Discussed with Dr. [name], oncologist on 3/9. He indicated that patient has aggressive form of multiple myeloma and recommends palliative care consulted for goals of care. He will follow-up in the office regarding further chemotherapy and referral to [facility].  Hyponatremia - Likely multifactorial - Serum osmolarity: 268 and urine osmolarity: 216. - Unclear etiology-? Some relationship to hyperuricemia. Asymptomatic of CNS symptoms. - Sodium is again dropped to 121 on 3/19. Clinically euvolemic. DC ongoing IV normal  saline and hold Lasix. Follow BMP closely.  Hyperkalemia, mild, recurrent - Potassium 5.3 on 3/9. May be secondary  to worsening of acute kidney injury. DC Lasix. Provided a dose of Kayexalate. Follow BMP closely. Change diet to renal diet.  Hyperuricemia - Uric acid is 12. Started allopurinol on 3/8 .  Acute on stage III chronic kidney disease - Baseline creatinine 2.1-2.5 - Now may have worsened due to diuretics. Creatinine was stable in the 2.7-2.8 over the last 3 days.  - However creatinine on 3/9 has increased to 3. Hold Lasix. Encouraged oral fluid intake. Follow BMP closely.  Essential hypertension - Uncontrolled. Increased hydralazine to prior home dose of 50 MG 3 times a day and resumed amlodipine 5 MG daily. - Improved   Right hydroureteronephrosis - Some improvement on CT. 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  Constipation - Started bowel regimen. No BM yet. If no BM by later today despite senna, MiraLAX and a dose of Kayexalate, may consider enema.   Anemia of chronic disease/malignancy and chemotherapy - Stable.   DVT prophylaxis: SCDs Code Status: Full Family Communication: Discussed with patient's spouse at bedside on 07/11/15. Disposition Plan: Transfer to medical floor 3/8. DC home when medically stable.   Consultants:  Pulmonary and critical care medicine  Medical oncology  Radiation oncology  Palliative care medicine   Procedures:  Right thoracentesis on 07/07/15  2-D echo 07/07/15: Study Conclusions  - Left ventricle: The cavity size was normal. Systolic function was  vigorous. The estimated ejection fraction was in the range of 65%  to 70%. Wall motion was normal; there were no regional wall  motion abnormalities. Doppler parameters are consistent with  abnormal left ventricular relaxation (grade 1 diastolic  dysfunction). - Left atrium: The atrium was mildly dilated. - Pericardium, extracardiac: A trivial  pericardial effusion was  identified. Features were not consistent with tamponade  physiology. There was a moderate-sized left pleural effusion.  Antimicrobials:  Azithromycin 3/5 > 3/8  IV Rocephin 3/5 > 3/8   Subjective: Denies dyspnea, cough or chest pain. Constipation +. Still no BM despite bowel regimen. No abdominal pain.   Objective: Filed Vitals:   07/10/15 2048 07/11/15 0521 07/11/15 0954 07/11/15 1010  BP: 118/80 138/95 125/81 125/81  Pulse: 110 114 116   Temp: 97.6 F (36.4 C) 98.5 F (36.9 C)    TempSrc: Oral Oral    Resp: 18 18    Height: '5\' 8"'$  (1.727 m)     Weight: 82.5 kg (181 lb 14.1 oz) 82.5 kg (181 lb 14.1 oz)    SpO2: 97% 97% 99%     Intake/Output Summary (Last 24 hours) at 07/11/15 1205 Last data filed at 07/11/15 1100  Gross per 24 hour  Intake    600 ml  Output    600 ml  Net      0 ml   Filed Weights   07/09/15 0500 07/10/15 2048 07/11/15 0521  Weight: 83.6 kg (184 lb 4.9 oz) 82.5 kg (181 lb 14.1 oz) 82.5 kg (181 lb 14.1 oz)    Exam:  General exam: Pleasant middle-aged male lying comfortably propped up in bed this morning. Respiratory system: Slightly diminished breath sounds in the bases but otherwise clear to auscultation. No increased work of breathing. Cardiovascular system: S1 & S2 heard, RRR. No JVD, murmurs, gallops, clicks or pedal edema.  Gastrointestinal system: Abdomen is nondistended, soft and nontender. Normal bowel sounds heard. Central nervous system: Alert and oriented. No focal neurological deficits. Extremities: Symmetric 5 x 5 power.   Data Reviewed: Basic Metabolic Panel:  Recent Labs Lab 07/07/15 1349  07/08/15 0400 07/09/15 0400 07/10/15 0505 07/11/15 0328  NA 123* 122* 126* 125* 121*  K 5.5* 5.4* 4.7 4.6 5.3*  CL 95* 94* 94* 91* 88*  CO2 17* 19* '22 22 23  '$ GLUCOSE 134* 130* 130* 114* 119*  BUN 17 23* 34* 43* 66*  CREATININE 2.39* 2.81* 2.79* 2.82* 3.00*  CALCIUM 8.9 8.9 9.0 8.7* 8.9   Liver Function  Tests:  Recent Labs Lab 07/07/15 0910 07/09/15 0400  AST 20 21  ALT 13* 14*  ALKPHOS 40 36*  BILITOT 1.0 0.6  PROT 9.1* 9.0*  ALBUMIN 3.7 3.5   No results for input(s): LIPASE, AMYLASE in the last 168 hours. No results for input(s): AMMONIA in the last 168 hours. CBC:  Recent Labs Lab 07/07/15 0910 07/07/15 1349 07/08/15 0400 07/09/15 0400 07/11/15 0328  WBC 4.0 3.3* 6.2 6.6 7.2  NEUTROABS 2.6  --   --   --   --   HGB 9.2* 9.4* 8.9* 8.7* 8.7*  HCT 27.9* 28.1* 26.9* 26.0* 26.0*  MCV 95.2 95.3 93.4 93.5 93.9  PLT 234 196 202 215 216   Cardiac Enzymes: No results for input(s): CKTOTAL, CKMB, CKMBINDEX, TROPONINI in the last 168 hours. BNP (last 3 results) No results for input(s): PROBNP in the last 8760 hours. CBG: No results for input(s): GLUCAP in the last 168 hours.  Recent Results (from the past 240 hour(s))  Culture, blood (Routine X 2) w Reflex to ID Panel     Status: None (Preliminary result)   Collection Time: 07/07/15  9:31 AM  Result Value Ref Range Status   Specimen Description BLOOD RIGHT CHEST  Final   Special Requests BOTTLES DRAWN AEROBIC AND ANAEROBIC 5 CC  Final   Culture   Final    NO GROWTH 3 DAYS Performed at [facility]    Report Status PENDING  Incomplete  Culture, blood (Routine X 2) w Reflex to ID Panel     Status: None (Preliminary result)   Collection Time: 07/07/15  9:41 AM  Result Value Ref Range Status   Specimen Description BLOOD RIGHT ANTECUBITAL  Final   Special Requests BOTTLES DRAWN AEROBIC AND ANAEROBIC 5 CC  Final   Culture   Final    NO GROWTH 3 DAYS Performed at [facility]    Report Status PENDING  Incomplete  MRSA PCR Screening     Status: None   Collection Time: 07/07/15  1:09 PM  Result Value Ref Range Status   MRSA by PCR NEGATIVE NEGATIVE Final    Comment:        The GeneXpert MRSA Assay (FDA approved for NASAL specimens only), is one component of a comprehensive MRSA colonization surveillance  program. It is not intended to diagnose MRSA infection nor to guide or monitor treatment for MRSA infections.          Studies: Dg Chest 2 View  07/10/2015  CLINICAL DATA:  Pleural effusion. EXAM: CHEST  2 VIEW COMPARISON:  July 09, 2015. FINDINGS: Stable cardiomediastinal silhouette. No pneumothorax is noted. Right internal jugular Port-A-Cath is unchanged with distal tip in expected position of the SVC. Mild bilateral pleural effusions are noted which are increased compared to prior exam. Bibasilar atelectasis or infiltrates are noted. Lytic destruction of lateral portion of right seventh rib is again noted. IMPRESSION: Mild bilateral pleural effusions are noted which are increased compared to prior exam. Bibasilar atelectasis or infiltrates are noted. Electronically Signed   By: Marijo Conception, M.D.  On: 07/10/2015 15:34    07/07/15, CT chest, abdomen and pelvis without contrast  IMPRESSION: 1. Small to moderate left and large right pleural effusion. Proportional bilateral volume loss as a result. These are new from 03/20/2015. 2. Mass effect on the anterior heart border may be hemodynamically significant. The jugular veins appear more prominent than they did previously. 3. Bulky multinodular soft tissue mass interposed between the anterior chest wall and anterior pericardium causing mass effect, likely reflecting extensive mediastinal adenopathy. There is similar fullness in the superior mediastinum which likely reflects a combination of venous engorgement and adenopathy. 4. Right chest wall mass persists but is smaller when compared to prior study. 5. Market enlargement of left periaortic mass inseparable from left kidney. This causes significant mass effect upon the left kidney as well as on the tail of the pancreas and stomach. 6. Evidence of new mesenteric adenopathy and peritoneal implants as well as inguinal adenopathy. 7. Severe diffuse bladder wall thickening.  Lobulated inferior posterior bladder mass again identified. 8. Moderate right hydroureteronephrosis slightly less pronounced when compared to the prior study.    Scheduled Meds: . allopurinol  100 mg Oral Daily  . amLODipine  5 mg Oral Daily  . dexamethasone  4 mg Intravenous 4 times per day  . feeding supplement (ENSURE ENLIVE)  237 mL Oral BID BM  . finasteride  5 mg Oral Daily  . hydrALAZINE  50 mg Oral TID  . polyethylene glycol  17 g Oral Daily  . senna  2 tablet Oral Daily  . sodium chloride flush  10-40 mL Intracatheter Q12H  . sodium chloride flush  3 mL Intravenous Q12H  . tamsulosin  0.4 mg Oral BID   Continuous Infusions:   Active Problems:   Dyspnea   Pleural effusion   Metastatic cancer ([place])   Acute respiratory failure with hypoxia (HCC)   Malignant pleural effusion   Acute renal failure superimposed on stage 3 chronic kidney disease ([place])   Multiple myeloma (HCC)   Status post thoracentesis    Time spent: 25 minutes.    Vernell Leep, MD, FACP, FHM. [facility] Pager [phone]  If 7PM-7AM, please contact night-coverage [email or web address] Password [email or web address] 07/11/2015, 12:05 PM    LOS: 4 days

## 2015-07-11 NOTE — Progress Notes (Signed)
[facility] PCCM  S:    O:  Filed Vitals:   07/10/15 2048 07/11/15 0521 07/11/15 0954 07/11/15 1010  BP: 118/80 138/95 125/81 125/81  Pulse: 110 114 116   Temp: 97.6 F (36.4 C) 98.5 F (36.9 C)    TempSrc: Oral Oral    Resp: 18 18    Height: 5\' 8"  (1.727 m)     Weight: 181 lb 14.1 oz (82.5 kg) 181 lb 14.1 oz (82.5 kg)    SpO2: 97% 97% 99%    RA  Gen: well appearing in NAD HENT: OP clear, neck supple PULM: even/non-labored, lungs bilaterally clear CV: RRR, no mgr, trace LE edema GI: BS+, soft, nontender Derm: no cyanosis or rash Psyche: normal mood and affect   Pathology Pleural Fluid 3/5 >> myeloma involvement.  Cells are positive with the plasma cell marker CD138 and show kappa light chain restriction with in situ hybridization. The cells are negative with cytokeratin AE1/AE3, cytokeratin 5/6, WT-1 and calretinin.   Impression/Plan: Doubt pneumonia - stop antibiotics  Acute Hypoxic Respiratory Failure - resolved.  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   Rare cause but no other clear cause of the effusion.  He will need a PleurX catheter at some point.  IR has been consulted for evaluation.  However, repeat CXR at this point does not likely have enough pleural fluid for insertion.    Plan: If unable to place PleurX per IR while inpatient, he can follow up with [facility] at discharge for tracking of effusion with potential placement with reaccumulation. Trend serial CXR's.  Repeat in am 3/10 Pulmonary hygiene - IS, mobilize   Noe Gens, NP-C [facility] Pgr: [phone] or if no answer [phone] 07/11/2015, 11:21 AM

## 2015-07-11 NOTE — Progress Notes (Signed)
Pt had very minimal results after  M&M enema.

## 2015-07-12 ENCOUNTER — Ambulatory Visit
Admit: 2015-07-12 | Discharge: 2015-07-12 | Disposition: A | Discharge status: Home / Self Care | Payer: Medicare HMO | Attending: Radiation Oncology | Admitting: Radiation Oncology

## 2015-07-12 ENCOUNTER — Telehealth: Payer: Self-pay | Admitting: Internal Medicine

## 2015-07-12 ENCOUNTER — Inpatient Hospital Stay (HOSPITAL_COMMUNITY): Payer: Medicare HMO

## 2015-07-12 DIAGNOSIS — Z7189 Other specified counseling: Secondary | ICD-10-CM | POA: Insufficient documentation

## 2015-07-12 DIAGNOSIS — Z515 Encounter for palliative care: Secondary | ICD-10-CM | POA: Insufficient documentation

## 2015-07-12 LAB — BASIC METABOLIC PANEL
Anion gap: 12 (ref 5–15)
BUN: 71 mg/dL — AB (ref 6–20)
CALCIUM: 8.9 mg/dL (ref 8.9–10.3)
CHLORIDE: 87 mmol/L — AB (ref 101–111)
CO2: 24 mmol/L (ref 22–32)
CREATININE: 2.95 mg/dL — AB (ref 0.61–1.24)
GFR calc non Af Amer: 21 mL/min — ABNORMAL LOW (ref >60)
GFR, EST AFRICAN AMERICAN: 24 mL/min — AB (ref >60)
Glucose, Bld: 94 mg/dL (ref 65–99)
Potassium: 4.1 mmol/L (ref 3.5–5.1)
Sodium: 123 mmol/L — ABNORMAL LOW (ref 135–145)

## 2015-07-12 LAB — CBC
HEMATOCRIT: 25 % — AB (ref 39.0–52.0)
HEMOGLOBIN: 8.6 g/dL — AB (ref 13.0–17.0)
MCH: 31 pg (ref 26.0–34.0)
MCHC: 34.4 g/dL (ref 30.0–36.0)
MCV: 90.3 fL (ref 78.0–100.0)
Platelets: 176 10*3/uL (ref 150–400)
RBC: 2.77 MIL/uL — ABNORMAL LOW (ref 4.22–5.81)
RDW: 16.7 % — AB (ref 11.5–15.5)
WBC: 7 10*3/uL (ref 4.0–10.5)

## 2015-07-12 LAB — OSMOLALITY: Osmolality: 302 mOsm/kg — ABNORMAL HIGH (ref 275–295)

## 2015-07-12 LAB — CULTURE, BLOOD (ROUTINE X 2)
CULTURE: NO GROWTH
Culture: NO GROWTH

## 2015-07-12 LAB — OSMOLALITY, URINE: OSMOLALITY UR: 416 mosm/kg (ref 300–900)

## 2015-07-12 MED ORDER — FLEET ENEMA 7-19 GM/118ML RE ENEM
1.0000 | ENEMA | Freq: Once | RECTAL | Status: AC
  Administered 2015-07-12: 1 via RECTAL
  Filled 2015-07-12: qty 1

## 2015-07-12 MED ORDER — SODIUM CHLORIDE 0.9 % IV SOLN
INTRAVENOUS | Status: DC
  Administered 2015-07-12: 1000 mL via INTRAVENOUS
  Administered 2015-07-13: 06:00:00 via INTRAVENOUS

## 2015-07-12 NOTE — Progress Notes (Signed)
Verbal order from Dr. Algis Liming received- NS at 75 cc /hr x 24 hours.

## 2015-07-12 NOTE — Progress Notes (Signed)
Patient had a small amount of soft BM after fleet enema.

## 2015-07-12 NOTE — Progress Notes (Signed)
PROGRESS NOTE    Wesley [name]  ZHG:[id number]  DOB: Jan 10, 1946  DOA: 07/07/2015 PCP: [name], MD Outpatient Specialists: Oncology: 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 [name] who presently is scheduled to start chemotherapy (3rd line therapy due to disease progression) with Carfilzomib, Pomalyst and Decadron presented with progressive dyspnea over the past 1-2 days PTA. Workup in the emergency department showed bilateral pleural effusions on chest x-ray. CT of the chest, abdomen and pelvis on 07/07/2015 revealed a moderate left and large right pleural effusion and several masses in the chest and abdomen concerning for multiple myeloma progression. Right therapeutic and diagnostic thoracentesis on 3/5 with resolution of hypoxia and improvement of symptoms. Concern for reaccumulation of malignant pleural effusion. Pulmonary consulted IR but vision may not have much reaccumulation for Pleurx placement now and may need to follow-up with pulmonology as outpatient with weekly chest x-rays. Radiation oncology started radiation. Palliative care consulted for goals of care. Patient transferred to medical floor on 3/8.  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 - Hypoxia resolved.  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 He and his wife have discussed with me and pulmonologist and at this time he is agreeable to proceed with Pleurx. IR consulted for same. However at this time patient may not have  enough pleural fluid for Pleurx catheter placement. Unless he has significant reaccumulation prior to discharge, he may have to follow up as an outpatient with weekly chest x-rays with pulmonology and consider Pleurx catheter at that time. Discussed with Dr. [name], pulmonology on 3/9. Chest x-ray shows worsening pleural effusions. - Doubt pneumonia and hence agree with discontinued IV Rocephin and azithromycin on 3/8. - Discussed with Dr. [name], oncologist on 3/9. He indicated that patient has aggressive form of multiple myeloma and recommends palliative care consulted for goals of care. He will follow-up in the office regarding further chemotherapy and referral to [facility]. 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 - As per IR, not enough pleural fluid to place Pleurx catheter. Outpatient follow-up with pulmonology.  Progressive multiple myeloma - CT chest, abdomen and pelvis show several masses concerning for myeloma progression. - Oncology input 3/7 appreciated: Continue steroids, radiation oncology consulted for palliative radiation to address enlarging masses and oncology will consider transferring to [facility] as outpatient. - 2-D echo: EF 65-70 percent, grade 1 diastolic dysfunction, no wall motion abnormality and trivial pericardial effusion (CT had suggested mass effect on pericardium) - Discussed with Dr. [name], oncologist on 3/9. He indicated that patient has aggressive form of multiple myeloma and recommends palliative care consulted for goals of care. He will follow-up in the office regarding further chemotherapy and referral to [facility].  Hyponatremia - Likely multifactorial - Serum osmolarity: 268 and urine osmolarity: 216. - Unclear etiology-? Some relationship to hyperuricemia. Asymptomatic of  CNS symptoms. - Sodium is again dropped to 121 on 3/19. Clinically euvolemic. DC ongoing IV normal saline and hold  Lasix. Follow BMP closely. - Recurrent hyponatremia. Sodium 123.? Related to chronic kidney disease. Plan to discuss with nephrology.? Repeat serum and urine osmolarity.  Hyperkalemia, mild, recurrent - Potassium 5.3 on 3/9. May be secondary to worsening of acute kidney injury. DC Lasix. Provided a dose of Kayexalate. Change diet to renal diet. - Resolved.  Hyperuricemia - Uric acid is 12. Started allopurinol on 3/8 .  Acute on stage III chronic kidney disease - Baseline creatinine 2.1-2.5 - Now may have worsened due to diuretics. Creatinine was stable in the 2.7-2.8 over the last 3 days.  - However creatinine on 3/9 has increased to 3. Hold Lasix. Encouraged oral fluid intake. Stable creatinine  Essential hypertension - Uncontrolled. Increased hydralazine to prior home dose of 50 MG 3 times a day and resumed amlodipine 5 MG daily. - Improved   Right hydroureteronephrosis - Some improvement on CT. 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  Constipation - Started bowel regimen.  - Had small BM after enema on 07/11/15. Check KUB  Anemia of chronic disease/malignancy and chemotherapy - Stable.   DVT prophylaxis: SCDs Code Status: Full Family Communication: Discussed with patient's spouse at bedside on 07/11/15. Disposition Plan: Transfer to medical floor 3/8. DC home when medically stable.   Consultants:  Pulmonary and critical care medicine  Medical oncology  Radiation oncology  Palliative care medicine   Procedures:  Right thoracentesis on 07/07/15  2-D echo 07/07/15: Study Conclusions  - Left ventricle: The cavity size was normal. Systolic function was  vigorous. The estimated ejection fraction was in the range of 65%  to 70%. Wall motion was normal; there were no regional wall  motion abnormalities. Doppler parameters are  consistent with  abnormal left ventricular relaxation (grade 1 diastolic  dysfunction). - Left atrium: The atrium was mildly dilated. - Pericardium, extracardiac: A trivial pericardial effusion was  identified. Features were not consistent with tamponade  physiology. There was a moderate-sized left pleural effusion.  Antimicrobials:  Azithromycin 3/5 > 3/8  IV Rocephin 3/5 > 3/8   Subjective: States that he had a small BM after an enema yesterday. Denies dyspnea.  Objective: Filed Vitals:   07/11/15 2105 07/12/15 0512 07/12/15 1115 07/12/15 1258  BP: 133/92 133/78 128/78 140/82  Pulse: 117 118  120  Temp: 98.2 F (36.8 C) 98.1 F (36.7 C)  98.9 F (37.2 C)  TempSrc: Oral Oral  Oral  Resp: '18 18  16  '$ Height:      Weight:      SpO2: 99% 95%  96%    Intake/Output Summary (Last 24 hours) at 07/12/15 1533 Last data filed at 07/12/15 0546  Gross per 24 hour  Intake    505 ml  Output    400 ml  Net    105 ml   Filed Weights   07/09/15 0500 07/10/15 2048 07/11/15 0521  Weight: 83.6 kg (184 lb 4.9 oz) 82.5 kg (181 lb 14.1 oz) 82.5 kg (181 lb 14.1 oz)    Exam:  General exam: Pleasant middle-aged male lying comfortably propped up in bed this morning. Respiratory system: Slightly diminished breath sounds in the bases but otherwise clear to auscultation. No increased work of breathing. Cardiovascular system: S1 & S2 heard, RRR. No JVD, murmurs, gallops, clicks or pedal edema.  Gastrointestinal system: Abdomen is mildly distended, soft and nontender. Normal bowel sounds heard. Central nervous system: Alert and oriented. No focal neurological deficits. Extremities: Symmetric 5 x 5 power.   Data Reviewed: Basic  Metabolic Panel:  Recent Labs Lab 07/09/15 0400 07/10/15 0505 07/11/15 0328 07/11/15 1710 07/12/15 0540  NA 126* 125* 121* 127* 123*  K 4.7 4.6 5.3* 4.5 4.1  CL 94* 91* 88* 90* 87*  CO2 '22 22 23 24 24  '$ GLUCOSE 130* 114* 119* 118* 94  BUN 34* 43* 66*  71* 71*  CREATININE 2.79* 2.82* 3.00* 2.67* 2.95*  CALCIUM 9.0 8.7* 8.9 9.3 8.9   Liver Function Tests:  Recent Labs Lab 07/07/15 0910 07/09/15 0400  AST 20 21  ALT 13* 14*  ALKPHOS 40 36*  BILITOT 1.0 0.6  PROT 9.1* 9.0*  ALBUMIN 3.7 3.5   No results for input(s): LIPASE, AMYLASE in the last 168 hours. No results for input(s): AMMONIA in the last 168 hours. CBC:  Recent Labs Lab 07/07/15 0910 07/07/15 1349 07/08/15 0400 07/09/15 0400 07/11/15 0328 07/12/15 0540  WBC 4.0 3.3* 6.2 6.6 7.2 7.0  NEUTROABS 2.6  --   --   --   --   --   HGB 9.2* 9.4* 8.9* 8.7* 8.7* 8.6*  HCT 27.9* 28.1* 26.9* 26.0* 26.0* 25.0*  MCV 95.2 95.3 93.4 93.5 93.9 90.3  PLT 234 196 202 215 216 176   Cardiac Enzymes: No results for input(s): CKTOTAL, CKMB, CKMBINDEX, TROPONINI in the last 168 hours. BNP (last 3 results) No results for input(s): PROBNP in the last 8760 hours. CBG: No results for input(s): GLUCAP in the last 168 hours.  Recent Results (from the past 240 hour(s))  Culture, blood (Routine X 2) w Reflex to ID Panel     Status: None   Collection Time: 07/07/15  9:31 AM  Result Value Ref Range Status   Specimen Description BLOOD RIGHT CHEST  Final   Special Requests BOTTLES DRAWN AEROBIC AND ANAEROBIC 5 CC  Final   Culture   Final    NO GROWTH 5 DAYS Performed at [facility]    Report Status 07/12/2015 FINAL  Final  Culture, blood (Routine X 2) w Reflex to ID Panel     Status: None   Collection Time: 07/07/15  9:41 AM  Result Value Ref Range Status   Specimen Description BLOOD RIGHT ANTECUBITAL  Final   Special Requests BOTTLES DRAWN AEROBIC AND ANAEROBIC 5 CC  Final   Culture   Final    NO GROWTH 5 DAYS Performed at [facility]    Report Status 07/12/2015 FINAL  Final  MRSA PCR Screening     Status: None   Collection Time: 07/07/15  1:09 PM  Result Value Ref Range Status   MRSA by PCR NEGATIVE NEGATIVE Final    Comment:        The GeneXpert MRSA Assay  (FDA approved for NASAL specimens only), is one component of a comprehensive MRSA colonization surveillance program. It is not intended to diagnose MRSA infection nor to guide or monitor treatment for MRSA infections.          Studies: Dg Chest 2 View  07/12/2015  CLINICAL DATA:  History of myeloma and bladder cancer. Evaluate bilateral pleural effusions EXAM: CHEST  2 VIEW COMPARISON:  07/10/2015; 07/07/2015; chest CT - 07/07/2015 FINDINGS: Grossly unchanged cardiac silhouette and mediastinal contours with persistent thickening of the bilateral paratracheal stripe secondary to known mediastinal lymphadenopathy. Small right and trace left-sided pleural effusions are unchanged. Unchanged bibasilar heterogeneous/consolidative opacities. No new focal airspace opacities. No definite evidence of edema. Surgical clips overlie the left axilla. No acute osseus abnormalities. IMPRESSION: 1. Grossly unchanged  small right and trace left-sided pleural effusions with associated bibasilar opacities, likely atelectasis. 2. Similar findings compatible with known mediastinal adenopathy. Electronically Signed   By: Sandi Mariscal M.D.   On: 07/12/2015 10:27   Ir Fluoro Rm 30-60 Min  07/12/2015  INDICATION: History of multiple myeloma, post right-sided thoracentesis on 07/07/2014 which yielded approximately 1.6 L of serosanguineous fluid. Cytologic analysis confirmed the presence of malignant cells. As such, request made for placement of an ultrasound and fluoroscopic guided right-sided pleural its catheter for palliative purposes. EXAM: IR FLOURO RM 0-60 MIN COMPARISON:  Chest radiograph - earlier same day; 07/10/2015; 07/09/2015; 07/07/2015; chest CT - 07/07/2015 MEDICATIONS: None. CONTRAST:  None FLUOROSCOPY TIME:  None COMPLICATIONS: None immediate TECHNIQUE: Patient was positioned supine on the fluoroscopy table and preprocedural ultrasound scanning was performed of the right lateral chest. Images were reviewed  and the decision was made not to proceed with either ultrasound-guided thoracentesis or tunneled pleural drainage catheter placement FINDINGS: Preprocedural ultrasound scanning of the right chest demonstrated only a trace/small amount of right-sided pleural fluid, not enough to warrant either ultrasound-guided thoracentesis or tunneled pleural drainage catheter at the present time. Additionally, upon questioning of the patient, he denies shortness of breath or chest discomfort. IMPRESSION: Trace/small amount of right-sided pleural fluid, not enough to warrant either ultrasound-guided thoracentesis for tunneled pleural drainage catheter placement. PLAN: Pending the rapidity of pleural fluid reaccumulation, would consider proceeding with therapeutic ultrasound guided thoracentesis prior to subjecting the patient to placement of a palliative tunneled pleural catheter. Electronically Signed   By: Sandi Mariscal M.D.   On: 07/12/2015 12:38    07/07/15, CT chest, abdomen and pelvis without contrast  IMPRESSION: 1. Small to moderate left and large right pleural effusion. Proportional bilateral volume loss as a result. These are new from 03/20/2015. 2. Mass effect on the anterior heart border may be hemodynamically significant. The jugular veins appear more prominent than they did previously. 3. Bulky multinodular soft tissue mass interposed between the anterior chest wall and anterior pericardium causing mass effect, likely reflecting extensive mediastinal adenopathy. There is similar fullness in the superior mediastinum which likely reflects a combination of venous engorgement and adenopathy. 4. Right chest wall mass persists but is smaller when compared to prior study. 5. Market enlargement of left periaortic mass inseparable from left kidney. This causes significant mass effect upon the left kidney as well as on the tail of the pancreas and stomach. 6. Evidence of new mesenteric adenopathy and  peritoneal implants as well as inguinal adenopathy. 7. Severe diffuse bladder wall thickening. Lobulated inferior posterior bladder mass again identified. 8. Moderate right hydroureteronephrosis slightly less pronounced when compared to the prior study.    Scheduled Meds: . allopurinol  100 mg Oral Daily  . amLODipine  5 mg Oral Daily  . dexamethasone  4 mg Oral 4 times per day  . feeding supplement (ENSURE ENLIVE)  237 mL Oral BID BM  . finasteride  5 mg Oral Daily  . hydrALAZINE  50 mg Oral TID  . polyethylene glycol  17 g Oral BID  . senna  2 tablet Oral BID  . sodium chloride flush  10-40 mL Intracatheter Q12H  . sodium chloride flush  3 mL Intravenous Q12H  . tamsulosin  0.4 mg Oral BID   Continuous Infusions:   Active Problems:   Dyspnea   Pleural effusion   Metastatic cancer (HCC)   Acute respiratory failure with hypoxia (HCC)   Malignant pleural effusion   Acute renal  failure superimposed on stage 3 chronic kidney disease ([place])   Multiple myeloma (HCC)   Status post thoracentesis    Time spent: 25 minutes.    Vernell Leep, MD, FACP, FHM. [facility] Pager [phone]  If 7PM-7AM, please contact night-coverage [email or web address] Password [email or web address] 07/12/2015, 3:33 PM    LOS: 5 days

## 2015-07-12 NOTE — Progress Notes (Signed)
Md notified on the results of portable x ray of abdomen, order received for Fleet enema x 1. This order was administered. Will continue to monitor.

## 2015-07-12 NOTE — Telephone Encounter (Signed)
Faxed pt medical records to [facility] [phone] °

## 2015-07-12 NOTE — Consult Note (Signed)
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  He has been thru 2 regimens of chemo - changed due to disease progression, and is now on a 3rd regimen of therapy.  He presented to the hospital on 3/5 with progressive dyspnea and was found to have a malignant pleural effusion.  He was evaluated for pleurx catheter but it was not done as there was not enough fluid present at the time of the procedure.  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  He lives independently with his wife and they have 2 grown children and 5 grandchildren with one on the way.  He has lost 20 lbs unintentionally in the last 9 months, but still is very functional with all of his own ADLs.  Mr. Asman is a primary care patient of Dr. Burnice Logan, but says he rarely ever went to the doctor other than for DOT physicals until he developed right rib pain last summer.  His wife encouraged him to see Dr. Raliegh Ip which eventually resulted in a bx and the diagnosis of cancer.  He was still feeling well and was surprised when Dr. [name] recommended chemotherapy.  A short while after that he broke his wrist while mowing the lawn on his riding mower.  He was surprised when he was told the fracture was due to the cancer.   In short he feels the treatment he has received is causing his worsening disease and is suspicious of the medical system.    We touched on Hospice services.  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  He stated  he did not want to be a burden to his wife and children at the end of life and would consider utilizing a hospice home.  Interestingly, he also commented that 2 of his brothers had hospice services in their home when they passed and he felt like Hospice hastened the death of both of them.      We discussed code status.  Wesley [name] favored a partial code without chest compressions or intubation, but Wesley [name] was confident that he wanted a full code status with chest compressions.  He does not want long term life support.  "I would want them to try a couple of times".    Wesley [name] is very interested in pursuing all treatment options - chemo, radiation, blood transfusions, a second opinion - what ever it takes.  The Matthies's are planners.  They already have their burial plot purchased, and [name] has already written her Iver Nestle - her goal is to save her children the stress of doing these things.  She is working on writing Wesley [name] and he is supportive of her efforts.  However, they are not under the impression that these obituaries will be used for a long long time.  In the same vain I presented advanced care planning and a living will.  They are interested in completing a living will.  I gave them the paper work.   [name] was pleased that a chaplain would help them  fill it out and commented that " they will have my best interest at heart".  Today he complains primarily of right inguinal pain.  His wife queries if this is due to his hernia or to the spreading masses in the same area.  She asks me privately "what stage is my husband's cancer - can I talk to Dr. Arbutus Ped about how sick he really is?"   Contacts/Participants in Discussion:  Patient and his wife. Primary Decision Maker: Patient HCPOA: Wife   SUMMARY OF RECOMMENDATIONS  Code Status/Advance Care Planning: Full code  Symptom Management:    the patient's symptoms are well managed by the primary team  Additional Recommendations  (Limitations, Scope, Preferences):  Full Scope Treatment   Psycho-social/Spiritual:  Support System: Adequate Desire for further Chaplaincy support: yes   Prognosis: If his disease continues at its current rate of progression, then less than 6 months.  Discharge Planning: Home with Home Health   Chief Complaint/ Primary Diagnoses: Present on Admission:  . Dyspnea . Pleural effusion . Metastatic cancer (HCC)  I have reviewed the medical record, interviewed the patient and family, and examined the patient. The following aspects are pertinent.  Past Medical History  Diagnosis Date  . Pulmonary nodule, right     W/  CHEST WALL MASS--  SCHEDULED FOR BIOPSY W/ DR Kendrick Fries ON FRIDAY 10-27-2013  . History of seizures as a child     AGE 58 OR 8--- NONE ISSUE SINCE  . History of GI bleed     SECONDARY TO ULCER --  DEC 2011  RESOLVED PER LAST EGD 07-05-2010  . Bladder tumor   . Hematuria   . Radiation 12/07/13-12/27/13    right anterior chest 37.5 gray  . Fracture of radius 09/29/14    left  . Cancer (HCC)     multiple myeloma, bladder cancer 2015  . Encounter for antineoplastic chemotherapy 05/12/2015   Social History   Social History  . Marital Status: Married    Spouse Name: N/A  . Number of Children: 2  . Years of Education: N/A   Occupational History  . retired    Social History Main Topics  . Smoking status: Former Smoker -- 0.10 packs/day for 1 years    Types: Cigarettes    Quit date: 05/04/1969  . Smokeless tobacco: Never Used  . Alcohol Use: No  . Drug Use: No  . Sexual Activity: Not Asked   Other Topics Concern  . None   Social History Narrative   Family History  Problem Relation Age of Onset  . Heart disease Mother   . Cancer - Prostate Brother    Scheduled Meds: . allopurinol  100 mg Oral Daily  . amLODipine  5 mg Oral Daily  . dexamethasone  4 mg Oral 4 times per day  . feeding supplement (ENSURE ENLIVE)  237 mL Oral BID BM  . finasteride  5 mg  Oral Daily  . hydrALAZINE  50 mg Oral TID  . polyethylene glycol  17 g Oral BID  . senna  2 tablet Oral BID  . sodium chloride flush  10-40 mL Intracatheter Q12H  . sodium chloride flush  3 mL Intravenous Q12H  . tamsulosin  0.4 mg Oral BID   Continuous Infusions: . sodium chloride 1,000 mL (07/12/15 1621)   PRN Meds:.albuterol, bisacodyl, hydrALAZINE, HYDROcodone-acetaminophen, ondansetron **OR** [DISCONTINUED] ondansetron (ZOFRAN) IV, sodium chloride flush Medications Prior to Admission:  Prior to Admission medications   Medication Sig Start Date End Date Taking?  Authorizing Provider  acetaminophen (TYLENOL) 325 MG tablet Take 2 tablets (650 mg total) by mouth every 4 (four) hours as needed for mild pain, fever or headache. 03/25/15  Yes Robbie Lis, MD  amLODipine (NORVASC) 5 MG tablet Take 1 tablet (5 mg total) by mouth daily. 06/10/15  Yes Marletta Lor, MD  dexamethasone (DECADRON) 4 MG tablet 10 tablet by mouth every week starting with the first dose of chemotherapy. Patient taking differently: Take 40 mg by mouth once a week. starting with the first dose of chemotherapy. 04/02/15  Yes [name], MD  finasteride (PROSCAR) 5 MG tablet Take 1 tablet (5 mg total) by mouth daily. 02/23/15  Yes Theodis Blaze, MD  hydrALAZINE (APRESOLINE) 50 MG tablet Take 1 tablet (50 mg total) by mouth every 8 (eight) hours. 05/08/15  Yes Marletta Lor, MD  HYDROcodone-acetaminophen (NORCO/VICODIN) 5-325 MG tablet Take 1-2 tablets by mouth every 4 (four) hours as needed for moderate pain. 02/23/15  Yes Theodis Blaze, MD  lactose free nutrition (BOOST) LIQD Take 237 mLs by mouth 2 (two) times daily.   Yes Historical Provider, MD  lidocaine-prilocaine (EMLA) cream Apply 1 application topically as needed. Patient taking differently: Apply 1 application topically as needed (for port).  06/05/15  Yes [name], MD  [place]   Yes Historical Provider, MD    prochlorperazine (COMPAZINE) 10 MG tablet Take 1 tablet (10 mg total) by mouth every 6 (six) hours as needed for nausea or vomiting. 04/08/15  Yes [name], MD  tamsulosin (FLOMAX) 0.4 MG CAPS capsule Take 1 capsule (0.4 mg total) by mouth daily. Patient taking differently: Take 0.4 mg by mouth 2 (two) times daily.  02/23/15  Yes Theodis Blaze, MD  pomalidomide (POMALYST) 3 MG capsule Take 1 capsule (3 mg total) by mouth daily. Take with water on days 1-21. Repeat every 28 days. Patient not taking: Reported on 07/07/2015 07/03/15   [name], MD   No Known Allergies  Review of Systems  Constitutional: Positive for activity change, appetite change, fatigue and unexpected weight change.  HENT: Positive for facial swelling.   Gastrointestinal: Positive for constipation and abdominal distention.  Endocrine: Negative.   Genitourinary: Negative.  Negative for difficulty urinating.  Musculoskeletal: Negative.   Skin: Negative.   Neurological: Negative.   Hematological: Positive for adenopathy.  Psychiatric/Behavioral: Negative.     Physical Exam  Pleasant well developed AA male, resting comfortably in bed. Soft swelling approximately 1"x2" on his forehead Chest asymmetrical with a port in his right chest and a lump in his left chest.  Becomes slightly short of breath when speaking Extremities:  No edema in LE.  Able to stand and walk.   Vital Signs: BP 114/66 mmHg  Pulse 120  Temp(Src) 98.9 F (37.2 C) (Oral)  Resp 16  Ht '5\' 8"'$  (1.727 m)  Wt 82.5 kg (181 lb 14.1 oz)  BMI 27.66 kg/m2  SpO2 96%  SpO2: SpO2: 96 % O2 Device:SpO2: 96 % O2 Flow Rate: .O2 Flow Rate (L/min): 2 L/min  IO: Intake/output summary:  Intake/Output Summary (Last 24 hours) at 07/12/15 1636 Last data filed at 07/12/15 0546  Gross per 24 hour  Intake    505 ml  Output    400 ml  Net    105 ml    LBM: Last BM Date: 07/08/15 Baseline Weight: Weight: 88.4 kg (194 lb 14.2 oz) Most recent weight:  Weight: 82.5 kg (181 lb 14.1 oz)  Palliative Assessment/Data:  Flowsheet Rows        Most Recent Value   Intake Tab    Referral Department  Hospitalist   Unit at Time of Referral  Oncology Unit   Palliative Care Primary Diagnosis  Cancer   Date Notified  07/11/15   Palliative Care Type  New Palliative care   Reason for referral  Clarify Goals of Care   Date of Admission  07/07/15   # of days IP prior to Palliative referral  4   Clinical Assessment    Palliative Performance Scale Score  60%   Pain Min Last 24 hours  4   Dyspnea Min Last 24 hours  4   Psychosocial & Spiritual Assessment    Palliative Care Outcomes    Patient/Family meeting held?  Yes   Who was at the meeting?  Patient and his wife   Palliative Care Outcomes  Provided advance care planning, Clarified goals of care      Additional Data Reviewed:  CBC:    Component Value Date/Time   WBC 7.0 07/12/2015 0540   WBC 4.9 07/03/2015 0936   HGB 8.6* 07/12/2015 0540   HGB 7.5* 07/03/2015 0936   HCT 25.0* 07/12/2015 0540   HCT 22.9* 07/03/2015 0936   PLT 176 07/12/2015 0540   PLT 223 07/03/2015 0936   MCV 90.3 07/12/2015 0540   MCV 99.9* 07/03/2015 0936   NEUTROABS 2.6 07/07/2015 0910   NEUTROABS 3.1 07/03/2015 0936   LYMPHSABS 0.8 07/07/2015 0910   LYMPHSABS 0.4* 07/03/2015 0936   MONOABS 0.5 07/07/2015 0910   MONOABS 1.2* 07/03/2015 0936   EOSABS 0.1 07/07/2015 0910   EOSABS 0.1 07/03/2015 0936   BASOSABS 0.0 07/07/2015 0910   BASOSABS 0.1 07/03/2015 0936   Comprehensive Metabolic Panel:    Component Value Date/Time   NA 123* 07/12/2015 0540   NA 131* 07/03/2015 0936   K 4.1 07/12/2015 0540   K 4.8 07/03/2015 0936   CL 87* 07/12/2015 0540   CO2 24 07/12/2015 0540   CO2 20* 07/03/2015 0936   BUN 71* 07/12/2015 0540   BUN 20.8 07/03/2015 0936   CREATININE 2.95* 07/12/2015 0540   CREATININE 2.7* 07/03/2015 0936   GLUCOSE 94 07/12/2015 0540   GLUCOSE 150* 07/03/2015 0936   CALCIUM 8.9  07/12/2015 0540   CALCIUM 9.1 07/03/2015 0936   AST 21 07/09/2015 0400   AST 14 07/03/2015 0936   ALT 14* 07/09/2015 0400   ALT 10 07/03/2015 0936   ALKPHOS 36* 07/09/2015 0400   ALKPHOS 44 07/03/2015 0936   BILITOT 0.6 07/09/2015 0400   BILITOT 1.04 07/03/2015 0936   PROT 9.0* 07/09/2015 0400   PROT 8.9* 07/03/2015 0936   ALBUMIN 3.5 07/09/2015 0400   ALBUMIN 3.3* 07/03/2015 0936     Time In: 2:00 Time Out: 4:00 Time Total: 120 Greater than 50%  of this time was spent counseling and coordinating care related to the above assessment and plan.  Signed by: Imogene Burn, PA-C Palliative Medicine Pager: [phone]  07/12/2015, 4:36 PM  Please contact Palliative Medicine Team phone at [phone] for questions and concerns.

## 2015-07-13 DIAGNOSIS — E871 Hypo-osmolality and hyponatremia: Secondary | ICD-10-CM | POA: Insufficient documentation

## 2015-07-13 DIAGNOSIS — K5641 Fecal impaction: Secondary | ICD-10-CM

## 2015-07-13 DIAGNOSIS — J90 Pleural effusion, not elsewhere classified: Secondary | ICD-10-CM | POA: Insufficient documentation

## 2015-07-13 DIAGNOSIS — K5900 Constipation, unspecified: Secondary | ICD-10-CM

## 2015-07-13 LAB — BASIC METABOLIC PANEL
ANION GAP: 11 (ref 5–15)
BUN: 76 mg/dL — ABNORMAL HIGH (ref 6–20)
CALCIUM: 9.2 mg/dL (ref 8.9–10.3)
CO2: 24 mmol/L (ref 22–32)
Chloride: 91 mmol/L — ABNORMAL LOW (ref 101–111)
Creatinine, Ser: 2.81 mg/dL — ABNORMAL HIGH (ref 0.61–1.24)
GFR, EST AFRICAN AMERICAN: 25 mL/min — AB (ref >60)
GFR, EST NON AFRICAN AMERICAN: 22 mL/min — AB (ref >60)
Glucose, Bld: 117 mg/dL — ABNORMAL HIGH (ref 65–99)
POTASSIUM: 4.5 mmol/L (ref 3.5–5.1)
Sodium: 126 mmol/L — ABNORMAL LOW (ref 135–145)

## 2015-07-13 MED ORDER — BISACODYL 10 MG RE SUPP: 10.0000 mg | Freq: Every day | RECTAL | Status: DC | PRN

## 2015-07-13 MED ORDER — SODIUM CHLORIDE 0.9 % IV SOLN
INTRAVENOUS | Status: AC
  Administered 2015-07-13: 19:00:00 via INTRAVENOUS

## 2015-07-13 NOTE — Progress Notes (Signed)
Patient ambulatory around entire length of unit twice, some dyspnea reported. O2 sats 97% on room air, heart tachy in 130's.

## 2015-07-13 NOTE — Progress Notes (Signed)
Daily Progress Note   Patient Name: Wesley [name]       Date: 07/13/2015 DOB: 06/11/1945  Age: 70 y.o. 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 He states he drinks a lot of water at home. He is getting NS IVF. He has hyponatremia. Discussed with patient about his Na levels. Patient will discuss further with Dr Algis Liming today about his fluid restriction.   Interval Events:  chaplain consult pending to get his advanced directives completed. His wife is not at the bedside.   Discussed with patient regarding his goals of care, if appropriate, he wishes to pursue further treatment, he wishes to undergo consultation at [facility] or [facility] for further treatment after this hospitalization. He states he does not want people to say, "ok, your time is getting shorter and shorter so we don't have nothing to help you."  Endorsed his wishes to continue to remain hopeful for recovery/stabilization with regards to his cancer.   Discussed with patient about expanding his bowel regimen.   Continue current treatment, patient on Norco for pain/dyspnea, consider regular PO OxyIR if pain/dyspnea worsens. At the moment, patient doesn't complain of pain or dyspnea.   Length of Stay: 6 days  Current Medications: Scheduled Meds:  . allopurinol  100 mg Oral Daily  . amLODipine  5 mg Oral Daily  . dexamethasone  4 mg  Oral 4 times per day  . feeding supplement (ENSURE ENLIVE)  237 mL Oral BID BM  . finasteride  5 mg Oral Daily  . hydrALAZINE  50 mg Oral TID  . polyethylene glycol  17 g Oral BID  . senna  2 tablet Oral BID  . sodium chloride flush  10-40 mL Intracatheter Q12H  . sodium chloride flush  3 mL Intravenous Q12H  . tamsulosin  0.4 mg Oral BID    Continuous Infusions: . sodium chloride      PRN Meds: albuterol, bisacodyl, bisacodyl, hydrALAZINE, HYDROcodone-acetaminophen, ondansetron **OR** [DISCONTINUED] ondansetron (  ZOFRAN) IV, sodium chloride flush  Physical Exam: Physical Exam             Age appropriate gentleman resting in chair Awake alert Mood congruent S1 S2 Diminished Abdomen soft ?some mild clubbing digits upper extremity No edema Non focal  Vital Signs: BP 116/67 mmHg  Pulse 104  Temp(Src) 98.1 F (36.7 C) (Oral)  Resp 16  Ht '5\' 8"'$  (1.727 m)  Wt 82.373 kg (181 lb 9.6 oz)  BMI 27.62 kg/m2  SpO2 98% SpO2: SpO2: 98 % O2 Device: O2 Device: Not Delivered O2 Flow Rate: O2 Flow Rate (L/min): 2 L/min  Intake/output summary:  Intake/Output Summary (Last 24 hours) at 07/13/15 0852 Last data filed at 07/13/15 4970  Gross per 24 hour  Intake 1641.25 ml  Output      0 ml  Net 1641.25 ml   LBM: Last BM Date: 07/12/15 Baseline Weight: Weight: 88.4 kg (194 lb 14.2 oz) Most recent weight: Weight: 82.373 kg (181 lb 9.6 oz)       Palliative Assessment/Data: Flowsheet Rows        Most Recent Value   Intake Tab    Referral Department  Hospitalist   Unit at Time of Referral  Oncology Unit   Palliative Care Primary Diagnosis  Cancer   Date Notified  07/11/15   Palliative Care Type  Return patient Palliative Care   Reason for referral  Non-pain Symptom, Clarify Goals of Care   Date of Admission  07/07/15   Date first seen by Palliative Care  07/12/15   # of days IP prior to Palliative referral  4   Clinical Assessment    Palliative Performance Scale Score  40%    Pain Max last 24 hours  5   Pain Min Last 24 hours  4   Dyspnea Max Last 24 Hours  4   Dyspnea Min Last 24 hours  3   Psychosocial & Spiritual Assessment    Palliative Care Outcomes    Patient/Family meeting held?  Yes   Who was at the meeting?  patient    Palliative Care Outcomes  Provided advance care planning, Clarified goals of care      Additional Data Reviewed: CBC    Component Value Date/Time   WBC 7.0 07/12/2015 0540   WBC 4.9 07/03/2015 0936   RBC 2.77* 07/12/2015 0540   RBC 2.29* 07/03/2015 0936   HGB 8.6* 07/12/2015 0540   HGB 7.5* 07/03/2015 0936   HCT 25.0* 07/12/2015 0540   HCT 22.9* 07/03/2015 0936   PLT 176 07/12/2015 0540   PLT 223 07/03/2015 0936   MCV 90.3 07/12/2015 0540   MCV 99.9* 07/03/2015 0936   MCH 31.0 07/12/2015 0540   MCH 32.7 07/03/2015 0936   MCHC 34.4 07/12/2015 0540   MCHC 32.8 07/03/2015 0936   RDW 16.7* 07/12/2015 0540   RDW 19.2* 07/03/2015 0936   LYMPHSABS 0.8 07/07/2015 0910   LYMPHSABS 0.4* 07/03/2015 0936   MONOABS 0.5 07/07/2015 0910   MONOABS 1.2* 07/03/2015 0936   EOSABS 0.1 07/07/2015 0910   EOSABS 0.1 07/03/2015 0936   BASOSABS 0.0 07/07/2015 0910   BASOSABS 0.1 07/03/2015 0936    CMP     Component Value Date/Time   NA 126* 07/13/2015 0600   NA 131* 07/03/2015 0936   K 4.5 07/13/2015 0600   K 4.8 07/03/2015 0936   CL 91* 07/13/2015 0600   CO2 24 07/13/2015 0600   CO2 20* 07/03/2015 0936   GLUCOSE 117*  07/13/2015 0600   GLUCOSE 150* 07/03/2015 0936   BUN 76* 07/13/2015 0600   BUN 20.8 07/03/2015 0936   CREATININE 2.81* 07/13/2015 0600   CREATININE 2.7* 07/03/2015 0936   CALCIUM 9.2 07/13/2015 0600   CALCIUM 9.1 07/03/2015 0936   PROT 9.0* 07/09/2015 0400   PROT 8.9* 07/03/2015 0936   ALBUMIN 3.5 07/09/2015 0400   ALBUMIN 3.3* 07/03/2015 0936   AST 21 07/09/2015 0400   AST 14 07/03/2015 0936   ALT 14* 07/09/2015 0400   ALT 10 07/03/2015 0936   ALKPHOS 36* 07/09/2015 0400   ALKPHOS 44 07/03/2015 0936    BILITOT 0.6 07/09/2015 0400   BILITOT 1.04 07/03/2015 0936   GFRNONAA 22* 07/13/2015 0600   GFRAA 25* 07/13/2015 0600       Problem List:  Patient Active Problem List   Diagnosis Date Noted  . Palliative care encounter   . Counseling regarding advanced directives and goals of care   . Status post thoracentesis   . Acute respiratory failure with hypoxia ([place]) 07/08/2015  . 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 Acute renal failure superimposed on stage 3 chronic kidney disease ([place]) 07/08/2015  . Multiple myeloma ([place])   . Dyspnea 07/07/2015  . Pleural effusion 07/07/2015  . Metastatic cancer ([place]) 07/07/2015  . Lymphadenopathy, supraclavicular 07/03/2015  . Encounter for antineoplastic chemotherapy 05/12/2015  . UTI (urinary tract infection) due to Enterococcus and pseudomonas  03/24/2015  . Sepsis due to Pseudomonas UTI and bacteremia ([place]) 03/24/2015  . Enlarged prostate with urinary obstruction 03/24/2015  . Essential hypertension   . 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 CKD (chronic kidney disease) stage 5, GFR less than 15 ml/min (HCC) 02/19/2015  . Antineoplastic chemotherapy induced anemia 02/19/2015  . Bilateral hydronephrosis 02/18/2015  . 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/2014     Palliative Care Assessment & Plan    1.Code Status:  Full code    Code Status Orders        Start     Ordered   07/07/15 1331  Full code   Continuous     07/07/15 1330    Code Status History    Date Active Date Inactive Code Status Order ID Comments User Context   03/20/2015  7:22 PM 03/25/2015  2:47 PM Full Code [id number]  Theodis Blaze, MD Inpatient   02/17/2015  4:52 PM 02/23/2015  4:19 PM Full Code [id number]  Theodis Blaze, MD Inpatient   10/22/2014  8:52 AM 10/22/2014  1:09 PM Full Code [id number]  Milly Jakob, MD Inpatient   10/02/2014  9:48 AM 10/03/2014  3:19 AM Full Code [id number]  Arne Cleveland, MD HOV   11/14/2013 10:03 AM 11/15/2013  3:30 AM Full Code [id number]   Markus Daft, MD HOV       2. Goals of Care/Additional Recommendations:     Limitations on Scope of Treatment: Full Scope Treatment  Desire for further Chaplaincy support:no  Psycho-social Needs: Caregiving  Support/Resources  3. Symptom Management:      1. As above.   4. Palliative Prophylaxis:   Bowel Regimen  5. Prognosis: Unable to determine  6. Discharge Planning:  pending hospital course   Care plan was discussed with  Patient.   Thank you for allowing the Palliative Medicine Team to assist in the care of this patient.   Time In:  8 Time Out:  825 Total Time  25 Prolonged Time Billed  no        [phone]  Loistine Chance, MD  07/13/2015, 8:52 AM  Please contact Palliative Medicine Team phone at [phone] for questions and concerns.

## 2015-07-13 NOTE — Progress Notes (Signed)
PROGRESS NOTE    Wesley [name]  RXV:[id number]  DOB: [birth year]  DOA: 07/07/2015 PCP: [name], MD Outpatient Specialists: Oncology: Dr. 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 [name] who presently is scheduled to start chemotherapy (3rd line therapy due to disease progression) with Carfilzomib, Pomalyst and Decadron presented with progressive dyspnea over the past 1-2 days PTA. Workup in the emergency department showed bilateral pleural effusions on chest x-ray. CT of the chest, abdomen and pelvis on 07/07/2015 revealed a moderate left and large right pleural effusion and several masses in the chest and abdomen concerning for multiple myeloma progression. Right therapeutic and diagnostic thoracentesis on 3/5 with resolution of hypoxia and improvement of symptoms. Concern for reaccumulation of malignant pleural effusion. Pulmonary consulted IR but vision may not have much reaccumulation for Pleurx placement now and may need to follow-up with pulmonology as outpatient with weekly chest x-rays. Radiation oncology started radiation. Palliative care consulted for goals of care. Patient transferred to medical floor on 3/8.  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 - Hypoxia resolved.  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 He and his wife have discussed with me and pulmonologist and at this time he is agreeable to proceed with Pleurx. IR consulted for same. However at this time patient may not have  enough pleural fluid for Pleurx catheter placement. Unless he has significant reaccumulation prior to discharge, he may have to follow up as an outpatient with weekly chest x-rays with pulmonology and consider Pleurx catheter at that time. Discussed with Dr. [name], pulmonology on 3/9. Chest x-ray shows worsening pleural effusions. - Doubt pneumonia and hence agree with discontinued IV Rocephin and azithromycin on 3/8. - Discussed with Dr. [name], oncologist on 3/9. He indicated that patient has aggressive form of multiple myeloma and recommends palliative care consulted for goals of care. He will follow-up in the office regarding further chemotherapy and referral to [facility]. 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 - As per IR on 3/10, not enough pleural fluid to place Pleurx catheter. Outpatient follow-up with pulmonology.  Progressive multiple myeloma - CT chest, abdomen and pelvis show several masses concerning for myeloma progression. - Oncology input 3/7 appreciated: Continue steroids, radiation oncology consulted for palliative radiation to address enlarging masses and oncology will consider transferring to [facility] as outpatient. - 2-D echo: EF 65-70 percent, grade 1 diastolic dysfunction, no wall motion abnormality and trivial pericardial effusion (CT had suggested mass effect on pericardium) - Discussed with Dr. [name], oncologist on 3/9. He indicated that patient has aggressive form of multiple myeloma and recommends palliative care consulted for goals of care. He will follow-up in the office regarding further chemotherapy and referral to [facility]. - Palliative care input appreciated: Essentially at this time patient would wish to continue aggressive medical care.  Hyponatremia - Likely  multifactorial - Serum osmolarity: 268 and urine osmolarity: 216. - Unclear etiology-? Some relationship to hyperuricemia. Asymptomatic of CNS  symptoms. - Recurrent hyponatremia. Sodium 123 on 3/10.? Related to chronic kidney disease. Discussed with nephrologist on call on 3/10: He indicated that it would be difficult to interpret urine and serum osmolarity in the context of chronic kidney disease. He felt that patient was overall volume depleted due to high-dose Lasix patient received early on in admission along with mild sinus tachycardia. He recommended IV normal saline hydration and monitoring BMP. - Sodium has slightly improved to 126 from 123 in the last 24 hours. Continue IV NS and follow BMP.   Hyperkalemia, mild, recurrent - Potassium 5.3 on 3/9. May be secondary to worsening of acute kidney injury. DC Lasix. Provided a dose of Kayexalate. Change diet to renal diet. - Resolved.  Hyperuricemia - Uric acid is 12. Started allopurinol on 3/8 .  Acute on stage III chronic kidney disease - Baseline creatinine 2.1-2.5 - Now may have worsened due to diuretics. Creatinine was stable in the 2.7-2.8 over the last 3 days.  - However creatinine on 3/9 has increased to 3. Hold Lasix. Encouraged oral fluid intake. Stable creatinine  Essential hypertension - Uncontrolled. Increased hydralazine to prior home dose of 50 MG 3 times a day and resumed amlodipine 5 MG daily. - Controlled  Right hydroureteronephrosis - Some improvement on CT. 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  Constipation - Started bowel regimen.  - Patient received an enema on 3/9 and 3/10. Patient now starting to have BM with medium amount of stools 2 today. Continue bowel regimen. X-ray 3/10 showed large amount of fecal matter and gas  Anemia of chronic disease/malignancy and chemotherapy - Stable.   DVT prophylaxis: SCDs Code Status: Full Family Communication: None at bedside. Disposition  Plan: Transfer to medical floor 3/8. DC home when medically stable.   Consultants:  Pulmonary and critical care medicine  Medical oncology  Radiation oncology  Palliative care medicine   Procedures:  Right thoracentesis on 07/07/15  2-D echo 07/07/15: Study Conclusions  - Left ventricle: The cavity size was normal. Systolic function was  vigorous. The estimated ejection fraction was in the range of 65%  to 70%. Wall motion was normal; there were no regional wall  motion abnormalities. Doppler parameters are consistent with  abnormal left ventricular relaxation (grade 1 diastolic  dysfunction). - Left atrium: The atrium was mildly dilated. - Pericardium, extracardiac: A trivial pericardial effusion was  identified. Features were not consistent with tamponade  physiology. There was a moderate-sized left pleural effusion.  Antimicrobials:  Azithromycin 3/5 > 3/8  IV Rocephin 3/5 > 3/8   Subjective: Patient has had 2-3 BMs with moderate amount of stool since enema on 3/10. Denies abdominal pain. Denies dyspnea.  Objective: Filed Vitals:   07/12/15 2050 07/13/15 0613 07/13/15 0727 07/13/15 1406  BP: 112/80 116/67  135/81  Pulse: 120 104  108  Temp: 98.3 F (36.8 C) 98.1 F (36.7 C)  97.5 F (36.4 C)  TempSrc: Oral Oral  Oral  Resp: '16 16  16  '$ Height:      Weight:   82.373 kg (181 lb 9.6 oz)   SpO2: 98% 98%  99%    Intake/Output Summary (Last 24 hours) at 07/13/15 1416 Last data filed at 07/13/15 1300  Gross per 24 hour  Intake 1641.25 ml  Output    750 ml  Net 891.25 ml   Filed Weights   07/10/15 2048 07/11/15 0521 07/13/15 0727  Weight: 82.5 kg (181 lb 14.1 oz) 82.5 kg (181 lb 14.1 oz)  82.373 kg (181 lb 9.6 oz)    Exam:  General exam: Pleasant middle-aged male sitting up comfortably in chair this morning. Respiratory system: Slightly diminished breath sounds in the bases but otherwise clear to auscultation. No increased work of  breathing. Cardiovascular system: S1 & S2 heard, RRR. No JVD, murmurs, gallops, clicks or pedal edema.  Gastrointestinal system: Abdomen is mildly distended, soft and nontender. Hyperactive bowel sounds heard. Central nervous system: Alert and oriented. No focal neurological deficits. Extremities: Symmetric 5 x 5 power.   Data Reviewed: Basic Metabolic Panel:  Recent Labs Lab 07/10/15 0505 07/11/15 0328 07/11/15 1710 07/12/15 0540 07/13/15 0600  NA 125* 121* 127* 123* 126*  K 4.6 5.3* 4.5 4.1 4.5  CL 91* 88* 90* 87* 91*  CO2 '22 23 24 24 24  '$ GLUCOSE 114* 119* 118* 94 117*  BUN 43* 66* 71* 71* 76*  CREATININE 2.82* 3.00* 2.67* 2.95* 2.81*  CALCIUM 8.7* 8.9 9.3 8.9 9.2   Liver Function Tests:  Recent Labs Lab 07/07/15 0910 07/09/15 0400  AST 20 21  ALT 13* 14*  ALKPHOS 40 36*  BILITOT 1.0 0.6  PROT 9.1* 9.0*  ALBUMIN 3.7 3.5   No results for input(s): LIPASE, AMYLASE in the last 168 hours. No results for input(s): AMMONIA in the last 168 hours. CBC:  Recent Labs Lab 07/07/15 0910 07/07/15 1349 07/08/15 0400 07/09/15 0400 07/11/15 0328 07/12/15 0540  WBC 4.0 3.3* 6.2 6.6 7.2 7.0  NEUTROABS 2.6  --   --   --   --   --   HGB 9.2* 9.4* 8.9* 8.7* 8.7* 8.6*  HCT 27.9* 28.1* 26.9* 26.0* 26.0* 25.0*  MCV 95.2 95.3 93.4 93.5 93.9 90.3  PLT 234 196 202 215 216 176   Cardiac Enzymes: No results for input(s): CKTOTAL, CKMB, CKMBINDEX, TROPONINI in the last 168 hours. BNP (last 3 results) No results for input(s): PROBNP in the last 8760 hours. CBG: No results for input(s): GLUCAP in the last 168 hours.  Recent Results (from the past 240 hour(s))  Culture, blood (Routine X 2) w Reflex to ID Panel     Status: None   Collection Time: 07/07/15  9:31 AM  Result Value Ref Range Status   Specimen Description BLOOD RIGHT CHEST  Final   Special Requests BOTTLES DRAWN AEROBIC AND ANAEROBIC 5 CC  Final   Culture   Final    NO GROWTH 5 DAYS Performed at [facility]    Report Status 07/12/2015 FINAL  Final  Culture, blood (Routine X 2) w Reflex to ID Panel     Status: None   Collection Time: 07/07/15  9:41 AM  Result Value Ref Range Status   Specimen Description BLOOD RIGHT ANTECUBITAL  Final   Special Requests BOTTLES DRAWN AEROBIC AND ANAEROBIC 5 CC  Final   Culture   Final    NO GROWTH 5 DAYS Performed at [facility]    Report Status 07/12/2015 FINAL  Final  MRSA PCR Screening     Status: None   Collection Time: 07/07/15  1:09 PM  Result Value Ref Range Status   MRSA by PCR NEGATIVE NEGATIVE Final    Comment:        The GeneXpert MRSA Assay (FDA approved for NASAL specimens only), is one component of a comprehensive MRSA colonization surveillance program. It is not intended to diagnose MRSA infection nor to guide or monitor treatment for MRSA infections.  Studies: Dg Chest 2 View  07/12/2015  CLINICAL DATA:  History of myeloma and bladder cancer. Evaluate bilateral pleural effusions EXAM: CHEST  2 VIEW COMPARISON:  07/10/2015; 07/07/2015; chest CT - 07/07/2015 FINDINGS: Grossly unchanged cardiac silhouette and mediastinal contours with persistent thickening of the bilateral paratracheal stripe secondary to known mediastinal lymphadenopathy. Small right and trace left-sided pleural effusions are unchanged. Unchanged bibasilar heterogeneous/consolidative opacities. No new focal airspace opacities. No definite evidence of edema. Surgical clips overlie the left axilla. No acute osseus abnormalities. IMPRESSION: 1. Grossly unchanged small right and trace left-sided pleural effusions with associated bibasilar opacities, likely atelectasis. 2. Similar findings compatible with known mediastinal adenopathy. Electronically Signed   By: Sandi Mariscal M.D.   On: 07/12/2015 10:27   Ir Fluoro Rm 30-60 Min  07/12/2015  INDICATION: History of multiple myeloma, post right-sided thoracentesis on 07/07/2014 which yielded  approximately 1.6 L of serosanguineous fluid. Cytologic analysis confirmed the presence of malignant cells. As such, request made for placement of an ultrasound and fluoroscopic guided right-sided pleural its catheter for palliative purposes. EXAM: IR FLOURO RM 0-60 MIN COMPARISON:  Chest radiograph - earlier same day; 07/10/2015; 07/09/2015; 07/07/2015; chest CT - 07/07/2015 MEDICATIONS: None. CONTRAST:  None FLUOROSCOPY TIME:  None COMPLICATIONS: None immediate TECHNIQUE: Patient was positioned supine on the fluoroscopy table and preprocedural ultrasound scanning was performed of the right lateral chest. Images were reviewed and the decision was made not to proceed with either ultrasound-guided thoracentesis or tunneled pleural drainage catheter placement FINDINGS: Preprocedural ultrasound scanning of the right chest demonstrated only a trace/small amount of right-sided pleural fluid, not enough to warrant either ultrasound-guided thoracentesis or tunneled pleural drainage catheter at the present time. Additionally, upon questioning of the patient, he denies shortness of breath or chest discomfort. IMPRESSION: Trace/small amount of right-sided pleural fluid, not enough to warrant either ultrasound-guided thoracentesis for tunneled pleural drainage catheter placement. PLAN: Pending the rapidity of pleural fluid reaccumulation, would consider proceeding with therapeutic ultrasound guided thoracentesis prior to subjecting the patient to placement of a palliative tunneled pleural catheter. Electronically Signed   By: Sandi Mariscal M.D.   On: 07/12/2015 12:38   Dg Abd Portable 1v  07/12/2015  CLINICAL DATA:  Myelopathy on a.  Abdominal pain and constipation. EXAM: PORTABLE ABDOMEN - 1 VIEW COMPARISON:  CT 07/07/2015 FINDINGS: There is large amount of gas and fecal matter throughout the colon. Moderate amount of small bowel gas. No free air seen. No acute bone finding. IMPRESSION: Large amount of fecal matter and gas  within the colon consistent with the clinical suspicion of constipation. No free air seen. Electronically Signed   By: Nelson Chimes M.D.   On: 07/12/2015 16:02    07/07/15, CT chest, abdomen and pelvis without contrast  IMPRESSION: 1. Small to moderate left and large right pleural effusion. Proportional bilateral volume loss as a result. These are new from 03/20/2015. 2. Mass effect on the anterior heart border may be hemodynamically significant. The jugular veins appear more prominent than they did previously. 3. Bulky multinodular soft tissue mass interposed between the anterior chest wall and anterior pericardium causing mass effect, likely reflecting extensive mediastinal adenopathy. There is similar fullness in the superior mediastinum which likely reflects a combination of venous engorgement and adenopathy. 4. Right chest wall mass persists but is smaller when compared to prior study. 5. Market enlargement of left periaortic mass inseparable from left kidney. This causes significant mass effect upon the left kidney as well as on the tail of  the pancreas and stomach. 6. Evidence of new mesenteric adenopathy and peritoneal implants as well as inguinal adenopathy. 7. Severe diffuse bladder wall thickening. Lobulated inferior posterior bladder mass again identified. 8. Moderate right hydroureteronephrosis slightly less pronounced when compared to the prior study.    Scheduled Meds: . allopurinol  100 mg Oral Daily  . amLODipine  5 mg Oral Daily  . dexamethasone  4 mg Oral 4 times per day  . feeding supplement (ENSURE ENLIVE)  237 mL Oral BID BM  . finasteride  5 mg Oral Daily  . hydrALAZINE  50 mg Oral TID  . polyethylene glycol  17 g Oral BID  . senna  2 tablet Oral BID  . sodium chloride flush  10-40 mL Intracatheter Q12H  . sodium chloride flush  3 mL Intravenous Q12H  . tamsulosin  0.4 mg Oral BID   Continuous Infusions: . sodium chloride 75 mL/hr at 07/13/15 0941     Active Problems:   Dyspnea   Pleural effusion   Metastatic cancer (HCC)   Acute respiratory failure with hypoxia (HCC)   Malignant pleural effusion   Acute renal failure superimposed on stage 3 chronic kidney disease ([place])   Multiple myeloma (HCC)   Status post thoracentesis   Palliative care encounter   Counseling regarding advanced directives and goals of care   Bilateral pleural effusion   Constipation    Time spent: 25 minutes.    Vernell Leep, MD, FACP, FHM. [facility] Pager [phone]  If 7PM-7AM, please contact night-coverage [email or web address] Password [email or web address] 07/13/2015, 2:16 PM    LOS: 6 days

## 2015-07-13 NOTE — Progress Notes (Signed)
Pt was sitting up in chair when [facility] arrived; wife was bedside. Their visit req was regarding AD. CH thoroughly explained AD and the differences btwn LW and HCPOA. Pt and wife were appreciative and commented w/o questions regarding understanding. They will make a decision and wish to complete w/notary next week. Please page if assistance is needed prior to that time.  [place], M.Div.   07/13/15 1700  Clinical Encounter Type  Visited With Patient and family together

## 2015-07-13 NOTE — Progress Notes (Signed)
RN noted  a moderate amount of stool in toilet, MD notified.

## 2015-07-14 ENCOUNTER — Inpatient Hospital Stay (HOSPITAL_COMMUNITY): Payer: Medicare HMO

## 2015-07-14 LAB — BASIC METABOLIC PANEL
ANION GAP: 12 (ref 5–15)
BUN: 76 mg/dL — ABNORMAL HIGH (ref 6–20)
CO2: 21 mmol/L — ABNORMAL LOW (ref 22–32)
Calcium: 9.5 mg/dL (ref 8.9–10.3)
Chloride: 95 mmol/L — ABNORMAL LOW (ref 101–111)
Creatinine, Ser: 2.52 mg/dL — ABNORMAL HIGH (ref 0.61–1.24)
GFR calc Af Amer: 29 mL/min — ABNORMAL LOW (ref >60)
GFR, EST NON AFRICAN AMERICAN: 25 mL/min — AB (ref >60)
Glucose, Bld: 131 mg/dL — ABNORMAL HIGH (ref 65–99)
POTASSIUM: 4.9 mmol/L (ref 3.5–5.1)
SODIUM: 128 mmol/L — AB (ref 135–145)

## 2015-07-14 MED ORDER — SODIUM CHLORIDE 0.9 % IV SOLN
INTRAVENOUS | Status: AC
  Administered 2015-07-14: via INTRAVENOUS
  Administered 2015-07-14: 1000 mL via INTRAVENOUS

## 2015-07-14 MED ORDER — BISACODYL 10 MG RE SUPP
10.0000 mg | Freq: Once | RECTAL | Status: DC
  Filled 2015-07-14 (×2): qty 1

## 2015-07-14 NOTE — Progress Notes (Signed)
PROGRESS NOTE    Wesley [name]  QQP:[id number]  DOB: 12/12/1945  DOA: 07/07/2015 PCP: [name], MD Outpatient Specialists: Oncology: [facility]. 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 [name] who presently is scheduled to start chemotherapy (3rd line therapy due to disease progression) with Carfilzomib, Pomalyst and Decadron presented with progressive dyspnea over the past 1-2 days PTA. Workup in the emergency department showed bilateral pleural effusions on chest x-ray. CT of the chest, abdomen and pelvis on 07/07/2015 revealed a moderate left and large right pleural effusion and several masses in the chest and abdomen concerning for multiple myeloma progression. Right therapeutic and diagnostic thoracentesis on 3/5 with resolution of hypoxia and improvement of symptoms. Concern for reaccumulation of malignant pleural effusion. Pulmonary consulted IR but not much pleural fluid to place Pleurx safely at this time. Outpatient follow-up with pulmonology to determine timing of Pleurx placement. Radiation oncology started radiation. Palliative care consulted for goals of care. Patient transferred to medical floor on 3/8. Currently managing hyponatremia and fecal impaction. Possible DC home in 1-2 days.  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 - Hypoxia resolved.  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 He and his wife have discussed with me and pulmonologist and at this time he is agreeable to proceed with  Pleurx. IR consulted for same. However at this time patient may not have enough pleural fluid for Pleurx catheter placement. Unless he has significant reaccumulation prior to discharge, he may have to follow up as an outpatient with weekly chest x-rays with pulmonology and consider Pleurx catheter at that time. Discussed with Dr. [name], pulmonology on 3/9. Chest x-ray shows worsening pleural effusions. - Doubt pneumonia and hence agree with discontinued IV Rocephin and azithromycin on 3/8. - Discussed with Dr. [name], oncologist on 3/9. He indicated that patient has aggressive form of multiple myeloma and recommends palliative care consulted for goals of care. He will follow-up in the office regarding further chemotherapy and referral to [facility]. 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 - As per IR on 3/10, not enough pleural fluid to place Pleurx catheter. Outpatient follow-up with pulmonology. - Transient dyspnea overnight. Chest x-ray shows minimal increase in pleural fluid but not substantial. Dyspnea may be multifactorial related to mediastinal mass and small pleural effusion. Monitor  Progressive multiple myeloma - CT chest, abdomen and pelvis show several masses concerning for myeloma progression. - Oncology input 3/7 appreciated: Continue steroids, radiation oncology consulted for palliative radiation to address enlarging masses and oncology will consider transferring to [facility] as outpatient. - 2-D echo: EF 65-70 percent, grade 1 diastolic dysfunction, no wall motion abnormality and trivial pericardial effusion (CT had suggested mass effect on pericardium) - Discussed with Dr. [name], oncologist on 3/9. He indicated that patient has aggressive form of multiple myeloma and recommends palliative care consulted for goals of care.  He will follow-up in the office regarding further chemotherapy and referral to [place]  care input appreciated: Essentially at this time patient would wish to continue aggressive medical care.  Hyponatremia - Likely multifactorial - Serum osmolarity: 268 and urine osmolarity: 216. - Unclear etiology-? Some relationship to hyperuricemia. Asymptomatic of CNS symptoms. - Recurrent hyponatremia. Sodium 123 on 3/10.? Related to chronic kidney disease. Discussed with nephrologist on call on 3/10: He indicated that it would be difficult to interpret urine and serum osmolarity in the context of chronic kidney disease. He felt that patient was overall volume depleted due to high-dose Lasix patient received early on in admission along with mild sinus tachycardia. He recommended IV normal saline hydration and monitoring BMP. - Sodium gradually improving. Continue IV NS for additional 24 hours and follow BMP.   Hyperkalemia, mild, recurrent - Potassium 5.3 on 3/9. May be secondary to worsening of acute kidney injury. DC Lasix. Provided a dose of Kayexalate. Change diet to renal diet. - Resolved.  Hyperuricemia - Uric acid is 12. Started allopurinol on 3/8 .  Acute on stage III chronic kidney disease - Baseline creatinine 2.1-2.5 - Now may have worsened due to diuretics. Creatinine was stable in the 2.7-2.8 over the last 3 days.  - However creatinine on 3/9 has increased to 3. Hold Lasix. Encouraged oral fluid intake. Creatinine has improved to 2.5.  Essential hypertension - Mildly Uncontrolled. Increased hydralazine to prior home dose of 50 MG 3 times a day and resumed amlodipine 5 MG daily. - Continue current regimen.  Right hydroureteronephrosis - Some improvement on CT. 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  Constipation - Started bowel regimen.  - Patient received an enema on 3/9 and 3/10. Patient now starting to have BM with  medium amount of stools 2 today. Continue bowel regimen. X-ray 3/10 showed large amount of fecal matter and gas. X-ray 3/12 shows improvement but still moderate stools. Dulcolax suppository 1 today.  Anemia of chronic disease/malignancy and chemotherapy - Stable.   DVT prophylaxis: SCDs Code Status: Full Family Communication: None at bedside. Disposition Plan: Transfer to medical floor 3/8. DC home when medically stable.   Consultants:  Pulmonary and critical care medicine  Medical oncology  Radiation oncology  Palliative care medicine   Procedures:  Right thoracentesis on 07/07/15  2-D echo 07/07/15: Study Conclusions  - Left ventricle: The cavity size was normal. Systolic function was  vigorous. The estimated ejection fraction was in the range of 65%  to 70%. Wall motion was normal; there were no regional wall  motion abnormalities. Doppler parameters are consistent with  abnormal left ventricular relaxation (grade 1 diastolic  dysfunction). - Left atrium: The atrium was mildly dilated. - Pericardium, extracardiac: A trivial pericardial effusion was  identified. Features were not consistent with tamponade  physiology. There was a moderate-sized left pleural effusion.  Antimicrobials:  Azithromycin 3/5 > 3/8  IV Rocephin 3/5 > 3/8   Subjective: Transient dyspnea last night. Passing flatus. BM yesterday. No abdominal pain, nausea or vomiting.  Objective: Filed Vitals:   07/13/15 1406 07/13/15 2042 07/13/15 2335 07/14/15 0518  BP: 135/81 149/87  161/98  Pulse: 108 117  125  Temp: 97.5 F (36.4 C) 97.8 F (36.6 C)  97.8 F (36.6 C)  TempSrc: Oral Oral  Oral  Resp: '16 16  22  '$ Height:      Weight:      SpO2: 99% 99% 97% 98%    Intake/Output Summary (Last 24 hours) at 07/14/15 1130 Last data filed at 07/13/15 2043  Gross per 24 hour  Intake    595 ml  Output    400 ml  Net    195 ml   Filed Weights   07/10/15 2048 07/11/15 0521 07/13/15 0727    Weight: 82.5 kg (181 lb 14.1 oz) 82.5 kg (181 lb 14.1 oz) 82.373 kg (181 lb 9.6 oz)    Exam:  General exam: Pleasant middle-aged male sitting up comfortably in bed this morning. Respiratory system: Slightly diminished breath sounds in the bases but otherwise clear to auscultation. No increased work of breathing. Cardiovascular system: S1 & S2 heard, RRR. No JVD, murmurs, gallops, clicks. Trace ankle edema.  Gastrointestinal system: Abdomen is mildly distended, soft and nontender. Hyperactive bowel sounds heard. Central nervous system: Alert and oriented. No focal neurological deficits. Extremities: Symmetric 5 x 5 power.   Data Reviewed: Basic Metabolic Panel:  Recent Labs Lab 07/11/15 0328 07/11/15 1710 07/12/15 0540 07/13/15 0600 07/14/15 0525  NA 121* 127* 123* 126* 128*  K 5.3* 4.5 4.1 4.5 4.9  CL 88* 90* 87* 91* 95*  CO2 '23 24 24 24 '$ 21*  GLUCOSE 119* 118* 94 117* 131*  BUN 66* 71* 71* 76* 76*  CREATININE 3.00* 2.67* 2.95* 2.81* 2.52*  CALCIUM 8.9 9.3 8.9 9.2 9.5   Liver Function Tests:  Recent Labs Lab 07/09/15 0400  AST 21  ALT 14*  ALKPHOS 36*  BILITOT 0.6  PROT 9.0*  ALBUMIN 3.5   No results for input(s): LIPASE, AMYLASE in the last 168 hours. No results for input(s): AMMONIA in the last 168 hours. CBC:  Recent Labs Lab 07/07/15 1349 07/08/15 0400 07/09/15 0400 07/11/15 0328 07/12/15 0540  WBC 3.3* 6.2 6.6 7.2 7.0  HGB 9.4* 8.9* 8.7* 8.7* 8.6*  HCT 28.1* 26.9* 26.0* 26.0* 25.0*  MCV 95.3 93.4 93.5 93.9 90.3  PLT 196 202 215 216 176   Cardiac Enzymes: No results for input(s): CKTOTAL, CKMB, CKMBINDEX, TROPONINI in the last 168 hours. BNP (last 3 results) No results for input(s): PROBNP in the last 8760 hours. CBG: No results for input(s): GLUCAP in the last 168 hours.  Recent Results (from the past 240 hour(s))  Culture, blood (Routine X 2) w Reflex to ID Panel     Status: None   Collection Time: 07/07/15  9:31 AM  Result Value Ref Range  Status   Specimen Description BLOOD RIGHT CHEST  Final   Special Requests BOTTLES DRAWN AEROBIC AND ANAEROBIC 5 CC  Final   Culture   Final    NO GROWTH 5 DAYS Performed at [facility]    Report Status 07/12/2015 FINAL  Final  Culture, blood (Routine X 2) w Reflex to ID Panel     Status: None   Collection Time: 07/07/15  9:41 AM  Result Value Ref Range Status   Specimen Description BLOOD RIGHT ANTECUBITAL  Final   Special Requests BOTTLES DRAWN AEROBIC AND ANAEROBIC 5 CC  Final   Culture   Final    NO GROWTH 5 DAYS Performed at [facility]    Report Status 07/12/2015 FINAL  Final  MRSA PCR Screening     Status: None   Collection Time: 07/07/15  1:09 PM  Result Value Ref Range Status   MRSA by PCR NEGATIVE NEGATIVE Final    Comment:        The GeneXpert MRSA Assay (FDA approved for NASAL specimens only), is one component of a comprehensive MRSA colonization surveillance program. It is not intended to diagnose MRSA infection nor to guide or  monitor treatment for MRSA infections.          Studies: Dg Chest 2 View  07/14/2015  CLINICAL DATA:  Bilateral pleural effusions. Personal history of right-sided pulmonary nodule cup multiple myeloma, bladder cancer. EXAM: CHEST - 2 VIEW COMPARISON:  Two-view chest x-ray 07/12/2015. CT of the chest 07/07/2015. FINDINGS: The heart size is exaggerated by low lung volumes. Bilateral pleural effusions are slightly increased. Anterior mediastinal mass is again noted. There is no significant edema. Bibasilar airspace disease likely reflects atelectasis. Surgical clips are again seen in the left axilla. IMPRESSION: 1. Slight increase in bilateral pleural effusions and associated atelectasis. 2. Anterior mediastinal mass. Electronically Signed   By: San Morelle M.D.   On: 07/14/2015 10:17   Dg Abd 1 View  07/14/2015  CLINICAL DATA:  Fecal impaction.  Pleural effusion. EXAM: ABDOMEN - 1 VIEW COMPARISON:  One-view abdomen  07/12/2015. FINDINGS: Moderate stool is noted throughout the colon, slightly decreased from the prior exam. There is no obstruction. The bowel gas pattern is otherwise normal. Degenerative changes are again noted in the lower lumbar spine. IMPRESSION: 1. Improving moderate stool burden. 2. No associated obstruction. Electronically Signed   By: San Morelle M.D.   On: 07/14/2015 10:11   Dg Abd Portable 1v  07/12/2015  CLINICAL DATA:  Myelopathy on a.  Abdominal pain and constipation. EXAM: PORTABLE ABDOMEN - 1 VIEW COMPARISON:  CT 07/07/2015 FINDINGS: There is large amount of gas and fecal matter throughout the colon. Moderate amount of small bowel gas. No free air seen. No acute bone finding. IMPRESSION: Large amount of fecal matter and gas within the colon consistent with the clinical suspicion of constipation. No free air seen. Electronically Signed   By: Nelson Chimes M.D.   On: 07/12/2015 16:02    07/07/15, CT chest, abdomen and pelvis without contrast  IMPRESSION: 1. Small to moderate left and large right pleural effusion. Proportional bilateral volume loss as a result. These are new from 03/20/2015. 2. Mass effect on the anterior heart border may be hemodynamically significant. The jugular veins appear more prominent than they did previously. 3. Bulky multinodular soft tissue mass interposed between the anterior chest wall and anterior pericardium causing mass effect, likely reflecting extensive mediastinal adenopathy. There is similar fullness in the superior mediastinum which likely reflects a combination of venous engorgement and adenopathy. 4. Right chest wall mass persists but is smaller when compared to prior study. 5. Market enlargement of left periaortic mass inseparable from left kidney. This causes significant mass effect upon the left kidney as well as on the tail of the pancreas and stomach. 6. Evidence of new mesenteric adenopathy and peritoneal implants as well as  inguinal adenopathy. 7. Severe diffuse bladder wall thickening. Lobulated inferior posterior bladder mass again identified. 8. Moderate right hydroureteronephrosis slightly less pronounced when compared to the prior study.    Scheduled Meds: . allopurinol  100 mg Oral Daily  . amLODipine  5 mg Oral Daily  . bisacodyl  10 mg Rectal Once  . dexamethasone  4 mg Oral 4 times per day  . feeding supplement (ENSURE ENLIVE)  237 mL Oral BID BM  . finasteride  5 mg Oral Daily  . hydrALAZINE  50 mg Oral TID  . polyethylene glycol  17 g Oral BID  . senna  2 tablet Oral BID  . sodium chloride flush  10-40 mL Intracatheter Q12H  . sodium chloride flush  3 mL Intravenous Q12H  . tamsulosin  0.4 mg  Oral BID   Continuous Infusions: . sodium chloride      Active Problems:   Dyspnea   Pleural effusion   Metastatic cancer (HCC)   Acute respiratory failure with hypoxia (HCC)   Malignant pleural effusion   Acute renal failure superimposed on stage 3 chronic kidney disease ([place])   Multiple myeloma (HCC)   Status post thoracentesis   Palliative care encounter   Counseling regarding advanced directives and goals of care   Bilateral pleural effusion   Constipation   Hyponatremia   Fecal impaction ([place])    Time spent: 25 minutes.    Vernell Leep, MD, FACP, FHM. [facility] Pager [phone]  If 7PM-7AM, please contact night-coverage [email or web address] Password [email or web address] 07/14/2015, 11:30 AM    LOS: 7 days

## 2015-07-15 ENCOUNTER — Inpatient Hospital Stay (HOSPITAL_COMMUNITY): Payer: Medicare HMO

## 2015-07-15 ENCOUNTER — Ambulatory Visit
Admit: 2015-07-15 | Discharge: 2015-07-15 | Disposition: A | Discharge status: Home / Self Care | Payer: Medicare HMO | Attending: Radiation Oncology | Admitting: Radiation Oncology

## 2015-07-15 DIAGNOSIS — Z7189 Other specified counseling: Secondary | ICD-10-CM

## 2015-07-15 DIAGNOSIS — Z515 Encounter for palliative care: Secondary | ICD-10-CM

## 2015-07-15 LAB — BASIC METABOLIC PANEL
Anion gap: 9 (ref 5–15)
BUN: 73 mg/dL — ABNORMAL HIGH (ref 6–20)
CHLORIDE: 96 mmol/L — AB (ref 101–111)
CO2: 21 mmol/L — ABNORMAL LOW (ref 22–32)
CREATININE: 2.36 mg/dL — AB (ref 0.61–1.24)
Calcium: 9.5 mg/dL (ref 8.9–10.3)
GFR, EST AFRICAN AMERICAN: 31 mL/min — AB (ref >60)
GFR, EST NON AFRICAN AMERICAN: 27 mL/min — AB (ref >60)
Glucose, Bld: 128 mg/dL — ABNORMAL HIGH (ref 65–99)
POTASSIUM: 5.3 mmol/L — AB (ref 3.5–5.1)
SODIUM: 126 mmol/L — AB (ref 135–145)

## 2015-07-15 MED ORDER — FUROSEMIDE 40 MG PO TABS
80.0000 mg | ORAL_TABLET | Freq: Every day | ORAL | Status: DC
  Administered 2015-07-15 – 2015-07-16 (×2): 80 mg via ORAL
  Filled 2015-07-15 (×2): qty 2

## 2015-07-15 MED ORDER — SODIUM POLYSTYRENE SULFONATE 15 GM/60ML PO SUSP
30.0000 g | Freq: Once | ORAL | Status: AC
  Administered 2015-07-15: 30 g via ORAL
  Filled 2015-07-15: qty 120

## 2015-07-15 MED ORDER — SODIUM CHLORIDE 0.9 % IV SOLN
INTRAVENOUS | Status: DC
  Administered 2015-07-15: 1000 mL via INTRAVENOUS

## 2015-07-15 MED ORDER — TRAMADOL HCL 50 MG PO TABS
50.0000 mg | ORAL_TABLET | Freq: Four times a day (QID) | ORAL | Status: DC | PRN
  Administered 2015-07-15 – 2015-07-18 (×6): 50 mg via ORAL
  Filled 2015-07-15 (×6): qty 1

## 2015-07-15 MED ORDER — PRO-STAT SUGAR FREE PO LIQD
30.0000 mL | Freq: Three times a day (TID) | ORAL | Status: DC
  Administered 2015-07-15 – 2015-07-18 (×4): 30 mL via ORAL
  Filled 2015-07-15 (×4): qty 30

## 2015-07-15 NOTE — Progress Notes (Addendum)
Pt requests breathing tx

## 2015-07-15 NOTE — Progress Notes (Signed)
PROGRESS NOTE    Balian Schaller  LKG:[id number]  DOB: [birth year]  DOA: 07/07/2015 PCP: [name], MD Outpatient Specialists: Oncology: Dr. 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 [name] who presently is scheduled to start chemotherapy (3rd line therapy due to disease progression) with Carfilzomib, Pomalyst and Decadron presented with progressive dyspnea over the past 1-2 days PTA. Workup in the emergency department showed bilateral pleural effusions on chest x-ray. CT of the chest, abdomen and pelvis on 07/07/2015 revealed a moderate left and large right pleural effusion and several masses in the chest and abdomen concerning for multiple myeloma progression. Right therapeutic and diagnostic thoracentesis on 3/5 with resolution of hypoxia and improvement of symptoms. Concern for reaccumulation of malignant pleural effusion. Pulmonary consulted IR but not much pleural fluid to place Pleurx safely at this time. Outpatient follow-up with pulmonology to determine timing of Pleurx placement. Radiation oncology started radiation. Palliative care consulted for goals of care. Patient transferred to medical floor on 3/8. Currently managing hyponatremia and fecal impaction. Possible DC home in 1-2 days.  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 - Hypoxia resolved.  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 He and his wife have discussed with me and pulmonologist and at this time he is agreeable to proceed with  Pleurx. IR consulted for same. However at this time patient may not have enough pleural fluid for Pleurx catheter placement. Unless he has significant reaccumulation prior to discharge, he may have to follow up as an outpatient with weekly chest x-rays with pulmonology and consider Pleurx catheter at that time. Discussed with Dr. [name], pulmonology on 3/9. Chest x-ray shows worsening pleural effusions. - Doubt pneumonia and hence agree with discontinued IV Rocephin and azithromycin on 3/8. - Discussed with Dr. [name], oncologist on 3/9. He indicated that patient has aggressive form of multiple myeloma and recommends palliative care consulted for goals of care. He will follow-up in the office regarding further chemotherapy and referral to [facility]. 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 - As per IR on 3/10, not enough pleural fluid to place Pleurx catheter. Outpatient follow-up with pulmonology. - Intermittent dyspnea. Chest x-ray shows minimal increase in pleural fluid but not substantial. Dyspnea may be multifactorial related to mediastinal mass and small pleural effusion. Monitor  Progressive multiple myeloma - CT chest, abdomen and pelvis show several masses concerning for myeloma progression. - Oncology input 3/7 appreciated: Continue steroids, radiation oncology consulted for palliative radiation to address enlarging masses and oncology will consider transferring to [facility] as outpatient. - 2-D echo: EF 65-70 percent, grade 1 diastolic dysfunction, no wall motion abnormality and trivial pericardial effusion (CT had suggested mass effect on pericardium) - Discussed with Dr. [name], oncologist on 3/9. He indicated that patient has aggressive form of multiple myeloma and recommends palliative care consulted for goals of care. He  will follow-up in the office regarding further chemotherapy and referral to [place]  input appreciated: Essentially at this time patient would wish to continue aggressive medical care.  Hyponatremia - Likely multifactorial - Serum osmolarity: 268 and urine osmolarity: 216. - Unclear etiology-? Some relationship to hyperuricemia. Asymptomatic of CNS symptoms. - Recurrent hyponatremia. Sodium 123 on 3/10.? Related to chronic kidney disease. Discussed with nephrologist on call on 3/10: He indicated that it would be difficult to interpret urine and serum osmolarity in the context of chronic kidney disease. He felt that patient was overall volume depleted due to high-dose Lasix patient received early on in admission along with mild sinus tachycardia. He recommended IV normal saline hydration and monitoring BMP. - Continues to have recurrent hyponatremia. Given this and recurrent hyperkalemia, nephrology consulted for assistance with evaluation and management.   Hyperkalemia, mild, recurrent - Potassium 5.3 on 3/9. May be secondary to worsening of acute kidney injury. DC Lasix. Provided a dose of Kayexalate. Change diet to renal diet. - Recurrent mild hyperkalemia. Potassium again 5.3 today. - Nephrology consulted for assistance.  Hyperuricemia - Uric acid is 12. Started allopurinol on 3/8 .  Acute on stage III chronic kidney disease - Baseline creatinine 2.1-2.5 - Now may have worsened due to diuretics. Creatinine was stable in the 2.7-2.8 over the last 3 days.  - However creatinine on 3/9 has increased to 3. Hold Lasix. Encouraged oral fluid intake. Creatinine has improved to 2.3.  Essential hypertension - Mildly Uncontrolled. Increased hydralazine to prior home dose of 50 MG 3 times a day and resumed amlodipine 5 MG daily. - Continue current regimen.  Right hydroureteronephrosis - Some improvement on CT. 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  Constipation - Started bowel regimen.  - Patient received a couple of enemas a few days ago. Now having BMs. Continue bowel regimen of MiraLAX and senna.  Anemia of chronic disease/malignancy and chemotherapy - Stable.   DVT prophylaxis: SCDs Code Status: Full Family Communication: None at bedside. Disposition Plan: Transfer to medical floor 3/8. DC home when medically stable.   Consultants:  Pulmonary and critical care medicine  Medical oncology  Radiation oncology  Palliative care medicine   Nephrology.  Procedures:  Right thoracentesis on 07/07/15  2-D echo 07/07/15: Study Conclusions  - Left ventricle: The cavity size was normal. Systolic function was  vigorous. The estimated ejection fraction was in the range of 65%  to 70%. Wall motion was normal; there were no regional wall  motion abnormalities. Doppler parameters are consistent with  abnormal left ventricular relaxation (grade 1 diastolic  dysfunction). - Left atrium: The atrium was mildly dilated. - Pericardium, extracardiac: A trivial pericardial effusion was  identified. Features were not consistent with tamponade  physiology. There was a moderate-sized left pleural effusion.  Antimicrobials:  Azithromycin 3/5 > 3/8  IV Rocephin 3/5 > 3/8   Subjective: States that he had 2 large BMs yesterday and hence did not take the Dulcolax suppository that was ordered. Denies abdominal pain, nausea or vomiting. Intermittent dyspnea overnight/orthopnea.  Objective: Filed Vitals:   07/15/15 0604 07/15/15 0626 07/15/15 1059 07/15/15 1318  BP: 159/100  146/95 152/92  Pulse:    116  Temp:    97.6 F (36.4 C)  TempSrc:    Oral  Resp:    20  Height:      Weight:      SpO2:  96%  98%    Intake/Output Summary (Last 24 hours) at 07/15/15 1357 Last data filed at 07/15/15 1300  Gross per 24 hour  Intake  2181.25 ml  Output   1500 ml  Net 681.25 ml   Filed Weights   07/11/15 0521  07/13/15 0727 07/15/15 0601  Weight: 82.5 kg (181 lb 14.1 oz) 82.373 kg (181 lb 9.6 oz) 85.866 kg (189 lb 4.8 oz)    Exam:  General exam: Pleasant middle-aged male sitting up comfortably in chair this morning seen eating breakfast. Respiratory system: diminished breath sounds in the bases but otherwise clear to auscultation. No increased work of breathing. Cardiovascular system: S1 & S2 heard, RRR. No JVD, murmurs, gallops, clicks. Trace ankle edema.  Gastrointestinal system: Abdomen is mildly distended, soft and nontender. Hyperactive bowel sounds heard. Central nervous system: Alert and oriented. No focal neurological deficits. Extremities: Symmetric 5 x 5 power.   Data Reviewed: Basic Metabolic Panel:  Recent Labs Lab 07/11/15 1710 07/12/15 0540 07/13/15 0600 07/14/15 0525 07/15/15 0505  NA 127* 123* 126* 128* 126*  K 4.5 4.1 4.5 4.9 5.3*  CL 90* 87* 91* 95* 96*  CO2 '24 24 24 '$ 21* 21*  GLUCOSE 118* 94 117* 131* 128*  BUN 71* 71* 76* 76* 73*  CREATININE 2.67* 2.95* 2.81* 2.52* 2.36*  CALCIUM 9.3 8.9 9.2 9.5 9.5   Liver Function Tests:  Recent Labs Lab 07/09/15 0400  AST 21  ALT 14*  ALKPHOS 36*  BILITOT 0.6  PROT 9.0*  ALBUMIN 3.5   No results for input(s): LIPASE, AMYLASE in the last 168 hours. No results for input(s): AMMONIA in the last 168 hours. CBC:  Recent Labs Lab 07/09/15 0400 07/11/15 0328 07/12/15 0540  WBC 6.6 7.2 7.0  HGB 8.7* 8.7* 8.6*  HCT 26.0* 26.0* 25.0*  MCV 93.5 93.9 90.3  PLT 215 216 176   Cardiac Enzymes: No results for input(s): CKTOTAL, CKMB, CKMBINDEX, TROPONINI in the last 168 hours. BNP (last 3 results) No results for input(s): PROBNP in the last 8760 hours. CBG: No results for input(s): GLUCAP in the last 168 hours.  Recent Results (from the past 240 hour(s))  Culture, blood (Routine X 2) w Reflex to ID Panel     Status: None   Collection Time: 07/07/15  9:31 AM  Result Value Ref Range Status   Specimen Description  BLOOD RIGHT CHEST  Final   Special Requests BOTTLES DRAWN AEROBIC AND ANAEROBIC 5 CC  Final   Culture   Final    NO GROWTH 5 DAYS Performed at [facility]    Report Status 07/12/2015 FINAL  Final  Culture, blood (Routine X 2) w Reflex to ID Panel     Status: None   Collection Time: 07/07/15  9:41 AM  Result Value Ref Range Status   Specimen Description BLOOD RIGHT ANTECUBITAL  Final   Special Requests BOTTLES DRAWN AEROBIC AND ANAEROBIC 5 CC  Final   Culture   Final    NO GROWTH 5 DAYS Performed at [facility]    Report Status 07/12/2015 FINAL  Final  MRSA PCR Screening     Status: None   Collection Time: 07/07/15  1:09 PM  Result Value Ref Range Status   MRSA by PCR NEGATIVE NEGATIVE Final    Comment:        The GeneXpert MRSA Assay (FDA approved for NASAL specimens only), is one component of a comprehensive MRSA colonization surveillance program. It is not intended to diagnose MRSA infection nor to guide or monitor treatment for MRSA infections.          Studies: Dg Chest 2 View  07/15/2015  CLINICAL DATA:  Known multiple myeloma. Shortness of breath and pleural effusions EXAM: CHEST  2 VIEW COMPARISON:  July 14, 2015 chest radiograph ; chest CT July 07, 2015 FINDINGS: Fairly small pleural effusions bilaterally remain stable. There is atelectatic change in the bases. No frank edema or consolidation. Heart is upper normal in size with pulmonary vascularity within normal limits. The anterior mediastinal mass is seen on recent CT are less well seen radiographically. There is soft tissue fullness in the anterior mediastinum, stable radiographically. There are surgical clips anteriorly on the left. There is a destructive lytic bone lesion involving much of the right anterior and lateral seventh rib. Associated soft tissue mass present in this area as well. IMPRESSION: Persistent small pleural effusions. Mild bibasilar atelectasis. Anterior mediastinal masses,  better seen by CT. Destructive lesion right seventh rib with soft tissue mass. Electronically Signed   By: Lowella Grip III M.D.   On: 07/15/2015 10:12   Dg Chest 2 View  07/14/2015  CLINICAL DATA:  Bilateral pleural effusions. Personal history of right-sided pulmonary nodule cup multiple myeloma, bladder cancer. EXAM: CHEST - 2 VIEW COMPARISON:  Two-view chest x-ray 07/12/2015. CT of the chest 07/07/2015. FINDINGS: The heart size is exaggerated by low lung volumes. Bilateral pleural effusions are slightly increased. Anterior mediastinal mass is again noted. There is no significant edema. Bibasilar airspace disease likely reflects atelectasis. Surgical clips are again seen in the left axilla. IMPRESSION: 1. Slight increase in bilateral pleural effusions and associated atelectasis. 2. Anterior mediastinal mass. Electronically Signed   By: San Morelle M.D.   On: 07/14/2015 10:17   Dg Abd 1 View  07/14/2015  CLINICAL DATA:  Fecal impaction.  Pleural effusion. EXAM: ABDOMEN - 1 VIEW COMPARISON:  One-view abdomen 07/12/2015. FINDINGS: Moderate stool is noted throughout the colon, slightly decreased from the prior exam. There is no obstruction. The bowel gas pattern is otherwise normal. Degenerative changes are again noted in the lower lumbar spine. IMPRESSION: 1. Improving moderate stool burden. 2. No associated obstruction. Electronically Signed   By: San Morelle M.D.   On: 07/14/2015 10:11    07/07/15, CT chest, abdomen and pelvis without contrast  IMPRESSION: 1. Small to moderate left and large right pleural effusion. Proportional bilateral volume loss as a result. These are new from 03/20/2015. 2. Mass effect on the anterior heart border may be hemodynamically significant. The jugular veins appear more prominent than they did previously. 3. Bulky multinodular soft tissue mass interposed between the anterior chest wall and anterior pericardium causing mass effect, likely  reflecting extensive mediastinal adenopathy. There is similar fullness in the superior mediastinum which likely reflects a combination of venous engorgement and adenopathy. 4. Right chest wall mass persists but is smaller when compared to prior study. 5. Market enlargement of left periaortic mass inseparable from left kidney. This causes significant mass effect upon the left kidney as well as on the tail of the pancreas and stomach. 6. Evidence of new mesenteric adenopathy and peritoneal implants as well as inguinal adenopathy. 7. Severe diffuse bladder wall thickening. Lobulated inferior posterior bladder mass again identified. 8. Moderate right hydroureteronephrosis slightly less pronounced when compared to the prior study.    Scheduled Meds: . allopurinol  100 mg Oral Daily  . amLODipine  5 mg Oral Daily  . bisacodyl  10 mg Rectal Once  . dexamethasone  4 mg Oral 4 times per day  . feeding supplement (PRO-STAT SUGAR FREE 64)  30 mL Oral TID WC  .  finasteride  5 mg Oral Daily  . hydrALAZINE  50 mg Oral TID  . polyethylene glycol  17 g Oral BID  . senna  2 tablet Oral BID  . sodium chloride flush  10-40 mL Intracatheter Q12H  . sodium chloride flush  3 mL Intravenous Q12H  . tamsulosin  0.4 mg Oral BID   Continuous Infusions:    Active Problems:   Dyspnea   Pleural effusion   Metastatic cancer ([place])   Acute respiratory failure with hypoxia (HCC)   Malignant pleural effusion   Acute renal failure superimposed on stage 3 chronic kidney disease ([place])   Multiple myeloma (HCC)   Status post thoracentesis   Palliative care encounter   Counseling regarding advanced directives and goals of care   Bilateral pleural effusion   Constipation   Hyponatremia   Fecal impaction ([place])    Time spent: 25 minutes.    Vernell Leep, MD, FACP, FHM. [facility] Pager [phone]  If 7PM-7AM, please contact night-coverage [email or web address] Password [email or web address] 07/15/2015, 1:57 PM     LOS: 8 days

## 2015-07-15 NOTE — Consult Note (Addendum)
Wesley [name] is an 70 y.o. male referred by Dr Waymon Amato   Chief Complaint: Hyponatremia, CKD HPI: 69yo BM with advanced MM admitted on 07/07/15 for SOB due in part to Rt pleural effusion whom we are asked to see for chronic hyponatremia. SNa Nl  prior to Feb when became sl low and on admission SNa 126.  It remains at 126 today. I/O's incomplete.  He has some CKD with chronic Rt hydronephrosis and has seen urology.  Rt hydro remains based on CT abd.  SOsm on 3/10 was 302 (initially 268) and UOsm 416 (initially 216).  Past Medical History  Diagnosis Date  . Pulmonary nodule, right     W/  CHEST WALL MASS--  SCHEDULED FOR BIOPSY W/ DR Kendrick Fries ON FRIDAY 10-27-2013  . History of seizures as a child     AGE 23 OR 8--- NONE ISSUE SINCE  . History of GI bleed     SECONDARY TO ULCER --  DEC 2011  RESOLVED PER LAST EGD 07-05-2010  . Bladder tumor   . Hematuria   . Radiation 12/07/13-12/27/13    right anterior chest 37.5 gray  . Fracture of radius 09/29/14    left  . Cancer (HCC)     multiple myeloma, bladder cancer 2015  . Encounter for antineoplastic chemotherapy 05/12/2015    Past Surgical History  Procedure Laterality Date  . Excision benign left axilla tumor  1993  . Esophagogastroduodenoscopy  X5   LAST ONE 07-05-2010  . Transurethral resection of bladder tumor with gyrus (turbt-gyrus) N/A 10/25/2013    Procedure: TRANSURETHRAL RESECTION OF BLADDER TUMOR WITH GYRUS (TURBT-GYRUS);  Surgeon: Magdalene Molly, MD;  Location: [facility];  Service: Urology;  Laterality: N/A;  . Cystoscopy with biopsy N/A 10/25/2013    Procedure: CYSTOSCOPY;  Surgeon: Magdalene Molly, MD;  Location: [facility];  Service: Urology;  Laterality: N/A;  . Bone biopsy Right 11/14/13     right seventh rib lesion  . Orif radial fracture Left 10/22/2014    Procedure: Intramedullary nailing left radius fracture;  Surgeon: Mack Hook, MD;  Location: [facility];  Service:  Orthopedics;  Laterality: Left;  OPEN TREATMENT LEFT RADIUS FRACTURE  . Cystoscopy with retrograde pyelogram, ureteroscopy and stent placement N/A 02/21/2015    Procedure: CYSTOSCOPY, EXAM UNDER ANESTHESIA, FOLEY CATHETER PLACEMENT;  Surgeon: Jerilee Field, MD;  Location: [facility];  Service: Urology;  Laterality: N/A;    Family History  Problem Relation Age of Onset  . Heart disease Mother   . Cancer - Prostate Brother    Social History:  reports that he quit smoking about 46 years ago. His smoking use included Cigarettes. He has a .1 pack-year smoking history. He has never used smokeless tobacco. He reports that he does not drink alcohol or use illicit drugs.  Allergies: No Known Allergies  Medications Prior to Admission  Medication Sig Dispense Refill  . acetaminophen (TYLENOL) 325 MG tablet Take 2 tablets (650 mg total) by mouth every 4 (four) hours as needed for mild pain, fever or headache. 30 tablet 0  . amLODipine (NORVASC) 5 MG tablet Take 1 tablet (5 mg total) by mouth daily. 90 tablet 3  . dexamethasone (DECADRON) 4 MG tablet 10 tablet by mouth every week starting with the first dose of chemotherapy. (Patient taking differently: Take 40 mg by mouth once a week. starting with the first dose of chemotherapy.) 80 tablet 2  . finasteride (PROSCAR) 5 MG tablet Take 1  tablet (5 mg total) by mouth daily. 30 tablet 1  . hydrALAZINE (APRESOLINE) 50 MG tablet Take 1 tablet (50 mg total) by mouth every 8 (eight) hours. 90 tablet 1  . HYDROcodone-acetaminophen (NORCO/VICODIN) 5-325 MG tablet Take 1-2 tablets by mouth every 4 (four) hours as needed for moderate pain. 65 tablet 0  . lactose free nutrition (BOOST) LIQD Take 237 mLs by mouth 2 (two) times daily.    Marland Kitchen lidocaine-prilocaine (EMLA) cream Apply 1 application topically as needed. (Patient taking differently: Apply 1 application topically as needed (for port). ) 30 g 0  . [place]    . prochlorperazine  (COMPAZINE) 10 MG tablet Take 1 tablet (10 mg total) by mouth every 6 (six) hours as needed for nausea or vomiting. 30 tablet 0  . tamsulosin (FLOMAX) 0.4 MG CAPS capsule Take 1 capsule (0.4 mg total) by mouth daily. (Patient taking differently: Take 0.4 mg by mouth 2 (two) times daily. ) 30 capsule 1  . pomalidomide (POMALYST) 3 MG capsule Take 1 capsule (3 mg total) by mouth daily. Take with water on days 1-21. Repeat every 28 days. (Patient not taking: Reported on 07/07/2015) 21 capsule 0     Lab Results: UA: No UA done  No results for input(s): WBC, HGB, HCT, PLT in the last 72 hours. BMET  Recent Labs  07/13/15 0600 07/14/15 0525 07/15/15 0505  NA 126* 128* 126*  K 4.5 4.9 5.3*  CL 91* 95* 96*  CO2 24 21* 21*  GLUCOSE 117* 131* 128*  BUN 76* 76* 73*  CREATININE 2.81* 2.52* 2.36*  CALCIUM 9.2 9.5 9.5   LFT No results for input(s): PROT, ALBUMIN, AST, ALT, ALKPHOS, BILITOT, BILIDIR, IBILI in the last 72 hours. Dg Chest 2 View  07/15/2015  CLINICAL DATA:  Known multiple myeloma. Shortness of breath and pleural effusions EXAM: CHEST  2 VIEW COMPARISON:  July 14, 2015 chest radiograph ; chest CT July 07, 2015 FINDINGS: Fairly small pleural effusions bilaterally remain stable. There is atelectatic change in the bases. No frank edema or consolidation. Heart is upper normal in size with pulmonary vascularity within normal limits. The anterior mediastinal mass is seen on recent CT are less well seen radiographically. There is soft tissue fullness in the anterior mediastinum, stable radiographically. There are surgical clips anteriorly on the left. There is a destructive lytic bone lesion involving much of the right anterior and lateral seventh rib. Associated soft tissue mass present in this area as well. IMPRESSION: Persistent small pleural effusions. Mild bibasilar atelectasis. Anterior mediastinal masses, better seen by CT. Destructive lesion right seventh rib with soft tissue mass.  Electronically Signed   By: Lowella Grip III M.D.   On: 07/15/2015 10:12   Dg Chest 2 View  07/14/2015  CLINICAL DATA:  Bilateral pleural effusions. Personal history of right-sided pulmonary nodule cup multiple myeloma, bladder cancer. EXAM: CHEST - 2 VIEW COMPARISON:  Two-view chest x-ray 07/12/2015. CT of the chest 07/07/2015. FINDINGS: The heart size is exaggerated by low lung volumes. Bilateral pleural effusions are slightly increased. Anterior mediastinal mass is again noted. There is no significant edema. Bibasilar airspace disease likely reflects atelectasis. Surgical clips are again seen in the left axilla. IMPRESSION: 1. Slight increase in bilateral pleural effusions and associated atelectasis. 2. Anterior mediastinal mass. Electronically Signed   By: San Morelle M.D.   On: 07/14/2015 10:17   Dg Abd 1 View  07/14/2015  CLINICAL DATA:  Fecal impaction.  Pleural effusion. EXAM:  ABDOMEN - 1 VIEW COMPARISON:  One-view abdomen 07/12/2015. FINDINGS: Moderate stool is noted throughout the colon, slightly decreased from the prior exam. There is no obstruction. The bowel gas pattern is otherwise normal. Degenerative changes are again noted in the lower lumbar spine. IMPRESSION: 1. Improving moderate stool burden. 2. No associated obstruction. Electronically Signed   By: San Morelle M.D.   On: 07/14/2015 10:11    ROS: No change in vision + SOB but better No CP + abd distention No dysuria No new arthritic CO  PHYSICAL EXAM: Blood pressure 152/92, pulse 116, temperature 97.6 F (36.4 C), temperature source Oral, resp. rate 20, height '5\' 8"'$  (1.727 m), weight 85.866 kg (189 lb 4.8 oz), SpO2 98 %. HEENT: PERRLA EOMI NECK: Minimal JVD Chest: Rt porta cath LUNGS:Decreased BS bases but clear CARDIAC:Tachy, regular  No MRG ABD:+ BS, mild protuberance, NT No overt hepatomegaly TYO:MAYOK edema Presacral region: Tr edema NEURO:CNI, Ox3 No asterixis  Assessment: 1. Hyponatremia  which may be a combination of things.  He is slightly volume overloaded and will treat this first.  I also wonder how much is related to how the SNa is measured in the setting of MM.  I called the lab and it does appear that an indirect ion selective electrode methodology is being used which can lead to pseudohyponatremia in setting of MM.  Will keep this in mind if SNa does not improve.  In realty, his mild hyponatremia is the least of his problems 2. CKD 3 in setting of chronically obstructed Rt kidney 3. Multiple Myeloma 4. Mild hyperkalemia, lasix should help PLAN: 1. DC IV fluids 2. Fluid restrict 3. PO lasix 4. Daily SNa 4. Check UA 5. Check TSH.  Doubt adrenal insufficiency due to high BP  Deretha Ertle T 07/15/2015, 6:49 PM

## 2015-07-15 NOTE — Progress Notes (Signed)
Pt down for radiation at the Cancer center

## 2015-07-15 NOTE — Progress Notes (Signed)
[facility] PCCM  S:  Mild SOB, increased from yesterday.  R groin pain.    ODanley Danker Vitals:   07/14/15 2127 07/15/15 0601 07/15/15 0604 07/15/15 0626  BP: 142/96 153/111 159/100   Pulse: 118 115    Temp: 97.9 F (36.6 C) 97.7 F (36.5 C)    TempSrc: Oral Oral    Resp: 19 19    Height:      Weight:  85.866 kg (189 lb 4.8 oz)    SpO2: 98% 98%  96%   RA  Gen: pleasant male, in NAD HENT: OP clear, neck supple PULM: even/non-labored, diminished R base, otherwise clear  CV: RRR, no mgr, scant BLE edema  GI: BS+, soft, nontender Derm: no cyanosis or rash Psyche: normal mood and affect   Pathology Pleural Fluid 3/5 >> myeloma involvement.  Cells are positive with the plasma cell marker CD138 and show kappa light chain restriction with in situ hybridization. The cells are negative with cytokeratin AE1/AE3, cytokeratin 5/6, WT-1 and calretinin.   Impression/Plan:  Acute Hypoxic Respiratory Failure - resolved.  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   Rare cause but no other clear cause of the effusion.  He will need a PleurX catheter at some point.  IR has been consulted for evaluation.  However, repeat CXR at this point does not likely have enough pleural fluid for insertion.    Plan: Will have IR eval for pleur-x -- slight increase in effusion today but still may not be enough for pleur-x insertion  If unable to place PleurX per IR while inpatient, he can follow up with [facility] at discharge for tracking of effusion  Trend serial CXR's.   Pulmonary hygiene - IS, mobilize   Discussed at length at bedside with pt, wife and palliative care NP.  Pt does wish to proceed with pleur-x placement if enough fluid, but remains conflicted about overall goals of care.  Still unsure if he wishes to pursue another oncology opinion.  Continue palliative care involvement and goals of care discussions.     Nickolas Madrid, NP 07/15/2015  10:48  AM Pager: [phone] or [phone]  PCCM Attending Note: Patient seen and examined with nurse practitioner. Please refer to her progress note. Patient reports intermittent dyspnea that seems to be somewhat responsive to nebulizer therapy. Denies any new chest pain or pressure. Infrequent cough.  BP 152/92 mmHg  Pulse 116  Temp(Src) 97.6 F (36.4 C) (Oral)  Resp 20  Ht '5\' 8"'$  (1.727 m)  Wt 189 lb 4.8 oz (85.866 kg)  BMI 28.79 kg/m2  SpO2 98% Gen.: Patient sitting up in bed. No distress. Sleeping until awoken. Pulmonary: Diminished breath sounds bilateral lung bases. Normal work of breathing. Speaking in complete sentences. Cardiovascular: Slightly tachycardic. Normal S1 & S2. No appreciable JVD.  CBC Latest Ref Rng 07/12/2015 07/11/2015 07/09/2015  WBC 4.0 - 10.5 K/uL 7.0 7.2 6.6  Hemoglobin 13.0 - 17.0 g/dL 8.6(L) 8.7(L) 8.7(L)  Hematocrit 39.0 - 52.0 % 25.0(L) 26.0(L) 26.0(L)  Platelets 150 - 400 K/uL 176 216 215    BMP Latest Ref Rng 07/15/2015 07/14/2015 07/13/2015  Glucose 65 - 99 mg/dL 128(H) 131(H) 117(H)  BUN 6 - 20 mg/dL 73(H) 76(H) 76(H)  Creatinine 0.61 - 1.24 mg/dL 2.36(H) 2.52(H) 2.81(H)  Sodium 135 - 145 mmol/L 126(L) 128(L) 126(L)  Potassium 3.5 - 5.1 mmol/L 5.3(H) 4.9 4.5  Chloride 101 - 111 mmol/L 96(L) 95(L) 91(L)  CO2  22 - 32 mmol/L 21(L) 21(L) 24  Calcium 8.9 - 10.3 mg/dL 9.5 9.5 9.2   Right PFA Cytology (07/07/15): Malignant cells consistent with plasma cell myeloma.  A/P: 70 year old male with history of multiple myeloma initially diagnosed in 2016 with right chest wall tumor and pain. Patient has had multiple rounds of chemotherapy and underwent a right-sided thoracentesis on 3/5 with removal of 1.6 L of fluid that revealed a malignant pleural effusion. I reviewed the patient's chest x-ray imaging which does appear to show reaccumulation of his right pleural effusion. His previous acute hypoxic respiratory failure has resolved. Currently palliative care is  following.  1. Malignant right pleural effusion: Recommend repeat evaluation for indwelling pleural catheter by interventional radiology. 2. Acute hypoxic respiratory failure: Resolved. 3. Multiple myeloma: Per progress notes case was discussed with oncology who recommended palliative medicine consultation & further follow-up in his clinic with possible referral to [facility].  Further medical care per primary service. We will sign off at this time. Please contact me if there is any clinical deterioration or any new questions arise or we can be of assistance in the care of this patient.  Sonia Baller Ashok Cordia, M.D. [facility] Pulmonary & Critical Care Pager:  [phone] After 3pm or if no response, call [phone] 6:24 PM

## 2015-07-15 NOTE — Care Management Important Message (Signed)
Important Message  Patient Details  Name: Wesley [name] MRN: [id number] Date of Birth: 02/12/1946   Medicare Important Message Given:  Yes    Camillo Flaming 07/15/2015, 9:10 AMImportant Message  Patient Details  Name: Wesley [name] MRN: [id number] Date of Birth: 1945/12/13   Medicare Important Message Given:  Yes    Camillo Flaming 07/15/2015, 9:10 AM

## 2015-07-15 NOTE — Consult Note (Signed)
[place] stopped in to see if Adv. Directive complete; thus far, it is not ready.  Please contact [place] to try and finish HCPOA. 4:50 PM

## 2015-07-15 NOTE — Progress Notes (Signed)
Nutrition Follow-up  DOCUMENTATION CODES:   Severe malnutrition in context of acute illness/injury  INTERVENTION:   - Discontinue Ensure Enlive. - Provide Prostat po TID, each supplement provides 100 kcals and 15 grams of protein. - Provide chopped meats with meals. - RD will continue to monitor for nutrition needs.  NUTRITION DIAGNOSIS:   Malnutrition related to acute illness as evidenced by energy intake < 75% for > 7 days, percent weight loss, moderate depletions of muscle mass.  Ongoing  GOAL:   Patient will meet greater than or equal to 90% of their needs  Progressing  MONITOR:   PO intake, Supplement acceptance, Labs, I & O's, GOC  ASSESSMENT:   Past medical history including multiple myeloma. Diagnosis in 2016. Followed by Dr. [name] at [facility] oncology. Patient presents w/ progressive dyspnea . CT C/A/P shows progression of involvement from MM as stated in image report below. Concerning are the pleural effusions and cardiac involvement   S/p right thoracentesis of 1.6 L by CCM on 3/11.  Patient states that his appetite is starting to improve and is able to eat more food at meals times.  Pt consumed eggs, apple sauce, grits, and toast for breakfast.  Per chart review patient with 50-75% meal completion records.  Pt reports that he has to chew his food a lot to get it to go down.  Denies issues swallowing, stating that he can swallow just fine unless he puts too much food in his mouth.  Pt states that he almost feels too tired to finish his meal after getting his meats cut up and chewing for a long time.  Pt agreeable to receiving chopped meats with meal trays.  States that he has been drinking the Ensure occasionally.  Due to elevated potassium, will discontinue Ensure for the time being.  Pt amenable to trying Prostat protein supplement.    Medications reviewed.  Labs reviewed: low sodium (126), elevated potassium (5.3), elevated BUN/cretinine,   Diet Order:  Diet  renal with fluid restriction Room service appropriate?: Yes; Fluid consistency:: Thin  Skin:  Reviewed, no issues  Last BM:  3/12  Height:   Ht Readings from Last 1 Encounters:  07/10/15 _0  (1.727 m)    Weight:   Wt Readings from Last 1 Encounters:  07/15/15 189 lb 4.8 oz (85.866 kg)    Ideal Body Weight:  70 kg  BMI:  Body mass index is 28.79 kg/(m^2).  Estimated Nutritional Needs:   Kcal:  2100-2300  Protein:  125-135 grams  Fluid:  per MD (fluid restriction)  EDUCATION NEEDS:   No education needs identified at this time  Veronda Prude, Dietetic Intern Pager: [phone]

## 2015-07-15 NOTE — Progress Notes (Signed)
CM continues to follow and assist with discharge planning.  Per nursing staff, pt is ambulating independently to the bathroom in his room.  No CM needs communicated at this time. Marney Doctor RN,BSN,NCM [phone]

## 2015-07-15 NOTE — Progress Notes (Signed)
Pt states feeling SOB, Dr. Algis Liming in to see pt.

## 2015-07-15 NOTE — Progress Notes (Signed)
Daily Progress Note   Patient Name: Wesley [name]       Date: 07/15/2015 DOB: 1945/10/20  Age: 70 y.o. 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  Continued discussion regarding diagnosis, prognosis, GOC, EOL wishes disposition and options.  We discussed his diagnosis of Multiple Myeloma and Mr Gervacio struggles with "how can this all be happening from this disease". Initially he did not follow recommendation because " he didn't believe he had MM"    Attempted to describe some of the pathologic changes occuring with disease  in attempt to help him understand his current situation, we talked to the point that his disease in not curable and  to the limitations of medical interventions.  He shared his scepticism of healthcare in general.   Todays conversation included his son/Greg and his wife.    Concepts specific to code status was detailed   The difference between a aggressive medical intervention path  and a palliative comfort care path for this patient at this time was had.   Values and goals of care important to patient and family were attempted to be elicited.  Concept of Hospice was discussed.  Advanced directive questions addressed  Natural trajectory and expectations at EOL were discussed.  Questions and concerns addressed.   Family encouraged to call with questions or concerns.  PMT will continue to support  holistically.   Patient continues to struggle with trusting the medical system, making decisions regarding treatment options and the  question of seeking a second opinion, and facing his own mortality.  We spoke to his fear of utilizing "too much pain medication", education offered regarding  pain medications, startegy, and  Intention.  He has not had any medication for pain for over 24 hrs.  To try Tramadol today    Length of Stay: 8 days  Current Medications: Scheduled Meds:  . allopurinol  100 mg Oral Daily  . amLODipine  5 mg Oral Daily  . bisacodyl  10 mg Rectal Once  .  dexamethasone  4 mg Oral 4 times per day  . feeding supplement (ENSURE ENLIVE)  237 mL Oral BID BM  . finasteride  5 mg Oral Daily  . hydrALAZINE  50 mg Oral TID  . polyethylene glycol  17 g Oral BID  . senna  2 tablet Oral BID  . sodium chloride flush  10-40 mL Intracatheter Q12H  . sodium chloride flush  3 mL Intravenous Q12H  . tamsulosin  0.4 mg Oral BID    Continuous Infusions:    PRN Meds: albuterol, bisacodyl, hydrALAZINE, HYDROcodone-acetaminophen, ondansetron **OR** [DISCONTINUED] ondansetron (ZOFRAN) IV, sodium chloride flush  Physical Exam: Physical Exam  Constitutional: He appears ill.  Cardiovascular: Normal rate, regular rhythm and normal heart sounds.   Abdominal: He exhibits distension. There is tenderness.  Skin: Skin is warm and dry.                Vital Signs: BP 159/100 mmHg  Pulse 115  Temp(Src) 97.7 F (36.5 C) (Oral)  Resp 19  Ht '5\' 8"'$  (1.727 m)  Wt 85.866 kg (189 lb 4.8 oz)  BMI 28.79 kg/m2  SpO2 96% SpO2: SpO2: 96 % O2 Device: O2 Device: Not Delivered O2 Flow Rate: O2 Flow Rate (L/min): 2 L/min  Intake/output summary:   Intake/Output Summary (Last 24 hours) at 07/15/15 0945 Last data filed at 07/15/15 0800  Gross per 24 hour  Intake 2999.25 ml  Output   1900 ml  Net 1099.25 ml   LBM: Last BM Date: 07/14/15 Baseline Weight: Weight: 88.4 kg (194 lb 14.2  oz) Most recent weight: Weight: 85.866 kg (189 lb 4.8 oz)       Palliative Assessment/Data: Flowsheet Rows        Most Recent Value   Intake Tab    Referral Department  Hospitalist   Unit at Time of Referral  Oncology Unit   Palliative Care Primary Diagnosis  Cancer   Date Notified  07/11/15   Palliative Care Type  Return patient Palliative Care   Reason for referral  Non-pain Symptom, Clarify Goals of Care   Date of Admission  07/07/15   Date first seen by Palliative Care  07/12/15   # of days IP prior to Palliative referral  4   Clinical Assessment    Palliative Performance Scale Score  40%   Pain Max last 24 hours  5   Pain Min Last 24 hours  4   Dyspnea Max Last 24 Hours  4   Dyspnea Min Last 24 hours  3   Psychosocial & Spiritual Assessment    Palliative Care Outcomes    Patient/Family meeting held?  Yes   Who was at the meeting?  patient    Palliative Care Outcomes  Provided advance care planning, Clarified goals of care      Additional Data Reviewed: CBC    Component Value Date/Time   WBC 7.0 07/12/2015 0540   WBC 4.9 07/03/2015 0936   RBC 2.77* 07/12/2015 0540   RBC 2.29* 07/03/2015 0936   HGB 8.6* 07/12/2015 0540   HGB 7.5* 07/03/2015 0936   HCT 25.0* 07/12/2015 0540   HCT 22.9* 07/03/2015 0936   PLT 176 07/12/2015 0540   PLT 223 07/03/2015 0936   MCV 90.3 07/12/2015 0540   MCV 99.9* 07/03/2015 0936   MCH 31.0 07/12/2015 0540   MCH 32.7 07/03/2015 0936   MCHC 34.4 07/12/2015 0540   MCHC 32.8 07/03/2015 0936   RDW 16.7* 07/12/2015 0540   RDW 19.2* 07/03/2015  0936   LYMPHSABS 0.8 07/07/2015 0910   LYMPHSABS 0.4* 07/03/2015 0936   MONOABS 0.5 07/07/2015 0910   MONOABS 1.2* 07/03/2015 0936   EOSABS 0.1 07/07/2015 0910   EOSABS 0.1 07/03/2015 0936   BASOSABS 0.0 07/07/2015 0910   BASOSABS 0.1 07/03/2015 0936    CMP     Component Value Date/Time   NA 126* 07/15/2015 0505   NA 131* 07/03/2015 0936   K 5.3* 07/15/2015 0505   K 4.8 07/03/2015 0936    CL 96* 07/15/2015 0505   CO2 21* 07/15/2015 0505   CO2 20* 07/03/2015 0936   GLUCOSE 128* 07/15/2015 0505   GLUCOSE 150* 07/03/2015 0936   BUN 73* 07/15/2015 0505   BUN 20.8 07/03/2015 0936   CREATININE 2.36* 07/15/2015 0505   CREATININE 2.7* 07/03/2015 0936   CALCIUM 9.5 07/15/2015 0505   CALCIUM 9.1 07/03/2015 0936   PROT 9.0* 07/09/2015 0400   PROT 8.9* 07/03/2015 0936   ALBUMIN 3.5 07/09/2015 0400   ALBUMIN 3.3* 07/03/2015 0936   AST 21 07/09/2015 0400   AST 14 07/03/2015 0936   ALT 14* 07/09/2015 0400   ALT 10 07/03/2015 0936   ALKPHOS 36* 07/09/2015 0400   ALKPHOS 44 07/03/2015 0936   BILITOT 0.6 07/09/2015 0400   BILITOT 1.04 07/03/2015 0936   GFRNONAA 27* 07/15/2015 0505   GFRAA 31* 07/15/2015 0505       Problem List:  Patient Active Problem List   Diagnosis Date Noted  . Bilateral pleural effusion   . Constipation   . Hyponatremia   . Fecal impaction ([place])   . Palliative care encounter   . Counseling regarding advanced directives and goals of care   . Status post thoracentesis   . Acute respiratory failure with hypoxia ([place]) 07/08/2015  . 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 Acute renal failure superimposed on stage 3 chronic kidney disease ([place]) 07/08/2015  . Multiple myeloma ([place])   . Dyspnea 07/07/2015  . Pleural effusion 07/07/2015  . Metastatic cancer ([place]) 07/07/2015  . Lymphadenopathy, supraclavicular 07/03/2015  . Encounter for antineoplastic chemotherapy 05/12/2015  . UTI (urinary tract infection) due to Enterococcus and pseudomonas  03/24/2015  . Sepsis due to Pseudomonas UTI and bacteremia ([place]) 03/24/2015  . Enlarged prostate with urinary obstruction 03/24/2015  . Essential hypertension   . 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 CKD (chronic kidney disease) stage 5, GFR less than 15 ml/min ([facility]) 02/19/2015  . Antineoplastic chemotherapy induced anemia 02/19/2015  . Bilateral hydronephrosis 02/18/2015  . 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       Code Status Orders        Start     Ordered   07/07/15 1331  Full code   Continuous     07/07/15 1330    Code Status History    Date Active Date Inactive Code Status Order ID Comments User Context   03/20/2015  7:22 PM 03/25/2015  2:47 PM Full Code [id number]  Theodis Blaze, MD Inpatient   02/17/2015  4:52 PM 02/23/2015  4:19 PM Full Code [id number]  Theodis Blaze, MD Inpatient   10/22/2014  8:52 AM 10/22/2014  1:09 PM Full Code [id number]  Milly Jakob, MD Inpatient   10/02/2014  9:48 AM 10/03/2014  3:19  AM Full Code [id number]  Arne Cleveland, MD [facility]   11/14/2013 10:03 AM 11/15/2013  3:30 AM Full Code [id number]  Markus Daft, MD [facility]       2. Goals of Care/Additional Recommendations:     Limitations on Scope of Treatment: Full Scope Treatment  Desire for further Chaplaincy support:no  Psycho-social Needs: Caregiving  Support/Resources, Hospice benefit  3. Symptom Management:      1. Tramadol added today  4. Palliative Prophylaxis:   Bowel Regimen  5. Prognosis: Unable to determine  6. Discharge Planning:  pending hospital course   Care plan was discussed with  Patient.   Thank you for allowing the Palliative Medicine Team to assist in the care of this patient.   Time In:  0945 Time Out:  1045 Total Time  60 min Prolonged Time Billed  no        336 Phelps, NP  07/15/2015, 9:45 AM  Please contact Palliative Medicine Team phone at [phone] for questions and concerns.

## 2015-07-16 ENCOUNTER — Ambulatory Visit
Admit: 2015-07-16 | Discharge: 2015-07-16 | Disposition: A | Discharge status: Home / Self Care | Payer: Medicare HMO | Attending: Radiation Oncology | Admitting: Radiation Oncology

## 2015-07-16 ENCOUNTER — Inpatient Hospital Stay (HOSPITAL_COMMUNITY): Payer: Medicare HMO

## 2015-07-16 ENCOUNTER — Encounter: Payer: Self-pay | Admitting: Radiation Oncology

## 2015-07-16 DIAGNOSIS — C9000 Multiple myeloma not having achieved remission: Secondary | ICD-10-CM

## 2015-07-16 DIAGNOSIS — Z510 Encounter for antineoplastic radiation therapy: Secondary | ICD-10-CM | POA: Diagnosis not present

## 2015-07-16 DIAGNOSIS — Z7189 Other specified counseling: Secondary | ICD-10-CM | POA: Insufficient documentation

## 2015-07-16 LAB — CBC
HEMATOCRIT: 24.7 % — AB (ref 39.0–52.0)
HEMOGLOBIN: 8.3 g/dL — AB (ref 13.0–17.0)
MCH: 32.3 pg (ref 26.0–34.0)
MCHC: 33.6 g/dL (ref 30.0–36.0)
MCV: 96.1 fL (ref 78.0–100.0)
Platelets: 157 10*3/uL (ref 150–400)
RBC: 2.57 MIL/uL — ABNORMAL LOW (ref 4.22–5.81)
RDW: 16.9 % — AB (ref 11.5–15.5)
WBC: 6.6 10*3/uL (ref 4.0–10.5)

## 2015-07-16 LAB — RENAL FUNCTION PANEL
ALBUMIN: 3.5 g/dL (ref 3.5–5.0)
ANION GAP: 13 (ref 5–15)
BUN: 80 mg/dL — AB (ref 6–20)
CALCIUM: 9.5 mg/dL (ref 8.9–10.3)
CO2: 21 mmol/L — AB (ref 22–32)
Chloride: 95 mmol/L — ABNORMAL LOW (ref 101–111)
Creatinine, Ser: 2.49 mg/dL — ABNORMAL HIGH (ref 0.61–1.24)
GFR calc Af Amer: 29 mL/min — ABNORMAL LOW (ref >60)
GFR calc non Af Amer: 25 mL/min — ABNORMAL LOW (ref >60)
GLUCOSE: 130 mg/dL — AB (ref 65–99)
PHOSPHORUS: 5 mg/dL — AB (ref 2.5–4.6)
POTASSIUM: 4.6 mmol/L (ref 3.5–5.1)
SODIUM: 129 mmol/L — AB (ref 135–145)

## 2015-07-16 LAB — URINALYSIS, ROUTINE W REFLEX MICROSCOPIC
BILIRUBIN URINE: NEGATIVE
GLUCOSE, UA: NEGATIVE mg/dL
HGB URINE DIPSTICK: NEGATIVE
Ketones, ur: NEGATIVE mg/dL
Leukocytes, UA: NEGATIVE
Nitrite: NEGATIVE
Protein, ur: 30 mg/dL — AB
SPECIFIC GRAVITY, URINE: 1.013 (ref 1.005–1.030)
pH: 5.5 (ref 5.0–8.0)

## 2015-07-16 LAB — URINE MICROSCOPIC-ADD ON
Squamous Epithelial / LPF: NONE SEEN
WBC, UA: NONE SEEN WBC/hpf (ref 0–5)

## 2015-07-16 LAB — TSH: TSH: 1.174 u[IU]/mL (ref 0.350–4.500)

## 2015-07-16 MED ORDER — FENTANYL CITRATE (PF) 100 MCG/2ML IJ SOLN
INTRAMUSCULAR | Status: AC | PRN
  Administered 2015-07-16: 25 ug via INTRAVENOUS

## 2015-07-16 MED ORDER — MIDAZOLAM HCL 2 MG/2ML IJ SOLN
INTRAMUSCULAR | Status: AC | PRN
Start: 2015-07-16 — End: 2015-07-16
  Administered 2015-07-16: 1 mg via INTRAVENOUS

## 2015-07-16 MED ORDER — SONAFINE EX EMUL
1.0000 "application " | Freq: Once | CUTANEOUS | Status: AC
  Administered 2015-07-16: 1 via TOPICAL
  Filled 2015-07-16: qty 45

## 2015-07-16 MED ORDER — CEFAZOLIN SODIUM-DEXTROSE 2-3 GM-% IV SOLR: 2.0000 g | INTRAVENOUS | Status: AC

## 2015-07-16 MED ORDER — LIDOCAINE HCL 1 % IJ SOLN
INTRAMUSCULAR | Status: AC
  Filled 2015-07-16: qty 20

## 2015-07-16 MED ORDER — FENTANYL CITRATE (PF) 100 MCG/2ML IJ SOLN
INTRAMUSCULAR | Status: AC
  Filled 2015-07-16: qty 4

## 2015-07-16 MED ORDER — CEFAZOLIN SODIUM-DEXTROSE 2-3 GM-% IV SOLR
INTRAVENOUS | Status: AC
  Filled 2015-07-16: qty 50

## 2015-07-16 MED ORDER — MIDAZOLAM HCL 2 MG/2ML IJ SOLN
INTRAMUSCULAR | Status: AC
  Filled 2015-07-16: qty 6

## 2015-07-16 NOTE — Progress Notes (Signed)
PROGRESS NOTE    Wesley [name]  KGM:[id number]  DOB: Sep 03, 1945  DOA: 07/07/2015 PCP: [name], MD Outpatient Specialists: Oncology: Dr. 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 [name] who presently is scheduled to start chemotherapy (3rd line therapy due to disease progression) with Carfilzomib, Pomalyst and Decadron presented with progressive dyspnea over the past 1-2 days PTA. Workup in the emergency department showed bilateral pleural effusions on chest x-ray. CT of the chest, abdomen and pelvis on 07/07/2015 revealed a moderate left and large right pleural effusion and several masses in the chest and abdomen concerning for multiple myeloma progression. Right therapeutic and diagnostic thoracentesis on 3/5 with resolution of hypoxia and improvement of symptoms. Concern for reaccumulation of malignant pleural effusion. Pulmonary consulted IR but not much pleural fluid to place Pleurx safely at this time. Outpatient follow-up with pulmonology to determine timing of Pleurx placement. Radiation oncology started radiation. Palliative care consulted for goals of care. Patient transferred to medical floor on 3/8. Ongoing issues with electrolyte abnormalities (recurrent hyponatremia and hyperkalemia)-nephrology consulted 3/13, fecal impaction-improved/resolved, pleural effusion monitoring-IR considering Pleurx catheter placement 3/14. Possible DC home in 1-2 days.  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 - Hypoxia resolved.  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 He and his wife have discussed with me and pulmonologist and at this time he is agreeable to proceed with Pleurx. IR consulted for same. However at this time patient may not have enough pleural fluid for Pleurx catheter placement. Unless he has significant reaccumulation prior to discharge, he may have to follow up as an outpatient with weekly chest x-rays with pulmonology and consider Pleurx catheter at that time. Discussed with Dr. [name], pulmonology on 3/9. Chest x-ray shows worsening pleural effusions. - Doubt pneumonia and hence agree with discontinued IV Rocephin and azithromycin on 3/8. - Discussed with Dr. [name], oncologist on 3/9. He indicated that patient has aggressive form of multiple myeloma and recommends palliative care consulted for goals of care. He will follow-up in the office regarding further chemotherapy and referral to [facility]. 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 - As per IR on 3/10, not enough pleural fluid to place Pleurx catheter. Outpatient follow-up with pulmonology. - Intermittent dyspnea. Chest x-ray shows minimal increase in pleural fluid but not substantial. Dyspnea may be multifactorial related to mediastinal mass and small pleural effusion.  - IR evaluating today to see if Pleurx can be placed.  Progressive multiple myeloma - CT chest, abdomen and pelvis show several masses concerning for myeloma progression. - Oncology input 3/7 appreciated: Continue steroids, radiation oncology consulted for palliative radiation to address enlarging masses and oncology will consider transferring to [facility] as outpatient. - 2-D echo: EF 65-70 percent, grade 1 diastolic dysfunction, no wall motion abnormality and trivial pericardial effusion (CT had  suggested mass effect on pericardium) - Discussed with Dr. [name], oncologist on 3/9. He indicated that patient has aggressive form of multiple myeloma and recommends  palliative care consulted for goals of care. He will follow-up in the office regarding further chemotherapy and referral to [facility]. - Palliative care input appreciated: Essentially at this time patient would wish to continue aggressive medical care.  Hyponatremia - Likely multifactorial - Serum osmolarity: 268 and urine osmolarity: 216. Repeat serum osmolarity: 302 and urine osmolarity 416. - Unclear etiology-? Some relationship to hyperuricemia. Asymptomatic of CNS symptoms. - Recurrent hyponatremia. Sodium 123 on 3/10.? Related to chronic kidney disease. Discussed with nephrologist on call on 3/10: He indicated that it would be difficult to interpret urine and serum osmolarity in the context of chronic kidney disease. He felt that patient was overall volume depleted due to high-dose Lasix patient received early on in admission along with mild sinus tachycardia. He recommended IV normal saline hydration and monitoring BMP. - Continues to have recurrent hyponatremia. Given this and recurrent hyperkalemia, nephrology was consulted for assistance with evaluation and management. - Nephrology consultation 3/13 appreciated: Hyponatremia may be multifactorial-volume overload, pseudohyponatremia related to multiple myeloma. Discontinued IV fluids, fluid restrict and started oral Lasix.TSH normal. -4.799 L since admission. Sodium has improved from 126 greater than 129 but have to monitor trends due to frequent fluctuation since hospitalization.   Hyperkalemia, mild, recurrent - Potassium 5.3 on 3/9. May be secondary to worsening of acute kidney injury. DC Lasix. Provided a dose of Kayexalate. Change diet to renal diet. - Recurrent mild hyperkalemia. Potassium again 5.3 on 3/13. - Nephrology consulted for  assistance-as above. Hyperkalemia for now has resolved.  Hyperuricemia - Uric acid is 12. Started allopurinol on 3/8 .  Acute on stage III chronic kidney disease/chronically obstructed kidney - Baseline creatinine 2.1-2.5 - Now may have worsened due to diuretics. Marland Kitchen  - However creatinine on 3/9 has increased to 3. Lasix was temporarily held and patient was hydrated with IV fluids. Creatinine had improved to 2.36. Nephrology consulted and have resumed oral Lasix 80 mg daily. Creatinine 2.4. Monitor.  Essential hypertension - Mildly Uncontrolled. Increased hydralazine to prior home dose of 50 MG 3 times a day and resumed amlodipine 5 MG daily. - Continue current regimen.  Right hydroureteronephrosis - Some improvement on CT. 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  Constipation/fecal impaction - Started bowel regimen.  - Improved and may have resolved.  Anemia of chronic disease/malignancy and chemotherapy - Stable.   DVT prophylaxis: SCDs Code Status: Full Family Communication: None at bedside. Disposition Plan: Transferred to medical floor 3/8. DC home when medically stable-possibly in the next 24 hours.   Consultants:  Pulmonary and critical care medicine  Medical oncology  Radiation oncology  Palliative care medicine   Nephrology.  Procedures:  Right thoracentesis on 07/07/15  2-D echo 07/07/15: Study Conclusions  - Left ventricle: The cavity size was normal. Systolic function was  vigorous. The estimated ejection fraction was in the range of 65%  to 70%. Wall motion was normal; there were no regional wall  motion abnormalities. Doppler parameters are consistent with  abnormal left ventricular relaxation (grade 1 diastolic  dysfunction). - Left atrium: The atrium was mildly dilated. - Pericardium, extracardiac: A trivial pericardial effusion was  identified. Features were not consistent with tamponade  physiology. There was a  moderate-sized left pleural effusion.  Antimicrobials:  Azithromycin 3/5 > 3/8  IV Rocephin 3/5 > 3/8   Subjective: Having BMs. Continues to have intermittent dyspnea. Made NPO by IR for evaluation today for Pleurx placement. Discussed with IR.  Objective: Filed Vitals:  07/15/15 2037 07/16/15 0410 07/16/15 0509 07/16/15 0940  BP: 139/96 120/60 136/96 155/92  Pulse: 117 122 120   Temp: 97.6 F (36.4 C)  98 F (36.7 C)   TempSrc: Oral  Oral   Resp: _0 Height:      Weight:   85.866 kg (189 lb 4.8 oz)   SpO2: 97% 100% 98%     Intake/Output Summary (Last 24 hours) at 07/16/15 1041 Last data filed at 07/16/15 0941  Gross per 24 hour  Intake 641.67 ml  Output    700 ml  Net -58.33 ml   Filed Weights   07/13/15 0727 07/15/15 0601 07/16/15 0509  Weight: 82.373 kg (181 lb 9.6 oz) 85.866 kg (189 lb 4.8 oz) 85.866 kg (189 lb 4.8 oz)    Exam:  General exam: Pleasant middle-aged male lying comfortably propped up in bed. Respiratory system: diminished breath sounds in the bases but otherwise clear to auscultation. No increased work of breathing. Cardiovascular system: S1 & S2 heard, RRR. No JVD, murmurs, gallops, clicks. Trace ankle edema.  Gastrointestinal system: Abdomen is mildly distended, soft and nontender. Normal bowel sounds heard. Central nervous system: Alert and oriented. No focal neurological deficits. Extremities: Symmetric 5 x 5 power.   Data Reviewed: Basic Metabolic Panel:  Recent Labs Lab 07/12/15 0540 07/13/15 0600 07/14/15 0525 07/15/15 0505 07/16/15 0540  NA 123* 126* 128* 126* 129*  K 4.1 4.5 4.9 5.3* 4.6  CL 87* 91* 95* 96* 95*  CO2 24 24 21* 21* 21*  GLUCOSE 94 117* 131* 128* 130*  BUN 71* 76* 76* 73* 80*  CREATININE 2.95* 2.81* 2.52* 2.36* 2.49*  CALCIUM 8.9 9.2 9.5 9.5 9.5  PHOS  --   --   --   --  5.0*   Liver Function Tests:  Recent Labs Lab 07/16/15 0540  ALBUMIN 3.5   No results for input(s): LIPASE, AMYLASE in the  last 168 hours. No results for input(s): AMMONIA in the last 168 hours. CBC:  Recent Labs Lab 07/11/15 0328 07/12/15 0540 07/16/15 0540  WBC 7.2 7.0 6.6  HGB 8.7* 8.6* 8.3*  HCT 26.0* 25.0* 24.7*  MCV 93.9 90.3 96.1  PLT 216 176 157   Cardiac Enzymes: No results for input(s): CKTOTAL, CKMB, CKMBINDEX, TROPONINI in the last 168 hours. BNP (last 3 results) No results for input(s): PROBNP in the last 8760 hours. CBG: No results for input(s): GLUCAP in the last 168 hours.  Recent Results (from the past 240 hour(s))  Culture, blood (Routine X 2) w Reflex to ID Panel     Status: None   Collection Time: 07/07/15  9:31 AM  Result Value Ref Range Status   Specimen Description BLOOD RIGHT CHEST  Final   Special Requests BOTTLES DRAWN AEROBIC AND ANAEROBIC 5 CC  Final   Culture   Final    NO GROWTH 5 DAYS Performed at [facility]    Report Status 07/12/2015 FINAL  Final  Culture, blood (Routine X 2) w Reflex to ID Panel     Status: None   Collection Time: 07/07/15  9:41 AM  Result Value Ref Range Status   Specimen Description BLOOD RIGHT ANTECUBITAL  Final   Special Requests BOTTLES DRAWN AEROBIC AND ANAEROBIC 5 CC  Final   Culture   Final    NO GROWTH 5 DAYS Performed at [facility]    Report Status 07/12/2015 FINAL  Final  MRSA PCR Screening     Status:  None   Collection Time: 07/07/15  1:09 PM  Result Value Ref Range Status   MRSA by PCR NEGATIVE NEGATIVE Final    Comment:        The GeneXpert MRSA Assay (FDA approved for NASAL specimens only), is one component of a comprehensive MRSA colonization surveillance program. It is not intended to diagnose MRSA infection nor to guide or monitor treatment for MRSA infections.          Studies: Dg Chest 2 View  07/15/2015  CLINICAL DATA:  Known multiple myeloma. Shortness of breath and pleural effusions EXAM: CHEST  2 VIEW COMPARISON:  July 14, 2015 chest radiograph ; chest CT July 07, 2015 FINDINGS:  Fairly small pleural effusions bilaterally remain stable. There is atelectatic change in the bases. No frank edema or consolidation. Heart is upper normal in size with pulmonary vascularity within normal limits. The anterior mediastinal mass is seen on recent CT are less well seen radiographically. There is soft tissue fullness in the anterior mediastinum, stable radiographically. There are surgical clips anteriorly on the left. There is a destructive lytic bone lesion involving much of the right anterior and lateral seventh rib. Associated soft tissue mass present in this area as well. IMPRESSION: Persistent small pleural effusions. Mild bibasilar atelectasis. Anterior mediastinal masses, better seen by CT. Destructive lesion right seventh rib with soft tissue mass. Electronically Signed   By: Lowella Grip III M.D.   On: 07/15/2015 10:12    07/07/15, CT chest, abdomen and pelvis without contrast  IMPRESSION: 1. Small to moderate left and large right pleural effusion. Proportional bilateral volume loss as a result. These are new from 03/20/2015. 2. Mass effect on the anterior heart border may be hemodynamically significant. The jugular veins appear more prominent than they did previously. 3. Bulky multinodular soft tissue mass interposed between the anterior chest wall and anterior pericardium causing mass effect, likely reflecting extensive mediastinal adenopathy. There is similar fullness in the superior mediastinum which likely reflects a combination of venous engorgement and adenopathy. 4. Right chest wall mass persists but is smaller when compared to prior study. 5. Market enlargement of left periaortic mass inseparable from left kidney. This causes significant mass effect upon the left kidney as well as on the tail of the pancreas and stomach. 6. Evidence of new mesenteric adenopathy and peritoneal implants as well as inguinal adenopathy. 7. Severe diffuse bladder wall thickening.  Lobulated inferior posterior bladder mass again identified. 8. Moderate right hydroureteronephrosis slightly less pronounced when compared to the prior study.    Scheduled Meds: . allopurinol  100 mg Oral Daily  . amLODipine  5 mg Oral Daily  . bisacodyl  10 mg Rectal Once  . dexamethasone  4 mg Oral 4 times per day  . feeding supplement (PRO-STAT SUGAR FREE 64)  30 mL Oral TID WC  . finasteride  5 mg Oral Daily  . furosemide  80 mg Oral Daily  . hydrALAZINE  50 mg Oral TID  . polyethylene glycol  17 g Oral BID  . senna  2 tablet Oral BID  . sodium chloride flush  10-40 mL Intracatheter Q12H  . sodium chloride flush  3 mL Intravenous Q12H  . tamsulosin  0.4 mg Oral BID   Continuous Infusions:    Active Problems:   Dyspnea   Pleural effusion   Metastatic cancer ([place])   Acute respiratory failure with hypoxia (HCC)   Malignant pleural effusion   Acute renal failure superimposed on stage 3 chronic  kidney disease ([place])   Multiple myeloma (HCC)   Status post thoracentesis   Palliative care encounter   Counseling regarding advanced directives and goals of care   Bilateral pleural effusion   Constipation   Hyponatremia   Fecal impaction ([place])    Time spent: 15 minutes.    Vernell Leep, MD, FACP, FHM. [facility] Pager [phone]  If 7PM-7AM, please contact night-coverage [email or web address] Password [email or web address] 07/16/2015, 10:41 AM    LOS: 9 days

## 2015-07-16 NOTE — Progress Notes (Signed)
Wesley [name] has completed 4 fractions to his right groin, abdomen and anterior chest.  He is currently in the hospital.  He denies having pain.  He reports having shortness of breath, more with activity.  He denies trouble swallowing.  He reports his voice is hoarse.  He denies having nausea, diarrhea, and skin irritation .  He reports feeling fatigued.

## 2015-07-16 NOTE — Sedation Documentation (Signed)
There is not enough fluid in the pleural space for Dr. Laurence Ferrari to safely place a pleureX catheter.  Procedure canceled per Dr. Laurence Ferrari and antibiotic stopped.  Pt to bed transported back to his room.

## 2015-07-16 NOTE — Progress Notes (Signed)
Assessment: 1. Hyponatremia which may be a combination of things. He is slightly volume overloaded. I also wonder how much is related to how the SNa is measured in the setting of MM.  The lab uses an indirect ion selective electrode methodology is being used which can lead to pseudohyponatremia in setting of MM. Will keep this in mind if SNa does not improve. In realty, his mild hyponatremia is the least of his problems, but slightly improved. 2. CKD 3 in setting of chronically obstructed Rt kidney 3. Multiple Myeloma 4. Mild hyperkalemia, lasix should help  PLAN: 1. Fluid restrict 2. PO lasix, may need to stop if worse azotemia 3. Daily SNa   Subjective: Interval History: Possible PluerX  Objective: Vital signs in last 24 hours: Temp:  [97.2 F (36.2 C)-98 F (36.7 C)] 97.2 F (36.2 C) (03/14 1325) Pulse Rate:  [117-123] 119 (03/14 1542) Resp:  [18-20] 18 (03/14 1325) BP: (120-155)/(60-96) 135/90 mmHg (03/14 1542) SpO2:  [97 %-100 %] 97 % (03/14 1442) Weight:  [85.866 kg (189 lb 4.8 oz)] 85.866 kg (189 lb 4.8 oz) (03/14 0509) Weight change: 0 kg (0 lb)  Intake/Output from previous day: 03/13 0701 - 03/14 0700 In: 2231.7 [P.O.:740; I.V.:1491.7] Out: 700 [Urine:700] Intake/Output this shift: Total I/O In: 10 [I.V.:10] Out: 250 [Urine:250]  Head: Normocephalic, 2x2cm lesion on left forehead GI: soft Extremities: tr to 1+  Lab Results:  Recent Labs  07/16/15 0540  WBC 6.6  HGB 8.3*  HCT 24.7*  PLT 157   BMET:  Recent Labs  07/15/15 0505 07/16/15 0540  NA 126* 129*  K 5.3* 4.6  CL 96* 95*  CO2 21* 21*  GLUCOSE 128* 130*  BUN 73* 80*  CREATININE 2.36* 2.49*  CALCIUM 9.5 9.5   No results for input(s): PTH in the last 72 hours. Iron Studies: No results for input(s): IRON, TIBC, TRANSFERRIN, FERRITIN in the last 72 hours. Studies/Results: Dg Chest 2 View  07/15/2015  CLINICAL DATA:  Known multiple myeloma. Shortness of breath and pleural effusions  EXAM: CHEST  2 VIEW COMPARISON:  July 14, 2015 chest radiograph ; chest CT July 07, 2015 FINDINGS: Fairly small pleural effusions bilaterally remain stable. There is atelectatic change in the bases. No frank edema or consolidation. Heart is upper normal in size with pulmonary vascularity within normal limits. The anterior mediastinal mass is seen on recent CT are less well seen radiographically. There is soft tissue fullness in the anterior mediastinum, stable radiographically. There are surgical clips anteriorly on the left. There is a destructive lytic bone lesion involving much of the right anterior and lateral seventh rib. Associated soft tissue mass present in this area as well. IMPRESSION: Persistent small pleural effusions. Mild bibasilar atelectasis. Anterior mediastinal masses, better seen by CT. Destructive lesion right seventh rib with soft tissue mass. Electronically Signed   By: Bretta Bang III M.D.   On: 07/15/2015 10:12   [place] Chest  07/16/2015  CLINICAL DATA:  Right pleural effusion prior to pleural catheter placement. EXAM: CHEST ULTRASOUND COMPARISON:  Chest x-ray July 15, 2015 FINDINGS: Bilateral pleural effusions are identified. IMPRESSION: Bilateral pleural effusions identified. Electronically Signed   By: Sherian Rein M.D.   On: 07/16/2015 12:01    Scheduled: . allopurinol  100 mg Oral Daily  . amLODipine  5 mg Oral Daily  . bisacodyl  10 mg Rectal Once  .  ceFAZolin (ANCEF) IV  2 g Intravenous to XRAY  . dexamethasone  4 mg Oral 4 times  per day  . feeding supplement (PRO-STAT SUGAR FREE 64)  30 mL Oral TID WC  . finasteride  5 mg Oral Daily  . furosemide  80 mg Oral Daily  . hydrALAZINE  50 mg Oral TID  . polyethylene glycol  17 g Oral BID  . senna  2 tablet Oral BID  . sodium chloride flush  10-40 mL Intracatheter Q12H  . sodium chloride flush  3 mL Intravenous Q12H  . tamsulosin  0.4 mg Oral BID    LOS: 9 days   Elwyn Lowden C 07/16/2015,4:32 PM

## 2015-07-16 NOTE — Progress Notes (Signed)
Daily Progress Note   Patient Name: Wesley [name]       Date: 07/16/2015 DOB: [birth year]  Age: 70 y.o. 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 Values and goals of care important to patient and family were attempted to be elicited.  Todays conversation included  his wife.   I shared with him information from Dr Worthy Flank  office specific to recommendation regarding treatment of his MM.  Dr Inda Merlin would like to see patient in OP clinic once he is discharge and completes radiation, he has other treatment to offer and also will make referral to [facility] for consultation.  Patient and his wife verbalize understanding and remain hopeful for treatment and improvement   The difference between a aggressive medical intervention path  and a palliative comfort care path for this patient at this time was had.      Questions and concerns addressed.   Family encouraged to call with questions or concerns.  PMT will continue to support holistically.  He reports utilization of Tramadol with better pain control    Length of Stay: 9 days  Current Medications: Scheduled Meds:  . allopurinol  100 mg Oral Daily  . amLODipine  5 mg Oral Daily  . bisacodyl  10 mg Rectal Once  . dexamethasone  4 mg Oral 4 times per day  . feeding supplement (PRO-STAT SUGAR FREE 64)  30 mL Oral TID WC  .  finasteride  5 mg Oral Daily  . furosemide  80 mg Oral Daily  . hydrALAZINE  50 mg Oral TID  . polyethylene glycol  17 g Oral BID  . senna  2 tablet Oral BID  . sodium chloride flush  10-40 mL Intracatheter Q12H  . sodium chloride flush  3 mL Intravenous Q12H  . tamsulosin  0.4 mg Oral BID    Continuous Infusions:    PRN Meds: albuterol, bisacodyl, hydrALAZINE, HYDROcodone-acetaminophen, ondansetron **OR** [DISCONTINUED] ondansetron (ZOFRAN) IV, sodium chloride flush, traMADol  Physical Exam: Physical Exam  Constitutional: He appears ill.  Cardiovascular: Normal rate, regular rhythm and normal heart sounds.   Abdominal: He exhibits distension. There is tenderness.  Skin: Skin is warm and dry.                Vital Signs: BP 155/92 mmHg  Pulse 120  Temp(Src) 98 F (36.7 C) (Oral)  Resp 20  Ht '5\' 8"'$  (1.727 m)  Wt 85.866 kg (189 lb 4.8 oz)  BMI 28.79 kg/m2  SpO2 98% SpO2: SpO2: 98 % O2 Device: O2 Device: Not Delivered O2 Flow Rate: O2 Flow Rate (L/min): 2 L/min  Intake/output summary:   Intake/Output Summary (Last 24 hours) at 07/16/15 1301 Last data filed at 07/16/15 1050  Gross per 24 hour  Intake 401.67 ml  Output    950 ml  Net -548.33 ml   LBM: Last BM Date: 07/15/15 Baseline Weight: Weight: 88.4 kg (194 lb 14.2 oz) Most recent weight: Weight: 85.866 kg (189 lb 4.8 oz)       Palliative Assessment/Data: Flowsheet Rows        Most Recent Value   Intake Tab    Referral Department  Hospitalist   Unit at Time of Referral  Oncology Unit   Palliative Care Primary Diagnosis  Cancer   Date Notified  07/11/15   Palliative Care Type  Return patient Palliative Care   Reason for referral  Non-pain Symptom, Clarify Goals of Care   Date of Admission  07/07/15   Date first seen by Palliative Care  07/12/15   # of days IP prior to Palliative referral  4   Clinical Assessment    Palliative Performance Scale Score  40%   Pain Max last 24 hours  5   Pain Min Last 24  hours  4   Dyspnea Max Last 24 Hours  4   Dyspnea Min Last 24 hours  3   Psychosocial & Spiritual Assessment    Palliative Care Outcomes    Patient/Family meeting held?  Yes   Who was at the meeting?  patient    Palliative Care Outcomes  Provided advance care planning, Clarified goals of care      Additional Data Reviewed: CBC    Component Value Date/Time   WBC 6.6 07/16/2015 0540   WBC 4.9 07/03/2015 0936   RBC 2.57* 07/16/2015 0540   RBC 2.29* 07/03/2015 0936   HGB 8.3* 07/16/2015 0540   HGB 7.5* 07/03/2015 0936   HCT 24.7* 07/16/2015 0540   HCT 22.9* 07/03/2015 0936   PLT 157 07/16/2015 0540   PLT 223 07/03/2015 0936   MCV 96.1 07/16/2015 0540   MCV 99.9* 07/03/2015 0936   MCH 32.3 07/16/2015 0540   MCH 32.7 07/03/2015 0936   MCHC 33.6 07/16/2015 0540   MCHC 32.8 07/03/2015 0936   RDW 16.9* 07/16/2015 0540   RDW 19.2* 07/03/2015 0936   LYMPHSABS 0.8 07/07/2015 0910   LYMPHSABS 0.4* 07/03/2015 0936   MONOABS 0.5 07/07/2015 0910   MONOABS 1.2* 07/03/2015 0936   EOSABS 0.1 07/07/2015 0910   EOSABS 0.1 07/03/2015 0936   BASOSABS 0.0 07/07/2015 0910   BASOSABS 0.1 07/03/2015 0936    CMP     Component Value Date/Time   NA 129* 07/16/2015 0540   NA 131* 07/03/2015 0936   K 4.6 07/16/2015 0540   K 4.8 07/03/2015 0936   CL 95* 07/16/2015 0540   CO2 21* 07/16/2015 0540   CO2 20* 07/03/2015 0936   GLUCOSE 130* 07/16/2015 0540  GLUCOSE 150* 07/03/2015 0936   BUN 80* 07/16/2015 0540   BUN 20.8 07/03/2015 0936   CREATININE 2.49* 07/16/2015 0540   CREATININE 2.7* 07/03/2015 0936   CALCIUM 9.5 07/16/2015 0540   CALCIUM 9.1 07/03/2015 0936   PROT 9.0* 07/09/2015 0400   PROT 8.9* 07/03/2015 0936   ALBUMIN 3.5 07/16/2015 0540   ALBUMIN 3.3* 07/03/2015 0936   AST 21 07/09/2015 0400   AST 14 07/03/2015 0936   ALT 14* 07/09/2015 0400   ALT 10 07/03/2015 0936   ALKPHOS 36* 07/09/2015 0400   ALKPHOS 44 07/03/2015 0936   BILITOT 0.6 07/09/2015 0400   BILITOT 1.04  07/03/2015 0936   GFRNONAA 25* 07/16/2015 0540   GFRAA 29* 07/16/2015 0540       Problem List:  Patient Active Problem List   Diagnosis Date Noted  . Bilateral pleural effusion   . Constipation   . Hyponatremia   . Fecal impaction (HCC)   . Palliative care encounter   . Counseling regarding advanced directives and goals of care   . Status post thoracentesis   . Acute respiratory failure with hypoxia (HCC) 07/08/2015  . 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 Acute renal failure superimposed on stage 3 chronic kidney disease (HCC) 07/08/2015  . Multiple myeloma (HCC)   . Dyspnea 07/07/2015  . Pleural effusion 07/07/2015  . Metastatic cancer (HCC) 07/07/2015  . Lymphadenopathy, supraclavicular 07/03/2015  . Encounter for antineoplastic chemotherapy 05/12/2015  . UTI (urinary tract infection) due to Enterococcus and pseudomonas  03/24/2015  . Sepsis due to Pseudomonas UTI and bacteremia (HCC) 03/24/2015  . Enlarged prostate with urinary obstruction 03/24/2015  . Essential hypertension   . 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 CKD (chronic kidney disease) stage 5, GFR less than 15 ml/min (HCC) 02/19/2015  . Antineoplastic chemotherapy induced anemia 02/19/2015  . Bilateral hydronephrosis 02/18/2015  . Kappa light chain myeloma (HCC) 03/12/2014     Palliative Care Assessment & Plan    1.Code Status:   Full code -we talked about the details of resuscitation and he is strongly encouraged to consider DNR status knowing its limitations in similar patients.       Code Status Orders        Start     Ordered   07/07/15 1331  Full code   Continuous     07/07/15 1330    Code Status History    Date Active Date Inactive Code Status Order ID Comments User Context   03/20/2015  7:22 PM 03/25/2015  2:47 PM Full Code [id number]  Dorothea Ogle, MD Inpatient   02/17/2015  4:52 PM 02/23/2015  4:19 PM Full Code [id number]  Dorothea Ogle, MD Inpatient   10/22/2014  8:52 AM 10/22/2014   1:09 PM Full Code [id number]  Mack Hook, MD Inpatient   10/02/2014  9:48 AM 10/03/2014  3:19 AM Full Code [id number]  Oley Balm, MD HOV   11/14/2013 10:03 AM 11/15/2013  3:30 AM Full Code [id number]  Richarda Overlie, MD HOV       2. Goals of Care/Additional Recommendations:     Limitations on Scope of Treatment: Full Scope Treatment  Desire for further Chaplaincy support:no  Psycho-social Needs: Caregiving  Support/Resources, Hospice benefit  3. Symptom Management:      1. Pain: utilizing  Tramadol with acceptable pain control  4. Palliative Prophylaxis:   Bowel Regimen  5. Prognosis: Unable to determine  6. Discharge Planning:  pending hospital course   Care plan was discussed with  Dr Lew Dawes RN [place]   Thank you for allowing the Palliative Medicine Team to assist in the care of this patient.   Time In:  1100 Time Out:  1135 Total Time  35 min Prolonged Time Billed  no        [address] [place], NP  07/16/2015, 1:01 PM  Please contact Palliative Medicine Team phone at [phone] for questions and concerns.

## 2015-07-16 NOTE — Progress Notes (Signed)
Patient's [place] today reveals enough fluid to try and place PleurX catheter.  The patient is NPO.  We will attempt placement today if time allows.  If unable, then we will place tomorrow.  PE: Heart: regular Lungs: CTAB anteriorly  Andretta Ergle E 3:21 PM 07/16/2015

## 2015-07-16 NOTE — Progress Notes (Signed)
Previous diet order resumed per MD order.

## 2015-07-16 NOTE — Progress Notes (Signed)
Pt here for patient teaching.  Pt given Radiation and You booklet and Sonafine. Pt reports they have not watched the Radiation Therapy Education video.  Reviewed areas of pertinence such as diarrhea, fatigue, hair loss, nausea and vomiting, skin changes and throat changes . Pt able to give teach back of to pat skin, use unscented/gentle soap and have Imodium on hand,apply Sonafine bid and avoid applying anything to skin within 4 hours of treatment. Pt demonstrated understanding and verbalizes understanding of information given and will contact nursing with any questions or concerns.     [email or web address]

## 2015-07-16 NOTE — Progress Notes (Signed)
  Radiation Oncology         [phone] ________________________________  Name: Wesley [name] MRN: [id number]  Date: 07/16/2015  DOB: 1945-12-17  Weekly Radiation Therapy Management - Inpatient    ICD-9-CM ICD-10-CM   1. Kappa light chain myeloma (HCC) 203.00 C90.00      Current Dose: 10 Gy     Planned Dose:  20 Gy  Narrative . . . . . . . . The patient presents for routine under treatment assessment.                                   The patient continues to be admitted to the hospital for management of his multiple issues. Tentative plans are for him to be discharged in one to 2 days. Spoke with Dr. [name] concerning treatment to the left kidney mass. To avoid potential damage to the left kidney we will stop the patient's radiation treatment to this area as of today (10 gray)                                 Set-up films were reviewed.                                 The chart was checked. Physical Findings. . . No change in the size of the left supraclavicular mass. Lungs are clear. The heart has a regular rhythm and rate. Impression . . . . . . . The patient is tolerating radiation. Plan . . . . . . . . . . . . Continue treatment as planned.  ________________________________   Blair Promise, PhD, MD

## 2015-07-17 ENCOUNTER — Other Ambulatory Visit: Payer: Medicare HMO

## 2015-07-17 ENCOUNTER — Ambulatory Visit: Payer: Medicare HMO | Admitting: Internal Medicine

## 2015-07-17 ENCOUNTER — Ambulatory Visit: Payer: Medicare HMO

## 2015-07-17 DIAGNOSIS — C801 Malignant (primary) neoplasm, unspecified: Secondary | ICD-10-CM

## 2015-07-17 DIAGNOSIS — G893 Neoplasm related pain (acute) (chronic): Secondary | ICD-10-CM

## 2015-07-17 DIAGNOSIS — E871 Hypo-osmolality and hyponatremia: Secondary | ICD-10-CM

## 2015-07-17 DIAGNOSIS — Z66 Do not resuscitate: Secondary | ICD-10-CM

## 2015-07-17 DIAGNOSIS — J90 Pleural effusion, not elsewhere classified: Secondary | ICD-10-CM

## 2015-07-17 LAB — RENAL FUNCTION PANEL
ALBUMIN: 3.4 g/dL — AB (ref 3.5–5.0)
Anion gap: 15 (ref 5–15)
BUN: 98 mg/dL — AB (ref 6–20)
CALCIUM: 9.6 mg/dL (ref 8.9–10.3)
CO2: 21 mmol/L — ABNORMAL LOW (ref 22–32)
CREATININE: 3 mg/dL — AB (ref 0.61–1.24)
Chloride: 95 mmol/L — ABNORMAL LOW (ref 101–111)
GFR calc Af Amer: 23 mL/min — ABNORMAL LOW (ref >60)
GFR, EST NON AFRICAN AMERICAN: 20 mL/min — AB (ref >60)
GLUCOSE: 122 mg/dL — AB (ref 65–99)
Phosphorus: 5.4 mg/dL — ABNORMAL HIGH (ref 2.5–4.6)
Potassium: 4.4 mmol/L (ref 3.5–5.1)
SODIUM: 131 mmol/L — AB (ref 135–145)

## 2015-07-17 MED ORDER — MORPHINE SULFATE (CONCENTRATE) 10 MG/0.5ML PO SOLN
5.0000 mg | ORAL | Status: DC | PRN
  Administered 2015-07-17: 5 mg via ORAL
  Filled 2015-07-17: qty 0.5

## 2015-07-17 NOTE — Clinical Documentation Improvement (Signed)
Internal Medicine   Possible Conditions:         Malnutrition  Document Severity - Severe(third degree), Moderate (second degree), Mild (first degree)  Other condition  Unable to clinically determine  Document any associated diagnoses/conditions Please update your documentation within the medical record to reflect your response to this query. Thank you.  Supporting Information:(As per Nutritional Assessment by the RD)  DOCUMENTATION CODES:  Severe malnutrition in context of acute illness/injury   INTERVENTION:  - Discontinue Ensure Enlive.  - Provide Prostat po TID, each supplement provides 100 kcals and 15 grams of protein.  - Provide chopped meats with meals.  - RD will continue to monitor for nutrition needs.   NUTRITION DIAGNOSIS:  Malnutrition related to acute illness as evidenced by energy intake < 75% for > 7 days, percent weight loss, moderate depletions of muscle mass.   Please exercise your independent, professional judgment when responding. A specific answer is not anticipated or expected.  Thank You, Alessandra Grout, RN, BSN, CCDS,Clinical Documentation Specialist:  [phone]  [phone]=Cell - Health Information Management

## 2015-07-17 NOTE — Progress Notes (Signed)
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 [name] who presently is scheduled to start chemotherapy (3rd line therapy due to disease progression) with Carfilzomib, Pomalyst and Decadron presented with progressive dyspnea over the past 1-2 days PTA. Workup in the emergency department showed bilateral pleural effusions on chest x-ray. CT of the chest, abdomen and pelvis on 07/07/2015 revealed a moderate left and large right pleural effusion and several masses in the chest and abdomen concerning for multiple myeloma progression. Right therapeutic and diagnostic thoracentesis on 3/5 with resolution of hypoxia and improvement of symptoms. Concern for reaccumulation of malignant pleural effusion. Pulmonary consulted IR but not much pleural fluid to place Pleurx safely at this time. Outpatient follow-up with pulmonology to determine timing of Pleurx placement. Radiation oncology started radiation. Palliative care consulted for goals of care. Patient transferred to medical floor on 3/8. Ongoing issues with electrolyte abnormalities (recurrent hyponatremia and hyperkalemia)-nephrology consulted 3/13, fecal impaction-improved/resolved, pleural effusion monitoring-IR considering Pleurx catheter placement 3/14. Possible DC home in 1-2 days.  Assessment & Plan:  Acute respiratory failure with hypoxia - Secondary to malignant pleural effusion related to myeloma progression - Status post right thoracentesis of 1.6 L by CCM. 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 He and his wife have discussed with me and pulmonologist and at this time he is agreeable to proceed with Pleurx. IR consulted for same. However at this time patient may not have enough pleural fluid for Pleurx catheter placement. Unless he has significant reaccumulation prior to discharge, he may have to follow up as an outpatient with weekly chest x-rays with pulmonology and consider Pleurx catheter at that time. Discussed with Dr. [name], pulmonology on 3/9. Chest x-ray shows worsening pleural effusions. - Doubt pneumonia and hence agree with discontinued IV Rocephin and azithromycin on 3/8. - Discussed with Dr. [name], oncologist on 3/9. He indicated that patient has aggressive form of multiple myeloma and recommends palliative care consulted for goals of care. He will follow-up in the office regarding further chemotherapy and referral to [facility]. 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 - As per IR on 3/10, not enough pleural fluid to place Pleurx catheter. Outpatient follow-up with pulmonology. - Intermittent dyspnea. Chest x-ray shows minimal increase in pleural fluid but not substantial. Dyspnea may be multifactorial related to mediastinal mass and small pleural effusion.  - IR evaluating today to see if Pleurx can be placed.  Progressive multiple myeloma - CT chest, abdomen and pelvis show several masses concerning for myeloma progression. - Oncology input 3/7 appreciated: Continue steroids, radiation oncology consulted for palliative radiation to address enlarging masses and oncology will consider transferring to [facility] as outpatient. - 2-D echo: EF 65-70 percent, grade 1 diastolic dysfunction, no wall motion abnormality and trivial  pericardial effusion (CT had suggested mass effect on pericardium) - Discussed with Dr. [name], oncologist on 3/9. He indicated that  patient has aggressive form of multiple myeloma and recommends palliative care consulted for goals of care. He will follow-up in the office regarding further chemotherapy and referral to [facility]. - Palliative care input appreciated: Essentially at this time patient would wish to continue aggressive medical care.  Hyponatremia - Likely multifactorial - Serum osmolarity: 268 and urine osmolarity: 216. Repeat serum osmolarity: 302 and urine osmolarity 416. - Unclear etiology-? Some relationship to hyperuricemia. Asymptomatic of CNS symptoms. - Recurrent hyponatremia. Sodium 123 on 3/10.? Related to chronic kidney disease. Discussed with nephrologist on call on 3/10: He indicated that it would be difficult to interpret urine and serum osmolarity in the context of chronic kidney disease. He felt that patient was overall volume depleted due to high-dose Lasix patient received early on in admission along with mild sinus tachycardia. He recommended IV normal saline hydration and monitoring BMP. - Continues to have recurrent hyponatremia. Given this and recurrent hyperkalemia, nephrology was consulted for assistance with evaluation and management. - Nephrology consultation 3/13 appreciated: Hyponatremia may be multifactorial-volume overload, pseudohyponatremia related to multiple myeloma. Discontinued IV fluids, fluid restrict and started oral Lasix.TSH normal. -4.799 L since admission. Sodium has improved from 126 greater than 129 but have to monitor trends due to frequent fluctuation since hospitalization. -Serum sodium has improved to 131 -Cased discussed with nephrology, Dr. Thora Lance lasix   Hyperkalemia, mild, recurrent - Potassium 5.3 on 3/9. May be secondary to worsening of acute kidney injury. DC Lasix. Provided a dose of Kayexalate.  Change diet to renal diet. - Recurrent mild hyperkalemia. Potassium again 5.3 on 3/13. - Nephrology consulted for assistance-as above. Hyperkalemia for now has resolved.  Hyperuricemia - Uric acid is 12. Started allopurinol on 3/8 .  Acute on stage III chronic kidney disease/chronically obstructed kidney - Baseline creatinine 2.1-2.5 - Now may have worsened due to diuretics. Marland Kitchen  - However creatinine on 3/9 has increased to 3. Lasix was temporarily held and patient was hydrated with IV fluids. Creatinine had improved to 2.36. Nephrology consulted and have resumed oral Lasix 80 mg daily. Creatinine 2.4. Monitor.  Essential hypertension - Mildly Uncontrolled. Increased hydralazine to prior home dose of 50 MG 3 times a day and resumed amlodipine 5 MG daily. - Continue current regimen.  Right hydroureteronephrosis - Some improvement on CT. 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  Constipation/fecal impaction - Started bowel regimen.  - Improved and may have resolved.  Anemia of chronic disease/malignancy and chemotherapy - Stable.  Total time--40 min--case discussed with Dr. Florene Glen and Wadie Lessen, NP (palliative) DVT prophylaxis: SCDs Code Status: Full Family Communication: None at bedside. Disposition Plan: Transferred to medical floor 3/8. DC home vs residential hospice when medically stable-possibly in the next 24 hours.   Consultants:  Pulmonary and critical care medicine  Medical oncology  Radiation oncology  Palliative care medicine   Nephrology.  Procedures:  Right thoracentesis on 07/07/15  2-D echo 07/07/15: Study Conclusions  - Left ventricle: The cavity size was normal. Systolic function was  vigorous. The estimated ejection fraction was in the range of 65%  to 70%. Wall motion was normal; there were no regional wall  motion abnormalities. Doppler parameters are consistent with  abnormal left ventricular relaxation (grade 1  diastolic  dysfunction). - Left atrium: The atrium was mildly dilated. - Pericardium, extracardiac: A trivial pericardial effusion was  identified. Features were not consistent with tamponade  physiology. There was a moderate-sized left pleural effusion.  Antimicrobials:  Azithromycin 3/5 >  3/8  IV Rocephin 3/5 > 3/8   Procedures/Studies: Ct Abdomen Pelvis Wo Contrast  07/07/2015  CLINICAL DATA:  Shortness of breath for 2 weeks, also right groin pain with known hernia in that region, personal history of multiple myeloma and bladder cancer EXAM: CT CHEST, ABDOMEN AND PELVIS WITHOUT CONTRAST TECHNIQUE: Multidetector CT imaging of the chest, abdomen and pelvis was performed following the standard protocol without IV contrast. COMPARISON:  Numerous prior CT scans and radiographs FINDINGS: CT CHEST FINDINGS Mediastinum/Lymph Nodes: On this non contrasted study there is bulky nodularity in the retrosternal region of the superior mediastinum. This likely represents a combination of prominent venous structures +adenopathy. The heart is mildly enlarged, and there is mass effect on the heart anteriorly from bulky multinodular soft tissue density interposed between the anterior chest wall in the anterior pericardium. This extends from left to right measuring 17 cm. This process demonstrates a thickness of 3.4 cm. Lungs/Pleura: There is a small to moderate left pleural effusion and there is a large right pleural effusion. Minimal aerated right lung is involved with atelectasis. Aerated left lung is also involved with mild compressive atelectasis. The lungs are otherwise clear allowing for the presence of respiratory motion. Musculoskeletal: There are bilateral paramedian anterior chest wall masses just inferior to the sternomanubrial joint. These were not present previously. These measure about 2.5 cm in AP diameter maximally. This process continues inferiorly to blend with the soft tissue mass anterior to the  heart described above. Mass along the right lateral seventh rib again identified, smaller than on the prior study, today measuring approximately 5.7 x 3.1 cm as compared to 6.5 x 3.8 cm previously. CT ABDOMEN PELVIS FINDINGS Hepatobiliary: No focal abnormalities given limited non contrasted evaluation. Gallbladder is negative. Pancreas: Pancreatic tail is draped anteriorly around an enlarged mass arising anteriorly off of the left kidney. On 03/20/2015 this mass measured 46 x 50 mm. It is markedly enlarged today measuring 78 x 104 mm. Spleen: No significant abnormalities Adrenals/Urinary Tract: Adrenal glands negative. Right kidney demonstrates mild hydronephrosis. Right hydronephrosis and hydroureter slightly less prominent when compared to 03/20/2015. There is severe diffuse bladder wall thickening, with a lobulated bladder mass again identified similar to prior studies. Stomach/Bowel: Nonobstructive gas pattern. New no focal bowel abnormalities. Vascular/Lymphatic: No evidence of aortic dilatation. There is adenopathy inferior to the aortic bifurcation measuring about 2.7 cm in short axis. There is extensive adenopathy in the bilateral inguinal regions right greater than left. Largest right inguinal lymph node measures about 3.8 cm in short axis. Soft tissue material within the right inguinal canal appears consistent with adenopathy within inguinal hernia. No definite bowel involvement. There is peritoneal nodularity in the pericolic gutter on the left measuring 43 by 25 mm on image number 59. This was not present previously. Mesenteric adenopathy left lower quadrant also new or increased with the largest lymph node measuring 15 mm. Along the right lateral margin of the liver there is a lentiform 5 x 2.5 cm mass indenting liver contour suggesting capsular implant. Reproductive: Bilateral inguinal hernias containing fat. Additionally, as described above, soft tissue nodules are seen within the right inguinal  hernia suggesting adenopathy. Other: There is trace ascites present in the abdomen and pelvis Musculoskeletal: No evidence of vertebra plana. No acute musculoskeletal findings. IMPRESSION: 1. Small to moderate left and large right pleural effusion. Proportional bilateral volume loss as a result. These are new from 03/20/2015. 2. Mass effect on the anterior heart border may be hemodynamically significant. The jugular  veins appear more prominent than they did previously. 3. Bulky multinodular soft tissue mass interposed between the anterior chest wall and anterior pericardium causing mass effect, likely reflecting extensive mediastinal adenopathy. There is similar fullness in the superior mediastinum which likely reflects a combination of venous engorgement and adenopathy. 4. Right chest wall mass persists but is smaller when compared to prior study. 5. Market enlargement of left periaortic mass inseparable from left kidney. This causes significant mass effect upon the left kidney as well as on the tail of the pancreas and stomach. 6. Evidence of new mesenteric adenopathy and peritoneal implants as well as inguinal adenopathy. 7. Severe diffuse bladder wall thickening. Lobulated inferior posterior bladder mass again identified. 8. Moderate right hydroureteronephrosis slightly less pronounced when compared to the prior study. Electronically Signed   By: Skipper Cliche M.D.   On: 07/07/2015 10:33   Dg Chest 1 View  07/08/2015  CLINICAL DATA:  Post right thoracentesis. History of right pulmonary nodule, bladder tumor, multiple myeloma. EXAM: CHEST 1 VIEW COMPARISON:  07/07/2015 FINDINGS: Shallow inspiration. Bilateral pleural effusions again demonstrated. Left pleural effusion appears increased since previous study, possibly due to shallow inspiration. Mild cardiac enlargement. Atelectasis in the lung bases. No pneumothorax. Power port type central venous catheter with tip over the low SVC region. IMPRESSION:  Bilateral pleural effusions and basilar atelectasis, possibly increasing on the left. No pneumothorax. Electronically Signed   By: Lucienne Capers M.D.   On: 07/08/2015 00:32   Dg Chest 2 View  07/15/2015  CLINICAL DATA:  Known multiple myeloma. Shortness of breath and pleural effusions EXAM: CHEST  2 VIEW COMPARISON:  July 14, 2015 chest radiograph ; chest CT July 07, 2015 FINDINGS: Fairly small pleural effusions bilaterally remain stable. There is atelectatic change in the bases. No frank edema or consolidation. Heart is upper normal in size with pulmonary vascularity within normal limits. The anterior mediastinal mass is seen on recent CT are less well seen radiographically. There is soft tissue fullness in the anterior mediastinum, stable radiographically. There are surgical clips anteriorly on the left. There is a destructive lytic bone lesion involving much of the right anterior and lateral seventh rib. Associated soft tissue mass present in this area as well. IMPRESSION: Persistent small pleural effusions. Mild bibasilar atelectasis. Anterior mediastinal masses, better seen by CT. Destructive lesion right seventh rib with soft tissue mass. Electronically Signed   By: Lowella Grip III M.D.   On: 07/15/2015 10:12   Dg Chest 2 View  07/14/2015  CLINICAL DATA:  Bilateral pleural effusions. Personal history of right-sided pulmonary nodule cup multiple myeloma, bladder cancer. EXAM: CHEST - 2 VIEW COMPARISON:  Two-view chest x-ray 07/12/2015. CT of the chest 07/07/2015. FINDINGS: The heart size is exaggerated by low lung volumes. Bilateral pleural effusions are slightly increased. Anterior mediastinal mass is again noted. There is no significant edema. Bibasilar airspace disease likely reflects atelectasis. Surgical clips are again seen in the left axilla. IMPRESSION: 1. Slight increase in bilateral pleural effusions and associated atelectasis. 2. Anterior mediastinal mass. Electronically Signed   By:  San Morelle M.D.   On: 07/14/2015 10:17   Dg Chest 2 View  07/12/2015  CLINICAL DATA:  History of myeloma and bladder cancer. Evaluate bilateral pleural effusions EXAM: CHEST  2 VIEW COMPARISON:  07/10/2015; 07/07/2015; chest CT - 07/07/2015 FINDINGS: Grossly unchanged cardiac silhouette and mediastinal contours with persistent thickening of the bilateral paratracheal stripe secondary to known mediastinal lymphadenopathy. Small right and trace left-sided pleural effusions are  unchanged. Unchanged bibasilar heterogeneous/consolidative opacities. No new focal airspace opacities. No definite evidence of edema. Surgical clips overlie the left axilla. No acute osseus abnormalities. IMPRESSION: 1. Grossly unchanged small right and trace left-sided pleural effusions with associated bibasilar opacities, likely atelectasis. 2. Similar findings compatible with known mediastinal adenopathy. Electronically Signed   By: Sandi Mariscal M.D.   On: 07/12/2015 10:27   Dg Chest 2 View  07/10/2015  CLINICAL DATA:  Pleural effusion. EXAM: CHEST  2 VIEW COMPARISON:  July 09, 2015. FINDINGS: Stable cardiomediastinal silhouette. No pneumothorax is noted. Right internal jugular Port-A-Cath is unchanged with distal tip in expected position of the SVC. Mild bilateral pleural effusions are noted which are increased compared to prior exam. Bibasilar atelectasis or infiltrates are noted. Lytic destruction of lateral portion of right seventh rib is again noted. IMPRESSION: Mild bilateral pleural effusions are noted which are increased compared to prior exam. Bibasilar atelectasis or infiltrates are noted. Electronically Signed   By: Marijo Conception, M.D.   On: 07/10/2015 15:34   Dg Chest 2 View  07/07/2015  CLINICAL DATA:  Shortness of breath for 2 weeks with some anterior left chest pain, history of multiple myeloma and bladder cancer EXAM: CHEST  2 VIEW COMPARISON:  06/26/2015 FINDINGS: Mildly enlarged cardiac silhouette. Elevated  right diaphragm. Bibasilar opacities suggesting a combination of airspace disease and pleural effusion. Stable right Port-A-Cath. IMPRESSION: Bilateral lower lobe opacity right worse than left suggesting a combination of pleural effusion and consolidation. Electronically Signed   By: Skipper Cliche M.D.   On: 07/07/2015 09:05   Dg Chest 2 View  06/26/2015  CLINICAL DATA:  Shortness of breath and cough.  Sharp chest pain. EXAM: CHEST  2 VIEW COMPARISON:  03/20/2015 and CT scan of the chest dated 11/23/2014 FINDINGS: There are new small bilateral pleural effusions. The nodule at the left lung base posteriorly has increased in size and measures 13 mm in diameter. This was approximately 9 mm in size on the prior CT scan. There is suggestion of a new 13 mm nodule at the right lung base laterally adjacent to the mass destroying the lateral aspect of the right seventh rib. That mass appears essentially unchanged. No new bone lesions. There is slight cardiomegaly. Pulmonary vascularity is normal. Power port appears in good position. IMPRESSION: 1. New small bilateral pleural effusions. 2. Slight increase in size of the pulmonary nodule at the left lung base posteriorly. 3. Possible new nodule at the right lung base laterally. 4. No significant change in the mass destroying the lateral aspect of the right seventh rib. Electronically Signed   By: Lorriane Shire M.D.   On: 06/26/2015 12:51   Dg Abd 1 View  07/14/2015  CLINICAL DATA:  Fecal impaction.  Pleural effusion. EXAM: ABDOMEN - 1 VIEW COMPARISON:  One-view abdomen 07/12/2015. FINDINGS: Moderate stool is noted throughout the colon, slightly decreased from the prior exam. There is no obstruction. The bowel gas pattern is otherwise normal. Degenerative changes are again noted in the lower lumbar spine. IMPRESSION: 1. Improving moderate stool burden. 2. No associated obstruction. Electronically Signed   By: San Morelle M.D.   On: 07/14/2015 10:11   Ct  Chest Wo Contrast  07/07/2015  CLINICAL DATA:  Shortness of breath for 2 weeks, also right groin pain with known hernia in that region, personal history of multiple myeloma and bladder cancer EXAM: CT CHEST, ABDOMEN AND PELVIS WITHOUT CONTRAST TECHNIQUE: Multidetector CT imaging of the chest, abdomen and pelvis was  performed following the standard protocol without IV contrast. COMPARISON:  Numerous prior CT scans and radiographs FINDINGS: CT CHEST FINDINGS Mediastinum/Lymph Nodes: On this non contrasted study there is bulky nodularity in the retrosternal region of the superior mediastinum. This likely represents a combination of prominent venous structures +adenopathy. The heart is mildly enlarged, and there is mass effect on the heart anteriorly from bulky multinodular soft tissue density interposed between the anterior chest wall in the anterior pericardium. This extends from left to right measuring 17 cm. This process demonstrates a thickness of 3.4 cm. Lungs/Pleura: There is a small to moderate left pleural effusion and there is a large right pleural effusion. Minimal aerated right lung is involved with atelectasis. Aerated left lung is also involved with mild compressive atelectasis. The lungs are otherwise clear allowing for the presence of respiratory motion. Musculoskeletal: There are bilateral paramedian anterior chest wall masses just inferior to the sternomanubrial joint. These were not present previously. These measure about 2.5 cm in AP diameter maximally. This process continues inferiorly to blend with the soft tissue mass anterior to the heart described above. Mass along the right lateral seventh rib again identified, smaller than on the prior study, today measuring approximately 5.7 x 3.1 cm as compared to 6.5 x 3.8 cm previously. CT ABDOMEN PELVIS FINDINGS Hepatobiliary: No focal abnormalities given limited non contrasted evaluation. Gallbladder is negative. Pancreas: Pancreatic tail is draped  anteriorly around an enlarged mass arising anteriorly off of the left kidney. On 03/20/2015 this mass measured 46 x 50 mm. It is markedly enlarged today measuring 78 x 104 mm. Spleen: No significant abnormalities Adrenals/Urinary Tract: Adrenal glands negative. Right kidney demonstrates mild hydronephrosis. Right hydronephrosis and hydroureter slightly less prominent when compared to 03/20/2015. There is severe diffuse bladder wall thickening, with a lobulated bladder mass again identified similar to prior studies. Stomach/Bowel: Nonobstructive gas pattern. New no focal bowel abnormalities. Vascular/Lymphatic: No evidence of aortic dilatation. There is adenopathy inferior to the aortic bifurcation measuring about 2.7 cm in short axis. There is extensive adenopathy in the bilateral inguinal regions right greater than left. Largest right inguinal lymph node measures about 3.8 cm in short axis. Soft tissue material within the right inguinal canal appears consistent with adenopathy within inguinal hernia. No definite bowel involvement. There is peritoneal nodularity in the pericolic gutter on the left measuring 43 by 25 mm on image number 59. This was not present previously. Mesenteric adenopathy left lower quadrant also new or increased with the largest lymph node measuring 15 mm. Along the right lateral margin of the liver there is a lentiform 5 x 2.5 cm mass indenting liver contour suggesting capsular implant. Reproductive: Bilateral inguinal hernias containing fat. Additionally, as described above, soft tissue nodules are seen within the right inguinal hernia suggesting adenopathy. Other: There is trace ascites present in the abdomen and pelvis Musculoskeletal: No evidence of vertebra plana. No acute musculoskeletal findings. IMPRESSION: 1. Small to moderate left and large right pleural effusion. Proportional bilateral volume loss as a result. These are new from 03/20/2015. 2. Mass effect on the anterior heart  border may be hemodynamically significant. The jugular veins appear more prominent than they did previously. 3. Bulky multinodular soft tissue mass interposed between the anterior chest wall and anterior pericardium causing mass effect, likely reflecting extensive mediastinal adenopathy. There is similar fullness in the superior mediastinum which likely reflects a combination of venous engorgement and adenopathy. 4. Right chest wall mass persists but is smaller when compared to prior study. 5.  Market enlargement of left periaortic mass inseparable from left kidney. This causes significant mass effect upon the left kidney as well as on the tail of the pancreas and stomach. 6. Evidence of new mesenteric adenopathy and peritoneal implants as well as inguinal adenopathy. 7. Severe diffuse bladder wall thickening. Lobulated inferior posterior bladder mass again identified. 8. Moderate right hydroureteronephrosis slightly less pronounced when compared to the prior study. Electronically Signed   By: Skipper Cliche M.D.   On: 07/07/2015 10:33   [place] Chest  07/16/2015  CLINICAL DATA:  Right pleural effusion prior to pleural catheter placement. EXAM: CHEST ULTRASOUND COMPARISON:  Chest x-ray July 15, 2015 FINDINGS: Bilateral pleural effusions are identified. IMPRESSION: Bilateral pleural effusions identified. Electronically Signed   By: Abelardo Diesel M.D.   On: 07/16/2015 12:01   Nm Pulmonary Perf And Vent  06/26/2015  CLINICAL DATA:  Sudden onset chest pain this afternoon with shortness of breath. EXAM: NUCLEAR MEDICINE VENTILATION - PERFUSION LUNG SCAN TECHNIQUE: Ventilation images were obtained in multiple projections using inhaled aerosol Tc-70mDTPA. Perfusion images were obtained in multiple projections after intravenous injection of Tc-922mAA. RADIOPHARMACEUTICALS:  33.3 Technetium-9974mPA aerosol inhalation and 4.3 Technetium-29m27m IV COMPARISON:  Chest x-ray from today. FINDINGS: Ventilation: No focal  ventilation defect. Perfusion: No wedge shaped peripheral perfusion defects to suggest acute pulmonary embolism. IMPRESSION: Normal bilateral lung perfusion. Electronically Signed   By: EricMisty Stanley.   On: 06/26/2015 19:57   Ir Fluoro Rm 30-60 Min  07/16/2015  CLINICAL DATA:  69 y20r old male with recurrent right-sided malignant pleural effusion. EXAM: IR FLOURO RM 0-60 MIN MEDICATIONS: None CONTRAST:  None FLUOROSCOPY TIME:  None PROCEDURE: The right chest was interrogated with ultrasound. There has been reaccumulation of pleural fluid, unfortunately the fluid is only small to moderate in volume. The hypoechoic soft tissue masses replacing the lateral aspect of several right-sided ribs are directly in the way of the needle path access the pleural fluid. There is insufficient fluid for safe PleurX catheter placement at this time. COMPLICATIONS: None FINDINGS: The right chest was interrogated with ultrasound. There has been reaccumulation of pleural fluid, unfortunately the fluid is only small to moderate in volume. The hypoechoic soft tissue masses replacing the lateral aspect of several right-sided ribs are directly in the way of the needle path access the pleural fluid. There is insufficient fluid for safe PleurX catheter placement at this time. IMPRESSION: Unfortunately, despite ultrasound images from earlier today demonstrating reaccumulation of pleural fluid, the pleural fluid is insufficient to allow for PleurX catheter placement at this time largely due to the presence of soft tissue masses in the lateral aspect of the right seventh rib. There needs to be sufficient pleural fluid to allow pleural access above or be low at this level. Recommend discharging patient home and scheduling for outpatient PleurX catheter placement early next week. This should allow sufficient time for fluid reaccumulation. Signed, HeatCriselda Peaches [facility]  Electronically Signed   By: HeatJacqulynn Cadet.   On: 07/16/2015 17:15   Ir Fluoro Rm 30-60 Min  07/12/2015  INDICATION: History of multiple myeloma, post right-sided thoracentesis on 07/07/2014 which yielded approximately 1.6 L of serosanguineous fluid. Cytologic analysis confirmed the presence of malignant cells. As such, request made for placement of an ultrasound and fluoroscopic guided right-sided pleural its catheter for palliative purposes. EXAM: IR FLOURO RM 0-60 MIN COMPARISON:  Chest radiograph - earlier same day; 07/10/2015; 07/09/2015; 07/07/2015; chest CT -  07/07/2015 MEDICATIONS: None. CONTRAST:  None FLUOROSCOPY TIME:  None COMPLICATIONS: None immediate TECHNIQUE: Patient was positioned supine on the fluoroscopy table and preprocedural ultrasound scanning was performed of the right lateral chest. Images were reviewed and the decision was made not to proceed with either ultrasound-guided thoracentesis or tunneled pleural drainage catheter placement FINDINGS: Preprocedural ultrasound scanning of the right chest demonstrated only a trace/small amount of right-sided pleural fluid, not enough to warrant either ultrasound-guided thoracentesis or tunneled pleural drainage catheter at the present time. Additionally, upon questioning of the patient, he denies shortness of breath or chest discomfort. IMPRESSION: Trace/small amount of right-sided pleural fluid, not enough to warrant either ultrasound-guided thoracentesis for tunneled pleural drainage catheter placement. PLAN: Pending the rapidity of pleural fluid reaccumulation, would consider proceeding with therapeutic ultrasound guided thoracentesis prior to subjecting the patient to placement of a palliative tunneled pleural catheter. Electronically Signed   By: Sandi Mariscal M.D.   On: 07/12/2015 12:38   Dg Chest Port 1 View  07/09/2015  CLINICAL DATA:  Follow-up pleural effusion, respiratory failure, metastatic malignancy, acute and chronic renal  failure. EXAM: PORTABLE CHEST 1 VIEW COMPARISON:  Portable chest x-ray of July 07, 2015 FINDINGS: The small pleural effusion on the right is again demonstrated. Inflation of the right lung is improved. There is soft tissue density noted along the lateral thoracic wall with loss of the contour of the right seventh rib. The left lung is well-expanded. Increased density at the left lung base persists. There is a small left pleural effusion. The cardiac silhouette remains enlarged. The pulmonary vascularity is not engorged. The Port-A-Cath appliance tip projects over the midportion of the SVC. There are surgical clips overlying the left lateral chest wall. IMPRESSION: Stable appearance of small bilateral pleural effusions. Left-sided basilar atelectasis is somewhat less conspicuous. Lysis of the lateral aspect of the right seventh rib is better demonstrated today. Electronically Signed   By: David  Martinique M.D.   On: 07/09/2015 07:05   Dg Abd Portable 1v  07/12/2015  CLINICAL DATA:  Myelopathy on a.  Abdominal pain and constipation. EXAM: PORTABLE ABDOMEN - 1 VIEW COMPARISON:  CT 07/07/2015 FINDINGS: There is large amount of gas and fecal matter throughout the colon. Moderate amount of small bowel gas. No free air seen. No acute bone finding. IMPRESSION: Large amount of fecal matter and gas within the colon consistent with the clinical suspicion of constipation. No free air seen. Electronically Signed   By: Nelson Chimes M.D.   On: 07/12/2015 16:02         Subjective: Patient complains of dyspnea with minimal movement. Denies any headache, chest pain, vomiting, diarrhea, abdominal pain. Denies any fevers, chills, hemoptysis, dysuria, hematuria.  Objective: Filed Vitals:   07/16/15 1703 07/16/15 2111 07/17/15 0526 07/17/15 1100  BP: 133/63 135/87 124/87 132/74  Pulse: 123 121 119   Temp:  97.9 F (36.6 C) 97.9 F (36.6 C)   TempSrc:  Oral Oral   Resp: _0 Height:      Weight:   84.278 kg  (185 lb 12.8 oz)   SpO2: 94% 98% 100%     Intake/Output Summary (Last 24 hours) at 07/17/15 1236 Last data filed at 07/17/15 0531  Gross per 24 hour  Intake    360 ml  Output    400 ml  Net    -40 ml   Weight change: -1.588 kg (-3 lb 8 oz) Exam:   General:  Pt is alert, follows commands  appropriately, not in acute distress  HEENT: No icterus, No thrush, No neck mass, [place]/AT  Cardiovascular: RRR, S1/S2, no rubs, no gallops  Respiratory: Diminished breath sounds bilateral.  Abdomen: Soft/+BS, non tender, non distended, no guarding; no hepatosplenomegaly  Extremities: trace LE edema, No lymphangitis, No petechiae, No rashes, no synovitis  Data Reviewed: Basic Metabolic Panel:  Recent Labs Lab 07/13/15 0600 07/14/15 0525 07/15/15 0505 07/16/15 0540 07/17/15 0615  NA 126* 128* 126* 129* 131*  K 4.5 4.9 5.3* 4.6 4.4  CL 91* 95* 96* 95* 95*  CO2 24 21* 21* 21* 21*  GLUCOSE 117* 131* 128* 130* 122*  BUN 76* 76* 73* 80* 98*  CREATININE 2.81* 2.52* 2.36* 2.49* 3.00*  CALCIUM 9.2 9.5 9.5 9.5 9.6  PHOS  --   --   --  5.0* 5.4*   Liver Function Tests:  Recent Labs Lab 07/16/15 0540 07/17/15 0615  ALBUMIN 3.5 3.4*   No results for input(s): LIPASE, AMYLASE in the last 168 hours. No results for input(s): AMMONIA in the last 168 hours. CBC:  Recent Labs Lab 07/11/15 0328 07/12/15 0540 07/16/15 0540  WBC 7.2 7.0 6.6  HGB 8.7* 8.6* 8.3*  HCT 26.0* 25.0* 24.7*  MCV 93.9 90.3 96.1  PLT 216 176 157   Cardiac Enzymes: No results for input(s): CKTOTAL, CKMB, CKMBINDEX, TROPONINI in the last 168 hours. BNP: Invalid input(s): POCBNP CBG: No results for input(s): GLUCAP in the last 168 hours.  Recent Results (from the past 240 hour(s))  MRSA PCR Screening     Status: None   Collection Time: 07/07/15  1:09 PM  Result Value Ref Range Status   MRSA by PCR NEGATIVE NEGATIVE Final    Comment:        The GeneXpert MRSA Assay (FDA approved for NASAL specimens only),  is one component of a comprehensive MRSA colonization surveillance program. It is not intended to diagnose MRSA infection nor to guide or monitor treatment for MRSA infections.      Scheduled Meds: . allopurinol  100 mg Oral Daily  . amLODipine  5 mg Oral Daily  . bisacodyl  10 mg Rectal Once  .  ceFAZolin (ANCEF) IV  2 g Intravenous to XRAY  . dexamethasone  4 mg Oral 4 times per day  . feeding supplement (PRO-STAT SUGAR FREE 64)  30 mL Oral TID WC  . finasteride  5 mg Oral Daily  . hydrALAZINE  50 mg Oral TID  . polyethylene glycol  17 g Oral BID  . senna  2 tablet Oral BID  . sodium chloride flush  10-40 mL Intracatheter Q12H  . sodium chloride flush  3 mL Intravenous Q12H  . tamsulosin  0.4 mg Oral BID   Continuous Infusions:    TAT, DAVID, DO  [facility] Pager [phone]  If 7PM-7AM, please contact night-coverage [email or web address] Password [facility] 07/17/2015, 12:36 PM   LOS: 10 days

## 2015-07-17 NOTE — Progress Notes (Signed)
Unable to place PleurX yesterday due to chest tumor and limited fluid.  Dr. Laurence Ferrari felt as if we could place it once he had some more fluid as an outpatient.  He is stable for dc home from our standpoint and we will schedule him for placement early next week.  Babs Dabbs E 12:24 PM 07/17/2015

## 2015-07-17 NOTE — Progress Notes (Signed)
Assessment: 1 Hyponatremia, improving 2. 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 Multiple Myeloma, advanced  PLAN: 1. Fluid restrict  2. PO lasix discontinued 3. He looks bad to me and I think his prognosis is poor but would defer to Oncology 4. The renal issue cannot be easily controlled given the obstruction and refusal in past of PCN  Subjective: Interval History: No  PleurX placed due to limited fluid.  Objective: Vital signs in last 24 hours: Temp:  [97.9 F (36.6 C)] 97.9 F (36.6 C) (03/15 0526) Pulse Rate:  [119-125] 119 (03/15 0526) Resp:  [14-18] 17 (03/15 0526) BP: (124-147)/(63-90) 132/74 mmHg (03/15 1100) SpO2:  [94 %-100 %] 100 % (03/15 0526) Weight:  [84.278 kg (185 lb 12.8 oz)] 84.278 kg (185 lb 12.8 oz) (03/15 0526) Weight change: -1.588 kg (-3 lb 8 oz)  Intake/Output from previous day: 03/14 0701 - 03/15 0700 In: 370 [P.O.:360; I.V.:10] Out: 650 [Urine:650] Intake/Output this shift:    General appearance: alert and cooperative GI: firm and sl distended Extremities: edema 1+  Lab Results:  Recent Labs  07/16/15 0540  WBC 6.6  HGB 8.3*  HCT 24.7*  PLT 157   BMET:  Recent Labs  07/16/15 0540 07/17/15 0615  NA 129* 131*  K 4.6 4.4  CL 95* 95*  CO2 21* 21*  GLUCOSE 130* 122*  BUN 80* 98*  CREATININE 2.49* 3.00*  CALCIUM 9.5 9.6   No results for input(s): PTH in the last 72 hours. Iron Studies: No results for input(s): IRON, TIBC, TRANSFERRIN, FERRITIN in the last 72 hours. Studies/Results: [place] Chest  07/16/2015  CLINICAL DATA:  Right pleural effusion prior to pleural catheter placement. EXAM: CHEST ULTRASOUND COMPARISON:  Chest x-ray July 15, 2015 FINDINGS: Bilateral pleural effusions are identified. IMPRESSION: Bilateral pleural effusions identified. Electronically Signed   By: Abelardo Diesel M.D.   On: 07/16/2015 12:01   Ir Fluoro Rm 30-60 Min  07/16/2015  CLINICAL DATA:  70 year old male with recurrent  right-sided malignant pleural effusion. EXAM: IR FLOURO RM 0-60 MIN MEDICATIONS: None CONTRAST:  None FLUOROSCOPY TIME:  None PROCEDURE: The right chest was interrogated with ultrasound. There has been reaccumulation of pleural fluid, unfortunately the fluid is only small to moderate in volume. The hypoechoic soft tissue masses replacing the lateral aspect of several right-sided ribs are directly in the way of the needle path access the pleural fluid. There is insufficient fluid for safe PleurX catheter placement at this time. COMPLICATIONS: None FINDINGS: The right chest was interrogated with ultrasound. There has been reaccumulation of pleural fluid, unfortunately the fluid is only small to moderate in volume. The hypoechoic soft tissue masses replacing the lateral aspect of several right-sided ribs are directly in the way of the needle path access the pleural fluid. There is insufficient fluid for safe PleurX catheter placement at this time. IMPRESSION: Unfortunately, despite ultrasound images from earlier today demonstrating reaccumulation of pleural fluid, the pleural fluid is insufficient to allow for PleurX catheter placement at this time largely due to the presence of soft tissue masses in the lateral aspect of the right seventh rib. There needs to be sufficient pleural fluid to allow pleural access above or be low at this level. Recommend discharging patient home and scheduling for outpatient PleurX catheter placement early next week. This should allow sufficient time for fluid reaccumulation. Signed, Criselda Peaches, MD [facility] Electronically Signed   By: Dellis Filbert.D.  On: 07/16/2015 17:15    Scheduled: . allopurinol  100 mg Oral Daily  . amLODipine  5 mg Oral Daily  . bisacodyl  10 mg Rectal Once  .  ceFAZolin (ANCEF) IV  2 g Intravenous to XRAY  . dexamethasone  4 mg Oral 4 times per day  . feeding supplement (PRO-STAT  SUGAR FREE 64)  30 mL Oral TID WC  . finasteride  5 mg Oral Daily  . hydrALAZINE  50 mg Oral TID  . polyethylene glycol  17 g Oral BID  . senna  2 tablet Oral BID  . sodium chloride flush  10-40 mL Intracatheter Q12H  . sodium chloride flush  3 mL Intravenous Q12H  . tamsulosin  0.4 mg Oral BID    LOS: 10 days   Micai Apolinar C 07/17/2015,1:28 PM

## 2015-07-17 NOTE — Progress Notes (Addendum)
Daily Progress Note   Patient Name: Wesley [name]       Date: 07/17/2015 DOB: [birth year]  Age: 70 y.o. 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  Patient tells me he feels "bad and just wants to rest"  Continued discussion regarding diagnosis, prognosis, GOC, EOL wishes disposition and options. Values and goals of care important to patient and family were attempted to be elicited.  Today he is leaning toward comfort, makes decision for DNR and is inquiring about hospice facility   The difference between a aggressive medical intervention path  and a palliative comfort care path for this patient at this time was had.    Questions and concerns addressed.   Family encouraged to call with questions or concerns.  PMT will continue to support holistically.     Length of Stay: 10 days  Current Medications: Scheduled Meds:  . allopurinol  100 mg Oral Daily  . amLODipine  5 mg Oral Daily  . bisacodyl  10 mg Rectal Once  . dexamethasone  4 mg Oral 4 times per day  . feeding supplement (PRO-STAT SUGAR FREE 64)  30 mL Oral TID WC  . finasteride  5 mg Oral Daily  . hydrALAZINE  50 mg Oral TID  . polyethylene glycol  17 g Oral BID  . senna  2 tablet Oral BID  . sodium chloride flush  10-40 mL Intracatheter Q12H  . sodium chloride flush  3 mL Intravenous Q12H  . tamsulosin  0.4 mg Oral BID    Continuous Infusions:     PRN Meds: albuterol, bisacodyl, hydrALAZINE, morphine CONCENTRATE, ondansetron **OR** [DISCONTINUED] ondansetron (ZOFRAN) IV, sodium chloride flush, traMADol  Physical Exam: Physical Exam  Constitutional: He appears ill.  Cardiovascular: Normal rate, regular rhythm and normal heart sounds.   Abdominal: He exhibits distension. There is tenderness.  Skin: Skin is warm and dry.                Vital Signs: BP 140/86 mmHg  Pulse  120  Temp(Src) 97.1 F (36.2 C) (Oral)  Resp 18  Ht _0  (1.727 m)  Wt 84.278 kg (185 lb 12.8 oz)  BMI 28.26 kg/m2  SpO2 97% SpO2: SpO2: 97 % O2 Device: O2 Device: Not Delivered O2 Flow Rate: O2 Flow Rate (L/min): 2 L/min  Intake/output summary:   Intake/Output Summary (Last 24 hours) at 07/17/15 1655 Last data filed at 07/17/15 0531  Gross per 24 hour  Intake    360 ml  Output    400 ml  Net    -40 ml   LBM: Last BM Date: 07/15/15 Baseline Weight: Weight: 88.4 kg (194 lb 14.2 oz) Most recent weight: Weight: 84.278 kg (185 lb 12.8 oz)       Palliative Assessment/Data: Flowsheet Rows        Most Recent Value   Intake Tab    Referral Department  Hospitalist   Unit at Time of Referral  Oncology Unit   Palliative Care Primary Diagnosis  Cancer   Date Notified  07/11/15   Palliative Care Type  Return patient Palliative Care   Reason for referral  Non-pain Symptom, Clarify Goals of Care   Date of Admission  07/07/15   Date first seen by Palliative Care  07/12/15   # of days IP prior to Palliative referral  4   Clinical Assessment    Palliative Performance Scale Score  40%   Pain Max last 24 hours  5   Pain Min Last 24 hours  4   Dyspnea Max Last 24 Hours  4   Dyspnea Min Last 24 hours  3   Psychosocial & Spiritual Assessment    Palliative Care Outcomes    Patient/Family meeting held?  Yes   Who was at the meeting?  patient    Palliative Care Outcomes  Provided advance care planning, Clarified goals of care      Additional Data  Reviewed: CBC    Component Value Date/Time   WBC 6.6 07/16/2015 0540   WBC 4.9 07/03/2015 0936   RBC 2.57* 07/16/2015 0540   RBC 2.29* 07/03/2015 0936   HGB 8.3* 07/16/2015 0540   HGB 7.5* 07/03/2015 0936   HCT 24.7* 07/16/2015 0540   HCT 22.9* 07/03/2015 0936   PLT 157 07/16/2015 0540   PLT 223 07/03/2015 0936   MCV 96.1 07/16/2015 0540   MCV 99.9* 07/03/2015 0936   MCH 32.3 07/16/2015 0540   MCH 32.7 07/03/2015 0936   MCHC 33.6 07/16/2015 0540   MCHC 32.8 07/03/2015 0936   RDW 16.9* 07/16/2015 0540   RDW 19.2* 07/03/2015 0936   LYMPHSABS 0.8 07/07/2015 0910   LYMPHSABS 0.4* 07/03/2015 0936   MONOABS 0.5 07/07/2015 0910   MONOABS 1.2* 07/03/2015 0936   EOSABS 0.1 07/07/2015 0910   EOSABS 0.1 07/03/2015 0936   BASOSABS 0.0 07/07/2015 0910   BASOSABS 0.1 07/03/2015 0936    CMP     Component Value Date/Time   NA 131* 07/17/2015 0615   NA 131* 07/03/2015 0936   K 4.4 07/17/2015 0615   K 4.8 07/03/2015 0936   CL 95* 07/17/2015 0615   CO2 21* 07/17/2015 0615   CO2 20* 07/03/2015 0936   GLUCOSE 122* 07/17/2015 0615   GLUCOSE 150* 07/03/2015 0936   BUN 98* 07/17/2015 0615   BUN 20.8 07/03/2015 0936   CREATININE 3.00* 07/17/2015 0615   CREATININE 2.7* 07/03/2015 0936   CALCIUM 9.6 07/17/2015 0615   CALCIUM 9.1 07/03/2015 0936   PROT 9.0* 07/09/2015 0400  PROT 8.9* 07/03/2015 0936   ALBUMIN 3.4* 07/17/2015 0615   ALBUMIN 3.3* 07/03/2015 0936   AST 21 07/09/2015 0400   AST 14 07/03/2015 0936   ALT 14* 07/09/2015 0400   ALT 10 07/03/2015 0936   ALKPHOS 36* 07/09/2015 0400   ALKPHOS 44 07/03/2015 0936   BILITOT 0.6 07/09/2015 0400   BILITOT 1.04 07/03/2015 0936   GFRNONAA 20* 07/17/2015 0615   GFRAA 23* 07/17/2015 0615       Problem List:  Patient Active Problem List   Diagnosis Date Noted  . DNR (do not resuscitate) discussion   . Bilateral pleural effusion   . Constipation   . Hyponatremia   . Fecal impaction ([place])   . Palliative care encounter   .  Counseling regarding advanced directives and goals of care   . Status post thoracentesis   . Acute respiratory failure with hypoxia ([place]) 07/08/2015  . 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 Acute renal failure superimposed on stage 3 chronic kidney disease ([place]) 07/08/2015  . Multiple myeloma ([place])   . Dyspnea 07/07/2015  . Pleural effusion 07/07/2015  . Metastatic cancer ([place]) 07/07/2015  . Lymphadenopathy, supraclavicular 07/03/2015  . Encounter for antineoplastic chemotherapy 05/12/2015  . UTI (urinary tract infection) due to Enterococcus and pseudomonas  03/24/2015  . Sepsis due to Pseudomonas UTI and bacteremia ([place]) 03/24/2015  . Enlarged prostate with urinary obstruction 03/24/2015  . Essential hypertension   . 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 CKD (chronic kidney disease) stage 5, GFR less than 15 ml/min ([facility]) 02/19/2015  . Antineoplastic chemotherapy induced anemia 02/19/2015  . Bilateral hydronephrosis 02/18/2015  . 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     Palliative Care Assessment & Plan    1.Code Status:  DNR/DNI-documemted today       Code Status Orders        Start     Ordered   07/07/15 1331  Full code   Continuous     07/07/15 1330    Code Status History    Date Active Date Inactive Code Status Order ID Comments User Context   03/20/2015  7:22 PM 03/25/2015  2:47 PM Full Code [id number]  Theodis Blaze, MD Inpatient   02/17/2015  4:52 PM 02/23/2015  4:19 PM Full Code [id number]  Theodis Blaze, MD Inpatient   10/22/2014  8:52 AM 10/22/2014  1:09 PM Full Code [id number]  Milly Jakob, MD Inpatient   10/02/2014  9:48 AM 10/03/2014  3:19 AM Full Code [id number]  Arne Cleveland, MD HOV   11/14/2013 10:03 AM 11/15/2013  3:30 AM Full Code [id number]  Markus Daft, MD HOV       2. Goals of Care/Additional Recommendations:    Limitations on Scope of Treatment:  -  patietn is wanting to stop radiation today  Desire for further Chaplaincy  support:no  Psycho-social Needs: Caregiving  Support/Resources, Hospice benefit  3. Symptom Management:       1. Pain: utilizing Tramadol with acceptable pain control.   Continued discussion regarding utilization of medication to maximize comfort and promote function, I anticipate patient's symptom load will increase  4. Palliative Prophylaxis:   Bowel Regimen  5. Prognosis: days to weeks  6. Discharge Planning:  pending hospital course- will clarify with patient and family in the am desire for hospice facility   Care plan was discussed with Dr Tat  Thank you for allowing the Palliative Medicine Team to assist in the care of this patient.   Time In:  1500 Time Out:  1535 Total Time  35 min Prolonged Time Billed  no        [address] [place], NP  07/17/2015, 4:55 PM  Please contact Palliative Medicine Team phone at [phone] for questions and concerns.

## 2015-07-18 ENCOUNTER — Telehealth: Payer: Self-pay | Admitting: Oncology

## 2015-07-18 ENCOUNTER — Ambulatory Visit: Payer: Medicare HMO

## 2015-07-18 ENCOUNTER — Telehealth: Payer: Self-pay | Admitting: Medical Oncology

## 2015-07-18 ENCOUNTER — Ambulatory Visit
Admit: 2015-07-18 | Discharge: 2015-07-18 | Disposition: A | Discharge status: Home / Self Care | Payer: Medicare HMO | Attending: Radiation Oncology | Admitting: Radiation Oncology

## 2015-07-18 DIAGNOSIS — G893 Neoplasm related pain (acute) (chronic): Secondary | ICD-10-CM | POA: Insufficient documentation

## 2015-07-18 DIAGNOSIS — Z66 Do not resuscitate: Secondary | ICD-10-CM | POA: Insufficient documentation

## 2015-07-18 LAB — BASIC METABOLIC PANEL
ANION GAP: 14 (ref 5–15)
BUN: 110 mg/dL — ABNORMAL HIGH (ref 6–20)
CHLORIDE: 95 mmol/L — AB (ref 101–111)
CO2: 22 mmol/L (ref 22–32)
CREATININE: 3.07 mg/dL — AB (ref 0.61–1.24)
Calcium: 9.7 mg/dL (ref 8.9–10.3)
GFR calc non Af Amer: 20 mL/min — ABNORMAL LOW (ref >60)
GFR, EST AFRICAN AMERICAN: 23 mL/min — AB (ref >60)
Glucose, Bld: 130 mg/dL — ABNORMAL HIGH (ref 65–99)
POTASSIUM: 5.2 mmol/L — AB (ref 3.5–5.1)
Sodium: 131 mmol/L — ABNORMAL LOW (ref 135–145)

## 2015-07-18 MED ORDER — HEPARIN SOD (PORK) LOCK FLUSH 100 UNIT/ML IV SOLN
500.0000 [IU] | Freq: Once | INTRAVENOUS | Status: AC
  Administered 2015-07-18: 500 [IU] via INTRAVENOUS
  Filled 2015-07-18: qty 5

## 2015-07-18 MED ORDER — TRAMADOL HCL 50 MG PO TABS: 50.0000 mg | ORAL_TABLET | Freq: Four times a day (QID) | ORAL | Status: AC | PRN

## 2015-07-18 MED ORDER — MORPHINE SULFATE (CONCENTRATE) 10 MG/0.5ML PO SOLN: 5.0000 mg | ORAL | Status: AC | PRN

## 2015-07-18 MED ORDER — DEXAMETHASONE 4 MG PO TABS: 4.0000 mg | ORAL_TABLET | Freq: Two times a day (BID) | ORAL | Status: AC

## 2015-07-18 MED ORDER — LORAZEPAM 1 MG PO TABS: 1.0000 mg | ORAL_TABLET | Freq: Four times a day (QID) | ORAL | Status: DC | PRN

## 2015-07-18 MED ORDER — LORAZEPAM 1 MG PO TABS: 1.0000 mg | ORAL_TABLET | Freq: Four times a day (QID) | ORAL | Status: AC | PRN

## 2015-07-18 NOTE — Progress Notes (Signed)
Assessment: 1 Hyponatremia, improving 2. 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 Multiple Myeloma, advanced  PLAN: 1. Liberalize PO fluid and keep off lasix.  I don't think we need to normalize sodium 2. Supportive care or comfort if MM cannot be aggressively treated or no desire 3.   We will sign off   Subjective: Interval History: none.  Objective: Vital signs in last 24 hours: Temp:  [97 F (36.1 C)-97.1 F (36.2 C)] 97 F (36.1 C) (03/16 0537) Pulse Rate:  [120-125] 125 (03/16 0537) Resp:  [17-18] 17 (03/16 0537) BP: (116-148)/(74-93) 148/93 mmHg (03/16 0537) SpO2:  [93 %-99 %] 93 % (03/16 0537) Weight change:   Intake/Output from previous day: 03/15 0701 - 03/16 0700 In: 1020 [P.O.:1020] Out: 500 [Urine:500] Intake/Output this shift:    Extremities: edema 1+  Lab Results:  Recent Labs  07/16/15 0540  WBC 6.6  HGB 8.3*  HCT 24.7*  PLT 157   BMET:  Recent Labs  07/17/15 0615 07/18/15 0520  NA 131* 131*  K 4.4 5.2*  CL 95* 95*  CO2 21* 22  GLUCOSE 122* 130*  BUN 98* 110*  CREATININE 3.00* 3.07*  CALCIUM 9.6 9.7   No results for input(s): PTH in the last 72 hours. Iron Studies: No results for input(s): IRON, TIBC, TRANSFERRIN, FERRITIN in the last 72 hours. Studies/Results: [place] Chest  07/16/2015  CLINICAL DATA:  Right pleural effusion prior to pleural catheter placement. EXAM: CHEST ULTRASOUND COMPARISON:  Chest x-ray July 15, 2015 FINDINGS: Bilateral pleural effusions are identified. IMPRESSION: Bilateral pleural effusions identified. Electronically Signed   By: Abelardo Diesel M.D.   On: 07/16/2015 12:01   Ir Fluoro Rm 30-60 Min  07/16/2015  CLINICAL DATA:  70 year old male with recurrent right-sided malignant pleural effusion. EXAM: IR FLOURO RM 0-60 MIN MEDICATIONS: None CONTRAST:  None FLUOROSCOPY TIME:  None PROCEDURE: The right chest was interrogated with ultrasound. There has been reaccumulation of pleural  fluid, unfortunately the fluid is only small to moderate in volume. The hypoechoic soft tissue masses replacing the lateral aspect of several right-sided ribs are directly in the way of the needle path access the pleural fluid. There is insufficient fluid for safe PleurX catheter placement at this time. COMPLICATIONS: None FINDINGS: The right chest was interrogated with ultrasound. There has been reaccumulation of pleural fluid, unfortunately the fluid is only small to moderate in volume. The hypoechoic soft tissue masses replacing the lateral aspect of several right-sided ribs are directly in the way of the needle path access the pleural fluid. There is insufficient fluid for safe PleurX catheter placement at this time. IMPRESSION: Unfortunately, despite ultrasound images from earlier today demonstrating reaccumulation of pleural fluid, the pleural fluid is insufficient to allow for PleurX catheter placement at this time largely due to the presence of soft tissue masses in the lateral aspect of the right seventh rib. There needs to be sufficient pleural fluid to allow pleural access above or be low at this level. Recommend discharging patient home and scheduling for outpatient PleurX catheter placement early next week. This should allow sufficient time for fluid reaccumulation. Signed, Criselda Peaches, MD [facility] Electronically Signed   By: Jacqulynn Cadet M.D.   On: 07/16/2015 17:15    Scheduled: . allopurinol  100 mg Oral Daily  . amLODipine  5 mg Oral Daily  . bisacodyl  10 mg Rectal Once  . dexamethasone  4 mg Oral 4 times  per day  . feeding supplement (PRO-STAT SUGAR FREE 64)  30 mL Oral TID WC  . finasteride  5 mg Oral Daily  . hydrALAZINE  50 mg Oral TID  . polyethylene glycol  17 g Oral BID  . senna  2 tablet Oral BID  . sodium chloride flush  10-40 mL Intracatheter Q12H  . sodium chloride flush  3 mL Intravenous Q12H  .  tamsulosin  0.4 mg Oral BID     LOS: 11 days   Oddie Bottger C 07/18/2015,8:50 AM

## 2015-07-18 NOTE — Discharge Summary (Signed)
Physician Discharge Summary  Thayer Embleton GUY:[id number] DOB: 1945/09/13 DOA: 07/07/2015  PCP: [name], MD  Admit date: 07/07/2015 Discharge date: 07/18/2015  Discharge home with home hospice Discharge Diagnoses:  Acute respiratory failure with hypoxia - Secondary to malignant pleural effusion related to myeloma progression - Status post right thoracentesis of 1.6 L by CCM. 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 He and his wife have discussed with me and pulmonologist and at this time he is agreeable to proceed with Pleurx. IR consulted for same. However at this time patient may not have enough pleural fluid for Pleurx catheter placement. Unless he has significant reaccumulation prior to discharge, he may have to follow up as an outpatient with weekly chest x-rays with pulmonology and consider Pleurx catheter at that time. Discussed with Dr. [name], pulmonology on 3/9. Chest x-ray shows worsening pleural effusions. - Doubt pneumonia and hence agree with discontinued IV Rocephin and azithromycin on 3/8. - Discussed with Dr. [name], oncologist on 3/9. He indicated that patient has aggressive form of multiple myeloma and recommends palliative care consulted for goals of care. He will follow-up in the office regarding further chemotherapy and referral to [facility]. 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 - As per IR on 3/10, not enough pleural fluid to place Pleurx catheter. Outpatient follow-up with  pulmonology. - Intermittent dyspnea. Chest x-ray shows minimal increase in pleural fluid but not substantial. Dyspnea may be multifactorial related to mediastinal mass and small pleural effusion.  - IR evaluating today to see if Pleurx can be placed.  Progressive multiple myeloma - CT chest, abdomen and pelvis show several masses concerning for myeloma progression. - Oncology input 3/7 appreciated: Continue steroids, radiation oncology consulted for palliative radiation to address enlarging masses and oncology will consider transferring to [facility] as outpatient. - 2-D echo: EF 65-70 percent, grade 1 diastolic dysfunction, no wall motion abnormality and trivial pericardial effusion (CT had suggested mass effect on pericardium) - Discussed with Dr. [name], oncologist on 3/9. He indicated that patient has aggressive form of multiple myeloma and recommends palliative care consulted for goals of care. He will follow-up in the office regarding further chemotherapy and referral to [place] care input appreciated: After further family meetings on 07/17/2015, the patient and his family changed his focus of care to that of comfort.  Hyponatremia - Likely multifactorial - Serum osmolarity: 268 and urine osmolarity: 216. Repeat serum osmolarity: 302 and urine osmolarity 416. - Unclear etiology-? Some relationship to hyperuricemia. Asymptomatic of CNS symptoms. - Recurrent hyponatremia. Sodium 123 on 3/10.? Related to chronic kidney disease. Discussed with nephrologist on call on 3/10: He indicated that it would be difficult to interpret urine and serum osmolarity in the context of chronic kidney disease. He felt that patient was overall volume depleted due to high-dose Lasix patient received early on in admission along with mild sinus tachycardia. He recommended IV normal saline hydration and monitoring BMP. - Continues to have recurrent hyponatremia. Given  this and recurrent hyperkalemia, nephrology was consulted for assistance with evaluation and management. - Nephrology consultation 3/13 appreciated:  Hyponatremia may be multifactorial-volume overload, pseudohyponatremia related to multiple myeloma. Discontinued IV fluids, fluid restrict and started oral Lasix.TSH normal. -4.799 L since admission. Sodium has improved from 126 greater than 129 but have to monitor trends due to frequent fluctuation since hospitalization. -Serum sodium has improved to 131 -Cased discussed with nephrology, Dr. Thora Lance lasix; liberalize fluid intake  Hyperkalemia, mild, recurrent - Potassium 5.3 on 3/9. May be secondary to worsening of acute kidney injury. DC Lasix. Provided a dose of Kayexalate. Change diet to renal diet. - Recurrent mild hyperkalemia. Potassium again 5.3 on 3/13. - Nephrology consulted for assistance-as above. Hyperkalemia for now has resolved.  Hyperuricemia - Uric acid is 12. Started allopurinol on 3/8 .  Acute on stage III chronic kidney disease/chronically obstructed kidney - Baseline creatinine 2.1-2.5 - Now may have worsened due to diuretics. Marland Kitchen  - However creatinine on 3/9 has increased to 3. Lasix was temporarily held and patient was hydrated with IV fluids. Creatinine had improved to 2.36. -No longer active issue as the patient's focus of care has been changed to that with focus of comfort  Essential hypertension - Mildly Uncontrolled. - No longer active issue as the patient's focus of care has been changed to that with focus of comfort  Right hydroureteronephrosis - Some improvement on CT. 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  Constipation/fecal impaction - Started bowel regimen.  - Improved and may have resolved.  Anemia of chronic disease/malignancy and chemotherapy - Stable.  Severe malnutrition -Nutritional supplements -Liberalize diet   Consultants:  Pulmonary and critical care  medicine  Medical oncology  Radiation oncology  Palliative care medicine   Nephrology.  Procedures:  Right thoracentesis on 07/07/15  2-D echo 07/07/15: Study Conclusions  - Left ventricle: The cavity size was normal. Systolic function was  vigorous. The estimated ejection fraction was in the range of 65%  to 70%. Wall motion was normal; there were no regional wall  motion abnormalities. Doppler parameters are consistent with  abnormal left ventricular relaxation (grade 1 diastolic  dysfunction). - Left atrium: The atrium was mildly dilated. - Pericardium, extracardiac: A trivial pericardial effusion was  identified. Features were not consistent with tamponade  physiology. There was a moderate-sized left pleural effusion.  Antimicrobials:  Azithromycin 3/5 > 3/8  IV Rocephin 3/5 > 3/8  Discharge Condition: stable  Disposition: home with hospice care  Diet:regular Wt Readings from Last 3 Encounters:  07/17/15 84.278 kg (185 lb 12.8 oz)  07/03/15 85.911 kg (189 lb 6.4 oz)  06/27/15 87.091 kg (192 lb)    History of present illness:  70 year old male with a history of aggressive multiple myeloma & several plasmacytoma masses in the chest and abdomen, followed by Dr. [name] who presently is scheduled to start chemotherapy (3rd line therapy due to disease progression) with Carfilzomib, Pomalyst and Decadron presented with progressive dyspnea over the past 1-2 days PTA. Workup in the emergency department showed bilateral pleural effusions on chest x-ray. CT of the chest, abdomen and pelvis on 07/07/2015 revealed a moderate left and large right pleural effusion and several masses in the chest and abdomen concerning for multiple myeloma progression. Right therapeutic and diagnostic thoracentesis on 3/5 with resolution of hypoxia and improvement of symptoms. Concern for reaccumulation of malignant pleural effusion. Pulmonary consulted IR but not much pleural fluid to place  Pleurx safely at this time. Outpatient follow-up with pulmonology to determine timing of Pleurx placement. Radiation oncology started radiation. Palliative care consulted for goals of care. Patient  transferred to medical floor on 3/8. Ongoing issues with electrolyte abnormalities (recurrent hyponatremia and hyperkalemia)-nephrology consulted 3/13, fecal impaction-improved/resolved, pleural effusion monitoring-IR considering Pleurx catheter placement , but there was not a safe portal for Pleurx placement. Further family meetings were undertaken. Ultimately, the patient and his family wanted to change his focus of care to that on comfort. As a result, his nonessential medications were discontinued. Patient will be discharged home with hospice care and ultimately transitioned to a residential hospice thereafter.     Discharge Exam: Filed Vitals:   07/17/15 2051 07/18/15 0537  BP: 116/75 148/93  Pulse: 120 125  Temp: 97 F (36.1 C) 97 F (36.1 C)  Resp: 18 17   Filed Vitals:   07/17/15 1100 07/17/15 1454 07/17/15 2051 07/18/15 0537  BP: 132/74 140/86 116/75 148/93  Pulse:  120 120 125  Temp:  97.1 F (36.2 C) 97 F (36.1 C) 97 F (36.1 C)  TempSrc:  Oral Oral Oral  Resp:  '18 18 17  '$ Height:      Weight:      SpO2:  97% 99% 93%   General: A&O x 3, NAD, pleasant, cooperative Cardiovascular: RRR, no rub, no gallop, no S3 Respiratory: Diminished breath sounds at the bases. Abdomen:soft, nontender, nondistended, positive bowel sounds Extremities: No edema, No lymphangitis, no petechiae  Discharge Instructions      Discharge Instructions    Diet general    Complete by:  As directed      Increase activity slowly    Complete by:  As directed             Medication List    STOP taking these medications        finasteride 5 MG tablet  Commonly known as:  PROSCAR     hydrALAZINE 50 MG tablet  Commonly known as:  APRESOLINE     HYDROcodone-acetaminophen 5-325 MG tablet    Commonly known as:  NORCO/VICODIN     pomalidomide 3 MG capsule  Commonly known as:  POMALYST     PRESCRIPTION MEDICATION     tamsulosin 0.4 MG Caps capsule  Commonly known as:  FLOMAX      TAKE these medications        acetaminophen 325 MG tablet  Commonly known as:  TYLENOL  Take 2 tablets (650 mg total) by mouth every 4 (four) hours as needed for mild pain, fever or headache.     amLODipine 5 MG tablet  Commonly known as:  NORVASC  Take 1 tablet (5 mg total) by mouth daily.     dexamethasone 4 MG tablet  Commonly known as:  DECADRON  Take 1 tablet (4 mg total) by mouth 2 (two) times daily.     lactose free nutrition Liqd  Take 237 mLs by mouth 2 (two) times daily.     lidocaine-prilocaine cream  Commonly known as:  EMLA  Apply 1 application topically as needed.     LORazepam 1 MG tablet  Commonly known as:  ATIVAN  Take 1 tablet (1 mg total) by mouth every 6 (six) hours as needed for anxiety.     morphine CONCENTRATE 10 MG/0.5ML Soln concentrated solution  Take 0.25 mLs (5 mg total) by mouth every 2 (two) hours as needed for moderate pain, severe pain or shortness of breath.     prochlorperazine 10 MG tablet  Commonly known as:  COMPAZINE  Take 1 tablet (10 mg total) by mouth every 6 (six) hours as needed for nausea  or vomiting.     traMADol 50 MG tablet  Commonly known as:  ULTRAM  Take 1 tablet (50 mg total) by mouth every 6 (six) hours as needed for moderate pain.         The results of significant diagnostics from this hospitalization (including imaging, microbiology, ancillary and laboratory) are listed below for reference.    Significant Diagnostic Studies: Ct Abdomen Pelvis Wo Contrast  07/07/2015  CLINICAL DATA:  Shortness of breath for 2 weeks, also right groin pain with known hernia in that region, personal history of multiple myeloma and bladder cancer EXAM: CT CHEST, ABDOMEN AND PELVIS WITHOUT CONTRAST TECHNIQUE: Multidetector CT imaging of the  chest, abdomen and pelvis was performed following the standard protocol without IV contrast. COMPARISON:  Numerous prior CT scans and radiographs FINDINGS: CT CHEST FINDINGS Mediastinum/Lymph Nodes: On this non contrasted study there is bulky nodularity in the retrosternal region of the superior mediastinum. This likely represents a combination of prominent venous structures +adenopathy. The heart is mildly enlarged, and there is mass effect on the heart anteriorly from bulky multinodular soft tissue density interposed between the anterior chest wall in the anterior pericardium. This extends from left to right measuring 17 cm. This process demonstrates a thickness of 3.4 cm. Lungs/Pleura: There is a small to moderate left pleural effusion and there is a large right pleural effusion. Minimal aerated right lung is involved with atelectasis. Aerated left lung is also involved with mild compressive atelectasis. The lungs are otherwise clear allowing for the presence of respiratory motion. Musculoskeletal: There are bilateral paramedian anterior chest wall masses just inferior to the sternomanubrial joint. These were not present previously. These measure about 2.5 cm in AP diameter maximally. This process continues inferiorly to blend with the soft tissue mass anterior to the heart described above. Mass along the right lateral seventh rib again identified, smaller than on the prior study, today measuring approximately 5.7 x 3.1 cm as compared to 6.5 x 3.8 cm previously. CT ABDOMEN PELVIS FINDINGS Hepatobiliary: No focal abnormalities given limited non contrasted evaluation. Gallbladder is negative. Pancreas: Pancreatic tail is draped anteriorly around an enlarged mass arising anteriorly off of the left kidney. On 03/20/2015 this mass measured 46 x 50 mm. It is markedly enlarged today measuring 78 x 104 mm. Spleen: No significant abnormalities Adrenals/Urinary Tract: Adrenal glands negative. Right kidney demonstrates mild  hydronephrosis. Right hydronephrosis and hydroureter slightly less prominent when compared to 03/20/2015. There is severe diffuse bladder wall thickening, with a lobulated bladder mass again identified similar to prior studies. Stomach/Bowel: Nonobstructive gas pattern. New no focal bowel abnormalities. Vascular/Lymphatic: No evidence of aortic dilatation. There is adenopathy inferior to the aortic bifurcation measuring about 2.7 cm in short axis. There is extensive adenopathy in the bilateral inguinal regions right greater than left. Largest right inguinal lymph node measures about 3.8 cm in short axis. Soft tissue material within the right inguinal canal appears consistent with adenopathy within inguinal hernia. No definite bowel involvement. There is peritoneal nodularity in the pericolic gutter on the left measuring 43 by 25 mm on image number 59. This was not present previously. Mesenteric adenopathy left lower quadrant also new or increased with the largest lymph node measuring 15 mm. Along the right lateral margin of the liver there is a lentiform 5 x 2.5 cm mass indenting liver contour suggesting capsular implant. Reproductive: Bilateral inguinal hernias containing fat. Additionally, as described above, soft tissue nodules are seen within the right inguinal hernia suggesting adenopathy. Other:  There is trace ascites present in the abdomen and pelvis Musculoskeletal: No evidence of vertebra plana. No acute musculoskeletal findings. IMPRESSION: 1. Small to moderate left and large right pleural effusion. Proportional bilateral volume loss as a result. These are new from 03/20/2015. 2. Mass effect on the anterior heart border may be hemodynamically significant. The jugular veins appear more prominent than they did previously. 3. Bulky multinodular soft tissue mass interposed between the anterior chest wall and anterior pericardium causing mass effect, likely reflecting extensive mediastinal adenopathy. There is  similar fullness in the superior mediastinum which likely reflects a combination of venous engorgement and adenopathy. 4. Right chest wall mass persists but is smaller when compared to prior study. 5. Market enlargement of left periaortic mass inseparable from left kidney. This causes significant mass effect upon the left kidney as well as on the tail of the pancreas and stomach. 6. Evidence of new mesenteric adenopathy and peritoneal implants as well as inguinal adenopathy. 7. Severe diffuse bladder wall thickening. Lobulated inferior posterior bladder mass again identified. 8. Moderate right hydroureteronephrosis slightly less pronounced when compared to the prior study. Electronically Signed   By: Skipper Cliche M.D.   On: 07/07/2015 10:33   Dg Chest 1 View  07/08/2015  CLINICAL DATA:  Post right thoracentesis. History of right pulmonary nodule, bladder tumor, multiple myeloma. EXAM: CHEST 1 VIEW COMPARISON:  07/07/2015 FINDINGS: Shallow inspiration. Bilateral pleural effusions again demonstrated. Left pleural effusion appears increased since previous study, possibly due to shallow inspiration. Mild cardiac enlargement. Atelectasis in the lung bases. No pneumothorax. Power port type central venous catheter with tip over the low SVC region. IMPRESSION: Bilateral pleural effusions and basilar atelectasis, possibly increasing on the left. No pneumothorax. Electronically Signed   By: Lucienne Capers M.D.   On: 07/08/2015 00:32   Dg Chest 2 View  07/15/2015  CLINICAL DATA:  Known multiple myeloma. Shortness of breath and pleural effusions EXAM: CHEST  2 VIEW COMPARISON:  July 14, 2015 chest radiograph ; chest CT July 07, 2015 FINDINGS: Fairly small pleural effusions bilaterally remain stable. There is atelectatic change in the bases. No frank edema or consolidation. Heart is upper normal in size with pulmonary vascularity within normal limits. The anterior mediastinal mass is seen on recent CT are less well  seen radiographically. There is soft tissue fullness in the anterior mediastinum, stable radiographically. There are surgical clips anteriorly on the left. There is a destructive lytic bone lesion involving much of the right anterior and lateral seventh rib. Associated soft tissue mass present in this area as well. IMPRESSION: Persistent small pleural effusions. Mild bibasilar atelectasis. Anterior mediastinal masses, better seen by CT. Destructive lesion right seventh rib with soft tissue mass. Electronically Signed   By: Lowella Grip III M.D.   On: 07/15/2015 10:12   Dg Chest 2 View  07/14/2015  CLINICAL DATA:  Bilateral pleural effusions. Personal history of right-sided pulmonary nodule cup multiple myeloma, bladder cancer. EXAM: CHEST - 2 VIEW COMPARISON:  Two-view chest x-ray 07/12/2015. CT of the chest 07/07/2015. FINDINGS: The heart size is exaggerated by low lung volumes. Bilateral pleural effusions are slightly increased. Anterior mediastinal mass is again noted. There is no significant edema. Bibasilar airspace disease likely reflects atelectasis. Surgical clips are again seen in the left axilla. IMPRESSION: 1. Slight increase in bilateral pleural effusions and associated atelectasis. 2. Anterior mediastinal mass. Electronically Signed   By: San Morelle M.D.   On: 07/14/2015 10:17   Dg Chest 2 View  07/12/2015  CLINICAL DATA:  History of myeloma and bladder cancer. Evaluate bilateral pleural effusions EXAM: CHEST  2 VIEW COMPARISON:  07/10/2015; 07/07/2015; chest CT - 07/07/2015 FINDINGS: Grossly unchanged cardiac silhouette and mediastinal contours with persistent thickening of the bilateral paratracheal stripe secondary to known mediastinal lymphadenopathy. Small right and trace left-sided pleural effusions are unchanged. Unchanged bibasilar heterogeneous/consolidative opacities. No new focal airspace opacities. No definite evidence of edema. Surgical clips overlie the left axilla. No  acute osseus abnormalities. IMPRESSION: 1. Grossly unchanged small right and trace left-sided pleural effusions with associated bibasilar opacities, likely atelectasis. 2. Similar findings compatible with known mediastinal adenopathy. Electronically Signed   By: Sandi Mariscal M.D.   On: 07/12/2015 10:27   Dg Chest 2 View  07/10/2015  CLINICAL DATA:  Pleural effusion. EXAM: CHEST  2 VIEW COMPARISON:  July 09, 2015. FINDINGS: Stable cardiomediastinal silhouette. No pneumothorax is noted. Right internal jugular Port-A-Cath is unchanged with distal tip in expected position of the SVC. Mild bilateral pleural effusions are noted which are increased compared to prior exam. Bibasilar atelectasis or infiltrates are noted. Lytic destruction of lateral portion of right seventh rib is again noted. IMPRESSION: Mild bilateral pleural effusions are noted which are increased compared to prior exam. Bibasilar atelectasis or infiltrates are noted. Electronically Signed   By: Marijo Conception, M.D.   On: 07/10/2015 15:34   Dg Chest 2 View  07/07/2015  CLINICAL DATA:  Shortness of breath for 2 weeks with some anterior left chest pain, history of multiple myeloma and bladder cancer EXAM: CHEST  2 VIEW COMPARISON:  06/26/2015 FINDINGS: Mildly enlarged cardiac silhouette. Elevated right diaphragm. Bibasilar opacities suggesting a combination of airspace disease and pleural effusion. Stable right Port-A-Cath. IMPRESSION: Bilateral lower lobe opacity right worse than left suggesting a combination of pleural effusion and consolidation. Electronically Signed   By: Skipper Cliche M.D.   On: 07/07/2015 09:05   Dg Chest 2 View  06/26/2015  CLINICAL DATA:  Shortness of breath and cough.  Sharp chest pain. EXAM: CHEST  2 VIEW COMPARISON:  03/20/2015 and CT scan of the chest dated 11/23/2014 FINDINGS: There are new small bilateral pleural effusions. The nodule at the left lung base posteriorly has increased in size and measures 13 mm in  diameter. This was approximately 9 mm in size on the prior CT scan. There is suggestion of a new 13 mm nodule at the right lung base laterally adjacent to the mass destroying the lateral aspect of the right seventh rib. That mass appears essentially unchanged. No new bone lesions. There is slight cardiomegaly. Pulmonary vascularity is normal. Power port appears in good position. IMPRESSION: 1. New small bilateral pleural effusions. 2. Slight increase in size of the pulmonary nodule at the left lung base posteriorly. 3. Possible new nodule at the right lung base laterally. 4. No significant change in the mass destroying the lateral aspect of the right seventh rib. Electronically Signed   By: Lorriane Shire M.D.   On: 06/26/2015 12:51   Dg Abd 1 View  07/14/2015  CLINICAL DATA:  Fecal impaction.  Pleural effusion. EXAM: ABDOMEN - 1 VIEW COMPARISON:  One-view abdomen 07/12/2015. FINDINGS: Moderate stool is noted throughout the colon, slightly decreased from the prior exam. There is no obstruction. The bowel gas pattern is otherwise normal. Degenerative changes are again noted in the lower lumbar spine. IMPRESSION: 1. Improving moderate stool burden. 2. No associated obstruction. Electronically Signed   By: San Morelle M.D.   On:  07/14/2015 10:11   Ct Chest Wo Contrast  07/07/2015  CLINICAL DATA:  Shortness of breath for 2 weeks, also right groin pain with known hernia in that region, personal history of multiple myeloma and bladder cancer EXAM: CT CHEST, ABDOMEN AND PELVIS WITHOUT CONTRAST TECHNIQUE: Multidetector CT imaging of the chest, abdomen and pelvis was performed following the standard protocol without IV contrast. COMPARISON:  Numerous prior CT scans and radiographs FINDINGS: CT CHEST FINDINGS Mediastinum/Lymph Nodes: On this non contrasted study there is bulky nodularity in the retrosternal region of the superior mediastinum. This likely represents a combination of prominent venous structures  +adenopathy. The heart is mildly enlarged, and there is mass effect on the heart anteriorly from bulky multinodular soft tissue density interposed between the anterior chest wall in the anterior pericardium. This extends from left to right measuring 17 cm. This process demonstrates a thickness of 3.4 cm. Lungs/Pleura: There is a small to moderate left pleural effusion and there is a large right pleural effusion. Minimal aerated right lung is involved with atelectasis. Aerated left lung is also involved with mild compressive atelectasis. The lungs are otherwise clear allowing for the presence of respiratory motion. Musculoskeletal: There are bilateral paramedian anterior chest wall masses just inferior to the sternomanubrial joint. These were not present previously. These measure about 2.5 cm in AP diameter maximally. This process continues inferiorly to blend with the soft tissue mass anterior to the heart described above. Mass along the right lateral seventh rib again identified, smaller than on the prior study, today measuring approximately 5.7 x 3.1 cm as compared to 6.5 x 3.8 cm previously. CT ABDOMEN PELVIS FINDINGS Hepatobiliary: No focal abnormalities given limited non contrasted evaluation. Gallbladder is negative. Pancreas: Pancreatic tail is draped anteriorly around an enlarged mass arising anteriorly off of the left kidney. On 03/20/2015 this mass measured 46 x 50 mm. It is markedly enlarged today measuring 78 x 104 mm. Spleen: No significant abnormalities Adrenals/Urinary Tract: Adrenal glands negative. Right kidney demonstrates mild hydronephrosis. Right hydronephrosis and hydroureter slightly less prominent when compared to 03/20/2015. There is severe diffuse bladder wall thickening, with a lobulated bladder mass again identified similar to prior studies. Stomach/Bowel: Nonobstructive gas pattern. New no focal bowel abnormalities. Vascular/Lymphatic: No evidence of aortic dilatation. There is  adenopathy inferior to the aortic bifurcation measuring about 2.7 cm in short axis. There is extensive adenopathy in the bilateral inguinal regions right greater than left. Largest right inguinal lymph node measures about 3.8 cm in short axis. Soft tissue material within the right inguinal canal appears consistent with adenopathy within inguinal hernia. No definite bowel involvement. There is peritoneal nodularity in the pericolic gutter on the left measuring 43 by 25 mm on image number 59. This was not present previously. Mesenteric adenopathy left lower quadrant also new or increased with the largest lymph node measuring 15 mm. Along the right lateral margin of the liver there is a lentiform 5 x 2.5 cm mass indenting liver contour suggesting capsular implant. Reproductive: Bilateral inguinal hernias containing fat. Additionally, as described above, soft tissue nodules are seen within the right inguinal hernia suggesting adenopathy. Other: There is trace ascites present in the abdomen and pelvis Musculoskeletal: No evidence of vertebra plana. No acute musculoskeletal findings. IMPRESSION: 1. Small to moderate left and large right pleural effusion. Proportional bilateral volume loss as a result. These are new from 03/20/2015. 2. Mass effect on the anterior heart border may be hemodynamically significant. The jugular veins appear more prominent than they did  previously. 3. Bulky multinodular soft tissue mass interposed between the anterior chest wall and anterior pericardium causing mass effect, likely reflecting extensive mediastinal adenopathy. There is similar fullness in the superior mediastinum which likely reflects a combination of venous engorgement and adenopathy. 4. Right chest wall mass persists but is smaller when compared to prior study. 5. Market enlargement of left periaortic mass inseparable from left kidney. This causes significant mass effect upon the left kidney as well as on the tail of the  pancreas and stomach. 6. Evidence of new mesenteric adenopathy and peritoneal implants as well as inguinal adenopathy. 7. Severe diffuse bladder wall thickening. Lobulated inferior posterior bladder mass again identified. 8. Moderate right hydroureteronephrosis slightly less pronounced when compared to the prior study. Electronically Signed   By: Skipper Cliche M.D.   On: 07/07/2015 10:33   [place] Chest  07/16/2015  CLINICAL DATA:  Right pleural effusion prior to pleural catheter placement. EXAM: CHEST ULTRASOUND COMPARISON:  Chest x-ray July 15, 2015 FINDINGS: Bilateral pleural effusions are identified. IMPRESSION: Bilateral pleural effusions identified. Electronically Signed   By: Abelardo Diesel M.D.   On: 07/16/2015 12:01   Nm Pulmonary Perf And Vent  06/26/2015  CLINICAL DATA:  Sudden onset chest pain this afternoon with shortness of breath. EXAM: NUCLEAR MEDICINE VENTILATION - PERFUSION LUNG SCAN TECHNIQUE: Ventilation images were obtained in multiple projections using inhaled aerosol Tc-69mDTPA. Perfusion images were obtained in multiple projections after intravenous injection of Tc-964mAA. RADIOPHARMACEUTICALS:  33.3 Technetium-9922mPA aerosol inhalation and 4.3 Technetium-80m44m IV COMPARISON:  Chest x-ray from today. FINDINGS: Ventilation: No focal ventilation defect. Perfusion: No wedge shaped peripheral perfusion defects to suggest acute pulmonary embolism. IMPRESSION: Normal bilateral lung perfusion. Electronically Signed   By: EricMisty Stanley.   On: 06/26/2015 19:57   Ir Fluoro Rm 30-60 Min  07/16/2015  CLINICAL DATA:  69 y6r old male with recurrent right-sided malignant pleural effusion. EXAM: IR FLOURO RM 0-60 MIN MEDICATIONS: None CONTRAST:  None FLUOROSCOPY TIME:  None PROCEDURE: The right chest was interrogated with ultrasound. There has been reaccumulation of pleural fluid, unfortunately the fluid is only small to moderate in volume. The hypoechoic soft tissue masses replacing the  lateral aspect of several right-sided ribs are directly in the way of the needle path access the pleural fluid. There is insufficient fluid for safe PleurX catheter placement at this time. COMPLICATIONS: None FINDINGS: The right chest was interrogated with ultrasound. There has been reaccumulation of pleural fluid, unfortunately the fluid is only small to moderate in volume. The hypoechoic soft tissue masses replacing the lateral aspect of several right-sided ribs are directly in the way of the needle path access the pleural fluid. There is insufficient fluid for safe PleurX catheter placement at this time. IMPRESSION: Unfortunately, despite ultrasound images from earlier today demonstrating reaccumulation of pleural fluid, the pleural fluid is insufficient to allow for PleurX catheter placement at this time largely due to the presence of soft tissue masses in the lateral aspect of the right seventh rib. There needs to be sufficient pleural fluid to allow pleural access above or be low at this level. Recommend discharging patient home and scheduling for outpatient PleurX catheter placement early next week. This should allow sufficient time for fluid reaccumulation. Signed, [facility] Electronically Signed   By: HeatJacqulynn Cadet.   On: 07/16/2015 17:15   Ir Fluoro Rm 30-60 Min  07/12/2015  INDICATION: History of multiple myeloma, post right-sided  thoracentesis on 07/07/2014 which yielded approximately 1.6 L of serosanguineous fluid. Cytologic analysis confirmed the presence of malignant cells. As such, request made for placement of an ultrasound and fluoroscopic guided right-sided pleural its catheter for palliative purposes. EXAM: IR FLOURO RM 0-60 MIN COMPARISON:  Chest radiograph - earlier same day; 07/10/2015; 07/09/2015; 07/07/2015; chest CT - 07/07/2015 MEDICATIONS: None. CONTRAST:  None FLUOROSCOPY TIME:  None  COMPLICATIONS: None immediate TECHNIQUE: Patient was positioned supine on the fluoroscopy table and preprocedural ultrasound scanning was performed of the right lateral chest. Images were reviewed and the decision was made not to proceed with either ultrasound-guided thoracentesis or tunneled pleural drainage catheter placement FINDINGS: Preprocedural ultrasound scanning of the right chest demonstrated only a trace/small amount of right-sided pleural fluid, not enough to warrant either ultrasound-guided thoracentesis or tunneled pleural drainage catheter at the present time. Additionally, upon questioning of the patient, he denies shortness of breath or chest discomfort. IMPRESSION: Trace/small amount of right-sided pleural fluid, not enough to warrant either ultrasound-guided thoracentesis for tunneled pleural drainage catheter placement. PLAN: Pending the rapidity of pleural fluid reaccumulation, would consider proceeding with therapeutic ultrasound guided thoracentesis prior to subjecting the patient to placement of a palliative tunneled pleural catheter. Electronically Signed   By: Sandi Mariscal M.D.   On: 07/12/2015 12:38   Dg Chest Port 1 View  07/09/2015  CLINICAL DATA:  Follow-up pleural effusion, respiratory failure, metastatic malignancy, acute and chronic renal failure. EXAM: PORTABLE CHEST 1 VIEW COMPARISON:  Portable chest x-ray of July 07, 2015 FINDINGS: The small pleural effusion on the right is again demonstrated. Inflation of the right lung is improved. There is soft tissue density noted along the lateral thoracic wall with loss of the contour of the right seventh rib. The left lung is well-expanded. Increased density at the left lung base persists. There is a small left pleural effusion. The cardiac silhouette remains enlarged. The pulmonary vascularity is not engorged. The Port-A-Cath appliance tip projects over the midportion of the SVC. There are surgical clips overlying the left lateral chest  wall. IMPRESSION: Stable appearance of small bilateral pleural effusions. Left-sided basilar atelectasis is somewhat less conspicuous. Lysis of the lateral aspect of the right seventh rib is better demonstrated today. Electronically Signed   By: Moria Brophy  [place] M.D.   On: 07/09/2015 07:05   Dg Abd Portable 1v  07/12/2015  CLINICAL DATA:  Myelopathy on a.  Abdominal pain and constipation. EXAM: PORTABLE ABDOMEN - 1 VIEW COMPARISON:  CT 07/07/2015 FINDINGS: There is large amount of gas and fecal matter throughout the colon. Moderate amount of small bowel gas. No free air seen. No acute bone finding. IMPRESSION: Large amount of fecal matter and gas within the colon consistent with the clinical suspicion of constipation. No free air seen. Electronically Signed   By: Nelson Chimes M.D.   On: 07/12/2015 16:02     Microbiology: No results found for this or any previous visit (from the past 240 hour(s)).   Labs: Basic Metabolic Panel:  Recent Labs Lab 07/14/15 0525 07/15/15 0505 07/16/15 0540 07/17/15 0615 07/18/15 0520  NA 128* 126* 129* 131* 131*  K 4.9 5.3* 4.6 4.4 5.2*  CL 95* 96* 95* 95* 95*  CO2 21* 21* 21* 21* 22  GLUCOSE 131* 128* 130* 122* 130*  BUN 76* 73* 80* 98* 110*  CREATININE 2.52* 2.36* 2.49* 3.00* 3.07*  CALCIUM 9.5 9.5 9.5 9.6 9.7  PHOS  --   --  5.0* 5.4*  --  Liver Function Tests:  Recent Labs Lab 07/16/15 0540 07/17/15 0615  ALBUMIN 3.5 3.4*   No results for input(s): LIPASE, AMYLASE in the last 168 hours. No results for input(s): AMMONIA in the last 168 hours. CBC:  Recent Labs Lab 07/12/15 0540 07/16/15 0540  WBC 7.0 6.6  HGB 8.6* 8.3*  HCT 25.0* 24.7*  MCV 90.3 96.1  PLT 176 157   Cardiac Enzymes: No results for input(s): CKTOTAL, CKMB, CKMBINDEX, TROPONINI in the last 168 hours. BNP: Invalid input(s): POCBNP CBG: No results for input(s): GLUCAP in the last 168 hours.  Time coordinating discharge:  Greater than 30 minutes  Signed:  Izza Bickle,  Jona Erkkila, DO [facility] Pager: [phone] 07/18/2015, 12:39 PM

## 2015-07-18 NOTE — Telephone Encounter (Signed)
Called Wesley [name].  He said that he does not want to continue his radiation treatments.  He is going home with hospice today.  Notified Jennifer, RT on [place] [address].

## 2015-07-18 NOTE — Care Management Note (Signed)
Case Management Note  Patient Details  Name: Wesley [name] MRN: [id number] Date of Birth: 07-05-1945  Subjective/Objective:    70 yo admitted with Dyspnea                Action/Plan: From home with spouse.  Expected Discharge Date:   (unknown)               Expected Discharge Plan:  [place]  In-House Referral:  NA  Discharge planning Services  CM Consult  Post Acute Care Choice:  Hospice Choice offered to:  Patient, Spouse  DME Arranged:    DME Agency:     HH Arranged:  Disease Management [place] Agency:  [facility]  Status of Service:  In process, will continue to follow  Medicare Important Message Given:  Yes Date Medicare IM Given:    Medicare IM give by:    Date Additional Medicare IM Given:    Additional Medicare Important Message give by:     If discussed at [place] of Stay Meetings, dates discussed:    Additional Comments: CM consult for home hospice referral.  This CM met with pt and wife at bedside to discuss dc plan.  Pt and wife confirm that they would like to go home with home hospice services.  They have chosen [facility] when offered choice.  [facility] rep alerted of referral and will assist with ordering DME for pt.  CM will continue to follow and assist as needed. Lynnell Catalan, RN 07/18/2015, 10:28 AM

## 2015-07-18 NOTE — Progress Notes (Signed)
Daily Progress Note   Patient Name: Wesley [name]       Date: 07/18/2015 DOB: 1945-10-04  Age: 70 y.o. 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  Did you get the word " I'm going home"  Focus of care is comfort, decision is for home with hospice.  Questions and concerns addressed.   Family encouraged to call with questions or concerns.  PMT will continue to support holistically.     Length of Stay: 11 days  Current Medications: Scheduled Meds:  . allopurinol  100 mg Oral Daily  . amLODipine  5 mg Oral Daily  . bisacodyl  10 mg Rectal Once  . dexamethasone  4 mg Oral 4 times per day  . feeding supplement (PRO-STAT SUGAR FREE 64)  30 mL Oral TID WC  . finasteride  5 mg Oral Daily  . hydrALAZINE  50 mg Oral TID  . polyethylene glycol  17 g Oral BID  . senna  2 tablet Oral BID  . sodium chloride flush  10-40 mL Intracatheter Q12H  . sodium chloride flush  3 mL Intravenous Q12H  . tamsulosin  0.4 mg Oral BID    Continuous Infusions:    PRN Meds: albuterol, bisacodyl, hydrALAZINE, morphine CONCENTRATE, ondansetron **OR** [DISCONTINUED] ondansetron (ZOFRAN) IV, sodium chloride flush, traMADol  Physical Exam: Physical Exam  Constitutional: He appears ill.  Cardiovascular: Normal rate, regular rhythm and normal heart sounds.   Abdominal: He exhibits distension. There is tenderness.  Skin: Skin is warm and dry.     Vital Signs: BP 116/75 mmHg  Pulse 120  Temp(Src) 97 F (36.1 C) (Oral)  Resp 18  Ht '5\' 8"'$  (1.727 m)  Wt 84.278 kg (185 lb 12.8 oz)  BMI 28.26 kg/m2  SpO2 99% SpO2: SpO2: 99 % O2 Device: O2 Device: Not Delivered O2 Flow Rate: O2 Flow Rate (L/min): 2 L/min  Intake/output summary:   Intake/Output Summary (Last 24 hours) at 07/18/15 0507 Last data filed at 07/18/15 0225  Gross per 24 hour  Intake  700 ml  Output    700 ml  Net      0 ml   LBM: Last BM Date: 07/15/15 Baseline Weight: Weight: 88.4 kg (194 lb 14.2 oz) Most recent weight: Weight: 84.278 kg (185 lb 12.8 oz)       Palliative Assessment/Data: Flowsheet Rows        Most Recent Value   Intake Tab    Referral Department  Hospitalist   Unit at Time of Referral  Oncology Unit   Palliative Care Primary Diagnosis  Cancer   Date Notified  07/11/15   Palliative Care Type  Return patient Palliative Care   Reason for referral  Non-pain Symptom, Clarify Goals of Care   Date of Admission  07/07/15   Date first seen by Palliative Care  07/12/15   # of days IP prior to Palliative referral  4   Clinical Assessment    Palliative Performance Scale Score  40%   Pain Max last 24 hours  5   Pain Min Last 24 hours  4   Dyspnea Max Last 24 Hours  4   Dyspnea Min Last 24 hours  3   Psychosocial & Spiritual Assessment    Palliative Care Outcomes    Patient/Family meeting held?  Yes   Who was at the meeting?  patient    Palliative Care Outcomes  Provided advance care planning, Clarified goals of care      Additional Data Reviewed: CBC    Component Value Date/Time   WBC 6.6 07/16/2015 0540   WBC 4.9 07/03/2015 0936   RBC 2.57* 07/16/2015 0540   RBC 2.29* 07/03/2015 0936   HGB 8.3* 07/16/2015 0540   HGB 7.5* 07/03/2015 0936   HCT 24.7* 07/16/2015 0540   HCT 22.9* 07/03/2015 0936   PLT 157 07/16/2015 0540   PLT 223 07/03/2015 0936   MCV 96.1 07/16/2015 0540   MCV 99.9* 07/03/2015 0936   MCH 32.3 07/16/2015 0540    MCH 32.7 07/03/2015 0936   MCHC 33.6 07/16/2015 0540   MCHC 32.8 07/03/2015 0936   RDW 16.9* 07/16/2015 0540   RDW 19.2* 07/03/2015 0936   LYMPHSABS 0.8 07/07/2015 0910   LYMPHSABS 0.4* 07/03/2015 0936   MONOABS 0.5 07/07/2015 0910   MONOABS 1.2* 07/03/2015 0936   EOSABS 0.1 07/07/2015 0910   EOSABS 0.1 07/03/2015 0936   BASOSABS 0.0 07/07/2015 0910   BASOSABS 0.1 07/03/2015 0936    CMP     Component Value Date/Time   NA 131* 07/17/2015 0615   NA 131* 07/03/2015 0936   K 4.4 07/17/2015 0615   K 4.8 07/03/2015 0936   CL 95* 07/17/2015 0615   CO2 21* 07/17/2015 0615   CO2 20* 07/03/2015 0936   GLUCOSE 122* 07/17/2015 0615   GLUCOSE 150* 07/03/2015 0936   BUN 98* 07/17/2015 0615   BUN 20.8 07/03/2015 0936   CREATININE 3.00* 07/17/2015 0615   CREATININE 2.7* 07/03/2015 0936   CALCIUM 9.6 07/17/2015 0615   CALCIUM 9.1 07/03/2015 0936   PROT 9.0* 07/09/2015 0400   PROT 8.9* 07/03/2015 0936   ALBUMIN 3.4* 07/17/2015 0615   ALBUMIN 3.3* 07/03/2015 0936   AST 21 07/09/2015 0400   AST 14 07/03/2015 0936   ALT 14* 07/09/2015 0400   ALT 10 07/03/2015 0936   ALKPHOS 36* 07/09/2015 0400   ALKPHOS 44 07/03/2015 0936   BILITOT 0.6 07/09/2015 0400   BILITOT 1.04 07/03/2015 0936   GFRNONAA 20* 07/17/2015 0615   GFRAA 23* 07/17/2015 0615  Problem List:  Patient Active Problem List   Diagnosis Date Noted  . DNR (do not resuscitate) discussion   . Bilateral pleural effusion   . Constipation   . Hyponatremia   . Fecal impaction ([place])   . Palliative care encounter   . Counseling regarding advanced directives and goals of care   . Status post thoracentesis   . Acute respiratory failure with hypoxia ([place]) 07/08/2015  . 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 Acute renal failure superimposed on stage 3 chronic kidney disease ([place]) 07/08/2015  . Multiple myeloma ([place])   . Dyspnea 07/07/2015  . Pleural effusion 07/07/2015  . Metastatic cancer ([place]) 07/07/2015  .  Lymphadenopathy, supraclavicular 07/03/2015  . Encounter for antineoplastic chemotherapy 05/12/2015  . UTI (urinary tract infection) due to Enterococcus and pseudomonas  03/24/2015  . Sepsis due to Pseudomonas UTI and bacteremia ([place]) 03/24/2015  . Enlarged prostate with urinary obstruction 03/24/2015  . Essential hypertension   . 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 CKD (chronic kidney disease) stage 5, GFR less than 15 ml/min ([facility]) 02/19/2015  . Antineoplastic chemotherapy induced anemia 02/19/2015  . Bilateral hydronephrosis 02/18/2015  . 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     Palliative Care Assessment & Plan    1.Code Status:  DNR/DNI-documemted today       Code Status Orders        Start     Ordered   07/07/15 1331  Full code   Continuous     07/07/15 1330    Code Status History    Date Active Date Inactive Code Status Order ID Comments User Context   03/20/2015  7:22 PM 03/25/2015  2:47 PM Full Code [id number]  Theodis Blaze, MD Inpatient   02/17/2015  4:52 PM 02/23/2015  4:19 PM Full Code [id number]  Theodis Blaze, MD Inpatient   10/22/2014  8:52 AM 10/22/2014  1:09 PM Full Code [id number]  Milly Jakob, MD Inpatient   10/02/2014  9:48 AM 10/03/2014  3:19 AM Full Code [id number]  Arne Cleveland, MD HOV   11/14/2013 10:03 AM 11/15/2013  3:30 AM Full Code [id number]  Markus Daft, MD HOV       2. Goals of Care/Additional Recommendations:  Focus of care is comfort.   Psycho-social Needs: Caregiving  Support/Resources, Hospice benefit  3. Symptom Management:       1. Pain: utilizing Tramadol with acceptable pain control.   Continued discussion regarding utilization of medication to maximize comfort and promote function, I anticipate patient's symptom load will increase  Will write for Roxanol and Ativan for anticipated needs at home  4. Palliative Prophylaxis:   Bowel Regimen  5. Prognosis: days to weeks  6. Discharge Planning:   Home with hospice, hopefull  today   Care plan was discussed with Dr Tat  Thank you for allowing the Palliative Medicine Team to assist in the care of this patient.   Time In: 0705 Time Out:  0730 Total Time  25 min Prolonged Time Billed  no        [address] [place], NP  07/18/2015, 5:07 AM  Please contact Palliative Medicine Team phone at [phone] for questions and concerns.

## 2015-07-18 NOTE — Progress Notes (Signed)
Notified by [place] of family request for Hospice and [place] services at home after discharge. Chart and patient information currently under review to confirm hospice eligibility.   Spoke with patient and wife at bedside to initiate education related to hospice philosophy, services and team approach to care. Family verbalized understanding of the information provided. Per discussion, plan is for discharge to home by personal vehicle with wife today.  Please send signed completed DNR form home with patient.  Patient will need prescriptions for discharge comfort medications.   DME needs discussed and family requested hospital bed, OBT, walker, W/C, 3 N1 and tubseat for delivery to the home today.  [facility] equipment manager Jewel Ysidro Evert notified and will contact [place] to arrange delivery to the home.  The home address has been verified and is correct in the chart; [place] Go family member to be contacted to arrange time of delivery.   [facility] Referral Center aware of the above.  Completed discharge summary will need to be faxed to [facility] at [phone] when final.  Please notify [facility] when patient is ready to leave unit at discharge-call [phone].  [facility] information and contact numbers have been given to patient during visit.   Please call with any questions.  Thank You,  Freddi Starr RN, [facility] Liaison  [phone]

## 2015-07-18 NOTE — Telephone Encounter (Signed)
Called to [facility] that Wesley [name] will be attending.

## 2015-07-19 ENCOUNTER — Ambulatory Visit: Payer: Medicare HMO

## 2015-07-22 ENCOUNTER — Other Ambulatory Visit: Payer: Self-pay | Admitting: General Surgery

## 2015-07-22 ENCOUNTER — Ambulatory Visit: Payer: Medicare HMO

## 2015-07-23 ENCOUNTER — Telehealth: Payer: Self-pay | Admitting: Medical Oncology

## 2015-07-23 ENCOUNTER — Other Ambulatory Visit (HOSPITAL_COMMUNITY): Payer: Medicare HMO

## 2015-07-23 ENCOUNTER — Encounter: Payer: Self-pay | Admitting: Internal Medicine

## 2015-07-23 ENCOUNTER — Ambulatory Visit: Payer: Medicare HMO

## 2015-07-23 ENCOUNTER — Ambulatory Visit (HOSPITAL_COMMUNITY): Payer: Medicare HMO

## 2015-07-23 ENCOUNTER — Telehealth: Payer: Self-pay | Admitting: *Deleted

## 2015-07-24 ENCOUNTER — Other Ambulatory Visit: Payer: Medicare HMO

## 2015-07-24 ENCOUNTER — Ambulatory Visit: Payer: Medicare HMO

## 2015-07-28 NOTE — Progress Notes (Signed)
Radiation Oncology         [phone] ________________________________  Name: Wesley [name] MRN: [id number]  Date: 07/16/2015  DOB: 08-07-1945  End of Treatment Note  Diagnosis:   Multiple myeloma in relapse     Indication for treatment:  Breathing difficulties, painful right groin mass, left perinephric mass with hydronephrosis       Radiation treatment dates:   07/10/2015-07/16/2015  Site/dose:   10 gray to anterior chest, right groin mass and left perinephric mass  Beams/energy:   3-D conformal  Narrative: The patient tolerated radiation treatment relatively well.   Patient completed an abbreviated course of radiation therapy. He was initially scheduled to receive 20 gray to the areas however after 10 gray patient elected to discontinue his therapy and proceed with hospice/palliative care  Plan: prn followup -----------------------------------  Blair Promise, PhD, MD

## 2015-07-31 ENCOUNTER — Other Ambulatory Visit: Payer: Medicare HMO

## 2015-08-03 NOTE — Telephone Encounter (Signed)
Called [facility] and cancelled revlimid/pomalyst and deactivated pt from program.

## 2015-08-03 NOTE — Telephone Encounter (Signed)
Hospice RN called pt passed this am Aug 14, 2015

## 2015-08-03 NOTE — Progress Notes (Signed)
Received signed physician form for [facility]. Faxed form to [facility]. Fax received ok per confirmation sheet.

## 2015-08-03 DEATH — deceased

## 2015-10-02 ENCOUNTER — Ambulatory Visit: Payer: Medicare HMO | Admitting: Internal Medicine

## 2015-10-21 ENCOUNTER — Other Ambulatory Visit: Payer: Self-pay | Admitting: Nurse Practitioner

## 2016-07-26 IMAGING — DX DG CHEST 1V PORT
1 series · 1 of 1 positions shown · non-contrast
Comparison: Portable chest x-ray July 07, 2015

CLINICAL DATA: Follow-up pleural effusion, respiratory failure,
metastatic malignancy, acute and chronic renal failure.

EXAM:
PORTABLE CHEST 1 VIEW

[chest ap]
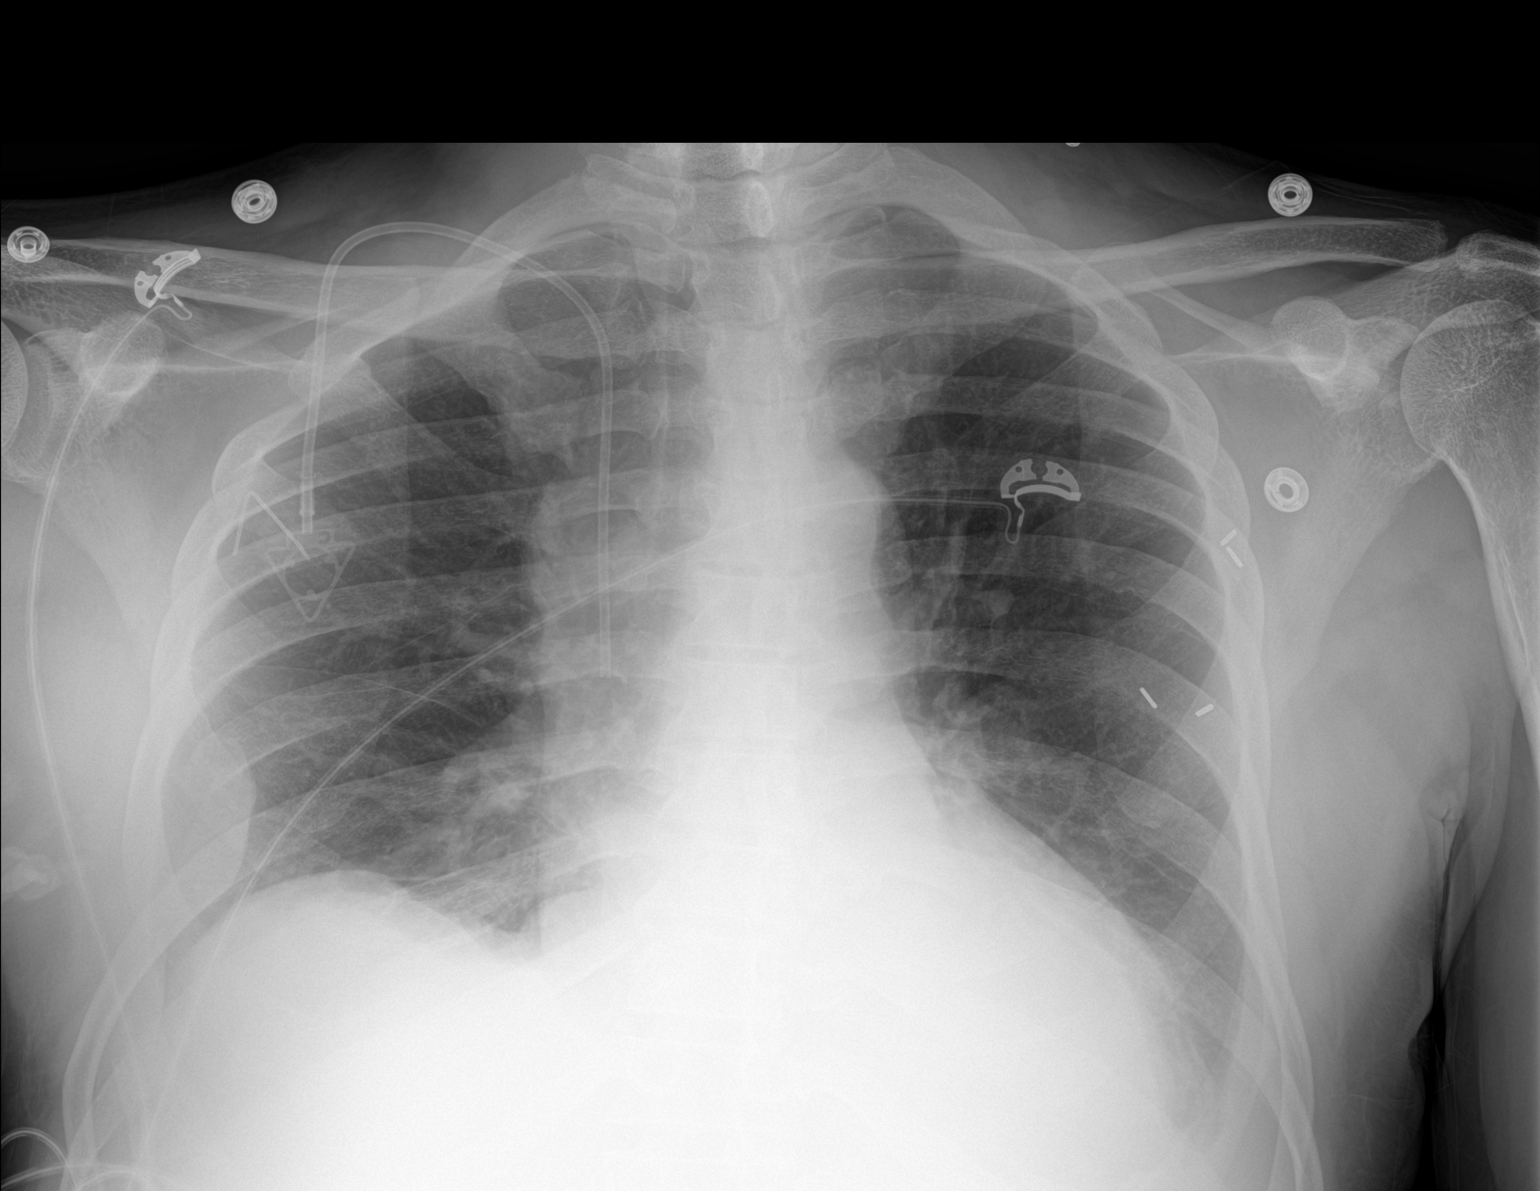

[1 of 1 positions shown; findings below may reference images not displayed]

FINDINGS: The small pleural effusion on the right is again demonstrated.
Inflation of the right lung is improved. There is soft tissue
density noted along the lateral thoracic wall with loss of the
contour of the right seventh rib. The left lung is well-expanded.
Increased density at the left lung base persists. There is a small
left pleural effusion. The cardiac silhouette remains enlarged. The
pulmonary vascularity is not engorged. The Port-A-Cath appliance tip
projects over the midportion of the SVC. There are surgical clips
overlying the left lateral chest wall.
IMPRESSION: Stable appearance of small bilateral pleural effusions. Left-sided
basilar atelectasis is somewhat less conspicuous. Lysis of the
lateral aspect of the right seventh rib is better demonstrated
today.

## 2016-07-29 IMAGING — DX DG CHEST 2V
2 series · 2 of 2 positions shown · non-contrast
Comparison: 07/10/2015; 07/07/2015; chest CT - 07/07/2015

CLINICAL DATA: History of myeloma and bladder cancer. Evaluate
bilateral pleural effusions

EXAM:
CHEST  2 VIEW

[chest pa]
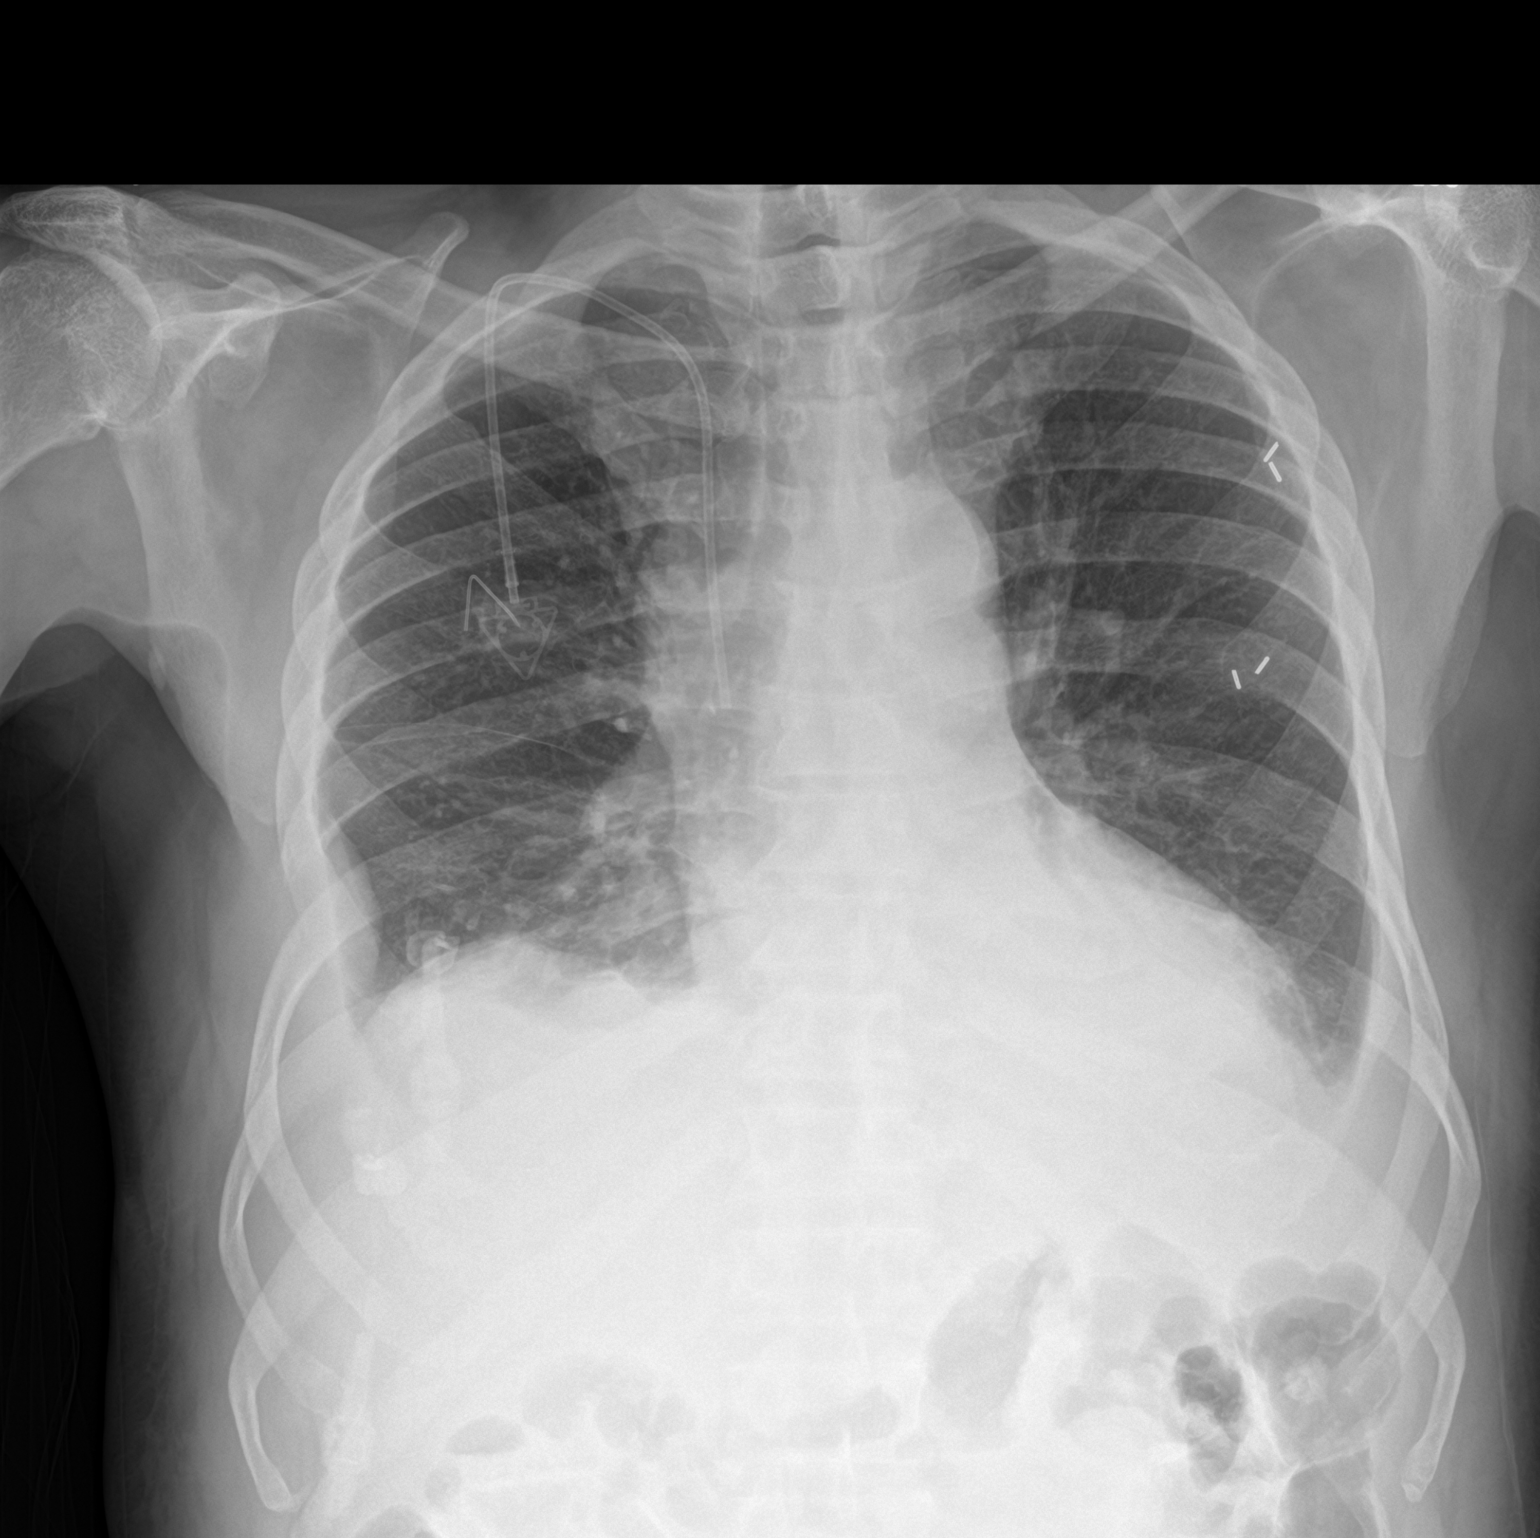

[chest lat]
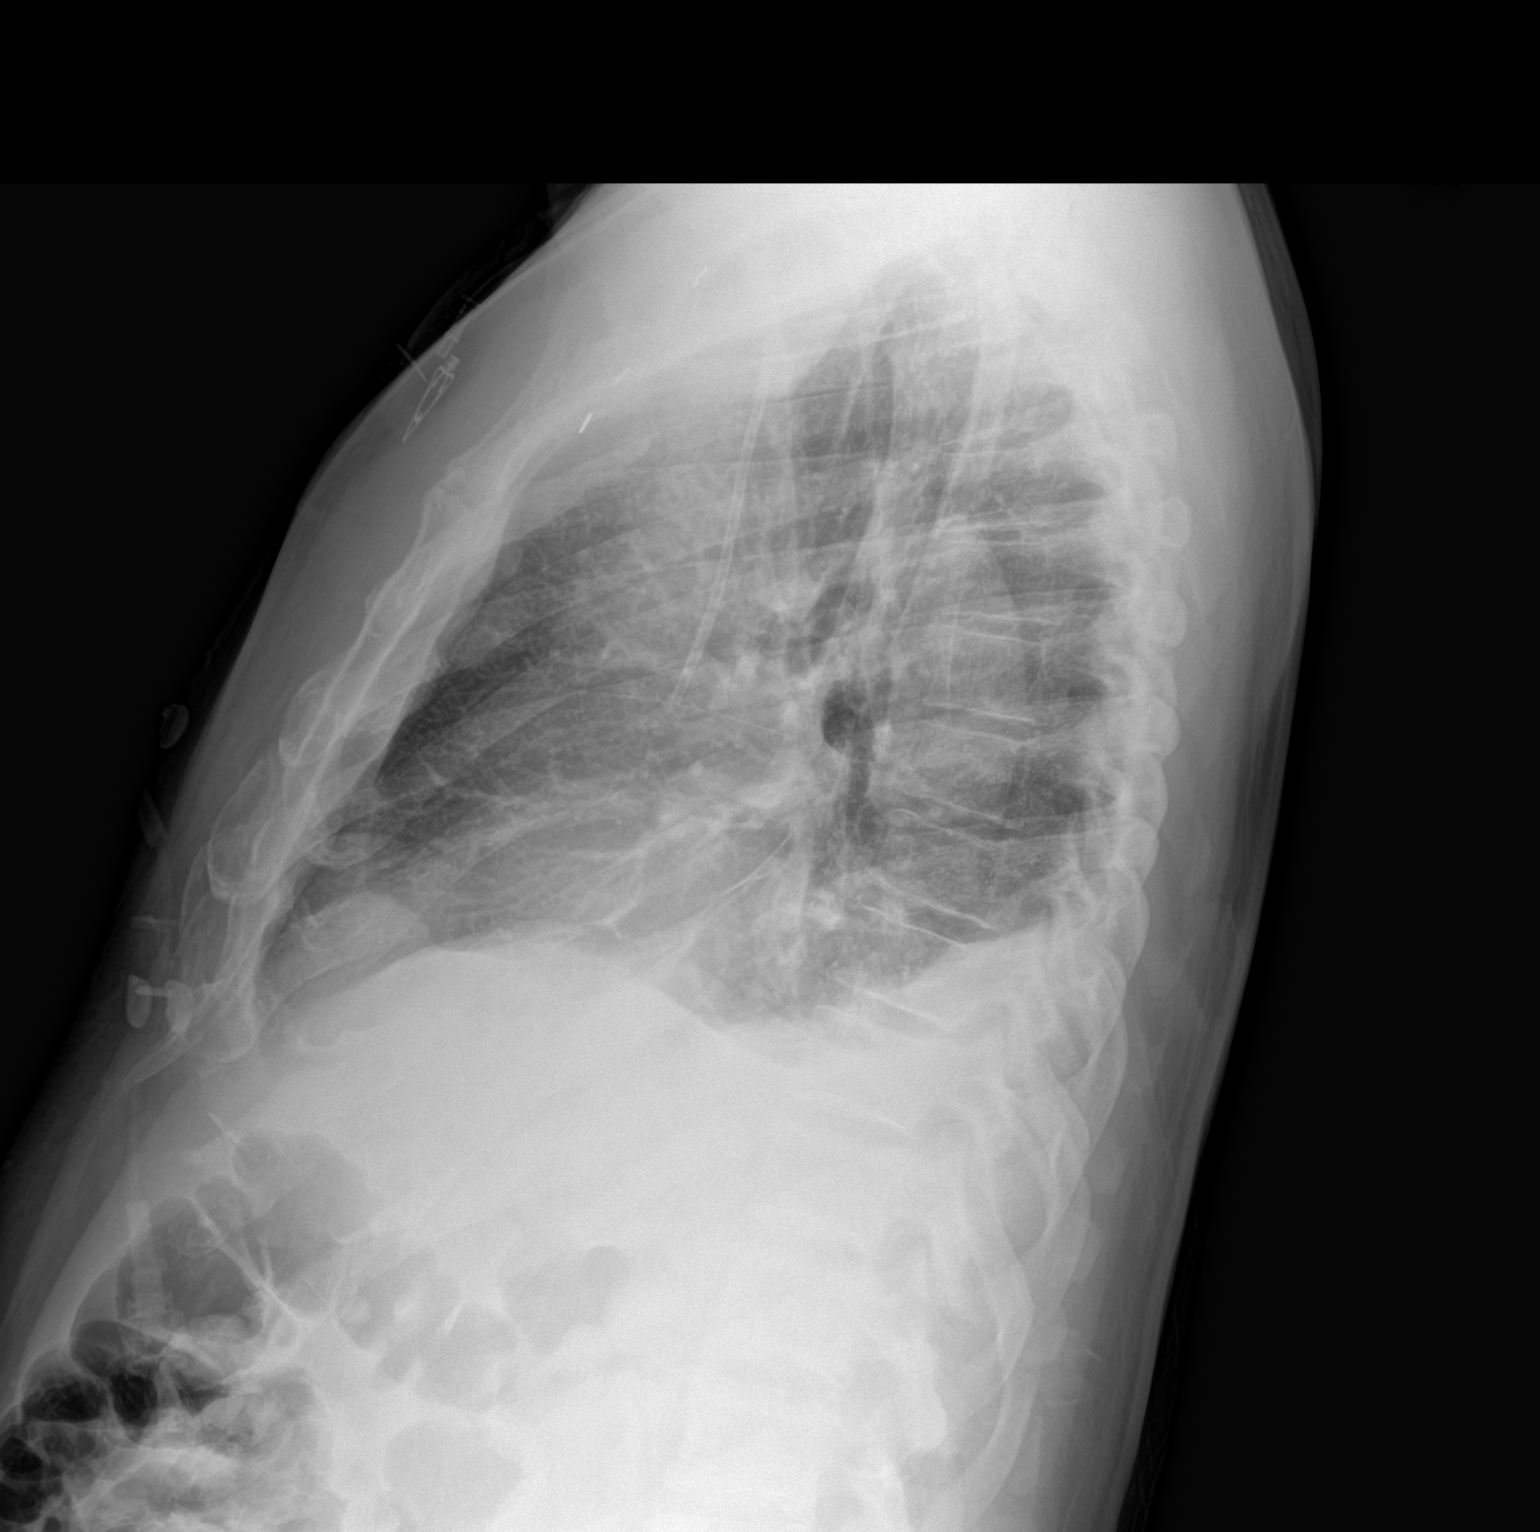

[2 of 2 positions shown; findings below may reference images not displayed]

FINDINGS: Grossly unchanged cardiac silhouette and mediastinal contours with
persistent thickening of the bilateral paratracheal stripe secondary
to known mediastinal lymphadenopathy. Small right and trace
left-sided pleural effusions are unchanged. Unchanged bibasilar
heterogeneous/consolidative opacities. No new focal airspace
opacities. No definite evidence of edema. Surgical clips overlie the
left axilla. No acute osseus abnormalities.
IMPRESSION: 1. Grossly unchanged small right and trace left-sided pleural
effusions with associated bibasilar opacities, likely atelectasis.
2. Similar findings compatible with known mediastinal adenopathy.

## 2016-07-29 IMAGING — DX DG ABD PORTABLE 1V
2 series · 2 of 2 positions shown · non-contrast
Comparison: CT 07/07/2015

CLINICAL DATA: Myelopathy on a.  Abdominal pain and constipation.

EXAM:
PORTABLE ABDOMEN - 1 VIEW

[abdomen kub (1 of 2)]
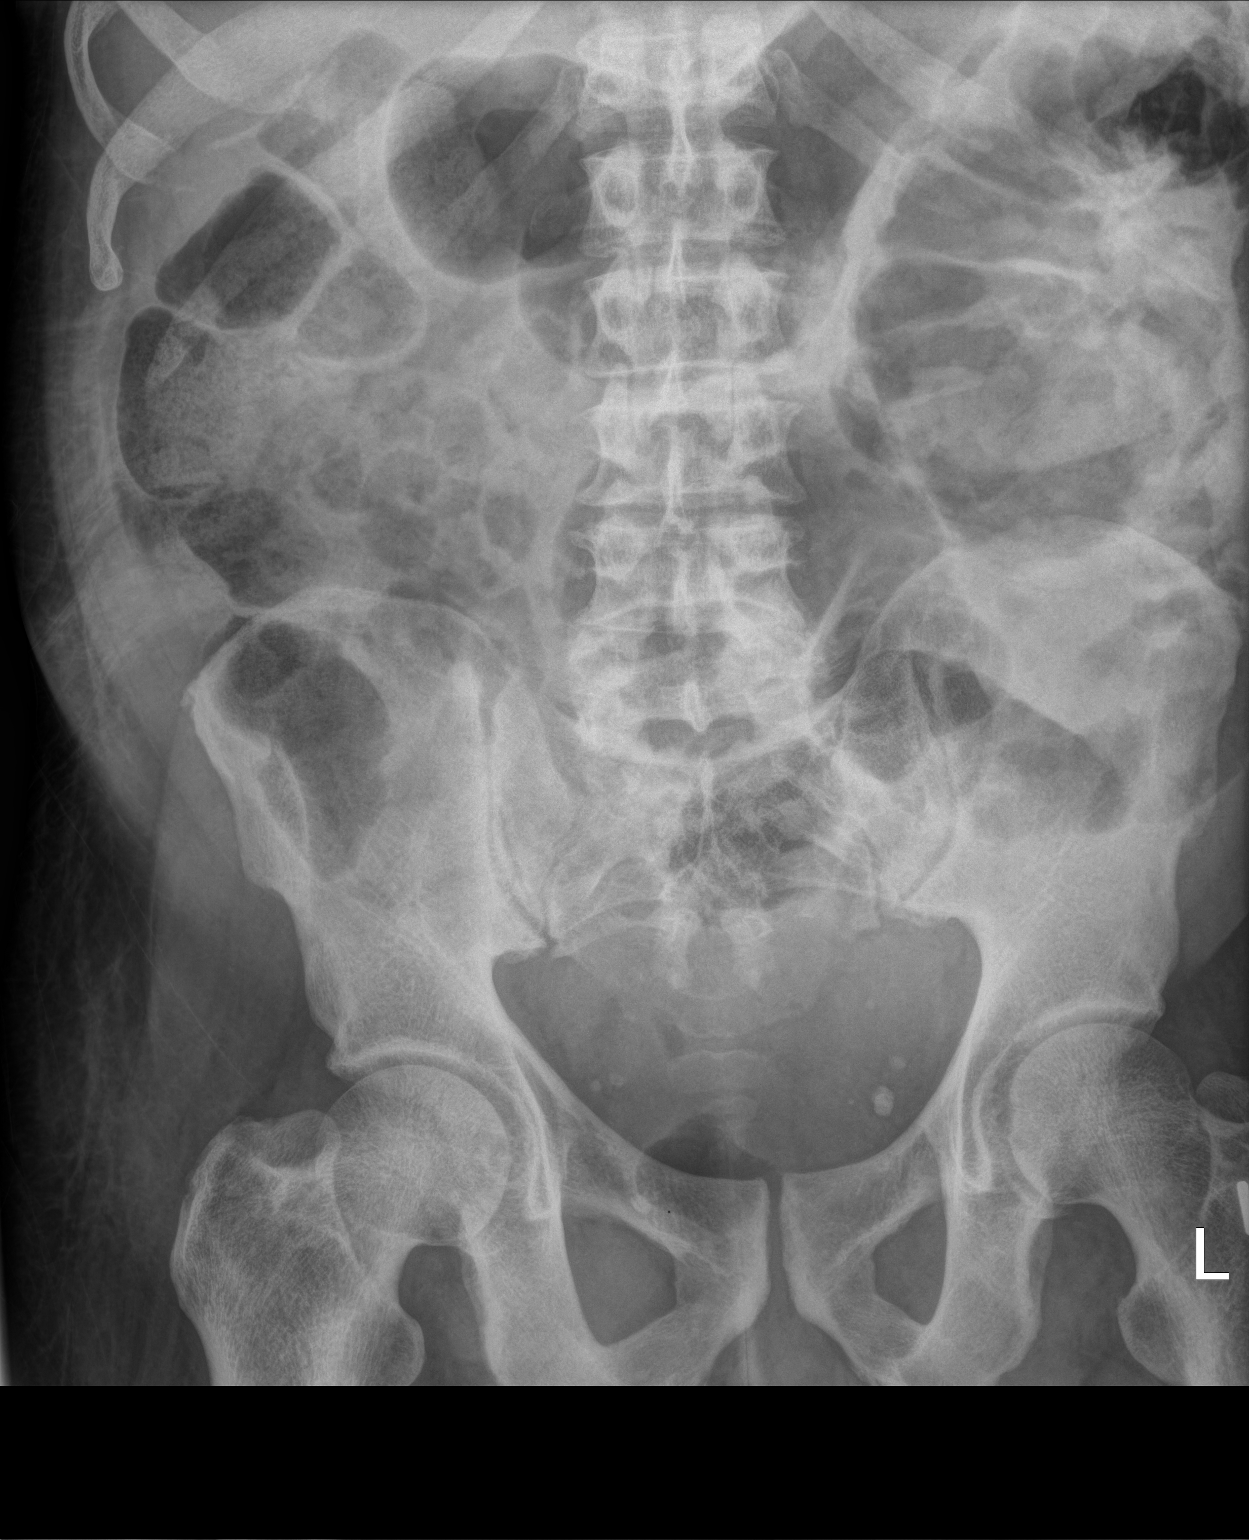

[abdomen kub (2 of 2)]
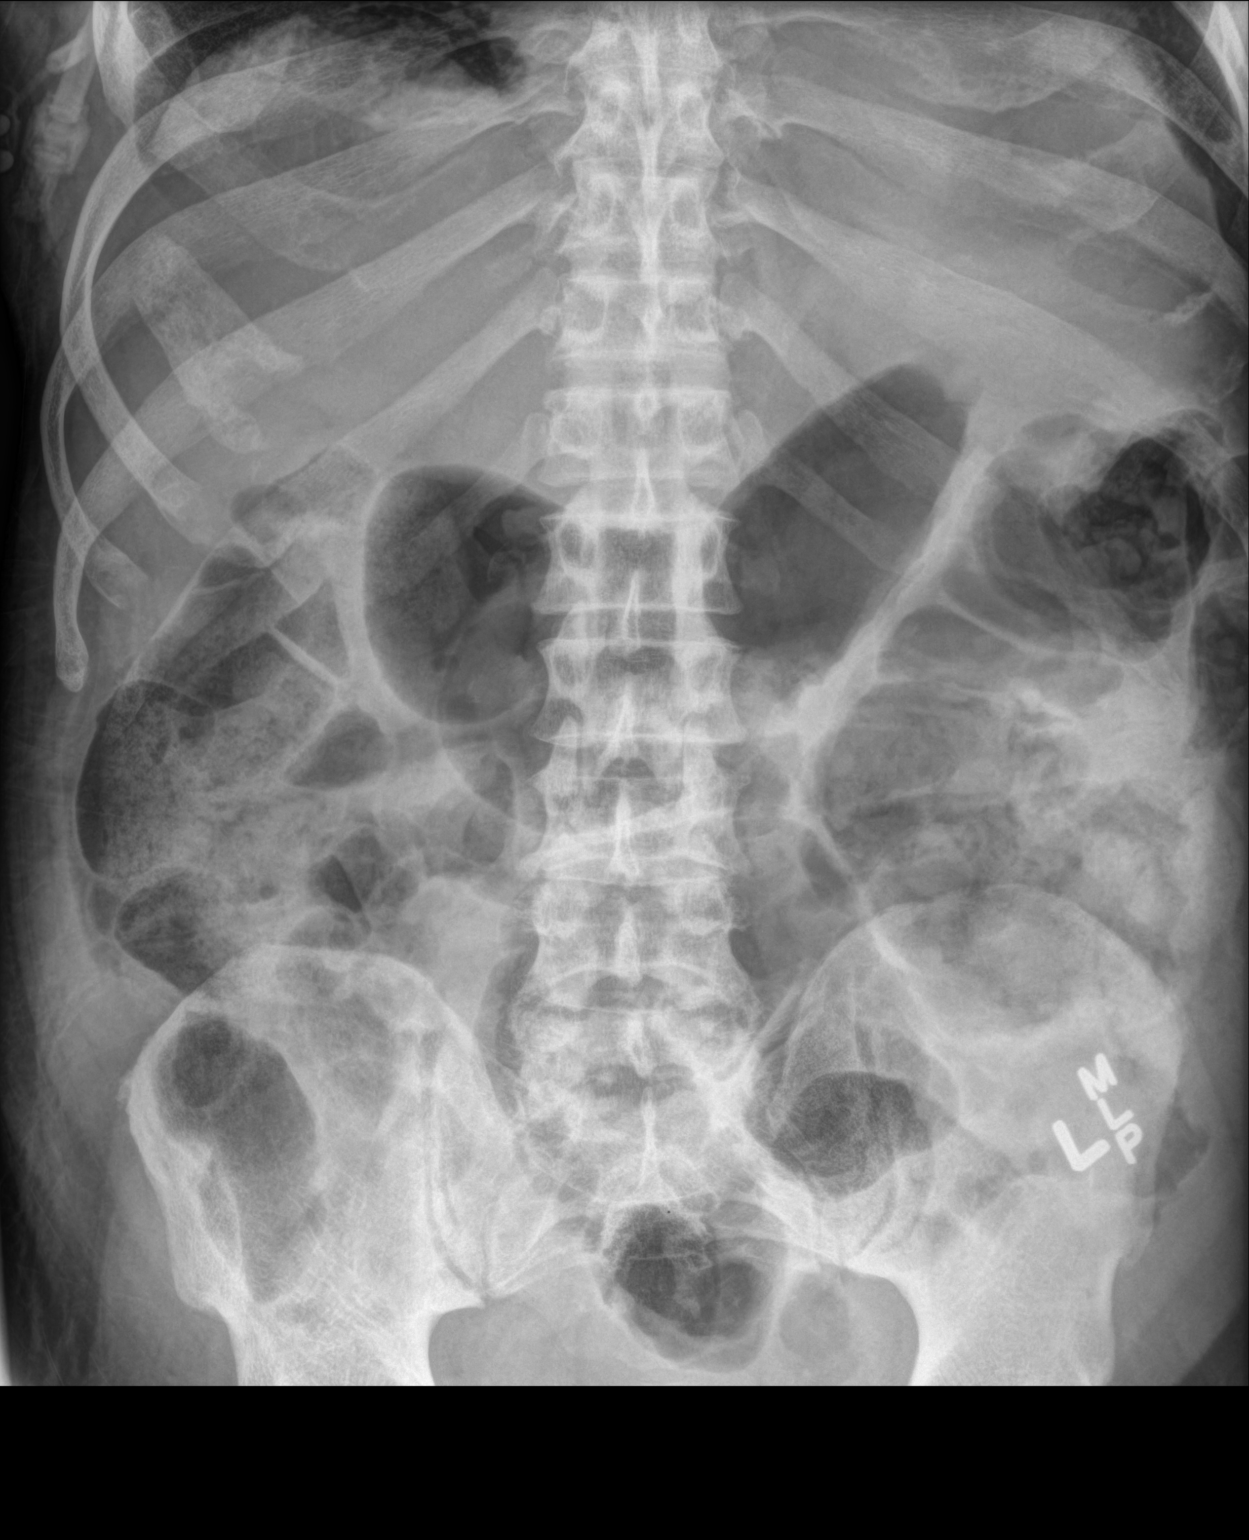

[2 of 2 positions shown; findings below may reference images not displayed]

FINDINGS: There is large amount of gas and fecal matter throughout the colon.
Moderate amount of small bowel gas. No free air seen. No acute bone
finding.
IMPRESSION: Large amount of fecal matter and gas within the colon consistent
with the clinical suspicion of constipation. No free air seen.
